# Patient Record
Sex: Male | Born: 1965
Health system: Southern US, Community
[De-identification: ages and names within clinical notes are randomized; demographics above are authoritative.]

## PROBLEM LIST (undated history)

## (undated) DIAGNOSIS — R079 Chest pain, unspecified: Secondary | ICD-10-CM

## (undated) DIAGNOSIS — E785 Hyperlipidemia, unspecified: Secondary | ICD-10-CM

## (undated) DIAGNOSIS — E669 Obesity, unspecified: Secondary | ICD-10-CM

## (undated) DIAGNOSIS — Z87442 Personal history of urinary calculi: Secondary | ICD-10-CM

## (undated) DIAGNOSIS — M1990 Unspecified osteoarthritis, unspecified site: Secondary | ICD-10-CM

## (undated) DIAGNOSIS — I10 Essential (primary) hypertension: Secondary | ICD-10-CM

## (undated) HISTORY — DX: Essential (primary) hypertension: I10

## (undated) HISTORY — DX: Obesity, unspecified: E66.9

## (undated) HISTORY — DX: Hyperlipidemia, unspecified: E78.5

## (undated) HISTORY — PX: UMBILICAL HERNIA REPAIR: SHX196

## (undated) HISTORY — DX: Chest pain, unspecified: R07.9

---

## 1999-11-27 ENCOUNTER — Observation Stay (HOSPITAL_COMMUNITY): Admission: EM | Admit: 1999-11-27 | Discharge: 1999-11-29 | Payer: Self-pay | Admitting: Emergency Medicine

## 2002-05-01 ENCOUNTER — Inpatient Hospital Stay (HOSPITAL_COMMUNITY): Admission: EM | Admit: 2002-05-01 | Discharge: 2002-05-03 | Payer: Self-pay | Admitting: Emergency Medicine

## 2002-05-01 ENCOUNTER — Encounter: Payer: Self-pay | Admitting: Emergency Medicine

## 2002-05-03 ENCOUNTER — Encounter: Payer: Self-pay | Admitting: Internal Medicine

## 2003-07-07 ENCOUNTER — Encounter: Admission: RE | Admit: 2003-07-07 | Discharge: 2003-07-07 | Payer: Self-pay | Admitting: Surgery

## 2003-07-08 ENCOUNTER — Ambulatory Visit (HOSPITAL_BASED_OUTPATIENT_CLINIC_OR_DEPARTMENT_OTHER): Admission: RE | Admit: 2003-07-08 | Discharge: 2003-07-08 | Payer: Self-pay | Admitting: Surgery

## 2003-07-08 ENCOUNTER — Ambulatory Visit (HOSPITAL_COMMUNITY): Admission: RE | Admit: 2003-07-08 | Discharge: 2003-07-08 | Payer: Self-pay | Admitting: Surgery

## 2004-05-25 ENCOUNTER — Emergency Department (HOSPITAL_COMMUNITY): Admission: EM | Admit: 2004-05-25 | Discharge: 2004-05-25 | Payer: Self-pay | Admitting: Emergency Medicine

## 2007-08-03 ENCOUNTER — Emergency Department (HOSPITAL_COMMUNITY): Admission: EM | Admit: 2007-08-03 | Discharge: 2007-08-03 | Payer: Self-pay | Admitting: Emergency Medicine

## 2007-10-15 ENCOUNTER — Emergency Department (HOSPITAL_COMMUNITY): Admission: EM | Admit: 2007-10-15 | Discharge: 2007-10-15 | Payer: Self-pay | Admitting: Emergency Medicine

## 2010-04-23 ENCOUNTER — Emergency Department (HOSPITAL_COMMUNITY): Admission: EM | Admit: 2010-04-23 | Discharge: 2010-04-23 | Payer: Self-pay | Admitting: Emergency Medicine

## 2010-06-05 ENCOUNTER — Emergency Department (HOSPITAL_COMMUNITY)
Admission: EM | Admit: 2010-06-05 | Discharge: 2010-06-05 | Payer: Self-pay | Source: Home / Self Care | Admitting: Emergency Medicine

## 2010-06-17 ENCOUNTER — Encounter
Admission: RE | Admit: 2010-06-17 | Discharge: 2010-07-06 | Payer: Self-pay | Source: Home / Self Care | Attending: Orthopaedic Surgery | Admitting: Orthopaedic Surgery

## 2010-08-16 LAB — URINALYSIS, ROUTINE W REFLEX MICROSCOPIC
Bilirubin Urine: NEGATIVE
Glucose, UA: >1000 mg/dL — AB
Ketones, ur: NEGATIVE mg/dL
Nitrite: NEGATIVE
Protein, ur: 30 mg/dL — AB
Specific Gravity, Urine: 1.016 (ref 1.005–1.030)
Urobilinogen, UA: 0.2 mg/dL (ref 0.0–1.0)
pH: 6.5 (ref 5.0–8.0)

## 2010-08-16 LAB — GLUCOSE, CAPILLARY: Glucose-Capillary: 247 mg/dL — ABNORMAL HIGH (ref 70–99)

## 2010-08-16 LAB — URINE MICROSCOPIC-ADD ON

## 2010-10-22 NOTE — Discharge Summary (Signed)
[place]  Patient:    Martin [name], Martin [name]                     MRN: [id number] Adm. Date:  [id number] Disc. Date: [id number] Attending:  Phifer, Harriett Sine [place] Dictator:   Karlene Einstein, M.D. CC:         Karlene Einstein, M.D.                           Discharge Summary  DISCHARGE DIAGNOSES: 1. Right lower extremity cellulitis. 2. Umbilical hernia.  PROCEDURES:  None.  DISCHARGE MEDICATIONS:  Augmentin 875 mg p.o. q.12h. for 8 days.  CONSULTANTS:  None.  FOLLOW-UP:  Karlene Einstein, M.D., at [facility] on December 07, 1999, at 4:25 p.m., for follow-up cellulitis and hemoglobin level.  HISTORY OF PRESENT ILLNESS:  A 45 year old white male with no past medical history presented with severe nausea, redness, and aching, and heat in his right lower extremity for one day.  Positive fever of 102.5.  No itching. Denies chills, night sweats, headache, shortness of breath, chest pain, abdominal pain, diarrhea, or other complaints.  Patient had two prior episodes in the same area before.  Both resolved within five days with the use of Benadryl and cortisone.  PHYSICAL EXAMINATION:  VITAL SIGNS:  Temperature 100.6, blood pressure 123/70, pulse 88, respiratory rate 20.  GENERAL:  No acute distress.  HEENT:  Normocephalic and atraumatic.  Pupils are equal, round, and reactive to light and accommodation.  Extraocular muscles intact.  Oropharynx clear.  NECK:  Supple.  No lymphadenopathy.  HEART:  Regular rate and rhythm.  Normal S1 and S2.  No murmurs, gallops, or rubs.  ABDOMEN:  Soft, nontender, nondistended, positive bowel sounds.  EXTREMITIES:  No edema.  Diminished pulses.  Right lower extremity anterior cough, warm, erythematous, nontender.  NEUROLOGIC:  Alert and oriented x 3.  No focal deficits.  LABORATORY DATA:  White count 14.4, hemoglobin 14, platelets 207, sodium 134, potassium 3.7, chloride 99, bicarbonate 28,  BUN 11, creatinine 0.8, glucose 110, ANC 12.6, calcium 8.8, PT 13.9, INR 1.2, PTT 27.  HOSPITAL COURSE: #1 - RIGHT LOWER EXTREMITY CELLULITIS:  Patient was started on Ancef 1 g IV q.8h. and he improved over two days.  He was afebrile.  No further nausea.  He was discharged on  Augmentin 875 mg p.o. q.12h. for eight more days and follow up as stated above.  #2 - ANEMIA:  His repeat hemoglobin was 12.9, MCV 83.  He will be followed up in the outpatient clinic for a repeat hemoglobin.  His CMET was normal. DD:  11/29/99 TD:  11/29/99 Job: [id number]

## 2010-10-22 NOTE — Op Note (Signed)
NAME:  Martin [name], Martin [name]                        ACCOUNT NO.:  [id number]   MEDICAL RECORD NO.:  [id number]                   PATIENT TYPE:  AMB   LOCATION:  DSC                                  FACILITY:  [facility]   PHYSICIAN:  Velora Heckler, M.D.                DATE OF BIRTH:  [birth year]   DATE OF PROCEDURE:  07/08/2003  DATE OF DISCHARGE:                                 OPERATIVE REPORT   PREOPERATIVE DIAGNOSIS:  Incarcerated umbilical hernia.   POSTOPERATIVE DIAGNOSIS:  Incarcerated umbilical hernia.   PROCEDURE:  1. Repair incarcerated umbilical hernia with Prolene mesh.  2. Partial omentectomy.   SURGEON:  Velora Heckler, M.D.   ANESTHESIA:  General per Dr. Adonis Huguenin   ESTIMATED BLOOD LOSS:  Minimal.   PREPARATION:  Betadine.   COMPLICATIONS:  None.   INDICATIONS FOR PROCEDURE:  The patient is a 46 year old white male with  long-standing incarcerated umbilical hernia.  This has gradually increased  in size.  It causes him intermittent discomfort.  He has had no signs of  intestinal obstruction.  He now comes to surgery for repair.   DESCRIPTION OF PROCEDURE:  The procedure was done in OR 3 at the [facility].  The patient was brought to the operating room and placed in  a supine position on the operating table.  Following administration of  general anesthesia, the patient was prepped and draped in the usual strict  aseptic fashion.  After ascertaining an adequate level of anesthesia had  been obtained, an infraumbilical incision was made transversely with a #10  blade.  Dissection was carried down through the skin and subcutaneous  tissue.  There is a large hernia sac present.  This contains incarcerated  omentum.  The hernia sac is carefully dissected out using the electrocautery  for hemostasis.  The hernia sac is opened.  A portion of the hernia sac is  allowed to remain adherent to the under surface of the umbilicus so as not  to devascularize a thin  wall umbilicus.  The hernia sac is dissected out to  the level of the fascia.  The fascial defect measures only approximately 3  cm in size.  However, the hernia sac measures up to 15 cm in greatest  dimension.  Portions of the hernia sac are excised using the electrocautery.  The incarcerated omentum is so large as to be unable to reduce back into the  peritoneal cavity without significantly opening the fascial defect.  Therefore, decision was made to proceed with partial omentectomy.  The  omentum is divided between Jeanerette clamps and ligated with 2-0 silk ties.  Approximately 60% of the omentum is resected.  The remaining omentum is then  reduced through the hernia defect after enlarging the hernia defect slightly  by incising the fascia in the midline cephalad.  The omentum is completely  reduced in the peritoneal cavity and  the remaining hernia sac is excised.  The fascial defect is closed with #1 Novafil sutures.  These are simple  interrupted transverse sutures.  Next, the repair is reinforced with a 3 by  6 inch sheet of Prolene mesh.  This is trimmed slightly to the appropriate  dimensions.  It is secured circumferentially with alternating #1 Novafil  sutures and 0 Ethibond sutures.  0 Ethibond sutures are also used to secure  the mesh to the midline suture line closure.  The umbilicus was then  reaffixed to the abdominal wall with interrupted 0 Ethibond suture.  The  subcutaneous tissues are closed with interrupted 3-0 Vicryl sutures.  The  skin was closed with a running 4-0 Vicryl subcuticular sutures.  Benzoin and  Steri-Strips were applied.  Sterile dressings were applied.  The patient was  awakened from anesthesia and brought to the recovery room in stable  condition.  The patient tolerated the procedure well.                                               Velora Heckler, M.D.    TMG/MEDQ  D:  07/08/2003  T:  07/08/2003  Job:  [id number]

## 2010-10-22 NOTE — Discharge Summary (Signed)
NAME:  Martin [name], Martin [name] NO.:  [id number]   MEDICAL RECORD NO.:  [id number]                   PATIENT TYPE:  INP   LOCATION:  5038                                 FACILITY:  [facility]   PHYSICIAN:  Sherin Quarry, MD                   DATE OF BIRTH:  1965-11-22   DATE OF ADMISSION:  05/01/2002  DATE OF DISCHARGE:                                 DISCHARGE SUMMARY   HISTORY:  The patient is a 45 year old man who initially presented to the  [facility] Room on 05/01/02, with acute onset over 48  hours of pain in the right lower extremity, with associated erythema which  extended up to an area above the knee.  Temperature was 104.7.  The patient  appeared lethargic and moderately ill.  Physical examination at the time of  admission is described by Dr. Julio Sicks, the HEENT exam was within normal  limits.  The chest was clear.  Cardiovascular exam revealed normal S1, S2  without rubs, murmurs or gallops.  The abdomen was benign.  There were  normal bowel sounds without masses or tenderness.  There was a 4 cm easily  reducible umbilical hernia.  Neurologic testing was within normal limits.  Examination of the extremities revealed erythema of the right lower  extremity extending from the ankle to the knee without any evidence of edema  or subcutaneous air.  Serial blood cultures were obtained which are negative  to date.  VMET revealed a potassium of 3.4, WBC 19,300, hemoglobin 15.  Urinalysis was within normal limits.  On admission the patient was placed on  Ancef 1 gram IV every 8 hours.  He was administered Dilaudid 2 mg IV every 6  hours p.r.n. for pain.  Initially some concern was raised about a possible  underlying abscess, for this reason the patient was sent for an MRI scan of  the right lower extremity, this was done on 05/03/02.  The MRI scan showed  evidence of cellulitis, but there was no signs of any underlying  osteomyelitis,  myonecrosis or abscess formation.  By 05/03/02, the erythema  had not entirely resolved, but was substantially better.  It was felt  reasonable to go ahead and discharge the patient at that time.  At the time  of discharge I made it very clear to the patient that it was essential that  he take all antibiotic medicines until they were all gone.  I also suggested  that he remain off work until the following Wednesday and suggested that he  try to keep his leg elevated as much as possible.  In the past he had worn  surgical stockings when at work to provide venous support, and given that he  has had cellulitis in his leg now on two separate occasions, I urged him to  resume this practice.   DISCHARGE MEDICATIONS:  1. Augmentin 875  mg p.o. b.i.d. with food.  2. Darvocet 1-2 every 4 hours p.r.n. for pain.   FOLLOWUP:  The patient does not have a primary care doctor, but his wife  apparently sees the doctors at [facility], and I suggested  that they might be a good group of doctors for him to be seen by in the  future.  He promised he will make an appointment with that group of doctors.   DISCHARGE CONDITION:  Good.    DISCHARGE DIAGNOSES:  1. Right lower extremity cellulitis.  2. Obesity.  3. Past history of cellulitis of the leg.                                               Sherin Quarry, MD    SY/MEDQ  D:  05/03/2002  T:  05/03/2002  Job:  [id number]   cc:   Teena Irani. Arlyce Dice, M.D.  [address]  [place]  [place] [address]  Fax: [phone]

## 2010-11-30 DIAGNOSIS — M7989 Other specified soft tissue disorders: Secondary | ICD-10-CM | POA: Insufficient documentation

## 2010-11-30 DIAGNOSIS — M791 Myalgia, unspecified site: Secondary | ICD-10-CM | POA: Insufficient documentation

## 2010-11-30 DIAGNOSIS — IMO0001 Reserved for inherently not codable concepts without codable children: Secondary | ICD-10-CM | POA: Insufficient documentation

## 2010-11-30 DIAGNOSIS — L0390 Cellulitis, unspecified: Secondary | ICD-10-CM

## 2011-03-02 LAB — CBC
Hemoglobin: 13.9
RBC: 4.66
RDW: 12.1

## 2011-03-02 LAB — DIFFERENTIAL
Basophils Absolute: 0
Lymphocytes Relative: 22
Monocytes Absolute: 0.9
Monocytes Relative: 7
Neutro Abs: 7.6
Neutrophils Relative %: 65

## 2011-12-31 ENCOUNTER — Encounter (HOSPITAL_BASED_OUTPATIENT_CLINIC_OR_DEPARTMENT_OTHER): Payer: Self-pay | Admitting: *Deleted

## 2011-12-31 ENCOUNTER — Emergency Department (HOSPITAL_BASED_OUTPATIENT_CLINIC_OR_DEPARTMENT_OTHER): Payer: 59

## 2011-12-31 ENCOUNTER — Emergency Department (HOSPITAL_BASED_OUTPATIENT_CLINIC_OR_DEPARTMENT_OTHER)
Admission: EM | Admit: 2011-12-31 | Discharge: 2012-01-01 | Disposition: A | Discharge status: Home / Self Care | Payer: 59 | Attending: Emergency Medicine | Admitting: Emergency Medicine

## 2011-12-31 DIAGNOSIS — M109 Gout, unspecified: Secondary | ICD-10-CM | POA: Insufficient documentation

## 2011-12-31 DIAGNOSIS — M79609 Pain in unspecified limb: Secondary | ICD-10-CM | POA: Insufficient documentation

## 2011-12-31 DIAGNOSIS — M79605 Pain in left leg: Secondary | ICD-10-CM

## 2011-12-31 DIAGNOSIS — M129 Arthropathy, unspecified: Secondary | ICD-10-CM | POA: Insufficient documentation

## 2011-12-31 DIAGNOSIS — E119 Type 2 diabetes mellitus without complications: Secondary | ICD-10-CM | POA: Insufficient documentation

## 2011-12-31 NOTE — ED Notes (Signed)
Pt. Is in US at present time. 

## 2011-12-31 NOTE — ED Notes (Signed)
Pt describes left leg pain x 3-4 days. No known injury.

## 2011-12-31 NOTE — ED Notes (Signed)
Pt. Reports he was bee stung approx. 2 wks ago on each leg.  Yellow jackets per pt. Stung him.  Pt. Has positive pedal pulse with no edema noted in the L leg or redness noted in the skin on the L leg.

## 2011-12-31 NOTE — ED Notes (Signed)
In to assess Pt.   Pt. Told to put on gown and take pants and shoes and socks off.  No noted resp. Distress noted in Pt.

## 2011-12-31 NOTE — ED Provider Notes (Signed)
History   This chart was scribed for Geoffery Lyons, MD by Shari Heritage. The patient was seen in room MH09/MH09. Patient's care was started at 2041.     CSN: [id number]  Arrival date & time 12/31/11  2041   First MD Initiated Contact with Patient 12/31/11 2148      Chief Complaint  Patient presents with  . Leg Pain    (Consider location/radiation/quality/duration/timing/severity/associated sxs/prior treatment) HPI Martin [name] is a 46 y.o. male who presents to the Emergency Department complaining of moderate left leg pain in his hamstring and calf onset 3 days ago. Patient cannot recall any specific trauma or injury to his leg. Patient says that bearing weight on his leg worsens the pain. Patient denies any new swelling of BLE, tingling or numbness in his left leg. Patient also denies back pain and abdominal pain. 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 Patient with h/o staph infections (2x) and cellulitis in the left leg. He also has a h/o gout and diabetes mellitus (diagnosed 4 years ago). Patient says that he takes Celebrex for arthritis. He has a surgical h/o umbilical hernia repair. Patient has never smoked.    Past Medical History  Diagnosis Date  . Gout   . Diabetes mellitus     Past Surgical History  Procedure Date  . Umbilical hernia repair     History reviewed. No pertinent family history.  History  Substance Use Topics  . Smoking status: Never Smoker   . Smokeless tobacco: Current User    Types: Chew  . Alcohol Use: Yes      Review of Systems A complete 10 system review of systems was obtained and all systems are negative except as noted in the HPI and PMH.   Allergies  Cholestatin  Home Medications   Current Outpatient Rx  Name Route Sig Dispense Refill  . ALFUZOSIN HCL ER 10 MG PO TB24 Oral Take 10 mg by mouth daily.      . ASPIRIN 81 MG PO TABS Oral Take 81 mg by mouth daily.      Marland Kitchen FLAX SEED OIL 1000 MG PO CAPS Oral Take by mouth.        . INSULIN DETEMIR 100 UNIT/ML [place] SOLN Subcutaneous Inject 40 Units into the skin at bedtime.      Marland Kitchen METFORMIN HCL PO Oral Take 1,000 mg by mouth 2 (two) times daily.      . MULTI-VITAMIN PO Oral Take 1 tablet by mouth daily.      . ALEVE PO Oral Take 2 tablets by mouth 2 (two) times daily.        BP 171/91  Pulse 74  Temp 97.5 F (36.4 C) (Oral)  Resp 20  Ht 5\' 11"  (1.803 m)  Wt 275 lb (124.739 kg)  BMI 38.35 kg/m2  SpO2 96%  Physical Exam  Nursing note and vitals reviewed. Constitutional: He is oriented to person, place, and time. He appears well-developed and well-nourished.  HENT:  Head: Normocephalic and atraumatic.  Eyes: Conjunctivae and EOM are normal. Pupils are equal, round, and reactive to light.  Neck: Normal range of motion. Neck supple.  Cardiovascular: Normal rate and regular rhythm.   Pulmonary/Chest: Effort normal and breath sounds normal.  Abdominal: Soft. Bowel sounds are normal.  Musculoskeletal: Normal range of motion.       Lumbar back: He exhibits no tenderness and no pain.       Tenderness to palpation in the left hamstring and  left calf. Slight swelling of the left ankle, but pulses are intact. Negative homan sign.  Neurological: He is alert and oriented to person, place, and time.  Skin: Skin is warm and dry.  Psychiatric: He has a normal mood and affect.    ED Course  Procedures (including critical care time) DIAGNOSTIC STUDIES: Oxygen Saturation is 96% on room air, normal by my interpretation.    COORDINATION OF CARE: 9:51pm- Patient informed of current plan for treatment and evaluation and agrees with plan at this time. Will order an [place] of left leg.  Labs Reviewed - No data to display  US Venous Img Lower Unilateral Left  12/31/2011  *RADIOLOGY REPORT*  Clinical Data: LEFT LEG PAIN  LEFT LOWER EXTREMITY VENOUS DUPLEX ULTRASOUND  Technique: Gray-scale sonography with compression, as well as color and duplex ultrasound, were performed to  evaluate the deep venous system from the level of the common femoral vein through the popliteal and proximal calf veins.  Comparison: None  Findings:  Normal compressibility and normal Doppler signal within the common femoral, superficial femoral and popliteal veins, down to the proximal calf veins. Portions of the superficial femoral vein are poorly visualized.  No grayscale filling defects to suggest DVT. The veins of the calf are poorly visualized, including the posterior tibial and peroneal vein.  Anterior tibial vein not visualized.  IMPRESSION:  No evidence of left lower extremity deep vein thrombosis within the visualized veins as above. There are portions of the SFV that are poorly visualized however demonstrate normal color Doppler flow.  Calf veins are poorly visualized/nondiagnostic.  Original Report Authenticated By: Waneta Martins, M.D.     No diagnosis found.    MDM  The patient presents with pain in the left calf and hamstrings.  This sounds more musculoskeletal to me, however he is quite concerned about a dvt.  An [place] was obtained and was negative.    At this point, he will be discharged with nsaids, rest, and time.  He is quite adamant that this discomfort will prevent him from being able to work the next few days and requests a work note for this.  This was given.  He is to follow up or return prn if he worsens.        I personally performed the services described in this documentation, which was scribed in my presence. The recorded information has been reviewed and considered.      Geoffery Lyons, MD 01/01/12 [phone]

## 2012-05-10 ENCOUNTER — Encounter: Payer: 59 | Attending: Family Medicine | Admitting: Dietician

## 2012-05-10 DIAGNOSIS — Z713 Dietary counseling and surveillance: Secondary | ICD-10-CM | POA: Insufficient documentation

## 2012-05-10 DIAGNOSIS — E119 Type 2 diabetes mellitus without complications: Secondary | ICD-10-CM | POA: Insufficient documentation

## 2012-05-12 ENCOUNTER — Encounter: Payer: Self-pay | Admitting: Dietician

## 2012-05-12 NOTE — Progress Notes (Signed)
  Patient was seen on 05/10/2012 for the first of a series of three diabetes self-management courses at the [facility]. The following learning objectives were met by the patient during this course:   Defines the role of glucose and insulin  Identifies type of diabetes and pathophysiology  Defines the diagnostic criteria for diabetes and prediabetes  States the risk factors for Type 2 Diabetes  States the symptoms of Type 2 Diabetes  Defines Type 2 Diabetes treatment goals  Defines Type 2 Diabetes treatment options  States the rationale for glucose monitoring  Identifies A1C, glucose targets, and testing times  Identifies proper sharps disposal  Defines the purpose of a diabetes food plan  Identifies carbohydrate food groups  Defines effects of carbohydrate foods on glucose levels  Identifies carbohydrate choices/grams/food labels  States benefits of physical activity and effect on glucose  Review of suggested activity guidelines  Handouts given during class include:  Type 2 Diabetes: Basics Book  My Food Plan Book  Food and Activity Log  Patient has established the following initial goals:  Increase exercise  Monitor blood glucose  Lose weight  Follow-Up Plan: Complete DM Core Classes 2&3

## 2012-05-17 ENCOUNTER — Encounter: Payer: 59 | Admitting: Dietician

## 2012-05-17 NOTE — Progress Notes (Signed)
  Patient was seen on 05/17/2012 for the second of a series of three diabetes self-management courses at the [facility]. The following learning objectives were met by the patient during this course:   Explain basic nutrition maintenance and quality assurance  Describe causes, symptoms and treatment of hypoglycemia and hyperglycemia  Explain how to manage diabetes during illness  Describe the importance of good nutrition for health and healthy eating strategies  List strategies to follow meal plan when dining out  Describe the effects of alcohol on glucose and how to use it safely  Describe problem solving skills for day-to-day glucose challenges  Describe strategies to use when treatment plan needs to change  Identify important factors involved in successful weight loss  Describe ways to remain physically active  Describe the impact of regular activity on insulin resistance  Handouts given in class:  Refrigerator magnet for Sick Day Guidelines  [facility] Oral medication/insulin handout  Nutritional Strategies for Weight Loss with Diabetes handout  Follow-Up Plan: Patient will attend the final class of the ADA Diabetes Self-Care Education.

## 2012-05-31 ENCOUNTER — Encounter: Payer: 59 | Admitting: *Deleted

## 2012-05-31 NOTE — Patient Instructions (Signed)
Goals:  Follow Diabetes Meal Plan as instructed  Eat 3 meals and 2 snacks, every 3-5 hrs  Limit carbohydrate intake to 60 grams carbohydrate/meal  Limit carbohydrate intake to 30 grams carbohydrate/snack  Add lean protein foods to meals/snacks  Monitor glucose levels as instructed by your doctor  Aim for 30 mins of physical activity daily  Bring food record and glucose log to your next nutrition visit   

## 2012-05-31 NOTE — Progress Notes (Signed)
  Patient was seen on 05/31/12 for the third of a series of three diabetes self-management courses at the [facility]. The following learning objectives were met by the patient during this course:    Describe how diabetes changes over time   Identify diabetes complications and ways to prevent them   Describe strategies that can promote heart health including lowering blood pressure and cholesterol   Describe strategies to lower dietary fat and sodium in the diet   Identify physical activities that benefit cardiovascular health   Evaluate success in meeting personal goal   Describe the belief that they can live successfully with diabetes day to day   Establish 2-3 goals that they will plan to diligently work on until they return for the free 44-month follow-up visit  The following handouts were given in class:  3 Month Follow Up Visit handout  Goal setting handout  Class evaluation form  Your patient has established the following 3 month goals for diabetes self-care:  Count carbohydrates at most meal and snacks  Take diabetes medications as scheduled  Test my glucose BID  Follow-Up Plan: Patient will attend a 3 month follow-up visit for diabetes self-management education.

## 2012-07-25 ENCOUNTER — Ambulatory Visit (INDEPENDENT_AMBULATORY_CARE_PROVIDER_SITE_OTHER): Payer: 59 | Admitting: Internal Medicine

## 2012-07-25 ENCOUNTER — Encounter: Payer: Self-pay | Admitting: Internal Medicine

## 2012-07-25 VITALS — BP 145/90 | HR 84 | Temp 98.1°F | Resp 16 | Ht 71.0 in | Wt 290.0 lb

## 2012-07-25 DIAGNOSIS — IMO0001 Reserved for inherently not codable concepts without codable children: Secondary | ICD-10-CM

## 2012-07-25 DIAGNOSIS — I10 Essential (primary) hypertension: Secondary | ICD-10-CM | POA: Insufficient documentation

## 2012-07-25 DIAGNOSIS — E785 Hyperlipidemia, unspecified: Secondary | ICD-10-CM | POA: Insufficient documentation

## 2012-07-25 DIAGNOSIS — E1169 Type 2 diabetes mellitus with other specified complication: Secondary | ICD-10-CM | POA: Insufficient documentation

## 2012-07-25 DIAGNOSIS — M109 Gout, unspecified: Secondary | ICD-10-CM | POA: Insufficient documentation

## 2012-07-25 DIAGNOSIS — H409 Unspecified glaucoma: Secondary | ICD-10-CM | POA: Insufficient documentation

## 2012-07-25 MED ORDER — GLUCOSE BLOOD VI STRP: ORAL_STRIP | Status: DC

## 2012-07-25 MED ORDER — LIRAGLUTIDE 18 MG/3ML ~~LOC~~ SOLN: 1.2000 mg | Freq: Every day | SUBCUTANEOUS | Status: DC

## 2012-07-25 NOTE — Patient Instructions (Addendum)
Please return in 2 weeks with your sugar log. Check your sugars twice a day.  Please start Victoza at 0.6 mg in am, after a week increase to 1.2 mg in am. Stop Januvia. Continue Metformin.  Please come on an empty stomach next time to check your cholesterol levels.  Patient instructions for Diabetes mellitus type 2:  DIET AND EXERCISE Diet and exercise is an important part of diabetic treatment.  We recommended aerobic exercise in the form of brisk walking (working between 40-60% of maximal aerobic capacity, similar to brisk walking) for 150 minutes per week (such as 30 minutes five days per week) along with 3 times per week performing 'resistance' training (using various gauge rubber tubes with handles) 5-10 exercises involving the major muscle groups (upper body, lower body and core) performing 10-15 repetitions (or near fatigue) each exercise. Start at half the above goal but build slowly to reach the above goals. If limited by weight, joint pain, or disability, we recommend daily walking in a swimming pool with water up to waist to reduce pressure from joints while allow for adequate exercise.    BLOOD GLUCOSES Monitoring your blood glucoses is important for continued management of your diabetes. Please check your blood glucoses 3-4 times a day: fasting, before meals and at bedtime (you can rotate these measurements - e.g. one day check before the 3 meals, the next day check before 2 of the meals and before bedtime, etc.   HYPOGLYCEMIA (low blood sugar) Hypoglycemia is usually a reaction to not eating, exercising, or taking too much insulin/ other diabetes drugs.  Symptoms include tremors, sweating, hunger, confusion, headache, etc. Treat IMMEDIATELY with 15 grams of Carbs:   4 glucose tablets    cup regular juice/soda   2 tablespoons raisins   4 teaspoons sugar   1 tablespoon honey Recheck blood glucose in 15 mins and repeat above if still symptomatic/blood glucose <100. Please  contact our office at [phone] if you have questions about how to next handle your insulin.  RECOMMENDATIONS TO REDUCE YOUR RISK OF DIABETIC COMPLICATIONS: * Take your prescribed MEDICATION(S). * Follow a DIABETIC diet: Complex carbs, fiber rich foods, heart healthy fish twice weekly, (monounsaturated and polyunsaturated) fats * AVOID saturated/trans fats, high fat foods, >2,300 mg salt per day. * EXERCISE at least 5 times a week for 30 minutes or preferably daily.  * DO NOT SMOKE OR DRINK more than 1 drink a day. * Check your FEET every day. Do not wear tightfitting shoes. Contact us if you develop an ulcer * See your EYE doctor once a year or more if needed * Get a FLU shot once a year * Get a PNEUMONIA vaccine every 5 years and once after age 12 years  GOALS:  * Your Hemoglobin A1c of 7%  * Your Systolic BP should be 130 or lower  * Your Diastolic BP should be 80 or lower  * Your HDL (Good Cholesterol) should be 40 or higher  * Your LDL (Bad Cholesterol) should be 100 or lower  * Your Triglycerides should be 150 or lower  * Your Urine microalbumin (kidney function) of <30 * Your Body Mass Index should be 25 or lower  We will be glad to help you achieve these goals. Our telephone number is: [phone].

## 2012-07-25 NOTE — Progress Notes (Signed)
Subjective:     Patient ID: Martin [name], male   DOB: Feb 11, 1966, 47 y.o.   MRN: [id number]  HPI Martin [name] is a 47 year old man, referred by his PCP, Dr. Creta Levin, in his nutritionist, Denny Levy, for management of DM2, uncontrolled, insulin-dependent, without apparent complications.  Pt was dx with DM2 in 2009, started insulin at dx, initially Levemir - was even on 160 units of basal insulin a day. At the time of diagnosis, his sugars were 650. His last hemoglobin A1c, check on 06/18/2012 was 9.2%. Previously, it was  10.2% in 2013.  He does not check his sugars. Lowest 136. Highest: 270. He has hypoglycemia awareness at <70. He did not have strips to check sugars at home as advised by nutrition. He has a Conservation officer, nature. When he checks in am (once a week): 150-250.  He is on: - Lantus 40 units at night - Metformin 1000 bid - Januvia 100 in am  He works 3rd shift. He works 2 jobs. He works 20 hours a day: 11 pm to 5 pm. He sleeps 3 hours a day since he was 47 y/o. He does maintenance. He is happy about the fact that he took the nutrition classes, and this helped him understand more facts about diabetes and how to eat a little better. Meals are: - breakfast: Eggs, meat, wheat bran - lungs: Salads, sandwich - Dinner: Fish, steak, hamburger, tacos, etc. - Snacks: 3 a day  Denies blurry vision, feeling thirsty or urinating more often.   He sees podiatry - wears Db shoes, last eye exam 01/2012 >> no DR. Has glaucoma, stable. No CKD. No Diabetic neuropathy. He had the flu vaccine in September of last year.  His wife just had a TKR and he was very busy cooking and cleaning and was in and out of the hospital with her.   PMH: - DM2 - HTN - gout - Hyperlipidemia - snoring, without formal diagnosis of OSA  Past surgical history: - Ventral hernia repair in 2012  Allergies: NKDA  FH: He has an extensive family history of diabetes mellitus type 2, in pounds and cousins, but not in  parents. No other family medical history.  Name  Route  Sig   . aspirin 81 MG tablet   Oral   Take 81 mg by mouth daily.      . Celecoxib (CELEBREX PO)   Oral   Take 1 tablet by mouth daily.    . Flaxseed, Linseed, (FLAX SEED OIL) 1000 MG CAPS   Oral   Take by mouth.      Marland Kitchen glucose blood test strip      Use as instructed 2-3 x a day.    . insulin glargine (LANTUS) 100 UNIT/ML injection   Subcutaneous   Inject 40 Units into the skin at bedtime.    Marland Kitchen METFORMIN HCL PO   Oral   Take 1,000 mg by mouth 2 (two) times daily.      . Multiple Vitamin (MULTI-VITAMIN PO)   Oral   Take 1 tablet by mouth daily.      . naproxen sodium (ANAPROX) 220 MG tablet   Oral   Take 220 mg by mouth 3 (three) times daily with meals.    . Rosuvastatin Calcium (CRESTOR PO)   Oral   Take 1 tablet by mouth daily.     Social history: - married, lives with wife, has 2 children of 73 and 2. 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  - does not smoke but chews tobacco, does not smoke cigars or pipes, drinks beer end moonshine seldom - her records, this is more than 3 times a week; does not use any recreational drugs.  - He tries to exercise regularly by using the treadmill and lifting weights.  Review of Systems - all negative Constitutional: no weight gain/loss, no fatigue, no subjective hyperthermia/hypothermia Eyes: no blurry vision, no xerophthalmia ENT: no sore throat, no nodules palpated in throat, no dysphagia/odynophagia, no hoarseness Cardiovascular: no CP/SOB/palpitations/leg swelling Respiratory: no cough/SOB Gastrointestinal: no N/V/D/C Musculoskeletal: no muscle/joint aches Skin: no rashes Neurological: no tremors/numbness/tingling/dizziness Psychiatric: no depression/anxiety     Objective:   Physical Exam BP 145/90  Pulse 84  Temp(Src) 98.1 F (36.7 C) (Oral)  Resp 16  Ht 5\' 11"  (1.803 m)  Wt 290 lb  (131.543 kg)  BMI 40.46 kg/m2  SpO2 95% Constitutional: obese, in NAD Eyes: PERRLA, EOMI, no exophthalmos ENT: moist mucous membranes, no thyromegaly, no cervical lymphadenopathy, Mallampati 4 Cardiovascular: RRR, No MRG Respiratory: CTA B Gastrointestinal: abdomen soft, NT, ND, BS+ Musculoskeletal: no deformities, strength intact in all 4 Skin: moist, warm, no rashes Neurological: no tremor with outstretched hands, DTR normal in all 4   Assessment:     1. DM2, uncontrolled, insulin-dependent, without complications - saw nutrition - sees podiatry      Plan:     1.  The patient has had insulin-dependent diabetes mellitus for the last 5 years, recently more uncontrolled, with the last hemoglobin A1c levels in the 9-10%. His diabetes medical care is limited either fact that he doesn't check his sugars, that he works 20 hours a day, sleeping about 3 hours a day, and by the fact that his food schedule is chaotic. During the winter, he does not have his second job, but he still works nights and sleeps between 6 and 9 PM daily. He says that he has been doing this for 30 years, and can function with this little sleep. He had nutrition classes recently, but from our discussion, I realized that he is unable to change his diet profoundly, since he is not home most of the time. We discussed about possible ways to replace some of the fast foods he eats, and I suggested Lance Muss if he is out of the house, but it would be best if he packs his meals from home. We discussed about some examples of healthy foods. I suggested to cut back on the meat and fat, but he is not open to this, being raised on a farm and eating meat all his life. We also discussed about the fact that he needs to lose weight, which he was able to do in the past, by diet and exercise, when he lost approximately 40 pounds.  - since he tells me that his sugars are better in the morning and increase throughout the day, we will start Victoza and  stop Januvia - We'll maintain the Lantus and metformin dose the same - I strongly advised him to start writing his sugars down, and to start taking twice a day, in a.m. and rotating checks before meals - I sent a prescription for strips to his pharmacy - given sugar log and advised how to fill it and to bring it at next appt - given foot care handout and explained the principles - given instructions for hypoglycemia management "15-15 rule" - I will see the  patient back in 2 weeks with his log - No labs for today, but since he tells me that he would like me to manage his cholesterol levels, we will check a fasting lipid panel at his next visit in 2 weeks. He cannot afford the Crestor, so we will need to change to another statin in at next visit. - I strongly advised him to discuss with Dr. Creta Levin to obtain a sleep study, as he snores and is Mallampati 4 and I believe he has obstructive sleep apnea. Correcting this will help his diabetes, weight problem, and blood pressure.

## 2012-08-10 ENCOUNTER — Ambulatory Visit: Payer: 59 | Admitting: Internal Medicine

## 2012-08-17 ENCOUNTER — Ambulatory Visit (INDEPENDENT_AMBULATORY_CARE_PROVIDER_SITE_OTHER): Payer: 59 | Admitting: Internal Medicine

## 2012-08-17 ENCOUNTER — Encounter: Payer: Self-pay | Admitting: Internal Medicine

## 2012-08-17 VITALS — BP 130/88 | HR 79 | Temp 98.3°F | Resp 10 | Wt 291.0 lb

## 2012-08-17 DIAGNOSIS — E781 Pure hyperglyceridemia: Secondary | ICD-10-CM

## 2012-08-17 DIAGNOSIS — E785 Hyperlipidemia, unspecified: Secondary | ICD-10-CM

## 2012-08-17 DIAGNOSIS — IMO0001 Reserved for inherently not codable concepts without codable children: Secondary | ICD-10-CM

## 2012-08-17 LAB — LIPID PANEL
Cholesterol: 122 mg/dL (ref 0–200)
HDL: 28.4 mg/dL — ABNORMAL LOW (ref >39.00)
Triglycerides: 798 mg/dL — ABNORMAL HIGH (ref 0.0–149.0)

## 2012-08-17 LAB — LDL CHOLESTEROL, DIRECT: Direct LDL: 25.1 mg/dL

## 2012-08-17 MED ORDER — INSULIN GLARGINE 100 UNIT/ML ~~LOC~~ SOLN: 45.0000 [IU] | Freq: Every day | SUBCUTANEOUS | Status: DC

## 2012-08-17 NOTE — Progress Notes (Signed)
Subjective:     Patient ID: Martin [name], male   DOB: 1966-06-06, 47 y.o.   MRN: [id number]  HPI Mr Bougher is a 47 year old man, returning for management of DM2, dx 2009, uncontrolled, insulin-dependent, without apparent complications.  Pt was initially started insulin at dx, initially Levemir - was even on 160 units of basal insulin a day. At the time of diagnosis, his sugars were 650. His last hemoglobin A1c, checked on 06/18/2012 was 9.2%. Previously, it was  10.2% in 2013.   He checks his sugars twice a day.  He brings his log that we reviewed together and it shows that his sugars in the morning are now between 150 and 200, with one value at 130, and 1 at 220. He has just one other check time: 2 hours after lunch and these values are between 140 and 205. Lowest 130 - yesterday 2 hours after lunch, but had another value of 130 in a.m. last week. Highest: 242 (he had one value of 329 immediately after we started the treatment with Victoza, however he is value in the last week are much improved, with sugars lower than 210. He has hypoglycemia awareness at <70.   He is on: - Lantus 40 units at night - Metformin 1000 bid - at last visit we started Victoza, now at 1.2 mg daily and stopped Januvia 100 in am He does not forget his doses.  He works 3rd shift, but has 2 jobs - ends up working 20 hours a day: 11 pm to 5 pm. He sleeps maximum 3 hours a day since he was 47 y/o. He does maintenance. He is happy about the fact that he took the nutrition classes, and this helped him understand more facts about diabetes and how to eat a little better. Meals are lately improved, as per advice from his nutrition classes, and our discussion at last visit. He got away from fast food and now eats flat bread with lunch - which he enjoys, and takes vegetables with him for snack during the night at work. His weight at home is stable.  Denies blurry vision, feeling thirsty or urinating more often.   He sees podiatry  - wears Db shoes, last eye exam 01/2012 >> no DR. Has glaucoma, stable. No CKD. No Diabetic neuropathy. He had the flu vaccine in September of last year.  I reviewed pt's medications, allergies, PMH, social hx, family hx and no changes required.  Review of Systems  - all negative Constitutional: no weight gain/loss, no fatigue, no subjective hyperthermia/hypothermia Eyes: no blurry vision, no xerophthalmia ENT: no sore throat, no nodules palpated in throat, no dysphagia/odynophagia, no hoarseness Cardiovascular: no CP/SOB/palpitations/leg swelling Respiratory: no cough/SOB Gastrointestinal: no N/V/D/C Musculoskeletal: no muscle/joint aches Skin: no rashes Neurological: no tremors/numbness/tingling/dizziness Psychiatric: no depression/anxiety     Objective:   Physical Exam  Wt Readings from Last 3 Encounters:  08/17/12 291 lb (131.997 kg)  07/25/12 290 lb (131.543 kg)  05/12/12 286 lb 6.4 oz (129.91 kg)  Of note, patient was weighed with his jacket and boots on, which weighe at least 5 pounds BP 130/88  Pulse 79  Temp(Src) 98.3 F (36.8 C) (Oral)  Resp 10  Wt 291 lb (131.997 kg)  BMI 40.6 kg/m2  SpO2 96% Constitutional: obese, in NAD Eyes: PERRLA, EOMI, no exophthalmos ENT: moist mucous membranes, no thyromegaly, no cervical lymphadenopathy, Mallampati 4 Cardiovascular: RRR, No MRG Respiratory: CTA B Gastrointestinal: abdomen soft, NT, ND, BS+ Musculoskeletal: no deformities, strength intact in  all 4 Skin: moist, warm, no rashes Neurological: no tremor with outstretched hands, DTR normal in all 4   Assessment:     1. DM2, uncontrolled, insulin-dependent, without complications - saw nutrition - sees podiatry    2. Hyperlipidemia    Plan:     1. DM2 The patient has had insulin-dependent diabetes mellitus for the last 5 years,with the last hemoglobin A1c levels in the 9-10%. His sugars are improved from last time, especially in the last 2 weeks, coinciding with  starting Victoza and the changes that he instituting in his diet. Since his sugars in the morning are still higher than target, at 150-200: - we will continue Victoza at 1.2 mg/day - continue metformin - increase Lantus to 45 units in HS, in an effort to allow him to start the day with lower blood sugars. - I will see the patient back in one month with his log - at next visit, if his sugars throughout the day, we'll increase the dose of Victoza to 1.8 mg daily - I strongly advised him to discuss with Dr. Creta Levin to obtain a sleep study, as he snores and is Mallampati 4 and I believe he has obstructive sleep apnea. Correcting this will help his diabetes, weight problem, and blood pressure. - I let him know that he has an appointment with nutrition coming up on the 27th of this month  2. Hyperlipidemia  - would check a lipid panel today, as the patient is fasting for the last 12 hours except for water and black coffee - He cannot afford the Crestor, so we will need to change to another statin Office Visit on 08/17/2012  Component Date Value Range Status  . Cholesterol 08/17/2012 122  0 - 200 mg/dL Final   ATP III Classification       Desirable:  < 200 mg/dL               Borderline High:  200 - 239 mg/dL          High:  > = 284 mg/dL  . Triglycerides 08/17/2012 798.0* 0.0 - 149.0 mg/dL Final   Normal:  <132 mg/dLBorderline High:  150 - 199 mg/dLTriglyceride is over 400; calculations on Lipids are invalid.  Marland Kitchen HDL 08/17/2012 28.40* >39.00 mg/dL Final  . VLDL 44/06/270 159.6* 0.0 - 40.0 mg/dL Final  . Total CHOL/HDL Ratio 08/17/2012 4   Final                  Men          Women1/2 Average Risk     3.4          3.3Average Risk          5.0          4.42X Average Risk          9.6          7.13X Average Risk          15.0          11.0                      . Direct LDL 08/17/2012 25.1   Final   Optimal:  <100 mg/dLNear or Above Optimal:  100-129 mg/dLBorderline High:  130-159 mg/dLHigh:  160-189  mg/dLVery High:  >190 mg/dL   I advised him to start Lovaza 1 g bid, will advance to 2 g bid depending on next lipid panel (in ~3 mo).

## 2012-08-17 NOTE — Patient Instructions (Addendum)
Please increase the Lantus dose to 45 units. Please return in 1 month with your sugar log.

## 2012-08-20 ENCOUNTER — Telehealth: Payer: Self-pay | Admitting: *Deleted

## 2012-08-20 DIAGNOSIS — E781 Pure hyperglyceridemia: Secondary | ICD-10-CM | POA: Insufficient documentation

## 2012-08-20 MED ORDER — OMEGA-3-ACID ETHYL ESTERS 1 G PO CAPS
1.0000 g | ORAL_CAPSULE | Freq: Two times a day (BID) | ORAL | Status: DC
Start: 2012-08-20 — End: 2013-04-23

## 2012-08-20 NOTE — Telephone Encounter (Signed)
Called pt and lvm that his triglycerides were high and Dr Elvera Lennox wants him to discontinue taking the flaxseed oil, if he is still taking it. Also, she want him to begin taking Lovaza 1mg  tablets bid with meals. She has already sent this to his pharmacy. She will recheck his lipids at his follow up appt. If he has any questions he can call our office.

## 2012-08-31 ENCOUNTER — Ambulatory Visit: Payer: 59 | Admitting: *Deleted

## 2012-09-17 ENCOUNTER — Ambulatory Visit: Payer: 59 | Admitting: Internal Medicine

## 2012-09-28 ENCOUNTER — Ambulatory Visit: Payer: 59 | Admitting: *Deleted

## 2012-10-01 ENCOUNTER — Ambulatory Visit: Payer: 59 | Admitting: Internal Medicine

## 2012-10-26 ENCOUNTER — Ambulatory Visit: Payer: 59 | Admitting: Internal Medicine

## 2012-10-26 DIAGNOSIS — Z0289 Encounter for other administrative examinations: Secondary | ICD-10-CM

## 2012-11-08 ENCOUNTER — Ambulatory Visit (INDEPENDENT_AMBULATORY_CARE_PROVIDER_SITE_OTHER): Payer: 59 | Admitting: Internal Medicine

## 2012-11-08 ENCOUNTER — Encounter: Payer: Self-pay | Admitting: Internal Medicine

## 2012-11-08 VITALS — BP 134/76 | HR 76 | Ht 70.0 in | Wt 284.0 lb

## 2012-11-08 DIAGNOSIS — E781 Pure hyperglyceridemia: Secondary | ICD-10-CM

## 2012-11-08 DIAGNOSIS — IMO0001 Reserved for inherently not codable concepts without codable children: Secondary | ICD-10-CM

## 2012-11-08 LAB — MICROALBUMIN / CREATININE URINE RATIO
Creatinine,U: 137.1 mg/dL
Microalb, Ur: 11 mg/dL — ABNORMAL HIGH (ref 0.0–1.9)

## 2012-11-08 LAB — LIPID PANEL
HDL: 31.3 mg/dL — ABNORMAL LOW (ref >39.00)
Total CHOL/HDL Ratio: 4

## 2012-11-08 NOTE — Patient Instructions (Signed)
Please return in 3 months with your sugar log.  Keep up the great work! Please join My Chart - send me the name of your new cholesterol medication.

## 2012-11-08 NOTE — Progress Notes (Signed)
Subjective:     Patient ID: Martin [name], male   DOB: June 05, 1966, 47 y.o.   MRN: [id number]  HPIMr Kazee is a 47 year old man, returning for management of DM2, dx 2009, uncontrolled, insulin-dependent, without apparent complications and hypertriglyceridemia. Last visit 2.5 mo ago. He missed last appt with me.  DM 2: Pt was initially started insulin at dx, initially Levemir - was even on 160 units of basal insulin a day. At the time of diagnosis, his sugars were 650. His last hemoglobin A1c, checked on 06/18/2012 was 9.2%. Previously, it was 10.2% in 2013.   He checks his sugars 2x a day. He brings his log that we reviewed together: - am: 150-200 (with one value at 130, and 1 at 220) >> now 135-190 - 2 hours after lunch: 140-205 (Lowest 130, highest: 242) >> now 120-180 He has hypoglycemia awareness at <70.   He is on: - Lantus 45 (increased from 40 units at night at last visit) - Metformin 1000 bid - Victoza 1.8 mg daily  We stopped Januvia 100 at our first visit. He does not forget his doses.   Hypertriglyceridemia: Patient also has hypertriglyceridemia, ~800 this checked at last visit. At that time, a side dietary changes, I suggested that he comes off his flaxseed oil and start Lovaza 1 g twice a day. He is taking this dose every day.  Denies blurry vision, feeling thirsty or urinating more often.   He sees podiatry - wears Db shoes, last eye exam 01/2012 Hyacinth Meeker) >> no DR. Has glaucoma, stable. He is working on scheduling an appointment with his podiatrist and ophthalmologist. No CKD. No Diabetic neuropathy.   He has been even more busy at work recently. He was having at last visit that he works 3rd shift, but has 2 jobs - ends up working 20 hours a day: 11 pm to 5 pm. He sleeps maximum 3 hours a day since he was 47 y/o. He does maintenance.   He took nutrition classes, and they helped and he is now eating better and was able to lose 7 pounds since last visit! Meals are lately  improved, he got away from fast food and now eats flat bread with lunch - which he enjoys, and takes vegetables with him for snack during the night at work. He is also exercising as frequently as he can especially by swimming (he aims to do 20 laps everyday).  I reviewed pt's medications, allergies, PMH, social hx, family hx and no changes required, other than adding another statin instead of Crestor, which he could not afford. He was sent me a message about the type and dose  Of this packing as it does not appear on his medication list.   Review of Systems  Constitutional: + weight loss, no fatigue, no subjective hyperthermia/hypothermia Eyes: no blurry vision, no xerophthalmia ENT: no sore throat, no nodules palpated in throat, no dysphagia/odynophagia, no hoarseness Cardiovascular: no CP/SOB/palpitations/leg swelling Respiratory: no cough/SOB Gastrointestinal: no N/V/D/C Musculoskeletal: no muscle/joint aches Skin: no rashes Neurological: no tremors/numbness/tingling/dizziness Psychiatric: no depression/anxiety Low libido    Objective:   Physical Exam Wt Readings from Last 3 Encounters:  11/08/12 284 lb (128.822 kg)  08/17/12 291 lb (131.997 kg)  07/25/12 290 lb (131.543 kg)   BP 134/76  Pulse 76  Ht 5\' 10"  (1.778 m)  Wt 284 lb (128.822 kg)  BMI 40.75 kg/m2  SpO2 98% Constitutional: obese, in NAD Eyes: PERRLA, EOMI, no exophthalmos ENT: moist mucous membranes, no thyromegaly, no  cervical lymphadenopathy, Mallampati 4 Cardiovascular: RRR, No MRG Respiratory: CTA B Gastrointestinal: abdomen soft, NT, ND, BS+ Musculoskeletal: no deformities, strength intact in all 4 Skin: moist, warm, no rashes Neurological: no tremor with outstretched hands, DTR normal in all 4   Assessment:     1. DM2, uncontrolled, insulin-dependent, without complications - saw nutrition - sees podiatry    2. Hyperlipidemia - TG 798 >> started Lovaza 1 g bid 08/2012 - on statin - could not afford  Crestor    Plan:     1. DM2 The patient has had insulin-dependent diabetes mellitus for the last 5 years, with the last hemoglobin A1c levels in the 9-10%. His sugars are improved especially after adding Victoza and changing his diet. He does not bring a sugar log today, but per his recall, he still has some high sugars both in the morning and 2 hours after lunch.There is no upward trend throughout the day, but he does not check later in the day. - we will continue Victoza at 1.8 mg/day - continue metformin thousand milligrams twice a day - continue Lantus 45 units in HS, in an effort to allow him to start the day with lower blood sugars. - since his sugars are still not all of them at goal, we discussed about adding Invokana, which would help with both fasting and postprandial sugars. He would like to wait for the hemoglobin A1c to come back in and see if he actually needs to do that. I agreed to with this, we can also give him more chance to work on his diet, exercise, and weight loss, since he is doing a great job with this. - I gave him new sugar logs and I advised him to rotate check times - he can continue to check twice a day - I will see the patient back in 3 months with his log  2. Hyperlipidemia  - we checked a lipid panel at last visit, as the patient was fasting for 12 hours except for water and black coffee. The triglycerides returned at 798, with a low HDL of 28.4. I suggested he comes off his flax oil and starts Lovaza 1 g twice a day. He was taken this in the last 2.5 months, and we should continue it for now. Will recheck lipids today. He is, again, fasting. I also advised him to let me know the name of the statin that he changed to, as he could not afford Crestor.  Office Visit on 11/08/2012  Component Date Value Range Status  . Hemoglobin A1C 11/08/2012 12.0* 4.6 - 6.5 % Final  . Cholesterol 11/08/2012 125  0 - 200 mg/dL Final  . Triglycerides 11/08/2012 478.0 * 0.0 - 149.0  mg/dL Final  . HDL 09/81/1914 31.30* >39.00 mg/dL Final  . VLDL 78/29/5621 95.6* 0.0 - 40.0 mg/dL Final  . Total CHOL/HDL Ratio 11/08/2012 4   Final  . Microalb, Ur 11/08/2012 11.0* 0.0 - 1.9 mg/dL Final  . Creatinine,U 30/86/5784 137.1   Final  . Microalb Creat Ratio 11/08/2012 8.0  0.0 - 30.0 mg/g Final  . Direct LDL 11/08/2012 38.4   Final   HbA1C much worse, unexpected, based on sugars. Advised pt (called and left msg) to increase Lantus to 50 units and to call and schedule an appt in 1 month rather than 3 - needs to come with sugar log.  TG improved. Continue Lovaza.

## 2013-02-07 ENCOUNTER — Ambulatory Visit: Payer: 59 | Admitting: Internal Medicine

## 2013-04-23 ENCOUNTER — Other Ambulatory Visit: Payer: Self-pay | Admitting: Internal Medicine

## 2013-04-23 ENCOUNTER — Other Ambulatory Visit: Payer: Self-pay | Admitting: *Deleted

## 2013-04-23 DIAGNOSIS — E781 Pure hyperglyceridemia: Secondary | ICD-10-CM

## 2013-04-23 DIAGNOSIS — IMO0001 Reserved for inherently not codable concepts without codable children: Secondary | ICD-10-CM

## 2013-04-23 DIAGNOSIS — E785 Hyperlipidemia, unspecified: Secondary | ICD-10-CM

## 2013-04-23 MED ORDER — OMEGA-3-ACID ETHYL ESTERS 1 G PO CAPS: 1.0000 g | ORAL_CAPSULE | Freq: Two times a day (BID) | ORAL | Status: DC

## 2013-04-23 NOTE — Telephone Encounter (Signed)
Refill

## 2013-08-07 ENCOUNTER — Other Ambulatory Visit: Payer: Self-pay | Admitting: Internal Medicine

## 2013-10-05 ENCOUNTER — Emergency Department (HOSPITAL_BASED_OUTPATIENT_CLINIC_OR_DEPARTMENT_OTHER)
Admission: EM | Admit: 2013-10-05 | Discharge: 2013-10-06 | Disposition: A | Discharge status: Home / Self Care | Payer: 59 | Attending: Emergency Medicine | Admitting: Emergency Medicine

## 2013-10-05 ENCOUNTER — Encounter (HOSPITAL_BASED_OUTPATIENT_CLINIC_OR_DEPARTMENT_OTHER): Payer: Self-pay | Admitting: Emergency Medicine

## 2013-10-05 DIAGNOSIS — Z794 Long term (current) use of insulin: Secondary | ICD-10-CM | POA: Insufficient documentation

## 2013-10-05 DIAGNOSIS — M5430 Sciatica, unspecified side: Secondary | ICD-10-CM | POA: Insufficient documentation

## 2013-10-05 DIAGNOSIS — Z79899 Other long term (current) drug therapy: Secondary | ICD-10-CM | POA: Insufficient documentation

## 2013-10-05 DIAGNOSIS — M109 Gout, unspecified: Secondary | ICD-10-CM | POA: Insufficient documentation

## 2013-10-05 DIAGNOSIS — Z7982 Long term (current) use of aspirin: Secondary | ICD-10-CM | POA: Insufficient documentation

## 2013-10-05 DIAGNOSIS — E119 Type 2 diabetes mellitus without complications: Secondary | ICD-10-CM | POA: Insufficient documentation

## 2013-10-05 DIAGNOSIS — Z791 Long term (current) use of non-steroidal anti-inflammatories (NSAID): Secondary | ICD-10-CM | POA: Insufficient documentation

## 2013-10-05 DIAGNOSIS — E669 Obesity, unspecified: Secondary | ICD-10-CM | POA: Insufficient documentation

## 2013-10-05 LAB — CBG MONITORING, ED: Glucose-Capillary: 150 mg/dL — ABNORMAL HIGH (ref 70–99)

## 2013-10-05 MED ORDER — HYDROMORPHONE HCL PF 1 MG/ML IJ SOLN
1.0000 mg | Freq: Once | INTRAMUSCULAR | Status: AC
  Administered 2013-10-05: 1 mg via INTRAVENOUS
  Filled 2013-10-05: qty 1

## 2013-10-05 MED ORDER — METHOCARBAMOL 1000 MG/10ML IJ SOLN
1000.0000 mg | Freq: Once | INTRAMUSCULAR | Status: AC
  Administered 2013-10-05: 1000 mg via INTRAVENOUS

## 2013-10-05 MED ORDER — FENTANYL CITRATE 0.05 MG/ML IJ SOLN
50.0000 ug | INTRAMUSCULAR | Status: AC | PRN
  Administered 2013-10-05 (×4): 50 ug via INTRAVENOUS
  Filled 2013-10-05 (×4): qty 2

## 2013-10-05 MED ORDER — KETOROLAC TROMETHAMINE 30 MG/ML IJ SOLN
30.0000 mg | Freq: Once | INTRAMUSCULAR | Status: AC
  Administered 2013-10-05: 30 mg via INTRAVENOUS
  Filled 2013-10-05: qty 1

## 2013-10-05 MED ORDER — ONDANSETRON HCL 4 MG/2ML IJ SOLN
4.0000 mg | Freq: Once | INTRAMUSCULAR | Status: AC
  Administered 2013-10-05: 4 mg via INTRAVENOUS
  Filled 2013-10-05: qty 2

## 2013-10-05 MED ORDER — GABAPENTIN 100 MG PO CAPS: ORAL_CAPSULE | ORAL | Status: DC

## 2013-10-05 MED ORDER — GABAPENTIN 300 MG PO CAPS
500.0000 mg | ORAL_CAPSULE | Freq: Once | ORAL | Status: AC
  Administered 2013-10-05: 500 mg via ORAL
  Filled 2013-10-05: qty 1
  Filled 2013-10-05: qty 2

## 2013-10-05 MED ORDER — METHOCARBAMOL 1000 MG/10ML IJ SOLN
1000.0000 mg | Freq: Once | INTRAMUSCULAR | Status: DC
  Filled 2013-10-05: qty 10

## 2013-10-05 MED ORDER — IBUPROFEN 800 MG PO TABS: 800.0000 mg | ORAL_TABLET | Freq: Three times a day (TID) | ORAL | Status: DC | PRN

## 2013-10-05 MED ORDER — GABAPENTIN 300 MG PO CAPS: 300.0000 mg | ORAL_CAPSULE | Freq: Three times a day (TID) | ORAL | Status: DC

## 2013-10-05 NOTE — ED Notes (Signed)
History of chronic back pain.  Mowed the yard today and c/o pain down left hip/leg after getting off the riding mower.  Patient was brought here today in the bed of a pick up truck because he was unable to sit.

## 2013-10-05 NOTE — ED Notes (Signed)
Patient states that he "just doesn't think this dose" of fentanyl is "going to do it".

## 2013-10-05 NOTE — ED Notes (Signed)
Assumed care of patient from Sylvia, RN. 

## 2013-10-05 NOTE — ED Provider Notes (Signed)
CSN: [id number]     Arrival date & time 10/05/13  1610 History  This chart was scribed for Nelia Shiobert L Alli Jasmer, MD by Ardelia Memsylan Malpass, ED Scribe. This patient was seen in room MH11/MH11 and the patient's care was started at 4:58 PM.   Chief Complaint  Patient presents with  . Back Pain    The history is provided by the patient. No language interpreter was used.    HPI Comments: Martin [name] is a 48 y.o. male with a history of DM who presents to the Emergency Department complaining of intermittent, worsening, moderate-to-severe pain that begins in his left hip area and radiates posteriorly down his left leg over the past 3 weeks. He states that his pain worsened today while he was playing golf . He states that his pain is worsened with sitting up. He states that he saw a specialist yesterday and was diagnosed with sciatica and was given prescriptions for Hydrocodone and a 5-day course of Prednisone. He started on Prednisone yesterday and he took 5 mg three times today. He states that he last took Aleve 2 days ago. He reports that all of these medications have offered minimal relief. He also states that he has been applying ice with minimal relief. He states that he has not been given a prescription for a muscle relaxant. He denies any other pain or symptoms.    Past Medical History  Diagnosis Date  . Gout   . Diabetes mellitus   . Obesity    Past Surgical History  Procedure Laterality Date  . Umbilical hernia repair     Family History  Problem Relation Age of Onset  . Kidney disease Other   . Diabetes Maternal Grandfather    History  Substance Use Topics  . Smoking status: Never Smoker   . Smokeless tobacco: Current User    Types: Chew  . Alcohol Use: Yes    Review of Systems  Musculoskeletal: Positive for back pain.  All other systems reviewed and are negative.   Allergies  Review of patient's allergies indicates no known allergies.  Home Medications   Prior to Admission  medications   Medication Sig Start Date End Date Taking? Authorizing Provider  atorvastatin (LIPITOR) 80 MG tablet Take 80 mg by mouth daily.   Yes Historical Provider, MD  fenofibrate 160 MG tablet Take 160 mg by mouth daily.   Yes Historical Provider, MD  HYDROcodone-acetaminophen (NORCO/VICODIN) 5-325 MG per tablet Take 2 tablets by mouth every 6 (six) hours as needed for moderate pain.   Yes Historical Provider, MD  lisinopril (PRINIVIL,ZESTRIL) 10 MG tablet Take 10 mg by mouth daily.   Yes Historical Provider, MD  prednisoLONE 5 MG TABS tablet Take by mouth. Dose Pack   Yes Historical Provider, MD  aspirin 81 MG tablet Take 81 mg by mouth daily.      Historical Provider, MD  Celecoxib (CELEBREX PO) Take 1 tablet by mouth daily.    Historical Provider, MD  insulin glargine (LANTUS) 100 UNIT/ML injection Inject 45 Units into the skin at bedtime. 08/17/12   Carlus Pavlovristina Gherghe, MD  Liraglutide (VICTOZA) 18 MG/3ML SOLN injection Inject 0.2 mLs (1.2 mg total) into the skin daily. 07/25/12   Carlus Pavlovristina Gherghe, MD  METFORMIN HCL PO Take 1,000 mg by mouth 2 (two) times daily.      Historical Provider, MD  Multiple Vitamin (MULTI-VITAMIN PO) Take 1 tablet by mouth daily.      Historical Provider, MD  naproxen sodium (ANAPROX) 220  MG tablet Take 220 mg by mouth 3 (three) times daily with meals.    Historical Provider, MD  omega-3 acid ethyl esters (LOVAZA) 1 G capsule Take 1 capsule (1 g total) by mouth 2 (two) times daily. With meals 04/23/13   Carlus Pavlovristina Gherghe, MD  [facility] VER[facility] test strip USE AS INSTRUCTED 2-3 X A DAY. 08/07/13   Carlus Pavlovristina Gherghe, MD  Rosuvastatin Calcium (CRESTOR PO) Take 1 tablet by mouth daily.    Historical Provider, MD   Triage Vitals: BP 180/74  Pulse 96  Temp(Src) 97.6 F (36.4 C) (Oral)  Resp 20  Ht 5\' 11"  (1.803 m)  Wt 252 lb (114.306 kg)  BMI 35.16 kg/m2  SpO2 97%  Physical Exam  Nursing note and vitals reviewed. Constitutional: He is oriented to person, place, and  time. He appears well-developed and well-nourished. No distress.  HENT:  Head: Normocephalic and atraumatic.  Eyes: Pupils are equal, round, and reactive to light.  Neck: Normal range of motion.  Cardiovascular: Normal rate and intact distal pulses.   Pulmonary/Chest: No respiratory distress.  Abdominal: Normal appearance. He exhibits no distension.  Musculoskeletal: Normal range of motion.       Back:  Neurological: He is alert and oriented to person, place, and time. No cranial nerve deficit.  Skin: Skin is warm and dry. No rash noted.  Psychiatric: He has a normal mood and affect. His behavior is normal.    ED Course  Procedures (including critical care time)  DIAGNOSTIC STUDIES: Oxygen Saturation is 97% on RA, normal by my interpretation.    COORDINATION OF CARE: 5:05 PM- Discussed plan for pain management. Pt advised of plan for treatment and pt agrees.  Medications  HYDROmorphone (DILAUDID) injection 1 mg (1 mg Intravenous Given 10/05/13 1755)  ondansetron (ZOFRAN) injection 4 mg (4 mg Intravenous Given 10/05/13 1755)  ketorolac (TORADOL) 30 MG/ML injection 30 mg (30 mg Intravenous Given 10/05/13 1755)  HYDROmorphone (DILAUDID) injection 1 mg (1 mg Intravenous Given 10/05/13 1915)  fentaNYL (SUBLIMAZE) injection 50 mcg (50 mcg Intravenous Given 10/05/13 2157)  methocarbamol (ROBAXIN) injection 1,000 mg (1,000 mg Intravenous Given 10/05/13 2009)  gabapentin (NEURONTIN) capsule 500 mg (500 mg Oral Given 10/05/13 2238)   Labs Review Labs Reviewed  CBG MONITORING, ED - Abnormal; Notable for the following:    Glucose-Capillary 150 (*)    All other components within normal limits    Imaging Review No results found.  After treatment in the ED the patient feels back to baseline and wants to go home.  MDM   Final diagnoses:  Sciatica      I personally performed the services described in this documentation, which was scribed in my presence. The recorded information has been  reviewed and considered.   Nelia Shiobert L Abid Bolla, MD 10/05/13 [id number]

## 2013-10-05 NOTE — Discharge Instructions (Signed)
Take 300-500 mg of Neurontin 3 times a day along with 800 mg ibuprofen Neuropathic Pain We often think that pain has a physical cause. If we get rid of the cause, the pain should go away. Nerves themselves can also cause pain. It is called neuropathic pain, which means nerve abnormality. It may be difficult for the patients who have it and for the treating caregivers. Pain is usually described as acute (short-lived) or chronic (long-lasting). Acute pain is related to the physical sensations caused by an injury. It can last from a few seconds to many weeks, but it usually goes away when normal healing occurs. Chronic pain lasts beyond the typical healing time. With neuropathic pain, the nerve fibers themselves may be damaged or injured. They then send incorrect signals to other pain centers. The pain you feel is real, but the cause is not easy to find.  CAUSES  Chronic pain can result from diseases, such as diabetes and shingles (an infection related to chickenpox), or from trauma, surgery, or amputation. It can also happen without any known injury or disease. The nerves are sending pain messages, even though there is no identifiable cause for such messages.   Other common causes of neuropathy include diabetes, phantom limb pain, or Regional Pain Syndrome (RPS).  As with all forms of chronic back pain, if neuropathy is not correctly treated, there can be a number of associated problems that lead to a downward cycle for the patient. These include depression, sleeplessness, feelings of fear and anxiety, limited social interaction and inability to do normal daily activities or work.  The most dramatic and mysterious example of neuropathic pain is called "phantom limb syndrome." This occurs when an arm or a leg has been removed because of illness or injury. The brain still gets pain messages from the nerves that originally carried impulses from the missing limb. These nerves now seem to misfire and cause  troubling pain.  Neuropathic pain often seems to have no cause. It responds poorly to standard pain treatment. Neuropathic pain can occur after:  Shingles (herpes zoster virus infection).  A lasting burning sensation of the skin, caused usually by injury to a peripheral nerve.  Peripheral neuropathy which is widespread nerve damage, often caused by diabetes or alcoholism.  Phantom limb pain following an amputation.  Facial nerve problems (trigeminal neuralgia).  Multiple sclerosis.  Reflex sympathetic dystrophy.  Pain which comes with cancer and cancer chemotherapy.  Entrapment neuropathy such as when pressure is put on a nerve such as in carpal tunnel syndrome.  Back, leg, and hip problems (sciatica).  Spine or back surgery.  HIV Infection or AIDS where nerves are infected by viruses. Your caregiver can explain items in the above list which may apply to you. SYMPTOMS  Characteristics of neuropathic pain are:  Severe, sharp, electric shock-like, shooting, lightening-like, knife-like.  Pins and needles sensation.  Deep burning, deep cold, or deep ache.  Persistent numbness, tingling, or weakness.  Pain resulting from light touch or other stimulus that would not usually cause pain.  Increased sensitivity to something that would normally cause pain, such as a pinprick. Pain may persist for months or years following the healing of damaged tissues. When this happens, pain signals no longer sound an alarm about current injuries or injuries about to happen. Instead, the alarm system itself is not working correctly.  Neuropathic pain may get worse instead of better over time. For some people, it can lead to serious disability. It is important to be  aware that severe injury in a limb can occur without a proper, protective pain response.Burns, cuts, and other injuries may go unnoticed. Without proper treatment, these injuries can become infected or lead to further disability. Take  any injury seriously, and consult your caregiver for treatment. DIAGNOSIS  When you have a pain with no known cause, your caregiver will probably ask some specific questions:   Do you have any other conditions, such as diabetes, shingles, multiple sclerosis, or HIV infection?  How would you describe your pain? (Neuropathic pain is often described as shooting, stabbing, burning, or searing.)  Is your pain worse at any time of the day? (Neuropathic pain is usually worse at night.)  Does the pain seem to follow a certain physical pathway?  Does the pain come from an area that has missing or injured nerves? (An example would be phantom limb pain.)  Is the pain triggered by minor things such as rubbing against the sheets at night? These questions often help define the type of pain involved. Once your caregiver knows what is happening, treatment can begin. Anticonvulsant, antidepressant drugs, and various pain relievers seem to work in some cases. If another condition, such as diabetes is involved, better management of that disorder may relieve the neuropathic pain.  TREATMENT  Neuropathic pain is frequently long-lasting and tends not to respond to treatment with narcotic type pain medication. It may respond well to other drugs such as antiseizure and antidepressant medications. Usually, neuropathic problems do not completely go away, but partial improvement is often possible with proper treatment. Your caregivers have large numbers of medications available to treat you. Do not be discouraged if you do not get immediate relief. Sometimes different medications or a combination of medications will be tried before you receive the results you are hoping for. See your caregiver if you have pain that seems to be coming from nowhere and does not go away. Help is available.  SEEK IMMEDIATE MEDICAL CARE IF:   There is a sudden change in the quality of your pain, especially if the change is on only one side of  the body.  You notice changes of the skin, such as redness, black or purple discoloration, swelling, or an ulcer.  You cannot move the affected limbs. Document Released: 02/18/2004 Document Revised: 08/15/2011 Document Reviewed: 02/18/2004 [facility] Patient Information 2014 [place], [place].  Sciatica Sciatica is pain, weakness, numbness, or tingling along the path of the sciatic nerve. The nerve starts in the lower back and runs down the back of each leg. The nerve controls the muscles in the lower leg and in the back of the knee, while also providing sensation to the back of the thigh, lower leg, and the sole of your foot. Sciatica is a symptom of another medical condition. For instance, nerve damage or certain conditions, such as a herniated disk or bone spur on the spine, pinch or put pressure on the sciatic nerve. This causes the pain, weakness, or other sensations normally associated with sciatica. Generally, sciatica only affects one side of the body. CAUSES   Herniated or slipped disc.  Degenerative disk disease.  A pain disorder involving the narrow muscle in the buttocks (piriformis syndrome).  Pelvic injury or fracture.  Pregnancy.  Tumor (rare). SYMPTOMS  Symptoms can vary from mild to very severe. The symptoms usually travel from the low back to the buttocks and down the back of the leg. Symptoms can include:  Mild tingling or dull aches in the lower back, leg, or  hip.  Numbness in the back of the calf or sole of the foot.  Burning sensations in the lower back, leg, or hip.  Sharp pains in the lower back, leg, or hip.  Leg weakness.  Severe back pain inhibiting movement. These symptoms may get worse with coughing, sneezing, laughing, or prolonged sitting or standing. Also, being overweight may worsen symptoms. DIAGNOSIS  Your caregiver will perform a physical exam to look for common symptoms of sciatica. He or she may ask you to do certain movements or activities  that would trigger sciatic nerve pain. Other tests may be performed to find the cause of the sciatica. These may include:  Blood tests.  X-rays.  Imaging tests, such as an MRI or CT scan. TREATMENT  Treatment is directed at the cause of the sciatic pain. Sometimes, treatment is not necessary and the pain and discomfort goes away on its own. If treatment is needed, your caregiver may suggest:  Over-the-counter medicines to relieve pain.  Prescription medicines, such as anti-inflammatory medicine, muscle relaxants, or narcotics.  Applying heat or ice to the painful area.  Steroid injections to lessen pain, irritation, and inflammation around the nerve.  Reducing activity during periods of pain.  Exercising and stretching to strengthen your abdomen and improve flexibility of your spine. Your caregiver may suggest losing weight if the extra weight makes the back pain worse.  Physical therapy.  Surgery to eliminate what is pressing or pinching the nerve, such as a bone spur or part of a herniated disk. HOME CARE INSTRUCTIONS   Only take over-the-counter or prescription medicines for pain or discomfort as directed by your caregiver.  Apply ice to the affected area for 20 minutes, 3 4 times a day for the first 48 72 hours. Then try heat in the same way.  Exercise, stretch, or perform your usual activities if these do not aggravate your pain.  Attend physical therapy sessions as directed by your caregiver.  Keep all follow-up appointments as directed by your caregiver.  Do not wear high heels or shoes that do not provide proper support.  Check your mattress to see if it is too soft. A firm mattress may lessen your pain and discomfort. SEEK IMMEDIATE MEDICAL CARE IF:   You lose control of your bowel or bladder (incontinence).  You have increasing weakness in the lower back, pelvis, buttocks, or legs.  You have redness or swelling of your back.  You have a burning sensation when  you urinate.  You have pain that gets worse when you lie down or awakens you at night.  Your pain is worse than you have experienced in the past.  Your pain is lasting longer than 4 weeks.  You are suddenly losing weight without reason. MAKE SURE YOU:  Understand these instructions.  Will watch your condition.  Will get help right away if you are not doing well or get worse. Document Released: 05/17/2001 Document Revised: 11/22/2011 Document Reviewed: 10/02/2011 [facility] Patient Information 2014 [place], [place].

## 2013-10-05 NOTE — ED Notes (Signed)
Patient has been treated for arthritis in his lower back for the past 3 weeks. Taking prednisone and vicodin currently. Mowed the grass this morning and began having more severe pain. Called the dr office and they told the pt to go the ER and suggested a toradol shot.

## 2014-02-06 ENCOUNTER — Other Ambulatory Visit: Payer: Self-pay | Admitting: Internal Medicine

## 2014-03-28 ENCOUNTER — Encounter (HOSPITAL_BASED_OUTPATIENT_CLINIC_OR_DEPARTMENT_OTHER): Payer: Self-pay | Admitting: Emergency Medicine

## 2014-03-28 ENCOUNTER — Emergency Department (HOSPITAL_BASED_OUTPATIENT_CLINIC_OR_DEPARTMENT_OTHER)
Admission: EM | Admit: 2014-03-28 | Discharge: 2014-03-28 | Disposition: A | Discharge status: Home / Self Care | Payer: 59 | Attending: Emergency Medicine | Admitting: Emergency Medicine

## 2014-03-28 DIAGNOSIS — Z794 Long term (current) use of insulin: Secondary | ICD-10-CM | POA: Diagnosis not present

## 2014-03-28 DIAGNOSIS — E119 Type 2 diabetes mellitus without complications: Secondary | ICD-10-CM | POA: Insufficient documentation

## 2014-03-28 DIAGNOSIS — R0602 Shortness of breath: Secondary | ICD-10-CM | POA: Insufficient documentation

## 2014-03-28 DIAGNOSIS — Z7982 Long term (current) use of aspirin: Secondary | ICD-10-CM | POA: Insufficient documentation

## 2014-03-28 DIAGNOSIS — Z79899 Other long term (current) drug therapy: Secondary | ICD-10-CM | POA: Diagnosis not present

## 2014-03-28 DIAGNOSIS — E669 Obesity, unspecified: Secondary | ICD-10-CM | POA: Diagnosis not present

## 2014-03-28 DIAGNOSIS — Z791 Long term (current) use of non-steroidal anti-inflammatories (NSAID): Secondary | ICD-10-CM | POA: Diagnosis not present

## 2014-03-28 DIAGNOSIS — Z8739 Personal history of other diseases of the musculoskeletal system and connective tissue: Secondary | ICD-10-CM | POA: Diagnosis not present

## 2014-03-28 DIAGNOSIS — K1379 Other lesions of oral mucosa: Secondary | ICD-10-CM | POA: Diagnosis not present

## 2014-03-28 MED ORDER — SODIUM CHLORIDE 0.9 % IV SOLN
INTRAVENOUS | Status: DC
Start: 2014-03-28 — End: 2014-03-28

## 2014-03-28 MED ORDER — DIPHENHYDRAMINE HCL 50 MG/ML IJ SOLN
25.0000 mg | Freq: Once | INTRAMUSCULAR | Status: AC
  Administered 2014-03-28: 25 mg via INTRAVENOUS
  Filled 2014-03-28: qty 1

## 2014-03-28 MED ORDER — PREDNISONE 10 MG PO TABS: 40.0000 mg | ORAL_TABLET | Freq: Every day | ORAL | Status: DC

## 2014-03-28 MED ORDER — FAMOTIDINE 20 MG PO TABS: 20.0000 mg | ORAL_TABLET | Freq: Two times a day (BID) | ORAL | Status: DC

## 2014-03-28 MED ORDER — FAMOTIDINE IN NACL 20-0.9 MG/50ML-% IV SOLN
20.0000 mg | Freq: Once | INTRAVENOUS | Status: AC
  Administered 2014-03-28: 20 mg via INTRAVENOUS
  Filled 2014-03-28: qty 50

## 2014-03-28 MED ORDER — DIPHENHYDRAMINE HCL 25 MG PO TABS: 25.0000 mg | ORAL_TABLET | Freq: Four times a day (QID) | ORAL | Status: DC

## 2014-03-28 MED ORDER — SODIUM CHLORIDE 0.9 % IV BOLUS (SEPSIS)
250.0000 mL | Freq: Once | INTRAVENOUS | Status: AC
  Administered 2014-03-28: 250 mL via INTRAVENOUS

## 2014-03-28 MED ORDER — METHYLPREDNISOLONE SODIUM SUCC 125 MG IJ SOLR
125.0000 mg | Freq: Once | INTRAMUSCULAR | Status: AC
  Administered 2014-03-28: 125 mg via INTRAVENOUS
  Filled 2014-03-28: qty 2

## 2014-03-28 NOTE — ED Provider Notes (Signed)
CSN: [id number]     Arrival date & time 03/28/14  [id number] History   First MD Initiated Contact with Patient 03/28/14 0740     Chief Complaint  Patient presents with  . Oral Swelling     (Consider location/radiation/quality/duration/timing/severity/associated sxs/prior Treatment) The history is provided by the patient.   48 year old male with a feeling of the swelling in his throat. Patient works around mold and mildew and pain yesterday and wonders if that could be a relationship. All symptoms started this morning. Patient felt like he couldn't breathe and that his throat was tightening up. Denies any rash. Denies any swelling to upper tongue. Beverage had this happen before. It is noted that patient is on lisinopril.  Past Medical History  Diagnosis Date  . Gout   . Diabetes mellitus   . Obesity    Past Surgical History  Procedure Laterality Date  . Umbilical hernia repair     Family History  Problem Relation Age of Onset  . Kidney disease Other   . Diabetes Maternal Grandfather    History  Substance Use Topics  . Smoking status: Never Smoker   . Smokeless tobacco: Current User    Types: Chew  . Alcohol Use: Yes    Review of Systems  Constitutional: Negative for fever.  HENT: Positive for trouble swallowing. Negative for congestion.   Eyes: Negative for discharge, redness and itching.  Respiratory: Positive for shortness of breath.   Gastrointestinal: Negative for abdominal pain.  Genitourinary: Negative for dysuria.  Musculoskeletal: Negative for myalgias.  Skin: Negative for rash.  Neurological: Negative for headaches.  Hematological: Does not bruise/bleed easily.  Psychiatric/Behavioral: Negative for confusion.      Allergies  Review of patient's allergies indicates no known allergies.  Home Medications   Prior to Admission medications   Medication Sig Start Date End Date Taking? Authorizing Provider  aspirin 81 MG tablet Take 81 mg by mouth daily.       Historical Provider, MD  atorvastatin (LIPITOR) 80 MG tablet Take 80 mg by mouth daily.    Historical Provider, MD  Celecoxib (CELEBREX PO) Take 1 tablet by mouth daily.    Historical Provider, MD  fenofibrate 160 MG tablet Take 160 mg by mouth daily.    Historical Provider, MD  gabapentin (NEURONTIN) 100 MG capsule Take 1-2 tablets 3 times a day as needed 10/05/13   Nelia Shiobert L Beaton, MD  gabapentin (NEURONTIN) 300 MG capsule Take 1 capsule (300 mg total) by mouth 3 (three) times daily. 10/05/13   Nelia Shiobert L Beaton, MD  HYDROcodone-acetaminophen (NORCO/VICODIN) 5-325 MG per tablet Take 2 tablets by mouth every 6 (six) hours as needed for moderate pain.    Historical Provider, MD  ibuprofen (ADVIL,MOTRIN) 800 MG tablet Take 1 tablet (800 mg total) by mouth every 8 (eight) hours as needed. 10/05/13   Nelia Shiobert L Beaton, MD  insulin glargine (LANTUS) 100 UNIT/ML injection Inject 65 Units into the skin at bedtime. 08/17/12   Carlus Pavlovristina Gherghe, MD  Liraglutide (VICTOZA) 18 MG/3ML SOLN injection Inject 0.2 mLs (1.2 mg total) into the skin daily. 07/25/12   Carlus Pavlovristina Gherghe, MD  lisinopril (PRINIVIL,ZESTRIL) 10 MG tablet Take 10 mg by mouth daily.    Historical Provider, MD  METFORMIN HCL PO Take 1,000 mg by mouth 2 (two) times daily.      Historical Provider, MD  Multiple Vitamin (MULTI-VITAMIN PO) Take 1 tablet by mouth daily.      Historical Provider, MD  naproxen sodium (ANAPROX) 220 MG  tablet Take 220 mg by mouth 3 (three) times daily with meals.    Historical Provider, MD  omega-3 acid ethyl esters (LOVAZA) 1 G capsule TAKE 1 CAPSULE BY MOUTH 2 TIMES DAILY WITH MEALS 02/06/14   Carlus Pavlovristina Gherghe, MD  [facility] VERIO test strip USE AS INSTRUCTED 2-3 X A DAY. 08/07/13   Carlus Pavlovristina Gherghe, MD  prednisoLONE 5 MG TABS tablet Take by mouth. Dose Pack    Historical Provider, MD  Rosuvastatin Calcium (CRESTOR PO) Take 1 tablet by mouth daily.    Historical Provider, MD   BP 158/81  Pulse 77  Temp(Src) 98.6 F (37 C) (Oral)   Resp 16  Ht 5\' 11"  (1.803 m)  Wt 272 lb (123.378 kg)  BMI 37.95 kg/m2  SpO2 100% Physical Exam  Nursing note and vitals reviewed. Constitutional: He is oriented to person, place, and time. He appears well-developed and well-nourished. No distress.  HENT:  Head: Normocephalic and atraumatic.  Mouth/Throat: Oropharynx is clear and moist.  Swelling of the uvula is midline. No tongue swelling no lip swelling. No swelling of the neck.  Eyes: Conjunctivae and EOM are normal. Pupils are equal, round, and reactive to light.  Neck: Normal range of motion. Neck supple. No tracheal deviation present. No thyromegaly present.  Cardiovascular: Normal rate, regular rhythm and normal heart sounds.   No murmur heard. Pulmonary/Chest: Effort normal and breath sounds normal. No respiratory distress. He has no wheezes.  Abdominal: Soft. Bowel sounds are normal. There is no tenderness.  Musculoskeletal: Normal range of motion. He exhibits no edema.  Lymphadenopathy:    He has no cervical adenopathy.  Neurological: He is alert and oriented to person, place, and time. No cranial nerve deficit. He exhibits normal muscle tone. Coordination normal.  Skin: Skin is warm. No rash noted. No erythema.    ED Course  Procedures (including critical care time) Labs Review Labs Reviewed - No data to display  Imaging Review No results found.   EKG Interpretation None      MDM   Final diagnoses:  Uvular swelling    Patient with a fullness feeling in his throat seems to be consistent with the finding of the uvula being swollen. Patient states some difficulty breathing however no wheezing. Patient treated in emergency partner and with Solu-Medrol, Benadryl, Pepcid. These were provided IV. No lip swelling no tongue swelling. No hives. Since the cause of the outbreak not clear probably could have been due to inhaling fumes or mold exposure. In addition it is possible lisinopril could be the cause. Followup with  his doctor if this gets to be a recurrent problem.    Vanetta MuldersScott Sharmain Lastra, MD 03/28/14 [phone]

## 2014-03-28 NOTE — Discharge Instructions (Signed)
Take medications as directed. Take prednisone for 5 days. Take the Pepcid for the next 7 days. Take Benadryl at least for the next 2 days. Return for any newer worse symptoms. Work note provided through the weekend. Also as discussed. This gets to be a recurrent problem you're lisinopril could  be the cause.

## 2014-03-28 NOTE — ED Notes (Signed)
Pt states "I need to see the doctor soon, I have to get on to work..." pt encouraged to call his employer and communicate that he is in the ER, reassured that we will write him a work note if needed. Wife speaking with pt supervisor.

## 2014-03-28 NOTE — ED Notes (Signed)
Pt amb to room 6 with quick steady gait in nad. Pt reports sudden onset of "feels like something is swoll up in my throat". Pt states he works around mold and mildew and painted yesterday and wonders if this is related.

## 2014-10-31 ENCOUNTER — Other Ambulatory Visit: Payer: Self-pay

## 2014-10-31 NOTE — Patient Outreach (Signed)
[facility] Care Management  10/31/2014  Doreen SalvageJonathan G Chiaramonte [birth year] [phone]  Phone call to schedule appointment.  No answer and message left.  Plan to send appointment request letter.  Dudley MajorMelissa Takiesha Mcdevitt RN, [facility]BS[facility]M Care Management Coordinator-[facility] Care Management [phone]

## 2014-12-19 ENCOUNTER — Encounter: Payer: Self-pay | Admitting: Cardiovascular Disease

## 2014-12-19 ENCOUNTER — Ambulatory Visit (INDEPENDENT_AMBULATORY_CARE_PROVIDER_SITE_OTHER): Payer: 59 | Admitting: Cardiovascular Disease

## 2014-12-19 VITALS — BP 122/60 | HR 63 | Ht 71.0 in | Wt 273.0 lb

## 2014-12-19 DIAGNOSIS — R079 Chest pain, unspecified: Secondary | ICD-10-CM | POA: Diagnosis not present

## 2014-12-19 DIAGNOSIS — I10 Essential (primary) hypertension: Secondary | ICD-10-CM | POA: Diagnosis not present

## 2014-12-19 NOTE — Assessment & Plan Note (Signed)
History of hypertension blood pressure measured today 122/60. He is on lisinopril. Continue current meds at current dosing

## 2014-12-19 NOTE — Patient Instructions (Signed)
  We will see you back in follow up in 3 months with Dr Allyson SabalBerry.   Dr Allyson SabalBerry has ordered: Exercise Myoview- this is a test that looks at the blood flow to your heart muscle.  It takes approximately 2 1/2 hours. Please follow instruction sheet, as given.

## 2014-12-19 NOTE — Assessment & Plan Note (Signed)
History of hyperlipidemia on atorvastatin 80 mg today followed by his PCP

## 2014-12-19 NOTE — Progress Notes (Signed)
12/19/2014 [name]   Dec 26, 1965  [id number]  Primary Physician Martin LekBURNETT,Martin [name] Primary Cardiologist: Martin GessJonathan J. Shawnetta Lein [name] Roseanne RenoFACP,Martin [name],Martin [name], Martin [name]   HPI:  Martin [name] is a 49 year old married Caucasian male father of 2 children who works at [name] and was referred by Dr. Doristine CounterBurnett for cardiovascular evaluation because of fairly new onset chest pain. This clinical setting profiles notable for treated hypertension, diabetes and hyperlipidemia. He has never had a heart attack or stroke. The pain began a month ago and occur sporadically. Substernal without radiation. It usually occurs with physical activity. He says he is under a lot of stress.   Current Outpatient Prescriptions  Medication Sig Dispense Refill  . atorvastatin (LIPITOR) 80 MG tablet Take 80 mg by mouth daily.    . fenofibrate 160 MG tablet Take 160 mg by mouth daily.    Marland Kitchen. gabapentin (NEURONTIN) 300 MG capsule Take 1 capsule (300 mg total) by mouth 3 (three) times daily. 90 capsule 1  . insulin glargine (LANTUS) 100 UNIT/ML injection Inject 80 Units into the skin at bedtime.     Marland Kitchen. lisinopril (PRINIVIL,ZESTRIL) 10 MG tablet Take 10 mg by mouth daily.    Marland Kitchen. METFORMIN HCL PO Take 1,000 mg by mouth 2 (two) times daily.      . naproxen sodium (ANAPROX) 220 MG tablet Take 220 mg by mouth 3 (three) times daily with meals.    Marland Kitchen. omega-3 acid ethyl esters (LOVAZA) 1 G capsule TAKE 1 CAPSULE BY MOUTH 2 TIMES DAILY WITH MEALS 60 capsule 11  . ONETOUCH VERIO test strip USE AS INSTRUCTED 2-3 X A DAY. 100 each 12  . aspirin 81 MG tablet Take 81 mg by mouth daily.       No current facility-administered medications for this visit.    No Known Allergies  History   Social History  . Marital Status: Married    Spouse Name: N/A  . Number of Children: N/A  . Years of Education: N/A   Occupational History  . Not on file.   Social History Main Topics  . Smoking status: Never Smoker   . Smokeless tobacco: Current User    Types: Chew  . Alcohol Use: Yes  . Drug Use: No  . Sexual Activity: Not on file   Other Topics Concern  . Not on file   Social History Narrative   MARRIED   WORKS FOR [name] FOR 14 YEARS , NIGHT SHIFT     Review of Systems: General: negative for chills, fever, night sweats or weight changes.  Cardiovascular: negative for chest pain, dyspnea on exertion, edema, orthopnea, palpitations, paroxysmal nocturnal dyspnea or shortness of breath Dermatological: negative for rash Respiratory: negative for cough or wheezing Urologic: negative for hematuria Abdominal: negative for nausea, vomiting, diarrhea, bright red blood per rectum, melena, or hematemesis Neurologic: negative for visual changes, syncope, or dizziness All other systems reviewed and are otherwise negative except as noted above.    Blood pressure 122/60, pulse 63, height 5\' 11"  (1.803 m), weight 273 lb (123.832 kg).  General appearance: alert and no distress Neck: no adenopathy, no carotid bruit, no JVD, supple, symmetrical, trachea midline and thyroid not enlarged, symmetric, no tenderness/mass/nodules Lungs: clear to auscultation bilaterally Heart: regular rate and rhythm, S1, S2 normal, no murmur, click, rub or gallop Extremities: extremities normal, atraumatic, no cyanosis or edema  EKG normal sinus rhythm at 63 with septal Q waves. Procedure reviewed this EKG  ASSESSMENT AND PLAN:  Other and unspecified hyperlipidemia History of hyperlipidemia on atorvastatin 80 mg today followed by his PCP  Essential hypertension, benign History of hypertension blood pressure measured today 122/60. He is on lisinopril. Continue current meds at current dosing  Chest pain Martin [name] was referred to me today by Dr. Doristine [name] for new onset chest pain. His risk factors include treated hypertension, diabetes and hyperlipidemia.. The pain began approximately ago and occurs sporadically mostly with exertion but also a  stress-related. An EKG performed which showed septal Q waves although we'll have a comparison. The pain does not radiate nor is it associated with other symptoms. I'm going to get an exercise Myoview stress test to risk stratify him and rule out ischemic etiology given his risk factors.      Martin Gess [name] Martin [name],Martin [name],Martin [name], [name] 12/19/2014 1:58 PM

## 2014-12-19 NOTE — Assessment & Plan Note (Signed)
Mr. Marlene BastMason was referred to me today by Dr. Doristine CounterBurnett for new onset chest pain. His risk factors include treated hypertension, diabetes and hyperlipidemia.. The pain began approximately ago and occurs sporadically mostly with exertion but also a stress-related. An EKG performed which showed septal Q waves although we'll have a comparison. The pain does not radiate nor is it associated with other symptoms. I'm going to get an exercise Myoview stress test to risk stratify him and rule out ischemic etiology given his risk factors.

## 2014-12-24 ENCOUNTER — Other Ambulatory Visit: Payer: Self-pay

## 2014-12-24 NOTE — Patient Outreach (Signed)
[facility]) Care Management  12/24/2014  Martin SalvageJonathan G [name] [birth year] [id number]  Phone call to schedule appointment.  States that he is working out of town Sunday through Friday and he will need to have a day off to schedule an appointment. Pt states that he is at work and unable to talk at this time.  Instructed to call RNCM back when he is available.  Dudley MajorMelissa Sandlin RN, [facility] LLCBS[facility],CCM Care Management Coordinator-Link to Wellness [facility] Care Management [phone]

## 2014-12-31 ENCOUNTER — Telehealth (HOSPITAL_COMMUNITY): Payer: Self-pay

## 2014-12-31 NOTE — Telephone Encounter (Signed)
Encounter complete. 

## 2015-01-01 ENCOUNTER — Telehealth (HOSPITAL_COMMUNITY): Payer: Self-pay

## 2015-01-01 NOTE — Telephone Encounter (Signed)
Encounter complete. 

## 2015-01-02 ENCOUNTER — Encounter: Payer: Self-pay | Admitting: Cardiovascular Disease

## 2015-01-02 ENCOUNTER — Ambulatory Visit (HOSPITAL_COMMUNITY)
Admission: RE | Admit: 2015-01-02 | Discharge: 2015-01-02 | Disposition: A | Discharge status: Home / Self Care | Payer: 59 | Source: Ambulatory Visit | Attending: Cardiovascular Disease | Admitting: Cardiovascular Disease

## 2015-01-02 DIAGNOSIS — I10 Essential (primary) hypertension: Secondary | ICD-10-CM | POA: Diagnosis not present

## 2015-01-02 DIAGNOSIS — E119 Type 2 diabetes mellitus without complications: Secondary | ICD-10-CM | POA: Diagnosis not present

## 2015-01-02 DIAGNOSIS — E669 Obesity, unspecified: Secondary | ICD-10-CM | POA: Insufficient documentation

## 2015-01-02 DIAGNOSIS — R079 Chest pain, unspecified: Secondary | ICD-10-CM | POA: Insufficient documentation

## 2015-01-02 DIAGNOSIS — Z6838 Body mass index (BMI) 38.0-38.9, adult: Secondary | ICD-10-CM | POA: Insufficient documentation

## 2015-01-02 LAB — MYOCARDIAL PERFUSION IMAGING
CHL CUP MPHR: 171 {beats}/min
CHL CUP NUCLEAR SRS: 1
CHL CUP NUCLEAR SSS: 1
Estimated workload: 10.1 METS
Exercise duration (min): 9 min
Exercise duration (sec): 30 s
LV sys vol: 59 mL
LVDIAVOL: 137 mL
Peak HR: 153 {beats}/min
Percent HR: 89 %
RPE: 15
Rest HR: 61 {beats}/min
TID: 1.18

## 2015-01-02 MED ORDER — TECHNETIUM TC 99M SESTAMIBI GENERIC - CARDIOLITE
31.2000 | Freq: Once | INTRAVENOUS | Status: AC | PRN
  Administered 2015-01-02: 31.2 via INTRAVENOUS

## 2015-01-02 MED ORDER — TECHNETIUM TC 99M SESTAMIBI GENERIC - CARDIOLITE
10.1000 | Freq: Once | INTRAVENOUS | Status: AC | PRN
  Administered 2015-01-02: 10.1 via INTRAVENOUS

## 2015-01-06 ENCOUNTER — Encounter: Payer: Self-pay | Admitting: *Deleted

## 2015-01-06 ENCOUNTER — Telehealth: Payer: Self-pay | Admitting: Cardiovascular Disease

## 2015-01-06 NOTE — Telephone Encounter (Signed)
She has not gotten to work yet

## 2015-01-06 NOTE — Telephone Encounter (Signed)
Pt wants to know if stress test results are back from Friday?

## 2015-01-07 NOTE — Telephone Encounter (Signed)
Returned call to patient's wife.01/02/15 myoview normal.Advised Dr.Berry's nurse mailed results.

## 2015-01-07 NOTE — Telephone Encounter (Signed)
Pt 's wife called in wanting to get the results to his Myocardial Perfusion that was done on 7/29. Please f/u with her   Thanks

## 2015-01-09 ENCOUNTER — Other Ambulatory Visit: Payer: Self-pay

## 2015-01-09 VITALS — BP 136/78 | HR 78 | Resp 16 | Ht 71.0 in | Wt 275.6 lb

## 2015-01-09 DIAGNOSIS — E119 Type 2 diabetes mellitus without complications: Secondary | ICD-10-CM

## 2015-01-09 LAB — POCT GLYCOSYLATED HEMOGLOBIN (HGB A1C): Hemoglobin A1C: 10.1

## 2015-01-09 NOTE — Patient Outreach (Signed)
[facility]) Care Management   01/09/2015  Martin [name] May 06, 1966 [id number]  Martin [name] is an 49 y.o. 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  States that he is eating what ever fast food is near where he is staying.  States that he has had more stress since his Father died last month.  States he had been having chest pains and he saw Dr.Berry about it.  States that he had a stress test a few weeks ago and it was normal.  States that he had a period the week after his Father died that he did not take his medications but states he is now taking everyday as ordered.  States he is to see Dr.Kerr for his diabetes in November.    Objective:   Review of Systems  All other systems reviewed and are negative. Member did not bring glucometer for review POCT Hemoglobin A1C-10.1    Physical Exam  Today's Vitals   01/09/15 0906  BP: 136/78  Pulse: 78  Resp: 16  Height: 1.803 m ( )  Weight: 275 lb 9.6 oz (125.011 kg)  SpO2: 96%  PainSc: 0-No pain   Current Medications:   Current Outpatient Prescriptions  Medication Sig Dispense Refill  . aspirin 81 MG tablet Take 81 mg by mouth daily.      Marland Kitchen atorvastatin (LIPITOR) 80 MG tablet Take 80 mg by mouth daily.    . fenofibrate 160 MG tablet Take 160 mg by mouth daily.    Marland Kitchen gabapentin (NEURONTIN) 300 MG capsule Take 1 capsule (300 mg total) by mouth 3 (three) times daily. (Patient taking differently: Take 300 mg by mouth 3 (three) times daily as needed. ) 90 capsule 1  . insulin glargine (LANTUS) 100 UNIT/ML injection Inject 80 Units into the skin at bedtime.     Marland Kitchen lisinopril (PRINIVIL,ZESTRIL) 10 MG tablet Take 10 mg by mouth daily.    Marland Kitchen METFORMIN HCL PO Take 1,000 mg by mouth 2 (two) times daily.      . naproxen sodium  (ANAPROX) 220 MG tablet Take 220 mg by mouth 3 (three) times daily with meals.    Marland Kitchen omega-3 acid ethyl esters (LOVAZA) 1 G capsule TAKE 1 CAPSULE BY MOUTH 2 TIMES DAILY WITH MEALS 60 capsule 11  . ONETOUCH VERIO test strip USE AS INSTRUCTED 2-3 X A DAY. 100 each 12   No current facility-administered medications for this visit.    Functional Status:   In your present state of health, do you have any difficulty performing the following activities: 01/09/2015  Hearing? N  Vision? N  Difficulty concentrating or making decisions? N  Walking or climbing stairs? N  Dressing or bathing? N  Doing errands, shopping? N    Fall/Depression Screening:    PHQ 2/9 Scores 01/09/2015  PHQ - 2 Score 0   [facility] CM Care Plan Problem One        Patient Outreach from 01/09/2015 in [facility] [employer]   Care Plan Problem One  Elevated blood sugars related to dx of DM   Care Plan for Problem One  Active   [facility] Long Term Goal (31-90 days)  Member will decrease hemoglobin A1C by one point in the next 90 days    [facility] Long Term Goal Start Date  01/09/15   Interventions for Problem One Long Term Goal  Reinforced  CHO and portion sizes, Discussed how to make wiser choices at fast food places, Encouraged to eat lunch or to have snacks during the day, instructed to bring snacks with him on his work trips, given handout on foot care and reviewed foot care, instructed to keep appointment with Dr.Kerr on 04/24/15, Reviewed targett goals for CBGs, Instructed to take medications regularly and to take his Metformin with meals     Assessment:   Member seen for follow up office visit for Link to Wellness program for self management of Type 2 diabetes.  Member has had poor control of his diabetes and is not meeting goal of hemoglobin A1C with POCT hemoglobin A1C of 10.1 today. He is eating irregularly at fast food as he travels through the week.  He reports stress with recent death of his Father and family issues.  Member is to see  endocrinologist 04/24/15.  Plan:  1. Plan to check blood sugar twice a day fasting and 2 hours after eating.  Goals of 80-130 fasting and less than 180 after eating.  Keep log of readings and bring to next visit.  Bring meter to visit 2. Plan to eat more salads when eating at fast food places.  Plan to carry low carbohydrate snacks to during the day 3. Plan to keep appointment with Dr. Sharl Ma on 04/24/15 4. Plan to see Link to Wellness on March 20, 2015   [name] RN, [facility] Care Management Coordinator-Link to [facility] Care Management [phone]

## 2015-01-09 NOTE — Patient Instructions (Signed)
1. Plan to check blood sugar twice a day fasting and 2 hours after eating.  Goals of 80-130 fasting and less than 180 after eating.  Keep log of readings and bring to next visit.  Bring meter to visit 2. Plan to eat more salads when eating at fast food places.  Plan to carry low carbohydrate snacks to during the day 3. Plan to keep appointment with Dr. Sharl Ma on 04/24/15 4. Plan to see Link to Wellness on March 20, 2015 at 8:30 AM

## 2015-03-13 ENCOUNTER — Ambulatory Visit (INDEPENDENT_AMBULATORY_CARE_PROVIDER_SITE_OTHER): Payer: 59 | Admitting: Cardiovascular Disease

## 2015-03-13 ENCOUNTER — Encounter: Payer: Self-pay | Admitting: Cardiovascular Disease

## 2015-03-13 VITALS — BP 138/80 | HR 68 | Ht 71.0 in | Wt 276.4 lb

## 2015-03-13 DIAGNOSIS — E781 Pure hyperglyceridemia: Secondary | ICD-10-CM | POA: Diagnosis not present

## 2015-03-13 DIAGNOSIS — R079 Chest pain, unspecified: Secondary | ICD-10-CM | POA: Diagnosis not present

## 2015-03-13 DIAGNOSIS — I10 Essential (primary) hypertension: Secondary | ICD-10-CM | POA: Diagnosis not present

## 2015-03-13 NOTE — Assessment & Plan Note (Signed)
History of hypertension blood pressure measured at 138/80. He is on lisinopril. Continue current meds at current dose

## 2015-03-13 NOTE — Progress Notes (Signed)
03/13/2015 Martin [name]   02-05-1966  [id number]  Primary Physician Martin Lek, MD Primary Cardiologist: Martin Gess MD Martin [name]   HPI:  Mr. Martin [name] is a 49 year old married Caucasian male father of 2 children who works at [employer] and was referred by Dr. Doristine [name] for cardiovascular evaluation because of fairly new onset chest pain. I last saw him in the office 12/19/14. His cardiovascular risk profile is notable for treated hypertension, diabetes and hyperlipidemia. He has never had a heart attack or stroke. The pain began a month prior to his initial office visit and occured sporadically. It was Substernal without radiation. It usually occured with physical activity. He says he was under a lot of stress. He had a Myoview stress test performed 12/23/14 which was entirely normal. His pain has since resolved   Current Outpatient Prescriptions  Medication Sig Dispense Refill  . aspirin 81 MG tablet Take 81 mg by mouth daily.      Marland Kitchen atorvastatin (LIPITOR) 80 MG tablet Take 80 mg by mouth daily.    . fenofibrate 160 MG tablet Take 160 mg by mouth daily.    Marland Kitchen gabapentin (NEURONTIN) 300 MG capsule Take 1 capsule (300 mg total) by mouth 3 (three) times daily. (Patient taking differently: Take 300 mg by mouth 3 (three) times daily as needed. ) 90 capsule 1  . insulin glargine (LANTUS) 100 UNIT/ML injection Inject 80 Units into the skin at bedtime.     Marland Kitchen lisinopril (PRINIVIL,ZESTRIL) 10 MG tablet Take 10 mg by mouth daily.    Marland Kitchen METFORMIN HCL PO Take 1,000 mg by mouth 2 (two) times daily.      . naproxen sodium (ANAPROX) 220 MG tablet Take 220 mg by mouth 3 (three) times daily with meals.    Marland Kitchen omega-3 acid ethyl esters (LOVAZA) 1 G capsule TAKE 1 CAPSULE BY MOUTH 2 TIMES DAILY WITH MEALS 60 capsule 11  . ONETOUCH VERIO test strip USE AS INSTRUCTED 2-3 X A DAY. 100 each 12   No current facility-administered medications for this visit.    No Known Allergies  Social  History   Social History  . Marital Status: Married    Spouse Name: N/A  . Number of Children: N/A  . Years of Education: N/A   Occupational History  . Not on file.   Social History Main Topics  . Smoking status: Never Smoker   . Smokeless tobacco: Current User    Types: Chew  . Alcohol Use: 0.0 oz/week    0 Standard drinks or equivalent per week  . Drug Use: No  . Sexual Activity: Not on file   Other Topics Concern  . 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  Cardiovascular: negative for chest pain, dyspnea on exertion, edema, orthopnea, palpitations, paroxysmal nocturnal dyspnea or shortness of breath Dermatological: negative for rash Respiratory: negative for cough or wheezing Urologic: negative for hematuria Abdominal: negative for nausea, vomiting, diarrhea, bright red blood per rectum, melena, or hematemesis Neurologic: negative for visual changes, syncope, or dizziness All other systems reviewed and are otherwise negative except as noted above.    Blood pressure 138/80, pulse 68, height  (1.803 m), weight 276 lb 6.4 oz (125.374 kg), SpO2 93 %.  General appearance: alert and no distress Neck:  no adenopathy, no carotid bruit, no JVD, supple, symmetrical, trachea midline and thyroid not enlarged, symmetric, no tenderness/mass/nodules Lungs: clear to auscultation bilaterally Heart: regular rate and rhythm, S1, S2 normal, no murmur, click, rub or gallop Extremities: extremities normal, atraumatic, no cyanosis or edema  EKG not performed today  ASSESSMENT AND PLAN:   Hypertriglyceridemia History of hyperlipidemia/hypertriglyceridemia on fenofibrate, Lovaza and atorvastatin followed by his PCP  Essential hypertension, benign History of hypertension blood pressure measured at 138/80. He is on  lisinopril. Continue current meds at current dose  Chest pain History of chest pain with recent Myoview stress test performed 01/02/15 which was entirely normal.  Since last office visit his chest pain has resolved.      Martin Gess MD FACP,FACC,FAHA, [facility] 03/13/2015 7:55 AM

## 2015-03-13 NOTE — Patient Instructions (Signed)
Medication Instructions:  Your physician recommends that you continue on your current medications as directed. Please refer to the Current Medication list given to you today.   Labwork: none  Testing/Procedures: none  Follow-Up: Follow up with Dr. Berry as needed.   Any Other Special Instructions Will Be Listed Below (If Applicable).   

## 2015-03-13 NOTE — Assessment & Plan Note (Signed)
History of chest pain with recent Myoview stress test performed 01/02/15 which was entirely normal.  Since last office visit his chest pain has resolved.

## 2015-03-13 NOTE — Assessment & Plan Note (Signed)
History of hyperlipidemia/hypertriglyceridemia on fenofibrate, Lovaza and atorvastatin followed by his PCP

## 2015-03-20 ENCOUNTER — Other Ambulatory Visit: Payer: 59

## 2015-03-20 NOTE — Patient Outreach (Signed)
[facility]) Care Management  03/20/2015  Martin SalvageJonathan G [name] June 30, 1965 [id number]  Member did not show for office visit today.  Missed appointment letter sent. Dudley MajorMelissa Tiane Szydlowski RN, [facility],CCM Care Management Coordinator-Link to Wellness [facility] Care Management [phone]

## 2015-04-10 ENCOUNTER — Other Ambulatory Visit: Payer: Self-pay

## 2015-04-10 ENCOUNTER — Ambulatory Visit: Payer: 59

## 2015-04-10 NOTE — Patient Outreach (Signed)
[facility]) Care Management  04/10/2015  Martin SalvageJonathan G [name] 05-28-1966 [id number]  Member came to office to reschedule today's visit as he has been called out of town for work.  States he is to see Dr.Kerr on 11/18 and he hopes he will help get his medications and insulin adjusted to help lower his blood sugars. Link to Wellness visit rescheduled for 05/22/15

## 2015-05-22 ENCOUNTER — Other Ambulatory Visit: Payer: Self-pay

## 2015-05-26 ENCOUNTER — Other Ambulatory Visit: Payer: Self-pay

## 2015-05-26 NOTE — Patient Outreach (Signed)
[facility]) Care Management  05/26/2015  Martin SalvageJonathan G [name] 1966-01-11 [id number]  Member did not show for scheduled appointment on 05/22/15.  Missed appointment letter sent. Dudley MajorMelissa Tomasina Keasling RN, [facility],CCM Care Management Coordinator-Link to Wellness [facility] Care Management [phone]

## 2015-06-17 MED FILL — TRULICITY 1.5 MG/0.5 ML PEN: 1.5 | 28 days supply | Qty: 2 | Fill #0

## 2015-06-19 ENCOUNTER — Other Ambulatory Visit: Payer: Self-pay

## 2015-06-19 VITALS — BP 130/80 | HR 68 | Resp 16 | Ht 71.0 in | Wt 278.0 lb

## 2015-06-19 DIAGNOSIS — E119 Type 2 diabetes mellitus without complications: Secondary | ICD-10-CM

## 2015-06-19 DIAGNOSIS — Z794 Long term (current) use of insulin: Principal | ICD-10-CM

## 2015-06-19 NOTE — Patient Instructions (Signed)
1. Plan to check blood sugar twice a day fasting and 2 hours after eating.  Goals of 80-130 fasting and less than 180 after eating.  Keep log of readings and bring to next visit.  Bring meter to visit 2. Plan to eat more salads when eating at fast food places.  Plan to carry low carbohydrate snacks to during the day and eat something at your one  hour break 3. Plan to walk on treadmill twice a week for 30 minutes 4. Plan to keep appointment with Dr. Sharl MaKerr on 08/07/15 5. Plan to see Link to Wellness on 09/18/15 at 8:30 AM

## 2015-06-19 NOTE — Patient Outreach (Signed)
[facility](THN) Care Management   06/19/2015  Martin SalvageJonathan G Umana 01-Jan-1966 [id number]  Martin SalvageJonathan G Goodwill is an 50 y.o. male.   Member seen for follow up office visit for Link to Wellness program for self management of Type 2 diabetes  Subjective: Member states that he saw Dr.Kerr in November and his hemoglobin A1C was 10.6.  States he is now on Trulicity and he is to take all of his Metformin in the morning.  States he is to go back to see him in March. States that his blood sugars are better and range 150-200 after meals.  States that his appetite is decreased since he started the Trulicity but he has not lost any weight.  States his work schedule continues to be crazy and he is now on the evening/night shift.  States he works 15 hours a day and is very stressed.  States that he only eats about twice a day but he has been trying to eat more salads for his PM meal.  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  States he is also having stress at home.  Objective:   Review of Systems  All other systems reviewed and are negative.   Physical Exam Today's Vitals   06/19/15 0858  BP: 130/80  Pulse: 68  Resp: 16  Height: 1.803 m (5\' 11" )  Weight: 278 lb (126.1 kg)  SpO2: 95%  PainSc: 0-No pain   Current Medications:   Current Outpatient Prescriptions  Medication Sig Dispense Refill  . aspirin 81 MG tablet Take 81 mg by mouth daily.      Marland Kitchen. atorvastatin (LIPITOR) 80 MG tablet Take 80 mg by mouth daily.    . Dulaglutide (TRULICITY) 1.5 MG/0.5ML SOPN Inject 1.5 mg into the skin once a week.    . fenofibrate 160 MG tablet Take 160 mg by mouth daily.    Marland Kitchen. gabapentin (NEURONTIN) 300 MG capsule Take 1 capsule (300 mg total) by mouth 3 (three) times daily. (Patient taking differently: Take 300 mg by mouth 3 (three) times daily as needed. ) 90 capsule 1  . insulin glargine (LANTUS) 100 UNIT/ML injection  Inject 80 Units into the skin at bedtime.     Marland Kitchen. lisinopril (PRINIVIL,ZESTRIL) 10 MG tablet Take 10 mg by mouth daily.    Marland Kitchen. METFORMIN HCL PO Take 2,000 mg by mouth daily.     . naproxen sodium (ANAPROX) 220 MG tablet Take 220 mg by mouth 3 (three) times daily with meals.    Marland Kitchen. omega-3 acid ethyl esters (LOVAZA) 1 G capsule TAKE 1 CAPSULE BY MOUTH 2 TIMES DAILY WITH MEALS 60 capsule 11  . ONETOUCH VERIO test strip USE AS INSTRUCTED 2-3 X A DAY. 100 each 12   No current facility-administered medications for this visit.    Functional Status:   In your present state of health, do you have any difficulty performing the following activities: 06/19/2015 01/09/2015  Hearing? N N  Vision? N N  Difficulty concentrating or making decisions? N N  Walking or climbing stairs? N N  Dressing or bathing? N N  Doing errands, shopping? N N    Fall/Depression Screening:    PHQ 2/9 Scores 06/19/2015 01/09/2015  PHQ - 2 Score 0 0    Assessment:   Member seen for follow up office visit for Link to Wellness program for self management of Type 2  diabetes.  Member has had poor control of his diabetes and is not meeting goal of hemoglobin A1C with POCT hemoglobin A1C of 10.6.  Member saw  Endocrinologist on 04/24/15 and was started on Trulicity.  Member reports lowering blood sugars from the 250-300 range to the 150-200 range.  Member reports trying to make wiser choices when eating out but is not eating anything while he is on the job for 15 hours.  Reports exercising 1-2 times a week.  Member is to have follow up with Dr.Kerr on 08/07/15.     Plan:  Plan to check blood sugar twice a day fasting and 2 hours after eating.  Goals of 80-130 fasting and less than 180 after eating.  Keep log of readings and bring to next visit.  Bring meter to visit Plan to eat more salads when eating at fast food places.  Plan to carry low carbohydrate snacks to during the day and eat something at your one  hour break Plan to walk on  treadmill twice a week for 30 minutes Plan to keep appointment with Dr. Sharl Ma on 08/07/15 Plan to see Link to Wellness on 09/18/15 at 8:30 AM  [facility] CM Care Plan Problem One        Most Recent Value   Care Plan Problem One  Elevated blood sugars related to dx of DM   Role Documenting the Problem One  Care Management Coordinator   Care Plan for Problem One  Active   THN Long Term Goal (31-90 days)  Member will decrease hemoglobin A1C by one point in the next 90 days    THN Long Term Goal Start Date  06/19/15 [Not at goal hemoglobin A1C 10.6 on 04/24/15]   Interventions for Problem One Long Term Goal  Reinforced  CHO and portion sizes, Prasied for making wiser choices at fast food places, Again encouraged to eat lunch or to have snacks on his hour break, Reinforced to bring snacks with him on his work trips and use the refrigerator in his motel room if available,Instructed on the effect of stress on his blood sugars and encouraged to get regular sleep,  Instructed to keep appointment with Dr.Kerr on 08/07/15, Reviewed target goals for CBGs, Reinforced to take medications regularly and to take his Metformin with food, Reinforced importance of weight loss to help with his glycemic control, Reinforced to get regular exercise     [name] RN, [facility] Care Management Coordinator-Link to Wellness [facility] Care Management [phone]

## 2015-06-26 DIAGNOSIS — J069 Acute upper respiratory infection, unspecified: Secondary | ICD-10-CM | POA: Diagnosis not present

## 2015-06-26 DIAGNOSIS — B9789 Other viral agents as the cause of diseases classified elsewhere: Secondary | ICD-10-CM | POA: Diagnosis not present

## 2015-08-04 MED FILL — TRULICITY 1.5 MG/0.5 ML PEN: 1.5 | 28 days supply | Qty: 2 | Fill #1

## 2015-08-06 MED FILL — GABAPENTIN 300 MG CAPSULE: 300 | 30 days supply | Qty: 90 | Fill #0

## 2015-08-24 MED FILL — LANTUS SOLOSTAR 100 UNITS/M: 100 | 90 days supply | Qty: 72 | Fill #0

## 2015-08-27 MED FILL — UNIFINE PENTIPS 8MM 31G: 31G X 8 MM | 90 days supply | Qty: 100 | Fill #0

## 2015-09-04 MED FILL — METHOCARBAMOL 750 MG TABLET: 750 | 20 days supply | Qty: 40 | Fill #0

## 2015-09-15 MED FILL — ATORVASTATIN 80 MG TABLET: 80 | 90 days supply | Qty: 90 | Fill #0

## 2015-09-15 MED FILL — FENOFIBRATE 160 MG TABLET: 160 | 90 days supply | Qty: 90 | Fill #0

## 2015-09-15 MED FILL — LISINOPRIL 10 MG TABLET: 10 | 90 days supply | Qty: 90 | Fill #0

## 2015-09-18 ENCOUNTER — Ambulatory Visit: Payer: Self-pay

## 2015-09-21 ENCOUNTER — Other Ambulatory Visit: Payer: Self-pay

## 2015-09-21 NOTE — Patient Outreach (Signed)
[facility](THN) Care Management  09/21/2015  Martin SalvageJonathan G [name] January 22, 1966 [id number]   Telephone call to member as he missed his scheduled appointment on 09/18/15.  States he has been working out of town for the last 3 weeks.  [place] [facility]  Rescheduled appointment for 10/16/15.  Dudley MajorMelissa Jaymar Loeber RN, [facility],CCM Care Management Coordinator-Link to Wellness [facility] Care Management [phone]

## 2015-10-05 DIAGNOSIS — I152 Hypertension secondary to endocrine disorders: Secondary | ICD-10-CM | POA: Insufficient documentation

## 2015-10-05 DIAGNOSIS — E669 Obesity, unspecified: Secondary | ICD-10-CM | POA: Insufficient documentation

## 2015-10-05 DIAGNOSIS — I10 Essential (primary) hypertension: Secondary | ICD-10-CM | POA: Insufficient documentation

## 2015-10-05 DIAGNOSIS — E1165 Type 2 diabetes mellitus with hyperglycemia: Secondary | ICD-10-CM | POA: Insufficient documentation

## 2015-10-05 DIAGNOSIS — L03311 Cellulitis of abdominal wall: Secondary | ICD-10-CM | POA: Diagnosis not present

## 2015-10-05 DIAGNOSIS — E119 Type 2 diabetes mellitus without complications: Secondary | ICD-10-CM | POA: Insufficient documentation

## 2015-10-15 MED FILL — metFORMIN HCL 1000 MG TABS: 1000 | 90 days supply | Qty: 180 | Fill #0

## 2015-10-16 ENCOUNTER — Other Ambulatory Visit: Payer: Self-pay

## 2015-10-16 VITALS — BP 128/78 | HR 64 | Resp 16 | Ht 71.0 in | Wt 280.4 lb

## 2015-10-16 DIAGNOSIS — Z794 Long term (current) use of insulin: Principal | ICD-10-CM

## 2015-10-16 DIAGNOSIS — E119 Type 2 diabetes mellitus without complications: Secondary | ICD-10-CM

## 2015-10-16 LAB — POCT GLYCOSYLATED HEMOGLOBIN (HGB A1C): HEMOGLOBIN A1C: 9.5

## 2015-10-16 NOTE — Patient Instructions (Signed)
1. Plan to check blood sugar twice a day fasting and 2 hours after eating.  Goals of 80-130 fasting and less than 180 after eating.  Keep log of readings and bring to next visit.  Bring meter to visit 2. Plan to eat more salads when eating at fast food places.  Plan to carry low carbohydrate snacks to during the day and eat something at your one hour break.  Plan to take protein bars with you. 3. Plan to walk on treadmill twice a week for 30 minutes 4. Plan to make an appointment with Dr. Sharl MaKerr  5. Plan to see Link to Wellness on 01/15/16 at 8:45 AM

## 2015-10-16 NOTE — Patient Outreach (Signed)
[facility]) Care Management   10/16/2015  Martin [name] 03-23-1966 [id number]  Martin [name] is an 50 y.o. male.   Member seen for follow up office visit for Link to Wellness program for self management of Type 2 diabetes  Subjective: Member states that he continues to be under a lot of stress with his work being out of town all week.  States he had to cancel his follow up appt with Dr.Kerr and he is trying to get it rescheduled.  States that he developed an infection on his rt lower abdomen.  States that he has completed his antibiotics and it is much better now.  States that his blood sugars went up when he got the infection and while he was on the antibiotics.  States he eats at very irregular times and skips meals frequently when working.    Objective:   Review of Systems  Skin:       Cellulitis Rt lower abd  All other systems reviewed and are negative. POC HemoglobinA1C- 9.5%  Physical Exam  Skin:      Today's Vitals   10/16/15 0902  BP: 128/78  Pulse: 64  Resp: 16  Height: 1.803 m ( )  Weight: 280 lb 6.4 oz (127.189 kg)  SpO2: 93%  PainSc: 0-No pain    Encounter Medications:   Outpatient Encounter Prescriptions as of 10/16/2015  Medication Sig  . aspirin 81 MG tablet Take 81 mg by mouth daily.    Marland Kitchen atorvastatin (LIPITOR) 80 MG tablet Take 80 mg by mouth daily.  . Dulaglutide (TRULICITY) 1.5 MG/0.5ML SOPN Inject 1.5 mg into the skin once a week.  . fenofibrate 160 MG tablet Take 160 mg by mouth daily.  Marland Kitchen gabapentin (NEURONTIN) 300 MG capsule Take 1 capsule (300 mg total) by mouth 3 (three) times daily. (Patient taking differently: Take 300 mg by mouth 3 (three) times daily as needed. )  . insulin glargine (LANTUS) 100 UNIT/ML injection Inject 80 Units into the skin at bedtime.   Marland Kitchen lisinopril (PRINIVIL,ZESTRIL) 10 MG tablet Take 10 mg by mouth daily.  Marland Kitchen METFORMIN HCL PO Take 2,000 mg by mouth daily.   . naproxen sodium (ANAPROX) 220 MG  tablet Take 220 mg by mouth 3 (three) times daily with meals.  Marland Kitchen omega-3 acid ethyl esters (LOVAZA) 1 G capsule TAKE 1 CAPSULE BY MOUTH 2 TIMES DAILY WITH MEALS  . ONETOUCH VERIO test strip USE AS INSTRUCTED 2-3 X A DAY.   No facility-administered encounter medications on file as of 10/16/2015.    Functional Status:   In your present state of health, do you have any difficulty performing the following activities: 10/16/2015 06/19/2015  Hearing? N N  Vision? N N  Difficulty concentrating or making decisions? N N  Walking or climbing stairs? N N  Dressing or bathing? N N  Doing errands, shopping? N N    Fall/Depression Screening:    PHQ 2/9 Scores 10/16/2015 06/19/2015 01/09/2015  PHQ - 2 Score 0 0 0    Assessment:  Member seen for follow up office visit for Link to Wellness program for self management of Type 2 diabetes. Member has had poor control of his diabetes and is not meeting goal of hemoglobin A1C of with POC hemoglobin A1C of 9.5% today which is down from 10.6%. Member had to cancel Endocrinologist appt.  Member reports blood sugars 150-200 fasting and 150-300 after meals  Member reports having difficulty making wiser choices when eating out and  is not eating anything while he is on the job for 15 hours. Reports only exercise of walking at work.  Member recovering from cellulitis/abcess of rt lower abd wound and has completed antibiotics.  Member is past due for regular dental checkup.  Member trying to find job with less travel but has not had any success so far.  Plan:  Plan to check blood sugar twice a day fasting and 2 hours after eating.  Goals of 80-130 fasting and less than 180 after eating.  Keep log of readings and bring to next visit.  Bring meter to visit Plan to eat more salads when eating at fast food places.  Plan to carry low carbohydrate snacks to during the day and eat something at your one hour break.  Plan to take protein bars with you. Plan to walk on treadmill  twice a week for 30 minutes Plan to make an appointment with Dr. Sharl MaKerr  Plan to see Link to Wellness on 01/15/16 at 8:45 AM  [facility] CM Care Plan Problem One        Most Recent Value   Care Plan Problem One  Elevated blood sugars related to dx of DM   Role Documenting the Problem One  Care Management Coordinator   Care Plan for Problem One  Active   THN Long Term Goal (31-90 days)  Member will decrease hemoglobin A1C by one point in the next 90 days    THN Long Term Goal Start Date  10/16/15 [Contine not at goal hemoglobin A1C 9.5% today down from 10.6]   Interventions for Problem One Long Term Goal  Reinforced  CHO and portion sizes, Encouraged to  make wiser choices at fast food places, Again encouraged to eat lunch or to have snacks on his hour break, Instructed to try buying protein bars on the weekend before he goes out of town to take with him, Reinforced to bring snacks with him on his work trips and use the refrigerator in his motel room if available, Instructed that elevated blood sugars can slow the healing of his skin infection and discussed the effects that infection can have on his blood sugars, Instructed to return to see MD if his abd wound worsens or does not heal,  Instructed to call to reschedule appointment with Dr.Kerr, Reviewed target goals for CBGs, Reinforced to take medications regularly and to take his Metformin with food, Reinforced importance of weight loss to help with his glycemic control, Reinforced to get regular exercise     Dudley MajorMelissa Henrine Hayter RN, [facility] Hospital[facility],CCM Care Management Coordinator-Link to Wellness [facility] Care Management [phone]

## 2015-11-13 MED FILL — LANTUS SOLOSTAR 100 UNITS/M: 100 | 90 days supply | Qty: 75 | Fill #0

## 2015-11-18 DIAGNOSIS — Z794 Long term (current) use of insulin: Secondary | ICD-10-CM | POA: Diagnosis not present

## 2015-11-18 DIAGNOSIS — Z5181 Encounter for therapeutic drug level monitoring: Secondary | ICD-10-CM | POA: Diagnosis not present

## 2015-11-18 DIAGNOSIS — E1165 Type 2 diabetes mellitus with hyperglycemia: Secondary | ICD-10-CM | POA: Diagnosis not present

## 2015-11-18 DIAGNOSIS — Z6839 Body mass index (BMI) 39.0-39.9, adult: Secondary | ICD-10-CM | POA: Diagnosis not present

## 2015-11-18 DIAGNOSIS — N529 Male erectile dysfunction, unspecified: Secondary | ICD-10-CM | POA: Diagnosis not present

## 2015-11-18 MED FILL — TRESIBA FLEXTOUCH 200 UNITS: 200 | 18 days supply | Qty: 9 | Fill #0

## 2016-01-15 ENCOUNTER — Ambulatory Visit: Payer: Self-pay

## 2016-01-15 ENCOUNTER — Other Ambulatory Visit: Payer: Self-pay

## 2016-01-15 DIAGNOSIS — Z794 Long term (current) use of insulin: Principal | ICD-10-CM

## 2016-01-15 DIAGNOSIS — E119 Type 2 diabetes mellitus without complications: Secondary | ICD-10-CM

## 2016-01-15 NOTE — Patient Outreach (Signed)
[facility](THN) Care Management  01/15/2016   Martin [name] 09-21-1965 [id number]  Subjective: Member called as he did not show up for 8:45AM appt today.   States that he has a new job and he was unable to come today.  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  States he is working 15- 20 hours a day during this time of year.  States he did see Dr.Kerr in June and he changed his insulin from Lantus to [place].  States that his blood sugars are much better and he rarely has a reading over 250 now.  States he is to see him again in September.     Current Medications:  Current Outpatient Prescriptions  Medication Sig Dispense Refill  . aspirin 81 MG tablet Take 81 mg by mouth daily.      Marland Kitchen. atorvastatin (LIPITOR) 80 MG tablet Take 80 mg by mouth daily.    . Dulaglutide (TRULICITY) 1.5 MG/0.5ML SOPN Inject 1.5 mg into the skin once a week.    . fenofibrate 160 MG tablet Take 160 mg by mouth daily.    Marland Kitchen. gabapentin (NEURONTIN) 300 MG capsule Take 1 capsule (300 mg total) by mouth 3 (three) times daily. (Patient taking differently: Take 300 mg by mouth 3 (three) times daily as needed. ) 90 capsule 1  . Insulin Degludec (TRESIBA FLEXTOUCH) 200 UNIT/ML SOPN Inject 80 Units into the skin daily.    Marland Kitchen. lisinopril (PRINIVIL,ZESTRIL) 10 MG tablet Take 10 mg by mouth daily.    Marland Kitchen. METFORMIN HCL PO Take 2,000 mg by mouth daily.     . naproxen sodium (ANAPROX) 220 MG tablet Take 220 mg by mouth 3 (three) times daily with meals.    Marland Kitchen. omega-3 acid ethyl esters (LOVAZA) 1 G capsule TAKE 1 CAPSULE BY MOUTH 2 TIMES DAILY WITH MEALS 60 capsule 11  . ONETOUCH VERIO test strip USE AS INSTRUCTED 2-3 X A DAY. 100 each 12  . insulin glargine (LANTUS) 100 UNIT/ML injection Inject 80 Units into the skin at bedtime.      No current facility-administered medications for this visit.     Functional Status:  In your present state of health, do you have any difficulty performing the following activities:  10/16/2015 06/19/2015  Hearing? N N  Vision? N N  Difficulty concentrating or making decisions? N N  Walking or climbing stairs? N N  Dressing or bathing? N N  Doing errands, shopping? N N  Some recent data might be hidden    Fall/Depression Screening: PHQ 2/9 Scores 10/16/2015 06/19/2015 01/09/2015  PHQ - 2 Score 0 0 0    Assessment: Member seen for follow up telephone call for Link to Wellness program for self management of Type 2 diabetes. Member has had poor control of his diabetes and is not meeting goal of hemoglobin A1C of with last hemoglobin A1C of 9.5%. Member saw endocrinologist and has changed insulin to [place].  Reports improved blood sugars since change and with new job.  Member is to see MD in September,   Member is past due for regular dental checkup.  Member now has a new job in which he is not traveling but works long hours.  Plan:   Plan to see Dr. Sharl MaKerr on 02/24/16 Plan to return to Link to Wellness on 03/21/16  [facility] CM Care Plan Problem One   Flowsheet Row Most Recent Value  Care Plan Problem One  Elevated blood sugars as evidenced by hemoglobin A1C of  10.6% related to dx of Type 2  DM  Role Documenting the Problem One  Care Management Coordinator  Care Plan for Problem One  Active  THN Long Term Goal (31-90 days)  Member will decrease hemoglobin A1C by one point in the next 90 days   THN Long Term Goal Start Date  01/15/16  Interventions for Problem One Long Term Goal  Reinforced to follow a low CHO diet and to watch his portion sizes,  Reinforced  to keep appointment with Dr.Kerr, Reviewed target goals for CBGs, Reinforced to take medications regularly and to take his Metformin with food, Reinforced importance of weight loss to help with his glycemic control, Reinforced to get regular exercise     [name] RN, [facility] Care Management Coordinator-Link to Wellness [facility] Care Management [phone]

## 2016-02-02 MED FILL — ATORVASTATIN 80 MG TABLET: 80 | 30 days supply | Qty: 30 | Fill #0

## 2016-02-02 MED FILL — LISINOPRIL 10 MG TABLET: 10 | 30 days supply | Qty: 30 | Fill #0

## 2016-02-02 MED FILL — FENOFIBRATE 160 MG TABLET: 160 | 30 days supply | Qty: 30 | Fill #0

## 2016-02-15 MED FILL — metFORMIN HCL 1000 MG TABS: 1000 | 90 days supply | Qty: 180 | Fill #0

## 2016-03-21 ENCOUNTER — Other Ambulatory Visit: Payer: Self-pay

## 2016-03-21 VITALS — BP 144/88 | HR 60 | Resp 16 | Ht 71.0 in | Wt 266.2 lb

## 2016-03-21 DIAGNOSIS — Z794 Long term (current) use of insulin: Principal | ICD-10-CM

## 2016-03-21 DIAGNOSIS — E119 Type 2 diabetes mellitus without complications: Secondary | ICD-10-CM

## 2016-03-21 NOTE — Patient Outreach (Signed)
[facility]N) Care Management   03/21/2016  Martin SalvageJonathan G [name] 06/09/1965 [id number]  Martin SalvageJonathan G [name] is an 50 y.o. male.   Member seen for follow up office visit for Link to Wellness program for self management of Type 2 diabetes  Subjective: Member states that he has been eating better and getting a lot of exercise daily with his work.  States that he now is working at [employer].  States his work is very physical and he is walking all day long.  States he has lost weight since starting his job.  States he is trying to eat a healthy snack midmorning and he is eating baked or broiled meats and vegetables for dinner.  States his has not been taking the [place]iba due to cost and he has been taking  Lantus.  States his will run out of Lantus before he sees Dr.Kerr in November.  States he did not get a savings card for the [place]iba. States that his blood sugars have been better and have ranged from 105-120 in the morning and 160-180 after supper.  Objective:   Review of Systems  All other systems reviewed and are negative.   Physical Exam Today's Vitals   03/21/16 1634 03/21/16 1635  BP: (!) 144/88   Pulse: 60   Resp: 16   SpO2: 99%   Weight: 266 lb 3.2 oz (120.7 kg)   Height: 1.803 m (5\' 11" )   PainSc: 0-No pain 0-No pain   Encounter Medications:   Outpatient Encounter Prescriptions as of 03/21/2016  Medication Sig  . aspirin 81 MG tablet Take 81 mg by mouth daily.    Marland Kitchen. atorvastatin (LIPITOR) 80 MG tablet Take 80 mg by mouth daily.  . Dulaglutide (TRULICITY) 1.5 MG/0.5ML SOPN Inject 1.5 mg into the skin once a week.  . fenofibrate 160 MG tablet Take 160 mg by mouth daily.  Marland Kitchen. gabapentin (NEURONTIN) 300 MG capsule Take 1 capsule (300 mg total) by mouth 3 (three) times daily. (Patient taking differently: Take 300 mg by mouth 3 (three) times daily as needed. )  . insulin glargine (LANTUS) 100 UNIT/ML injection Inject 100 Units into the skin at bedtime.   Marland Kitchen. lisinopril  (PRINIVIL,ZESTRIL) 10 MG tablet Take 10 mg by mouth daily.  Marland Kitchen. METFORMIN HCL PO Take 2,000 mg by mouth daily.   . naproxen sodium (ANAPROX) 220 MG tablet Take 220 mg by mouth 3 (three) times daily with meals.  Marland Kitchen. omega-3 acid ethyl esters (LOVAZA) 1 G capsule TAKE 1 CAPSULE BY MOUTH 2 TIMES DAILY WITH MEALS  . ONETOUCH VERIO test strip USE AS INSTRUCTED 2-3 X A DAY.  Marland Kitchen. Insulin Degludec (TRESIBA FLEXTOUCH) 200 UNIT/ML SOPN Inject 80 Units into the skin daily.   No facility-administered encounter medications on file as of 03/21/2016.     Functional Status:   In your present state of health, do you have any difficulty performing the following activities: 03/21/2016 10/16/2015  Hearing? N N  Vision? N N  Difficulty concentrating or making decisions? N N  Walking or climbing stairs? N N  Dressing or bathing? N N  Doing errands, shopping? N N  Some recent data might be hidden    Fall/Depression Screening:    PHQ 2/9 Scores 03/21/2016 10/16/2015 06/19/2015 01/09/2015  PHQ - 2 Score 0 0 0 0    Assessment:  Member seen for follow up telephone call for Link to Wellness program for self management of Type 2 diabetes. Member has had poor control  of his diabetes and is not meeting goal of hemoglobin A1C of with last hemoglobin A1C of 9.5%. Member saw endocrinologist and was to change insulin to [place].  He has been using Lantus that he had on hand and not using the [place] due to cost of $100 a month.  Member had not been given a savings card for [place].   Reports improved blood sugars  with new job.  Member has lost 15 lbs since last visit.  Reports eating better.  Member is to see MD  04/08/16,  Member is past due for regular dental checkup. Member now has a new job in which he is not traveling but works long hours.   Plan:  Plan to check blood sugar twice a day fasting and 2 hours after eating.  Goals of 80-130 fasting and less than 180 after eating.  Keep log of readings and bring to next visit.   Bring meter to visit Plan to continue to eat 3 healthy meals a day.  Plan to carry low carbohydrate snacks to during the day [place] Plan  to go to pharmacy and get refill of Tresiba Plan to enroll in [place] program Plan to keep appointment with Dr. Sharl Ma 04/08/16       Plan to see Link to Wellness on 07/26/16 at [facility]   [facility] CM Care Plan Problem One   Flowsheet Row Most Recent Value  Care Plan Problem One  Elevated blood sugars as evidenced by hemoglobin A1C of 10.6% related to dx of Type 2  DM  Role Documenting the Problem One  Care Management Coordinator  Care Plan for Problem One  Active  THN Long Term Goal (31-90 days)  Member will decrease hemoglobin A1C by one point in the next 90 days   THN Long Term Goal Start Date  03/21/16  Interventions for Problem One Long Term Goal  Reinforced to follow a low CHO diet and to watch his portion sizes,  Reinforced  to keep appointment with Dr.Kerr, Assisted member to complete savings card for [place] and instructed to take to pharmacy to get [place] Rx filled, Given handout on the [place] program and instructed to enroll, Praised for his weight loss and encouraged to continue to lose weight, Reviewed target goals for CBGs,  Reinforced to get regular exercise     [name] RN, [facility] Care Management Coordinator-Link to Wellness [facility] Care Management [phone]

## 2016-03-22 NOTE — Patient Instructions (Signed)
1. Plan to check blood sugar twice a day fasting and 2 hours after eating.  Goals of 80-130 fasting and less than 180 after eating.  Keep log of readings and bring to next visit.  Bring meter to visit 2. Plan to continue to eat 3 healthy meals a day.  Plan to carry low carbohydrate snacks to during the day 3. Plan [place] daily 4. Plan  to go to pharmacy and get refill of Tresiba 5. [facility] 6. Plan to keep appointment with Dr. Sharl MaKerr 04/08/16             Plan to see Link to Wellness on 07/26/16 at [facility]

## 2016-03-23 MED FILL — TRESIBA FLEXTOUCH 200 UNITS: 200 | 18 days supply | Qty: 9 | Fill #1

## 2016-03-25 MED FILL — NOVOFINE 32G NEEDLES: 32G X 6 MM | 90 days supply | Qty: 100 | Fill #0

## 2016-04-06 MED FILL — LISINOPRIL 10 MG TABLET: 10 | 15 days supply | Qty: 15 | Fill #0

## 2016-04-06 MED FILL — TRESIBA FLEXTOUCH 200 UNITS: 200 | 18 days supply | Qty: 9 | Fill #2

## 2016-04-06 MED FILL — ATORVASTATIN 80 MG TABLET: 80 | 15 days supply | Qty: 15 | Fill #0

## 2016-04-11 MED FILL — FENOFIBRATE 160 MG TABLET: 160 | 15 days supply | Qty: 15 | Fill #0

## 2016-05-26 MED FILL — TRESIBA FLEXTOUCH 200 UNITS: 200 | 18 days supply | Qty: 9 | Fill #3

## 2016-05-26 MED FILL — NOVOFINE 32G NEEDLES: 32G X 6 MM | 90 days supply | Qty: 100 | Fill #1

## 2016-05-31 MED FILL — LISINOPRIL 10 MG TABLET: 10 | 15 days supply | Qty: 15 | Fill #0

## 2016-05-31 MED FILL — ATORVASTATIN 80 MG TABLET: 80 | 15 days supply | Qty: 15 | Fill #0

## 2016-06-10 MED FILL — CEPHALEXIN 500 MG CAPSULE: 500 | 10 days supply | Qty: 30 | Fill #0

## 2016-06-10 MED FILL — traMADol HCL 50 MG TABS: 50 | 8 days supply | Qty: 30 | Fill #0

## 2016-06-13 MED FILL — FENOFIBRATE 160 MG TABLET: 160 | 15 days supply | Qty: 15 | Fill #0

## 2016-06-23 MED FILL — ATORVASTATIN 80 MG TABLET: 80 | 15 days supply | Qty: 15 | Fill #0

## 2016-06-23 MED FILL — LISINOPRIL 10 MG TABLET: 10 | 15 days supply | Qty: 15 | Fill #0

## 2016-06-23 MED FILL — TRESIBA FLEXTOUCH 200 UNITS: 200 | 18 days supply | Qty: 9 | Fill #4

## 2016-07-08 MED FILL — DOXYCYCLINE HYCLATE 100 MG: 100 | 10 days supply | Qty: 20 | Fill #0

## 2016-07-12 MED FILL — FENOFIBRATE 160 MG TABLET: 160 | 15 days supply | Qty: 15 | Fill #0

## 2016-07-14 MED FILL — TRESIBA FLEXTOUCH 200 UNITS: 200 | 18 days supply | Qty: 9 | Fill #5

## 2016-07-26 ENCOUNTER — Ambulatory Visit: Payer: Self-pay

## 2016-07-29 DIAGNOSIS — Z794 Long term (current) use of insulin: Secondary | ICD-10-CM | POA: Diagnosis not present

## 2016-07-29 DIAGNOSIS — Z23 Encounter for immunization: Secondary | ICD-10-CM | POA: Diagnosis not present

## 2016-07-29 DIAGNOSIS — N529 Male erectile dysfunction, unspecified: Secondary | ICD-10-CM | POA: Diagnosis not present

## 2016-07-29 DIAGNOSIS — E1165 Type 2 diabetes mellitus with hyperglycemia: Secondary | ICD-10-CM | POA: Diagnosis not present

## 2016-07-29 DIAGNOSIS — Z5181 Encounter for therapeutic drug level monitoring: Secondary | ICD-10-CM | POA: Diagnosis not present

## 2016-07-29 DIAGNOSIS — Z6838 Body mass index (BMI) 38.0-38.9, adult: Secondary | ICD-10-CM | POA: Diagnosis not present

## 2016-07-29 DIAGNOSIS — Z7984 Long term (current) use of oral hypoglycemic drugs: Secondary | ICD-10-CM | POA: Diagnosis not present

## 2016-07-29 MED FILL — ACCU-CHEK GUIDE TEST STRIP: 30 days supply | Qty: 100 | Fill #0

## 2016-07-29 MED FILL — ACCU-CHEK FASTCLIX LANCETS: 29 days supply | Qty: 102 | Fill #0

## 2016-07-29 MED FILL — ATORVASTATIN 80 MG TABLET: 80 | 15 days supply | Qty: 15 | Fill #0

## 2016-07-29 MED FILL — metFORMIN HCL 1000 MG TABS: 1000 | 90 days supply | Qty: 180 | Fill #0

## 2016-07-29 MED FILL — LISINOPRIL 10 MG TABLET: 10 | 90 days supply | Qty: 90 | Fill #0

## 2016-07-29 MED FILL — TRESIBA FLEXTOUCH 200 UNITS: 200 | 18 days supply | Qty: 9 | Fill #0

## 2016-08-17 MED FILL — FENOFIBRATE 160 MG TABLET: 160 | 15 days supply | Qty: 15 | Fill #0

## 2016-08-17 MED FILL — TRESIBA FLEXTOUCH 200 UNITS: 200 | 18 days supply | Qty: 9 | Fill #1

## 2016-08-17 MED FILL — ATORVASTATIN 80 MG TABLET: 80 | 90 days supply | Qty: 90 | Fill #0

## 2016-08-25 DIAGNOSIS — H5213 Myopia, bilateral: Secondary | ICD-10-CM | POA: Diagnosis not present

## 2016-08-25 DIAGNOSIS — H52223 Regular astigmatism, bilateral: Secondary | ICD-10-CM | POA: Diagnosis not present

## 2016-08-25 DIAGNOSIS — H524 Presbyopia: Secondary | ICD-10-CM | POA: Diagnosis not present

## 2016-08-25 DIAGNOSIS — E119 Type 2 diabetes mellitus without complications: Secondary | ICD-10-CM | POA: Diagnosis not present

## 2016-08-29 DIAGNOSIS — B351 Tinea unguium: Secondary | ICD-10-CM | POA: Diagnosis not present

## 2016-08-29 DIAGNOSIS — Z5181 Encounter for therapeutic drug level monitoring: Secondary | ICD-10-CM | POA: Diagnosis not present

## 2016-08-29 DIAGNOSIS — E1165 Type 2 diabetes mellitus with hyperglycemia: Secondary | ICD-10-CM | POA: Diagnosis not present

## 2016-08-29 DIAGNOSIS — E119 Type 2 diabetes mellitus without complications: Secondary | ICD-10-CM | POA: Diagnosis not present

## 2016-08-29 DIAGNOSIS — N529 Male erectile dysfunction, unspecified: Secondary | ICD-10-CM | POA: Diagnosis not present

## 2016-08-29 DIAGNOSIS — Z6838 Body mass index (BMI) 38.0-38.9, adult: Secondary | ICD-10-CM | POA: Diagnosis not present

## 2016-08-29 DIAGNOSIS — Z794 Long term (current) use of insulin: Secondary | ICD-10-CM | POA: Diagnosis not present

## 2016-08-30 DIAGNOSIS — E119 Type 2 diabetes mellitus without complications: Secondary | ICD-10-CM | POA: Diagnosis not present

## 2016-08-30 DIAGNOSIS — Z794 Long term (current) use of insulin: Secondary | ICD-10-CM | POA: Diagnosis not present

## 2016-08-30 MED FILL — METFORMIN HCL ER 500 MG TAB: 500 | 90 days supply | Qty: 360 | Fill #0

## 2016-08-31 MED FILL — TRESIBA FLEXTOUCH 200 UNITS: 200 | 15 days supply | Qty: 9 | Fill #0

## 2016-09-01 MED FILL — TRULICITY 1.5 MG/0.5 ML PEN: 1.5 | 28 days supply | Qty: 2 | Fill #0

## 2016-09-22 MED FILL — FENOFIBRATE 160 MG TABLET: 160 | 7 days supply | Qty: 7 | Fill #0

## 2016-10-06 MED FILL — TRULICITY 1.5 MG/0.5 ML PEN: 1.5 | 28 days supply | Qty: 2 | Fill #1

## 2016-10-06 MED FILL — TRESIBA FLEXTOUCH 200 UNITS: 200 | 15 days supply | Qty: 9 | Fill #1

## 2016-11-01 MED FILL — TRESIBA FLEXTOUCH 200 UNITS: 200 | 15 days supply | Qty: 9 | Fill #2

## 2016-11-24 MED FILL — TRESIBA FLEXTOUCH 200 UNITS: 200 | 15 days supply | Qty: 9 | Fill #3

## 2016-12-19 MED FILL — TRESIBA FLEXTOUCH 200 UNITS: 200 | 15 days supply | Qty: 9 | Fill #4

## 2017-01-05 MED FILL — TRESIBA FLEXTOUCH 200 UNITS: 200 | 15 days supply | Qty: 9 | Fill #5

## 2017-01-05 MED FILL — NOVOFINE 32G NEEDLES: 32G X 6 MM | 90 days supply | Qty: 100 | Fill #0

## 2017-01-26 MED FILL — TRESIBA FLEXTOUCH 200 UNITS: 200 | 15 days supply | Qty: 9 | Fill #6

## 2017-02-01 DIAGNOSIS — B356 Tinea cruris: Secondary | ICD-10-CM | POA: Diagnosis not present

## 2017-02-01 DIAGNOSIS — L03032 Cellulitis of left toe: Secondary | ICD-10-CM | POA: Diagnosis not present

## 2017-02-01 DIAGNOSIS — Z794 Long term (current) use of insulin: Secondary | ICD-10-CM | POA: Diagnosis not present

## 2017-02-01 DIAGNOSIS — E1165 Type 2 diabetes mellitus with hyperglycemia: Secondary | ICD-10-CM | POA: Diagnosis not present

## 2017-02-02 MED FILL — LISINOPRIL 10 MG TABLET: 10 | 90 days supply | Qty: 90 | Fill #1

## 2017-02-16 MED FILL — TRESIBA FLEXTOUCH 200 UNITS: 200 | 15 days supply | Qty: 9 | Fill #7

## 2017-02-18 ENCOUNTER — Encounter (HOSPITAL_BASED_OUTPATIENT_CLINIC_OR_DEPARTMENT_OTHER): Payer: Self-pay | Admitting: Emergency Medicine

## 2017-02-18 ENCOUNTER — Emergency Department (HOSPITAL_BASED_OUTPATIENT_CLINIC_OR_DEPARTMENT_OTHER)
Admission: EM | Admit: 2017-02-18 | Discharge: 2017-02-18 | Disposition: A | Discharge status: Home / Self Care | Payer: 59 | Attending: Emergency Medicine | Admitting: Emergency Medicine

## 2017-02-18 DIAGNOSIS — I10 Essential (primary) hypertension: Secondary | ICD-10-CM | POA: Diagnosis not present

## 2017-02-18 DIAGNOSIS — Z794 Long term (current) use of insulin: Secondary | ICD-10-CM | POA: Insufficient documentation

## 2017-02-18 DIAGNOSIS — Z7982 Long term (current) use of aspirin: Secondary | ICD-10-CM | POA: Insufficient documentation

## 2017-02-18 DIAGNOSIS — L03114 Cellulitis of left upper limb: Secondary | ICD-10-CM | POA: Diagnosis not present

## 2017-02-18 DIAGNOSIS — E119 Type 2 diabetes mellitus without complications: Secondary | ICD-10-CM | POA: Insufficient documentation

## 2017-02-18 DIAGNOSIS — F17220 Nicotine dependence, chewing tobacco, uncomplicated: Secondary | ICD-10-CM | POA: Insufficient documentation

## 2017-02-18 DIAGNOSIS — Z79899 Other long term (current) drug therapy: Secondary | ICD-10-CM | POA: Diagnosis not present

## 2017-02-18 MED ORDER — DOXYCYCLINE HYCLATE 100 MG PO TABS
100.0000 mg | ORAL_TABLET | Freq: Once | ORAL | Status: AC
  Administered 2017-02-18: 100 mg via ORAL
  Filled 2017-02-18: qty 1

## 2017-02-18 MED ORDER — DOXYCYCLINE HYCLATE 100 MG PO CAPS: 100.0000 mg | ORAL_CAPSULE | Freq: Two times a day (BID) | ORAL | 0 refills | Status: AC

## 2017-02-18 NOTE — ED Provider Notes (Signed)
[facility] Provider Note   CSN: [id number] Arrival date & time: 02/18/17  2102     History   Chief Complaint Chief Complaint  Patient presents with  . Wound Check    HPI Martin [name] is a 51 y.o. male with h/o DM on insulin, obesity, tobacco use (chew), HLD presents to ED for evaluation of gradually worsening redness, warmth, tenderness to right dorsal aspect of left hand x 2 days. He has a small wound to center of this area but denies recent animal or insect bites. 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 No fevers, chills, sweats, pain with left wrist movement. States he finished antibiotics for toe cellulitis 3 days ago. No h/o gout. No h/o IVDU.   HPI  Past Medical History:  Diagnosis Date  . Chest pain   . Diabetes mellitus   . Gout   . Hyperlipidemia   . Hypertension   . Obesity     Patient Active Problem List   Diagnosis Date Noted  . Type 2 diabetes mellitus (HCC) 10/05/2015  . Chest pain 12/19/2014  . Hypertriglyceridemia 08/20/2012  . Gout 07/25/2012  . Essential hypertension, benign 07/25/2012  . Other and unspecified hyperlipidemia 07/25/2012  . Glaucoma 07/25/2012  . Type II or unspecified type diabetes mellitus without mention of complication, uncontrolled 11/30/2010    Past Surgical History:  Procedure Laterality Date  . UMBILICAL HERNIA REPAIR         Home Medications    Prior to Admission medications   Medication Sig Start Date End Date Taking? Authorizing Provider  aspirin 81 MG tablet Take 81 mg by mouth daily.      [provider]  atorvastatin (LIPITOR) 80 MG tablet Take 80 mg by mouth daily.    [provider]  doxycycline (VIBRAMYCIN) 100 MG capsule Take 1 capsule (100 mg total) by mouth 2 (two) times daily. 02/18/17 02/25/17  Liberty Handy, PA-C  Dulaglutide (TRULICITY) 1.5 MG/0.5ML SOPN Inject 1.5 mg into the skin once a week.     [provider]  fenofibrate 160 MG tablet Take 160 mg by mouth daily.    [provider]  gabapentin (NEURONTIN) 300 MG capsule Take 1 capsule (300 mg total) by mouth 3 (three) times daily. Patient taking differently: Take 300 mg by mouth 3 (three) times daily as needed.  10/05/13   Nelva Nay, MD  Insulin Degludec (TRESIBA FLEXTOUCH) 200 UNIT/ML SOPN Inject 80 Units into the skin daily.    [provider]  insulin glargine (LANTUS) 100 UNIT/ML injection Inject 100 Units into the skin at bedtime.  08/17/12   Carlus Pavlov, MD  lisinopril (PRINIVIL,ZESTRIL) 10 MG tablet Take 10 mg by mouth daily.    [provider]  METFORMIN HCL PO Take 2,000 mg by mouth daily.     [provider]  naproxen sodium (ANAPROX) 220 MG tablet Take 220 mg by mouth 3 (three) times daily with meals.    [provider]  omega-3 acid ethyl esters (LOVAZA) 1 G capsule TAKE 1 CAPSULE BY MOUTH 2 TIMES DAILY WITH MEALS 02/06/14   Carlus Pavlov, MD  [facility] VERIO test strip USE AS INSTRUCTED 2-3 X A DAY. 08/07/13   Carlus Pavlov, MD    Family History Family History  Problem Relation Age of Onset  . Diabetes Maternal Grandfather   . Kidney disease Other     Social History  Social History  Substance Use Topics  . Smoking status: Never Smoker  . Smokeless tobacco: Current User    Types: Chew  . Alcohol use 0.0 oz/week     Allergies   Patient has no known allergies.   Review of Systems Review of Systems  Constitutional: Negative for chills, diaphoresis and fever.  Musculoskeletal: Negative for arthralgias.  Skin: Positive for color change.  Neurological: Negative for weakness and numbness.     Physical Exam Updated Vital Signs BP (!) 146/78 (BP Location: Left Arm)   Pulse 77   Temp 98.5 F (36.9 C) (Oral)   Resp 18   Ht  (1.803 m)   Wt 120.7 kg (266 lb)   SpO2 100%   BMI 37.10 kg/m   Physical Exam  Constitutional: He is  oriented to person, place, and time. He appears well-developed and well-nourished. No distress.  NAD.  HENT:  Head: Normocephalic and atraumatic.  Right Ear: External ear normal.  Left Ear: External ear normal.  Nose: Nose normal.  Eyes: Conjunctivae and EOM are normal. No scleral icterus.  Neck: Normal range of motion. Neck supple.  Cardiovascular: Normal rate, regular rhythm, normal heart sounds and intact distal pulses.   No murmur heard. Pulmonary/Chest: Effort normal and breath sounds normal. He has no wheezes.  Musculoskeletal: Normal range of motion. He exhibits no deformity.  No focal tenderness to left wrist or scaphoid No tenderness over tendons on top/bottom hand Full AROM of left wrist without pain Pt able to make full fist with left hand, good thumb opposition.  Neurological: He is alert and oriented to person, place, and time.  Sensation to light touch intact in LUE in median, nerve and ulnar nerve distribution   Skin: Skin is warm and dry. Capillary refill takes less than 2 seconds. There is erythema.  Erythema, edema, tenderness to dorsal aspect of left hand with small crusted over ulcerated like lesion in the center. No fluctuance. Erythema edema does not extend past MCPs or wrist. <2 cap refill to left finger tips  Psychiatric: He has a normal mood and affect. His behavior is normal. Judgment and thought content normal.  Nursing note and vitals reviewed.    ED Treatments / Results  Labs (all labs ordered are listed, but only abnormal results are displayed) Labs Reviewed - No data to display  EKG  EKG Interpretation None       Radiology No results found.  Procedures Procedures (including critical care time)  Medications Ordered in ED Medications  doxycycline (VIBRA-TABS) tablet 100 mg (100 mg Oral Given 02/18/17 2308)     Initial Impression / Assessment and Plan / ED Course  I have reviewed the triage vital signs and the nursing notes.  Pertinent  labs & imaging results that were available during my care of the patient were reviewed by me and considered in my medical decision making (see chart for details).    51 year old male with history of diabetes on insulin presents to the ED with gradually worsening erythema, edema and tenderness to dorsal aspect of the left hand 2 days. Recently finished Augmentin for toe cellulitis 1 day before symptoms started. He has a crusted lesion to the center of his left hand. He works at a golf course, doing gardening, but denies noting animal/bites to the area. On exam there is no fluctuance or evidence of abscess amenable to incision and drainage. He has no focal tenderness to the left wrist or scaphoid, has full active range of  motion of the left wrist without pain. No tenderness over tendons of hand. Exam is not consistent with septic arthritis or tenosynovitis. No previous history of gout. Exam is not consistent with septic arthritis or gout. No IV drug use. No fevers, chills, sweats. No trauma. Not a prosthetic joint. History and exam most consistent with mild cellulitis. Emergent lab work and imaging not indicated today as no h/o trauma, fevers, chills. Very low suspicion for necrotizing infection. Area of erythema was marked. He will be d/c with doxy and PCP f/u in 3 days as he is high risk for worsening infection given h/o DM. Strict ED return precautions. Pt and wife are aware of s/s that would warrant return to ED for re-evaluation.   Final Clinical Impressions(s) / ED Diagnoses   Final diagnoses:  Cellulitis of left upper extremity    New Prescriptions Discharge Medication List as of 02/18/2017 10:47 PM    START taking these medications   Details  doxycycline (VIBRAMYCIN) 100 MG capsule Take 1 capsule (100 mg total) by mouth 2 (two) times daily., Starting Sat 02/18/2017, Until Sat 02/25/2017, Print         Liberty Handy, PA-C 02/19/17 1610    Alvira Monday, MD 02/22/17 1410

## 2017-02-18 NOTE — ED Notes (Signed)
Pt and wife concerned because they have have not seen doctor yet

## 2017-02-18 NOTE — Discharge Instructions (Signed)
You presented to the emergency department for swelling, pain, warmth to the top of your left hand.  On exam, there is no signs of abscess or collection of pus. You have cellulitis. Please take antibiotic as prescribed. Keep the lesion on top of your hand clean with clean soap and water at least twice daily. You may apply thin layer of antibiotic ointment to the lesion and keep covered especially during work. Continue doing warm Epsom salt soaks. Alternatively, you may place a heating pad on top of your hand.  Monitor your symptoms. Follow up with your primary care provider in 3 days for wound check. You have diabetes and are at increased risk of worsening infection. The redness, swelling, pain should not expand past the black marker after 3 days of taking antibiotics. Return to the ED if you develop fevers, chills, inability to move the wrist due to severe pain.

## 2017-02-18 NOTE — ED Notes (Signed)
Pt reports finishing antibiotics this week for cellulitis to his foot.

## 2017-02-18 NOTE — ED Triage Notes (Signed)
PT presents with c/o pain, redness and swelling to left wrist

## 2017-03-03 MED FILL — TERBINAFINE HCL 250 MG TAB: 250 | 28 days supply | Qty: 28 | Fill #0

## 2017-03-03 MED FILL — CLOTRIMAZOLE-BETAMETHASONE: 1-0.05 | 20 days supply | Qty: 45 | Fill #0

## 2017-03-06 MED FILL — TRESIBA FLEXTOUCH 200 UNITS: 200 | 15 days supply | Qty: 9 | Fill #8

## 2017-03-24 MED FILL — ATORVASTATIN 80 MG TABLET: 80 | 90 days supply | Qty: 90 | Fill #1

## 2017-04-07 MED FILL — TRESIBA FLEXTOUCH 200 UNITS: 200 | 15 days supply | Qty: 9 | Fill #9

## 2017-04-11 DIAGNOSIS — N529 Male erectile dysfunction, unspecified: Secondary | ICD-10-CM | POA: Diagnosis not present

## 2017-04-11 DIAGNOSIS — Z794 Long term (current) use of insulin: Secondary | ICD-10-CM | POA: Diagnosis not present

## 2017-04-11 DIAGNOSIS — E11621 Type 2 diabetes mellitus with foot ulcer: Secondary | ICD-10-CM | POA: Diagnosis not present

## 2017-04-11 DIAGNOSIS — E1165 Type 2 diabetes mellitus with hyperglycemia: Secondary | ICD-10-CM | POA: Diagnosis not present

## 2017-04-11 DIAGNOSIS — B351 Tinea unguium: Secondary | ICD-10-CM | POA: Diagnosis not present

## 2017-04-11 DIAGNOSIS — Z5181 Encounter for therapeutic drug level monitoring: Secondary | ICD-10-CM | POA: Diagnosis not present

## 2017-04-20 MED FILL — TRESIBA FLEXTOUCH 200 UNITS: 200 | 30 days supply | Qty: 18 | Fill #0

## 2017-05-02 ENCOUNTER — Other Ambulatory Visit: Payer: Self-pay

## 2017-05-02 NOTE — Patient Outreach (Signed)
[facility] Care Management  05/02/2017  [name] 11-17-1965 [id number]   Telephone call to inform of closing of Link to Wellness case and transition to [employer].  Instructed that he will be transitioned to [employer] in 2019 for disease management and the Link to Wellness program will be closed. Instructed that he will be contacted by [employer] by phone in January 2019.   Instructed that he will continue to receive the pharmacy benefit.    [employer] will contact member in January to continue diabetes disease management. Case closed for Link to Wellness as member will be enrolled in an external program. Member and provider to be sent letter on transition to [employer] Dudley MajorMelissa Dedee Liss RN, [facility] HBSN,CCM Care Management Coordinator-Link to Wellness [facility] Care Management [phone]

## 2017-05-26 MED FILL — TRESIBA FLEXTOUCH 200 UNITS: 200 | 30 days supply | Qty: 18 | Fill #1

## 2017-06-01 MED FILL — LISINOPRIL 10 MG TABS: 10 | 90 days supply | Qty: 90 | Fill #2

## 2017-06-01 MED FILL — metFORMIN HCL 1000 MG TABS: 1000 | 90 days supply | Qty: 180 | Fill #1

## 2017-06-18 ENCOUNTER — Other Ambulatory Visit: Payer: Self-pay

## 2017-06-18 ENCOUNTER — Emergency Department (HOSPITAL_BASED_OUTPATIENT_CLINIC_OR_DEPARTMENT_OTHER): Payer: 59

## 2017-06-18 ENCOUNTER — Encounter (HOSPITAL_BASED_OUTPATIENT_CLINIC_OR_DEPARTMENT_OTHER): Payer: Self-pay | Admitting: *Deleted

## 2017-06-18 ENCOUNTER — Emergency Department (HOSPITAL_BASED_OUTPATIENT_CLINIC_OR_DEPARTMENT_OTHER)
Admission: EM | Admit: 2017-06-18 | Discharge: 2017-06-18 | Disposition: A | Discharge status: Home / Self Care | Payer: 59 | Attending: Emergency Medicine | Admitting: Emergency Medicine

## 2017-06-18 DIAGNOSIS — M79662 Pain in left lower leg: Secondary | ICD-10-CM | POA: Diagnosis not present

## 2017-06-18 DIAGNOSIS — L539 Erythematous condition, unspecified: Secondary | ICD-10-CM | POA: Diagnosis not present

## 2017-06-18 DIAGNOSIS — Z79899 Other long term (current) drug therapy: Secondary | ICD-10-CM | POA: Insufficient documentation

## 2017-06-18 DIAGNOSIS — L03116 Cellulitis of left lower limb: Secondary | ICD-10-CM | POA: Insufficient documentation

## 2017-06-18 DIAGNOSIS — M7989 Other specified soft tissue disorders: Secondary | ICD-10-CM | POA: Diagnosis not present

## 2017-06-18 DIAGNOSIS — E119 Type 2 diabetes mellitus without complications: Secondary | ICD-10-CM | POA: Insufficient documentation

## 2017-06-18 DIAGNOSIS — Z794 Long term (current) use of insulin: Secondary | ICD-10-CM | POA: Insufficient documentation

## 2017-06-18 DIAGNOSIS — Z7982 Long term (current) use of aspirin: Secondary | ICD-10-CM | POA: Insufficient documentation

## 2017-06-18 DIAGNOSIS — I10 Essential (primary) hypertension: Secondary | ICD-10-CM | POA: Insufficient documentation

## 2017-06-18 LAB — CBC WITH DIFFERENTIAL/PLATELET
Basophils Absolute: 0.1 10*3/uL (ref 0.0–0.1)
Basophils Relative: 0 %
EOS ABS: 0.2 10*3/uL (ref 0.0–0.7)
EOS PCT: 1 %
HCT: 38.5 % — ABNORMAL LOW (ref 39.0–52.0)
Hemoglobin: 13.3 g/dL (ref 13.0–17.0)
Lymphocytes Relative: 10 %
Lymphs Abs: 1.8 10*3/uL (ref 0.7–4.0)
MCH: 28.3 pg (ref 26.0–34.0)
MCHC: 34.5 g/dL (ref 30.0–36.0)
MCV: 81.9 fL (ref 78.0–100.0)
MONOS PCT: 12 %
Monocytes Absolute: 2.2 10*3/uL — ABNORMAL HIGH (ref 0.1–1.0)
Neutro Abs: 14.7 10*3/uL — ABNORMAL HIGH (ref 1.7–7.7)
Neutrophils Relative %: 77 %
PLATELETS: 205 10*3/uL (ref 150–400)
RBC: 4.7 MIL/uL (ref 4.22–5.81)
RDW: 12.7 % (ref 11.5–15.5)
WBC: 18.9 10*3/uL — AB (ref 4.0–10.5)

## 2017-06-18 LAB — BASIC METABOLIC PANEL
Anion gap: 7 (ref 5–15)
BUN: 13 mg/dL (ref 6–20)
CO2: 23 mmol/L (ref 22–32)
CREATININE: 0.76 mg/dL (ref 0.61–1.24)
Calcium: 8.7 mg/dL — ABNORMAL LOW (ref 8.9–10.3)
Chloride: 103 mmol/L (ref 101–111)
GFR calc non Af Amer: >60 mL/min (ref >60)
Glucose, Bld: 133 mg/dL — ABNORMAL HIGH (ref 65–99)
Potassium: 3.7 mmol/L (ref 3.5–5.1)
Sodium: 133 mmol/L — ABNORMAL LOW (ref 135–145)

## 2017-06-18 MED ORDER — CLINDAMYCIN HCL 300 MG PO CAPS: 300.0000 mg | ORAL_CAPSULE | Freq: Three times a day (TID) | ORAL | 0 refills | Status: DC

## 2017-06-18 MED ORDER — CLINDAMYCIN PHOSPHATE 600 MG/50ML IV SOLN
600.0000 mg | Freq: Once | INTRAVENOUS | Status: AC
  Administered 2017-06-18: 600 mg via INTRAVENOUS
  Filled 2017-06-18: qty 50

## 2017-06-18 NOTE — ED Provider Notes (Signed)
[facility] EMERGENCY DEPARTMENT Provider Note   CSN: [id number] Arrival date & time: 06/18/17  1146     History   Chief Complaint Chief Complaint  Patient presents with  . Leg Pain    HPI Martin [name] is a 52 y.o. male history of diabetes, hypertension, HL, here presenting with left calf pain and redness.  Patient states that he noticed progressive redness and calf pain for the last 2-3 days.  States that he had previous cellulitis that was similar before.  Denies any fevers but has some subjective chills at home.  Denies any recent travel or history of blood clots.  Denies any chest pain or shortness of breath.   The history is provided by the patient.    Past Medical History:  Diagnosis Date  . Chest pain   . Diabetes mellitus   . Gout   . Hyperlipidemia   . Hypertension   . Obesity     Patient Active Problem List   Diagnosis Date Noted  . Type 2 diabetes mellitus (HCC) 10/05/2015  . Chest pain 12/19/2014  . Hypertriglyceridemia 08/20/2012  . Gout 07/25/2012  . Essential hypertension, benign 07/25/2012  . Other and unspecified hyperlipidemia 07/25/2012  . Glaucoma 07/25/2012  . Type II or unspecified type diabetes mellitus without mention of complication, uncontrolled 11/30/2010    Past Surgical History:  Procedure Laterality Date  . UMBILICAL HERNIA REPAIR         Home Medications    Prior to Admission medications   Medication Sig Start Date End Date Taking? Authorizing Provider  aspirin 81 MG tablet Take 81 mg by mouth daily.      [provider]  atorvastatin (LIPITOR) 80 MG tablet Take 80 mg by mouth daily.    [provider]  Dulaglutide (TRULICITY) 1.5 MG/0.5ML SOPN Inject 1.5 mg into the skin once a week.    [provider]  fenofibrate 160 MG tablet Take 160 mg by mouth daily.    [provider]  gabapentin (NEURONTIN) 300 MG capsule Take 1 capsule (300 mg total) by mouth 3 (three) times  daily. Patient taking differently: Take 300 mg by mouth 3 (three) times daily as needed.  10/05/13   Nelva Nay, MD  Insulin Degludec (TRESIBA FLEXTOUCH) 200 UNIT/ML SOPN Inject 80 Units into the skin daily.    [provider]  insulin glargine (LANTUS) 100 UNIT/ML injection Inject 100 Units into the skin at bedtime.  08/17/12   Carlus Pavlov, MD  lisinopril (PRINIVIL,ZESTRIL) 10 MG tablet Take 10 mg by mouth daily.    [provider]  METFORMIN HCL PO Take 2,000 mg by mouth daily.     [provider]  naproxen sodium (ANAPROX) 220 MG tablet Take 220 mg by mouth 3 (three) times daily with meals.    [provider]  omega-3 acid ethyl esters (LOVAZA) 1 G capsule TAKE 1 CAPSULE BY MOUTH 2 TIMES DAILY WITH MEALS 02/06/14   Carlus Pavlov, MD  [facility] VERIO test strip USE AS INSTRUCTED 2-3 X A DAY. 08/07/13   Carlus Pavlov, MD    Family History Family History  Problem Relation Age of Onset  . Diabetes Maternal Grandfather   . Kidney disease Other     Social History Social History   Tobacco Use  . Smoking status: Never Smoker  . Smokeless tobacco: Current User    Types: Chew  Substance Use Topics  . Alcohol use: Yes    Alcohol/week: 0.0 oz  .  Drug use: No     Allergies   Patient has no known allergies.   Review of Systems Review of Systems  Musculoskeletal:       L calf pain   All other systems reviewed and are negative.    Physical Exam Updated Vital Signs BP 131/77 (BP Location: Left Arm)   Pulse 74   Temp 97.7 F (36.5 C) (Oral)   Resp 18   Ht 5\' 11"  (1.803 m)   Wt 108.9 kg (240 lb)   SpO2 100%   BMI 33.47 kg/m   Physical Exam  Constitutional: He appears well-developed and well-nourished.  HENT:  Head: Normocephalic.  Mouth/Throat: Oropharynx is clear and moist.  Eyes: Conjunctivae and EOM are normal. Pupils are equal, round, and reactive to light.  Neck: Normal range of motion. Neck supple.  Cardiovascular:  Normal rate, regular rhythm and normal heart sounds.  Pulmonary/Chest: Effort normal and breath sounds normal. No respiratory distress.  Abdominal: Soft. Bowel sounds are normal.  Musculoskeletal:  L posterior calf with some swelling and redness. ? Thrombophlebitis, + L calf tenderness. 2+ pulses, no popliteal tenderness or swelling   Skin: Skin is warm. There is erythema.  Psychiatric: He has a normal mood and affect.  Nursing note and vitals reviewed.    ED Treatments / Results  Labs (all labs ordered are listed, but only abnormal results are displayed) Labs Reviewed  CBC WITH DIFFERENTIAL/PLATELET - Abnormal; Notable for the following components:      Result Value   WBC 18.9 (*)    HCT 38.5 (*)    Neutro Abs 14.7 (*)    Monocytes Absolute 2.2 (*)    All other components within normal limits  BASIC METABOLIC PANEL - Abnormal; Notable for the following components:   Sodium 133 (*)    Glucose, Bld 133 (*)    Calcium 8.7 (*)    All other components within normal limits    EKG  EKG Interpretation None       Radiology US Venous Img Lower  Left (dvt Study)  Result Date: 06/18/2017 CLINICAL DATA:  Left calf pain, redness, swelling EXAM: LEFT LOWER EXTREMITY VENOUS DOPPLER ULTRASOUND TECHNIQUE: Gray-scale sonography with graded compression, as well as color Doppler and duplex ultrasound were performed to evaluate the lower extremity deep venous systems from the level of the common femoral vein and including the common femoral, femoral, profunda femoral, popliteal and calf veins including the posterior tibial, peroneal and gastrocnemius veins when visible. The superficial great saphenous vein was also interrogated. Spectral Doppler was utilized to evaluate flow at rest and with distal augmentation maneuvers in the common femoral, femoral and popliteal veins. COMPARISON:  None. FINDINGS: Contralateral Common Femoral Vein: Respiratory phasicity is normal and symmetric with the  symptomatic side. No evidence of thrombus. Normal compressibility. Common Femoral Vein: No evidence of thrombus. Normal compressibility, respiratory phasicity and response to augmentation. Saphenofemoral Junction: No evidence of thrombus. Normal compressibility and flow on color Doppler imaging. Profunda Femoral Vein: No evidence of thrombus. Normal compressibility and flow on color Doppler imaging. Femoral Vein: No evidence of thrombus. Normal compressibility, respiratory phasicity and response to augmentation. Popliteal Vein: No evidence of thrombus. Normal compressibility, respiratory phasicity and response to augmentation. Calf Veins: Not well visualized due to edema in the calf. Superficial Great Saphenous Vein: No evidence of thrombus. Normal compressibility. Venous Reflux:  None. Other Findings: Mildly prominent right inguinal lymph nodes, the largest with a short axis diameter of 13 mm. IMPRESSION: No evidence  of deep venous thrombosis. Mildly prominent right inguinal lymph nodes. These could be followed clinically. Electronically Signed   By: Charlett NoseKevin  Dover M.D.   On: 06/18/2017 12:56    Procedures Procedures (including critical care time)  Medications Ordered in ED Medications  clindamycin (CLEOCIN) IVPB 600 mg (600 mg Intravenous New Bag/Given 06/18/17 1250)     Initial Impression / Assessment and Plan / ED Course  I have reviewed the triage vital signs and the nursing notes.  Pertinent labs & imaging results that were available during my care of the patient were reviewed by me and considered in my medical decision making (see chart for details).     Doreen SalvageJonathan G Addison is a 52 y.o. male here with L calf pain. Patient is a diabetic and had previous skin infections. No known MRSA infection and no fevers at home. Afebrile in the ED. Consider mild cellulitis vs thrombophlebitis vs DVT. Will get labs, DVT study.   1:06 PM WBC 18. DVT study neg, just enlarged lymph node which is consistent with  cellulitis. Afebrile in the ED. Given clinda, will dc home with the same.    Final Clinical Impressions(s) / ED Diagnoses   Final diagnoses:  None    ED Discharge Orders    None       Charlynne PanderYao, David Hsienta, MD 06/18/17 [phone]

## 2017-06-18 NOTE — ED Notes (Signed)
Patient transported to Ultrasound 

## 2017-06-18 NOTE — Discharge Instructions (Signed)
Take clindamycin three times daily for a week   See your doctor.   Rest for 2 days   Return to ER if you have fever, worse redness or pain, worse leg swelling

## 2017-06-18 NOTE — ED Triage Notes (Signed)
Left calf pain with redness, swelling x several days.  Denies recent travel.

## 2017-06-20 ENCOUNTER — Emergency Department (HOSPITAL_BASED_OUTPATIENT_CLINIC_OR_DEPARTMENT_OTHER)
Admission: EM | Admit: 2017-06-20 | Discharge: 2017-06-20 | Disposition: A | Discharge status: Home / Self Care | Payer: 59 | Attending: Emergency Medicine | Admitting: Emergency Medicine

## 2017-06-20 ENCOUNTER — Emergency Department (HOSPITAL_BASED_OUTPATIENT_CLINIC_OR_DEPARTMENT_OTHER): Payer: 59

## 2017-06-20 ENCOUNTER — Encounter (HOSPITAL_BASED_OUTPATIENT_CLINIC_OR_DEPARTMENT_OTHER): Payer: Self-pay | Admitting: Emergency Medicine

## 2017-06-20 ENCOUNTER — Other Ambulatory Visit: Payer: Self-pay

## 2017-06-20 DIAGNOSIS — F17220 Nicotine dependence, chewing tobacco, uncomplicated: Secondary | ICD-10-CM | POA: Diagnosis not present

## 2017-06-20 DIAGNOSIS — E119 Type 2 diabetes mellitus without complications: Secondary | ICD-10-CM | POA: Insufficient documentation

## 2017-06-20 DIAGNOSIS — M93962 Osteochondropathy, unspecified, left lower leg: Secondary | ICD-10-CM | POA: Insufficient documentation

## 2017-06-20 DIAGNOSIS — Z794 Long term (current) use of insulin: Secondary | ICD-10-CM | POA: Diagnosis not present

## 2017-06-20 DIAGNOSIS — M25562 Pain in left knee: Secondary | ICD-10-CM | POA: Diagnosis not present

## 2017-06-20 DIAGNOSIS — Z79899 Other long term (current) drug therapy: Secondary | ICD-10-CM | POA: Insufficient documentation

## 2017-06-20 DIAGNOSIS — Z7982 Long term (current) use of aspirin: Secondary | ICD-10-CM | POA: Insufficient documentation

## 2017-06-20 DIAGNOSIS — M9390 Osteochondropathy, unspecified of unspecified site: Secondary | ICD-10-CM

## 2017-06-20 DIAGNOSIS — I10 Essential (primary) hypertension: Secondary | ICD-10-CM | POA: Diagnosis not present

## 2017-06-20 MED ORDER — DICLOFENAC SODIUM ER 100 MG PO TB24: 100.0000 mg | ORAL_TABLET | Freq: Every day | ORAL | 0 refills | Status: DC

## 2017-06-20 MED ORDER — KETOROLAC TROMETHAMINE 60 MG/2ML IM SOLN
30.0000 mg | Freq: Once | INTRAMUSCULAR | Status: AC
  Administered 2017-06-20: 30 mg via INTRAMUSCULAR
  Filled 2017-06-20: qty 2

## 2017-06-20 NOTE — ED Notes (Signed)
Patient transported to X-ray 

## 2017-06-20 NOTE — ED Triage Notes (Addendum)
Pain to left calf area and tonight developed "knot" to left knee area . Was eval in ED on Sunday and dx with cellulitis.

## 2017-06-20 NOTE — ED Provider Notes (Signed)
[facility] EMERGENCY DEPARTMENT Provider Note   CSN: [id number] Arrival date & time: 06/20/17  0411     History   Chief Complaint Chief Complaint  Patient presents with  . Leg Pain    HPI Martin [name] is a 52 y.o. male.  The history is provided by the patient.  Knee Pain   This is a new problem. The current episode started 3 to 5 hours ago. The problem occurs constantly. The problem has not changed since onset.The pain is present in the left knee. The pain is moderate. Pertinent negatives include no numbness, full range of motion, no tingling and no itching. Associated symptoms comments: A "bump" that developed at work. He has tried nothing for the symptoms. The treatment provided no relief. Family history is significant for gout.  Seen for cellulitis on Sunday and ruled out for a DVT and has been taking his clindamycin with improvement.  Went back to work and noted a "bump" developed immediately inferior and medial to the patella of the L knee.  1.25 cm.  No warmth no redness.  Does not recall trauma.  But does a lot of lifting at his job at [employer].    Past Medical History:  Diagnosis Date  . Chest pain   . Diabetes mellitus   . Gout   . Hyperlipidemia   . Hypertension   . Obesity     Patient Active Problem List   Diagnosis Date Noted  . Type 2 diabetes mellitus (HCC) 10/05/2015  . Chest pain 12/19/2014  . Hypertriglyceridemia 08/20/2012  . Gout 07/25/2012  . Essential hypertension, benign 07/25/2012  . Other and unspecified hyperlipidemia 07/25/2012  . Glaucoma 07/25/2012  . Type II or unspecified type diabetes mellitus without mention of complication, uncontrolled 11/30/2010    Past Surgical History:  Procedure Laterality Date  . UMBILICAL HERNIA REPAIR         Home Medications    Prior to Admission medications   Medication Sig Start Date End Date Taking? Authorizing Provider  clindamycin (CLEOCIN) 300 MG capsule Take 1 capsule (300 mg total)  by mouth 3 (three) times daily. X 7 days 06/18/17  Yes Charlynne Pander, MD  aspirin 81 MG tablet Take 81 mg by mouth daily.      [provider]  atorvastatin (LIPITOR) 80 MG tablet Take 80 mg by mouth daily.    [provider]  Dulaglutide (TRULICITY) 1.5 MG/0.5ML SOPN Inject 1.5 mg into the skin once a week.    [provider]  fenofibrate 160 MG tablet Take 160 mg by mouth daily.    [provider]  gabapentin (NEURONTIN) 300 MG capsule Take 1 capsule (300 mg total) by mouth 3 (three) times daily. Patient taking differently: Take 300 mg by mouth 3 (three) times daily as needed.  10/05/13   Nelva Nay, MD  Insulin Degludec (TRESIBA FLEXTOUCH) 200 UNIT/ML SOPN Inject 80 Units into the skin daily.    [provider]  insulin glargine (LANTUS) 100 UNIT/ML injection Inject 100 Units into the skin at bedtime.  08/17/12   Carlus Pavlov, MD  lisinopril (PRINIVIL,ZESTRIL) 10 MG tablet Take 10 mg by mouth daily.    [provider]  METFORMIN HCL PO Take 2,000 mg by mouth daily.     [provider]  naproxen sodium (ANAPROX) 220 MG tablet Take 220 mg by mouth 3 (three) times daily with meals.    [provider]  omega-3 acid ethyl esters (LOVAZA) 1  G capsule TAKE 1 CAPSULE BY MOUTH 2 TIMES DAILY WITH MEALS 02/06/14   Carlus Pavlov, MD  [facility] VERIO test strip USE AS INSTRUCTED 2-3 X A DAY. 08/07/13   Carlus Pavlov, MD    Family History Family History  Problem Relation Age of Onset  . Diabetes Maternal Grandfather   . Kidney disease Other     Social History Social History   Tobacco Use  . Smoking status: Never Smoker  . Smokeless tobacco: Current User    Types: Chew  Substance Use Topics  . Alcohol use: Yes    Alcohol/week: 0.0 oz  . Drug use: No     Allergies   Patient has no known allergies.   Review of Systems Review of Systems  Constitutional: Negative for fever.  Respiratory: Negative for  shortness of breath.   Cardiovascular: Negative for chest pain and leg swelling.  Musculoskeletal: Positive for arthralgias. Negative for myalgias.  Skin: Negative for itching.  Neurological: Negative for tingling and numbness.  All other systems reviewed and are negative.    Physical Exam Updated Vital Signs BP (!) 155/85 (BP Location: Left Arm)   Pulse 87   Temp 97.8 F (36.6 C) (Oral)   Resp (!) 22   Ht 5\' 11"  (1.803 m)   Wt 108.9 kg (240 lb)   SpO2 100%   BMI 33.47 kg/m   Physical Exam  Constitutional: He is oriented to person, place, and time. He appears well-developed and well-nourished. No distress.  HENT:  Head: Normocephalic and atraumatic.  Mouth/Throat: No oropharyngeal exudate.  Eyes: Conjunctivae are normal. Pupils are equal, round, and reactive to light.  Neck: Normal range of motion. Neck supple.  Cardiovascular: Normal rate, regular rhythm, normal heart sounds and intact distal pulses.  Pulmonary/Chest: Effort normal and breath sounds normal. No stridor. He has no wheezes. He has no rales.  Abdominal: Soft. Bowel sounds are normal. He exhibits no mass. There is no tenderness. There is no rebound and no guarding.  Musculoskeletal: Normal range of motion.       Left knee: He exhibits normal range of motion, no swelling, no effusion, no ecchymosis, no deformity, no laceration, no erythema, normal alignment, no LCL laxity, normal patellar mobility, no bony tenderness, normal meniscus and no MCL laxity. No tenderness found. No medial joint line, no lateral joint line, no MCL, no LCL and no patellar tendon tenderness noted.       Left ankle: Normal. Achilles tendon normal.       Left lower leg: He exhibits no tenderness, no bony tenderness, no swelling, no edema and no deformity.       Legs: Pain over tibial tubercle  Neurological: He is alert and oriented to person, place, and time. He displays normal reflexes.  Skin: Skin is warm and dry. Capillary refill takes less  than 2 seconds.     ED Treatments / Results  Labs (all labs ordered are listed, but only abnormal results are displayed) Labs Reviewed - No data to display  EKG  EKG Interpretation None       Radiology US Venous Img Lower  Left (dvt Study)  Result Date: 06/18/2017 CLINICAL DATA:  Left calf pain, redness, swelling EXAM: LEFT LOWER EXTREMITY VENOUS DOPPLER ULTRASOUND TECHNIQUE: Gray-scale sonography with graded compression, as well as color Doppler and duplex ultrasound were performed to evaluate the lower extremity deep venous systems from the level of the common femoral vein and including the common femoral, femoral, profunda femoral, popliteal and calf  veins including the posterior tibial, peroneal and gastrocnemius veins when visible. The superficial great saphenous vein was also interrogated. Spectral Doppler was utilized to evaluate flow at rest and with distal augmentation maneuvers in the common femoral, femoral and popliteal veins. COMPARISON:  None. FINDINGS: Contralateral Common Femoral Vein: Respiratory phasicity is normal and symmetric with the symptomatic side. No evidence of thrombus. Normal compressibility. Common Femoral Vein: No evidence of thrombus. Normal compressibility, respiratory phasicity and response to augmentation. Saphenofemoral Junction: No evidence of thrombus. Normal compressibility and flow on color Doppler imaging. Profunda Femoral Vein: No evidence of thrombus. Normal compressibility and flow on color Doppler imaging. Femoral Vein: No evidence of thrombus. Normal compressibility, respiratory phasicity and response to augmentation. Popliteal Vein: No evidence of thrombus. Normal compressibility, respiratory phasicity and response to augmentation. Calf Veins: Not well visualized due to edema in the calf. Superficial Great Saphenous Vein: No evidence of thrombus. Normal compressibility. Venous Reflux:  None. Other Findings: Mildly prominent right inguinal lymph nodes,  the largest with a short axis diameter of 13 mm. IMPRESSION: No evidence of deep venous thrombosis. Mildly prominent right inguinal lymph nodes. These could be followed clinically. Electronically Signed   By: Charlett NoseKevin  Dover M.D.   On: 06/18/2017 12:56    Procedures Procedures (including critical care time)  Medications Ordered in ED Medications  ketorolac (TORADOL) injection 30 mg (not administered)       Final Clinical Impressions(s) / ED Diagnoses   Location of inflammation is over tibial tubercle, as in apophysitis.  Patient would like extended period out of work.  States last time something like this happened he was written out of work for six days. I suspect he did not want to go back to work this evening following being out x 2 days for cellulitis.  EDP stated we would supply patient with knee sleeve, ice and RX for NSAIDs and give a note for work for today and to return on the 17th.  If he needs longer he will need to be seen in follow up by his PMD and or orthopedics.  if symptoms persistent  Will apply knee sleeve and have patient follow up with orthopedics.  Follow up with PMD for recheck of cellulitis.    Return for worsening pain, fevers > 100.4 unrelieved by medication, shortness of breath, intractable vomiting, or diarrhea, abdominal pain, Inability to tolerate liquids or food, cough, altered mental status or any concerns. No signs of systemic illness or infection. The patient is nontoxic-appearing on exam and vital signs are within normal limits.    I have reviewed the triage vital signs and the nursing notes. Pertinent labs &imaging results that were available during my care of the patient were reviewed by me and considered in my medical decision making (see chart for details).  After history, exam, and medical workup I feel the patient has been appropriately medically screened and is safe for discharge home. Pertinent diagnoses were discussed with the patient. Patient was  given return precautions   Teosha Casso, MD 06/20/17 [id number]

## 2017-06-26 MED FILL — TRESIBA FLEXTOUCH 200 UNITS: 200 | 30 days supply | Qty: 18 | Fill #2

## 2017-07-19 ENCOUNTER — Ambulatory Visit (INDEPENDENT_AMBULATORY_CARE_PROVIDER_SITE_OTHER): Payer: 59

## 2017-07-19 ENCOUNTER — Ambulatory Visit: Payer: 59 | Admitting: Podiatry

## 2017-07-19 DIAGNOSIS — M779 Enthesopathy, unspecified: Secondary | ICD-10-CM | POA: Diagnosis not present

## 2017-07-19 DIAGNOSIS — L97522 Non-pressure chronic ulcer of other part of left foot with fat layer exposed: Secondary | ICD-10-CM | POA: Diagnosis not present

## 2017-07-19 DIAGNOSIS — M2042 Other hammer toe(s) (acquired), left foot: Secondary | ICD-10-CM

## 2017-07-19 DIAGNOSIS — M7751 Other enthesopathy of right foot: Secondary | ICD-10-CM

## 2017-07-19 DIAGNOSIS — I70245 Atherosclerosis of native arteries of left leg with ulceration of other part of foot: Secondary | ICD-10-CM

## 2017-07-19 DIAGNOSIS — M2041 Other hammer toe(s) (acquired), right foot: Secondary | ICD-10-CM | POA: Diagnosis not present

## 2017-07-19 DIAGNOSIS — M659 Synovitis and tenosynovitis, unspecified: Secondary | ICD-10-CM | POA: Diagnosis not present

## 2017-07-19 DIAGNOSIS — E0843 Diabetes mellitus due to underlying condition with diabetic autonomic (poly)neuropathy: Secondary | ICD-10-CM | POA: Diagnosis not present

## 2017-07-19 MED ORDER — DOXYCYCLINE HYCLATE 100 MG PO TABS: 100.0000 mg | ORAL_TABLET | Freq: Two times a day (BID) | ORAL | 0 refills | Status: DC

## 2017-07-19 MED ORDER — GENTAMICIN SULFATE 0.1 % EX CREA: 1.0000 "application " | TOPICAL_CREAM | Freq: Three times a day (TID) | CUTANEOUS | 1 refills | Status: DC

## 2017-07-19 MED FILL — GENTAMICIN 0.1% CREAM: 0.1 | 14 days supply | Qty: 30 | Fill #0

## 2017-07-19 MED FILL — DOXYCYCLINE HYCLATE 100 MG: 100 | 10 days supply | Qty: 20 | Fill #0

## 2017-07-20 MED FILL — TRESIBA FLEXTOUCH 200 UNITS: 200 | 30 days supply | Qty: 18 | Fill #3

## 2017-07-22 LAB — WOUND CULTURE
MICRO NUMBER: 90193358
SPECIMEN QUALITY:: ADEQUATE

## 2017-07-23 NOTE — Progress Notes (Signed)
   Subjective:  52 year old male with PMHx of T2DM presenting today as a new patient with a chief complaint of a lesion to the third digit of the left foot that has been present for several months. He also reports associated right ankle pain with swelling. Bearing weight and walking increases the symptoms. He has tried wearing good, supportive shoes with no significant relief. Patient presents today for further treatment and evaluation.   Past Medical History:  Diagnosis Date  . Chest pain   . Diabetes mellitus   . Gout   . Hyperlipidemia   . Hypertension   . Obesity       Objective/Physical Exam General: The patient is alert and oriented x3 in no acute distress.  Dermatology:  Wound #1 noted to the left third toe measuring 0.5 x 0.5 x 0.1 cm (LxWxD).   To the noted ulceration(s), there is no eschar. There is a moderate amount of slough, fibrin, and necrotic tissue noted. Granulation tissue and wound base is red. There is a minimal amount of serosanguineous drainage noted. There is no exposed bone muscle-tendon ligament or joint. There is no malodor. Periwound integrity is intact. Skin is warm, dry and supple bilateral lower extremities.  Vascular: Palpable pedal pulses bilaterally. Erythema localized to the third toe of the left foot. Capillary refill within normal limits.  Neurological: Epicritic and protective threshold absent bilaterally.   Musculoskeletal Exam: Pain on palpation to the anterior lateral medial aspects of the patient's right ankle. Mild edema noted. Range of motion within normal limits to all pedal and ankle joints bilateral. Muscle strength 5/5 in all groups bilateral.   Radiographic Exam:  Normal osseous mineralization. Joint spaces preserved. No fracture/dislocation/boney destruction.    Assessment: #1 ulceration to the left third toe secondary to diabetes mellitus #2 diabetes mellitus w/ peripheral neuropathy #3 ankle synovitis/DJD right #4 localized 3rd  left toe cellulitis    Plan of Care:  #1 Patient was evaluated. X-Rays reviewed.  #2 medically necessary excisional debridement including subcutaneous tissue was performed using a tissue nipper and a chisel blade. Excisional debridement of all the necrotic nonviable tissue down to healthy bleeding viable tissue was performed with post-debridement measurements same as pre-. #3 the wound was cleansed and dry sterile dressing applied. #4 Injection of 0.5 mLs Celestone Soluspan injected into the right ankle joint.  #5 Cultures taken from wound of left third toe. #6 Prescription for Doxycycline 100 mg #20 provided to patient.  #7 Prescription for gentamicin cream provided to patient to be used daily with a Band-Aid. #8 Appointment with Raiford Nobleick for DM shoes. #9 Return to clinic in 3 weeks.    Felecia ShellingBrent M. Leitha Hyppolite, DPM [facility]  Dr. Felecia ShellingBrent M. Hydeia Mcatee, DPM    [address]                                        [place], [place] [address]                Office [phone]  Fax [phone]

## 2017-08-07 ENCOUNTER — Ambulatory Visit: Payer: 59 | Admitting: Podiatry

## 2017-08-07 ENCOUNTER — Encounter: Payer: Self-pay | Admitting: Podiatry

## 2017-08-07 DIAGNOSIS — I70245 Atherosclerosis of native arteries of left leg with ulceration of other part of foot: Secondary | ICD-10-CM

## 2017-08-07 DIAGNOSIS — L97522 Non-pressure chronic ulcer of other part of left foot with fat layer exposed: Secondary | ICD-10-CM

## 2017-08-07 DIAGNOSIS — M659 Synovitis and tenosynovitis, unspecified: Secondary | ICD-10-CM | POA: Diagnosis not present

## 2017-08-07 DIAGNOSIS — E0843 Diabetes mellitus due to underlying condition with diabetic autonomic (poly)neuropathy: Secondary | ICD-10-CM

## 2017-08-08 NOTE — Progress Notes (Signed)
   Subjective:  52 year old male with PMHx of T2DM presenting today for follow up evaluation of a wound to the left third toe as well as right ankle pain. He states the right ankle pain has now resolved. He has been applying gentamicin cream as directed to the wound and has completed the course of Doxycycline. He denies any new complaints at this time. Patient is here for further evaluation and treatment.    Past Medical History:  Diagnosis Date  . Chest pain   . Diabetes mellitus   . Gout   . Hyperlipidemia   . Hypertension   . Obesity       Objective/Physical Exam General: The patient is alert and oriented x3 in no acute distress.  Dermatology:  Wound #1 noted to the left third toe measuring 0.5 x 0.5 x 0.1 cm (LxWxD).   To the noted ulceration(s), there is no eschar. There is a moderate amount of slough, fibrin, and necrotic tissue noted. Granulation tissue and wound base is red. There is a minimal amount of serosanguineous drainage noted. There is no exposed bone muscle-tendon ligament or joint. There is no malodor. Periwound integrity is intact. Skin is warm, dry and supple bilateral lower extremities.  Vascular: Palpable pedal pulses bilaterally. No edema or erythema noted. Capillary refill within normal limits.  Neurological: Epicritic and protective threshold absent bilaterally.   Musculoskeletal Exam: Range of motion within normal limits to all pedal and ankle joints bilateral. Muscle strength 5/5 in all groups bilateral.   Assessment: #1 ulceration to the left third toe secondary to diabetes mellitus - improved #2 diabetes mellitus w/ peripheral neuropathy #3 ankle synovitis/DJD right - resolved #4 localized 3rd left toe cellulitis - resolved   Plan of Care:  #1 Patient was evaluated. Cultures reviewed.  #2 medically necessary excisional debridement including subcutaneous tissue was performed using a tissue nipper and a chisel blade. Excisional debridement of all  the necrotic nonviable tissue down to healthy bleeding viable tissue was performed with post-debridement measurements same as pre-. #3 the wound was cleansed and dry sterile dressing applied. #4 Continue applying gentamicin cream daily with a Band-Aid. #5 Toe crest pads dispensed.  #6 Reschedule appointment with Raiford Nobleick for DM shoes and insoles.  #7 Return to clinic as needed.    Felecia ShellingBrent M. Evans, DPM [facility]  Dr. Felecia ShellingBrent M. Evans, DPM    [address]                                        [place], [place] [address]                Office [phone]  Fax [phone]

## 2017-08-10 DIAGNOSIS — H5213 Myopia, bilateral: Secondary | ICD-10-CM | POA: Diagnosis not present

## 2017-08-10 DIAGNOSIS — E119 Type 2 diabetes mellitus without complications: Secondary | ICD-10-CM | POA: Diagnosis not present

## 2017-08-18 ENCOUNTER — Other Ambulatory Visit: Payer: 59 | Admitting: Orthotics

## 2017-08-28 MED FILL — METFORMIN HCL ER 500 MG TAB: 500 | 90 days supply | Qty: 360 | Fill #0

## 2017-08-28 MED FILL — TRESIBA FLEXTOUCH 200 UNITS: 200 | 60 days supply | Qty: 36 | Fill #0

## 2017-09-05 MED FILL — NOVOFINE 32G NEEDLES: 32G X 6 MM | 90 days supply | Qty: 100 | Fill #0

## 2017-09-05 MED FILL — ATORVASTATIN 80 MG TABLET: 80 | 90 days supply | Qty: 90 | Fill #0

## 2017-09-12 DIAGNOSIS — Z5181 Encounter for therapeutic drug level monitoring: Secondary | ICD-10-CM | POA: Diagnosis not present

## 2017-09-12 DIAGNOSIS — E1165 Type 2 diabetes mellitus with hyperglycemia: Secondary | ICD-10-CM | POA: Diagnosis not present

## 2017-09-12 DIAGNOSIS — Z794 Long term (current) use of insulin: Secondary | ICD-10-CM | POA: Diagnosis not present

## 2017-09-12 DIAGNOSIS — Z7984 Long term (current) use of oral hypoglycemic drugs: Secondary | ICD-10-CM | POA: Diagnosis not present

## 2017-09-12 MED FILL — TRULICITY 1.5 MG/0.5 ML PEN: 1.5 | 84 days supply | Qty: 6 | Fill #0

## 2017-09-12 MED FILL — LISINOPRIL 10 MG TABLET: 10 | 90 days supply | Qty: 90 | Fill #0

## 2017-10-14 ENCOUNTER — Other Ambulatory Visit: Payer: Self-pay

## 2017-10-14 ENCOUNTER — Encounter (HOSPITAL_BASED_OUTPATIENT_CLINIC_OR_DEPARTMENT_OTHER): Payer: Self-pay | Admitting: Adult Health

## 2017-10-14 ENCOUNTER — Emergency Department (HOSPITAL_BASED_OUTPATIENT_CLINIC_OR_DEPARTMENT_OTHER)
Admission: EM | Admit: 2017-10-14 | Discharge: 2017-10-14 | Disposition: A | Discharge status: Home / Self Care | Payer: 59 | Attending: Emergency Medicine | Admitting: Emergency Medicine

## 2017-10-14 DIAGNOSIS — Z7982 Long term (current) use of aspirin: Secondary | ICD-10-CM | POA: Insufficient documentation

## 2017-10-14 DIAGNOSIS — Z794 Long term (current) use of insulin: Secondary | ICD-10-CM | POA: Insufficient documentation

## 2017-10-14 DIAGNOSIS — E119 Type 2 diabetes mellitus without complications: Secondary | ICD-10-CM | POA: Insufficient documentation

## 2017-10-14 DIAGNOSIS — M25562 Pain in left knee: Secondary | ICD-10-CM | POA: Diagnosis not present

## 2017-10-14 DIAGNOSIS — F17220 Nicotine dependence, chewing tobacco, uncomplicated: Secondary | ICD-10-CM | POA: Insufficient documentation

## 2017-10-14 DIAGNOSIS — Z79899 Other long term (current) drug therapy: Secondary | ICD-10-CM | POA: Diagnosis not present

## 2017-10-14 DIAGNOSIS — I10 Essential (primary) hypertension: Secondary | ICD-10-CM | POA: Insufficient documentation

## 2017-10-14 MED ORDER — TRAMADOL HCL 50 MG PO TABS: 50.0000 mg | ORAL_TABLET | Freq: Four times a day (QID) | ORAL | 0 refills | Status: DC | PRN

## 2017-10-14 MED ORDER — HYDROCODONE-ACETAMINOPHEN 5-325 MG PO TABS
1.0000 | ORAL_TABLET | Freq: Once | ORAL | Status: AC
  Administered 2017-10-14: 1 via ORAL
  Filled 2017-10-14: qty 1

## 2017-10-14 MED ORDER — INDOMETHACIN 50 MG PO CAPS: 50.0000 mg | ORAL_CAPSULE | Freq: Three times a day (TID) | ORAL | 0 refills | Status: DC

## 2017-10-14 MED ORDER — KETOROLAC TROMETHAMINE 30 MG/ML IJ SOLN
30.0000 mg | Freq: Once | INTRAMUSCULAR | Status: AC
  Administered 2017-10-14: 30 mg via INTRAMUSCULAR
  Filled 2017-10-14: qty 1

## 2017-10-14 NOTE — Discharge Instructions (Signed)
It was my pleasure taking care of you today!   Take Indomethacin three times daily. This is best taken with food to prevent upset stomach. This will help with pain.  Tramadol as needed for severe pain only as needed - This can make you very drowsy - please do not drink alcohol, operate heavy machinery or drive on this medication.  You can also alternate with Tylenol if needed.   Please call your orthopedic doctor on Monday to schedule a follow up appointment.   Return to ER for fever, new or worsening symptoms, any additional concerns.

## 2017-10-14 NOTE — ED Provider Notes (Signed)
[facility] EMERGENCY DEPARTMENT Provider Note   CSN: [id number] Arrival date & time: 10/14/17  1046     History   Chief Complaint Chief Complaint  Patient presents with  . Knee Pain    HPI Martin [name] is a 52 y.o. male.  The history is provided by the patient and medical records. No language interpreter was used.  Knee Pain   Pertinent negatives include no numbness.   Martin [name] is a 52 y.o. male  with a PMH of gout, DM, HTN, HLD who presents to the Emergency Department complaining of acute onset of left knee pain which began yesterday.  Associated with swelling.  Pain is worse with ambulation and certain movements.  He has a history of gout and endorses pain today feels similar to his typical gout flares.  He has tried ibuprofen with no improvement.  He denies any fever or chills.  He denies any known injury or trauma.  He does work on his feet, but does not feel like this has contributed.   Past Medical History:  Diagnosis Date  . Chest pain   . Diabetes mellitus   . Gout   . Hyperlipidemia   . Hypertension   . Obesity     Patient Active Problem List   Diagnosis Date Noted  . Type 2 diabetes mellitus (HCC) 10/05/2015  . Chest pain 12/19/2014  . Hypertriglyceridemia 08/20/2012  . Gout 07/25/2012  . Essential hypertension, benign 07/25/2012  . Other and unspecified hyperlipidemia 07/25/2012  . Glaucoma 07/25/2012  . Type II or unspecified type diabetes mellitus without mention of complication, uncontrolled 11/30/2010    Past Surgical History:  Procedure Laterality Date  . UMBILICAL HERNIA REPAIR          Home Medications    Prior to Admission medications   Medication Sig Start Date End Date Taking? Authorizing Provider  aspirin 81 MG tablet Take 81 mg by mouth daily.      [provider]  atorvastatin (LIPITOR) 80 MG tablet Take 80 mg by mouth daily.    [provider]  clindamycin (CLEOCIN) 300 MG capsule Take 1  capsule (300 mg total) by mouth 3 (three) times daily. X 7 days 06/18/17   Charlynne Pander, MD  Diclofenac Sodium CR (VOLTAREN-XR) 100 MG 24 hr tablet Take 1 tablet (100 mg total) by mouth daily. 06/20/17   Palumbo, April, MD  doxycycline (VIBRA-TABS) 100 MG tablet Take 1 tablet (100 mg total) by mouth 2 (two) times daily. 07/19/17   Felecia Shelling, DPM  Dulaglutide (TRULICITY) 1.5 MG/0.5ML SOPN Inject 1.5 mg into the skin once a week.    [provider]  fenofibrate 160 MG tablet Take 160 mg by mouth daily.    [provider]  gabapentin (NEURONTIN) 300 MG capsule Take 1 capsule (300 mg total) by mouth 3 (three) times daily. Patient taking differently: Take 300 mg by mouth 3 (three) times daily as needed.  10/05/13   Nelva Nay, MD  gentamicin cream (GARAMYCIN) 0.1 % Apply 1 application topically 3 (three) times daily. 07/19/17   Felecia Shelling, DPM  indomethacin (INDOCIN) 50 MG capsule Take 1 capsule (50 mg total) by mouth 3 (three) times daily with meals. 10/14/17   Jahmari Esbenshade, Chase Picket, PA-C  Insulin Degludec (TRESIBA FLEXTOUCH) 200 UNIT/ML SOPN Inject 80 Units into the skin daily.    [provider]  insulin glargine (LANTUS) 100 UNIT/ML injection Inject 100 Units into the skin at bedtime.  08/17/12   Carlus Pavlov, MD  lisinopril (PRINIVIL,ZESTRIL) 10 MG tablet Take 10 mg by mouth daily.    [provider]  METFORMIN HCL PO Take 2,000 mg by mouth daily.     [provider]  naproxen sodium (ANAPROX) 220 MG tablet Take 220 mg by mouth 3 (three) times daily with meals.    [provider]  omega-3 acid ethyl esters (LOVAZA) 1 G capsule TAKE 1 CAPSULE BY MOUTH 2 TIMES DAILY WITH MEALS 02/06/14   Carlus Pavlov, MD  [facility] VERIO test strip USE AS INSTRUCTED 2-3 X A DAY. 08/07/13   Carlus Pavlov, MD  traMADol (ULTRAM) 50 MG tablet Take 1 tablet (50 mg total) by mouth every 6 (six) hours as needed. 10/14/17   Cambreigh Dearing, Chase Picket, PA-C     Family History Family History  Problem Relation Age of Onset  . Diabetes Maternal Grandfather   . Kidney disease Other     Social History Social History   Tobacco Use  . Smoking status: Never Smoker  . Smokeless tobacco: Current User    Types: Chew  Substance Use Topics  . Alcohol use: Yes    Alcohol/week: 0.0 oz  . Drug use: No     Allergies   Patient has no known allergies.   Review of Systems Review of Systems  Constitutional: Negative for chills and fever.  Genitourinary: Negative for difficulty urinating.  Musculoskeletal: Positive for arthralgias and joint swelling. Negative for back pain.  Skin: Negative for color change.  Neurological: Negative for weakness and numbness.     Physical Exam Updated Vital Signs BP (!) 164/78 (BP Location: Left Arm)   Pulse 64   Temp 98.6 F (37 C) (Oral)   Resp 18   Ht  (1.803 m)   Wt 114.3 kg (252 lb)   SpO2 98%   BMI 35.15 kg/m   Physical Exam  Constitutional: He appears well-developed and well-nourished. No distress.  HENT:  Head: Normocephalic and atraumatic.  Neck: Neck supple.  Cardiovascular: Normal rate, regular rhythm and normal heart sounds.  No murmur heard. Pulmonary/Chest: Effort normal and breath sounds normal. No respiratory distress. He has no wheezes. He has no rales.  Musculoskeletal:  Left knee with diffuse tenderness and mild swelling. He does have warmth to the knee, however no erythema appreciated. Decreased ROM. 2+ DP pulse. Sensation intact. Full ROM without pain to ankle and hip.   Neurological: He is alert.  Skin: Skin is warm and dry.  Nursing note and vitals reviewed.    ED Treatments / Results  Labs (all labs ordered are listed, but only abnormal results are displayed) Labs Reviewed - No data to display  EKG None  Radiology No results found.  Procedures Procedures (including critical care time)  Medications Ordered in ED Medications  HYDROcodone-acetaminophen  (NORCO/VICODIN) 5-325 MG per tablet 1 tablet (1 tablet Oral Given 10/14/17 1204)  ketorolac (TORADOL) 30 MG/ML injection 30 mg (30 mg Intramuscular Given 10/14/17 1205)     Initial Impression / Assessment and Plan / ED Course  I have reviewed the triage vital signs and the nursing notes.  Pertinent labs & imaging results that were available during my care of the patient were reviewed by me and considered in my medical decision making (see chart for details).    Martin [name] is a 52 y.o. male who presents to ED for acute onset of left knee pain which he feels is similar to his typical gout flares. On  exam, patient is afebrile, hemodynamically stable with diffuse tenderness to the left knee associated with swelling.  It is mildly warm to the touch, however there is no erythema or overlying skin changes.  There are no open wounds.  Exam not consistent with septic joint.  He had no injury or trauma to suggest fracture.  He is neurovascularly intact.  Will treat for gout flare, however did speak at length about return precautions and follow-up care.  All questions answered.    Final Clinical Impressions(s) / ED Diagnoses   Final diagnoses:  Acute pain of left knee    ED Discharge Orders        Ordered    indomethacin (INDOCIN) 50 MG capsule  3 times daily with meals     10/14/17 1219    traMADol (ULTRAM) 50 MG tablet  Every 6 hours PRN     10/14/17 1219       Yitzchak Kothari, Chase Picket, PA-C 10/14/17 1352    Gwyneth Sprout, MD 10/15/17 [phone]

## 2017-10-14 NOTE — ED Triage Notes (Signed)
PResents with left knee pain that began yesterday associated with swelling and difficulty ambulating. Ibuprofen is not helping.

## 2017-10-15 ENCOUNTER — Other Ambulatory Visit: Payer: Self-pay

## 2017-10-15 ENCOUNTER — Encounter (HOSPITAL_BASED_OUTPATIENT_CLINIC_OR_DEPARTMENT_OTHER): Payer: Self-pay | Admitting: Emergency Medicine

## 2017-10-15 ENCOUNTER — Emergency Department (HOSPITAL_BASED_OUTPATIENT_CLINIC_OR_DEPARTMENT_OTHER): Payer: 59

## 2017-10-15 ENCOUNTER — Emergency Department (HOSPITAL_BASED_OUTPATIENT_CLINIC_OR_DEPARTMENT_OTHER)
Admission: EM | Admit: 2017-10-15 | Discharge: 2017-10-15 | Disposition: A | Discharge status: Home / Self Care | Payer: 59 | Attending: Emergency Medicine | Admitting: Emergency Medicine

## 2017-10-15 DIAGNOSIS — F458 Other somatoform disorders: Secondary | ICD-10-CM | POA: Diagnosis not present

## 2017-10-15 DIAGNOSIS — Z79899 Other long term (current) drug therapy: Secondary | ICD-10-CM | POA: Insufficient documentation

## 2017-10-15 DIAGNOSIS — E785 Hyperlipidemia, unspecified: Secondary | ICD-10-CM | POA: Diagnosis not present

## 2017-10-15 DIAGNOSIS — Z7982 Long term (current) use of aspirin: Secondary | ICD-10-CM | POA: Insufficient documentation

## 2017-10-15 DIAGNOSIS — Z794 Long term (current) use of insulin: Secondary | ICD-10-CM | POA: Insufficient documentation

## 2017-10-15 DIAGNOSIS — R0602 Shortness of breath: Secondary | ICD-10-CM | POA: Diagnosis not present

## 2017-10-15 DIAGNOSIS — I10 Essential (primary) hypertension: Secondary | ICD-10-CM | POA: Diagnosis not present

## 2017-10-15 DIAGNOSIS — R2242 Localized swelling, mass and lump, left lower limb: Secondary | ICD-10-CM | POA: Insufficient documentation

## 2017-10-15 DIAGNOSIS — M25562 Pain in left knee: Secondary | ICD-10-CM | POA: Insufficient documentation

## 2017-10-15 DIAGNOSIS — E119 Type 2 diabetes mellitus without complications: Secondary | ICD-10-CM | POA: Insufficient documentation

## 2017-10-15 DIAGNOSIS — R0989 Other specified symptoms and signs involving the circulatory and respiratory systems: Secondary | ICD-10-CM

## 2017-10-15 DIAGNOSIS — M7989 Other specified soft tissue disorders: Secondary | ICD-10-CM | POA: Diagnosis not present

## 2017-10-15 LAB — CBG MONITORING, ED: Glucose-Capillary: 182 mg/dL — ABNORMAL HIGH (ref 65–99)

## 2017-10-15 MED ORDER — OXYCODONE-ACETAMINOPHEN 5-325 MG PO TABS
1.0000 | ORAL_TABLET | Freq: Once | ORAL | Status: AC
  Administered 2017-10-15: 1 via ORAL
  Filled 2017-10-15: qty 1

## 2017-10-15 MED ORDER — OXYCODONE-ACETAMINOPHEN 5-325 MG PO TABS: 1.0000 | ORAL_TABLET | ORAL | 0 refills | Status: DC | PRN

## 2017-10-15 MED ORDER — DIPHENHYDRAMINE HCL 25 MG PO CAPS
25.0000 mg | ORAL_CAPSULE | Freq: Once | ORAL | Status: AC
  Administered 2017-10-15: 25 mg via ORAL
  Filled 2017-10-15: qty 1

## 2017-10-15 MED ORDER — LIDOCAINE HCL (PF) 1 % IJ SOLN
10.0000 mL | Freq: Once | INTRAMUSCULAR | Status: AC
  Administered 2017-10-15: 10 mL
  Filled 2017-10-15: qty 10

## 2017-10-15 NOTE — ED Notes (Signed)
Patient is called to go to a room and he is down by the vending machines getting coffee Patient in no noted distress

## 2017-10-15 NOTE — ED Notes (Signed)
States," I feel like something is stuck in my throat" eating and drinking per norm.

## 2017-10-15 NOTE — ED Notes (Signed)
Patient refused the crutches. 

## 2017-10-15 NOTE — ED Notes (Signed)
Patient transported to Ultrasound 

## 2017-10-15 NOTE — ED Provider Notes (Signed)
[facility] EMERGENCY DEPARTMENT Provider Note   CSN: [id number] Arrival date & time: 10/15/17  1727     History   Chief Complaint Chief Complaint  Patient presents with  . Shortness of Breath  . Knee Pain    HPI Martin [name] is a 52 y.o. male.  Who presents the emergency department chief complaint of left knee pain and swelling.  He was seen yesterday and treated for gout.  He has a long-standing history of gout.  Patient states that today his wife noticed bruising on the back of his knee.  He is complaining of swelling distal to the swelling of his knee.  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  He is never had his knee swelled like this before.  He denies any injuries.  He has had gout in the past however normally treatment helps it resolve.  Morning the patient also had an episode of coughing and choking.  He felt like he was having difficulty breathing.  His wife states that he was able to talk, phonate normally and swallow water.  She states that he heard him coughing like he was trying to clear his throat.  Patient has a history of previous choking incident that required the Heimlich maneuver.  She states that she thinks he may have gotten very anxious because of his sensation this morning.  Patient states that he feels like there is something caught low down in his throat.  He notices it when he is swallowing only on the left side.  He denies change in voice, difficulty breathing.  HPI  Past Medical History:  Diagnosis Date  . Chest pain   . Diabetes mellitus   . Gout   . Hyperlipidemia   . Hypertension   . Obesity     Patient Active Problem List   Diagnosis Date Noted  . Type 2 diabetes mellitus (HCC) 10/05/2015  . Chest pain 12/19/2014  . Hypertriglyceridemia 08/20/2012  . Gout 07/25/2012  . Essential hypertension, benign 07/25/2012  . Other and unspecified hyperlipidemia 07/25/2012  . Glaucoma  07/25/2012  . Type II or unspecified type diabetes mellitus without mention of complication, uncontrolled 11/30/2010    Past Surgical History:  Procedure Laterality Date  . UMBILICAL HERNIA REPAIR          Home Medications    Prior to Admission medications   Medication Sig Start Date End Date Taking? Authorizing Provider  aspirin 81 MG tablet Take 81 mg by mouth daily.      [provider]  atorvastatin (LIPITOR) 80 MG tablet Take 80 mg by mouth daily.    [provider]  clindamycin (CLEOCIN) 300 MG capsule Take 1 capsule (300 mg total) by mouth 3 (three) times daily. X 7 days 06/18/17   Charlynne Pander, MD  Diclofenac Sodium CR (VOLTAREN-XR) 100 MG 24 hr tablet Take 1 tablet (100 mg total) by mouth daily. 06/20/17   Palumbo, April, MD  doxycycline (VIBRA-TABS) 100 MG tablet Take 1 tablet (100 mg total) by mouth 2 (two) times daily. 07/19/17   Felecia Shelling, DPM  Dulaglutide (TRULICITY) 1.5 MG/0.5ML SOPN Inject 1.5 mg into the skin once a week.    [provider]  fenofibrate 160 MG tablet Take 160 mg by mouth daily.    [provider]  gabapentin (NEURONTIN) 300 MG capsule Take 1 capsule (300 mg total) by mouth 3 (three) times daily. Patient taking  differently: Take 300 mg by mouth 3 (three) times daily as needed.  10/05/13   Nelva Nay, MD  gentamicin cream (GARAMYCIN) 0.1 % Apply 1 application topically 3 (three) times daily. 07/19/17   Felecia Shelling, DPM  indomethacin (INDOCIN) 50 MG capsule Take 1 capsule (50 mg total) by mouth 3 (three) times daily with meals. 10/14/17   Ward, Chase Picket, PA-C  Insulin Degludec (TRESIBA FLEXTOUCH) 200 UNIT/ML SOPN Inject 80 Units into the skin daily.    [provider]  insulin glargine (LANTUS) 100 UNIT/ML injection Inject 100 Units into the skin at bedtime.  08/17/12   Carlus Pavlov, MD  lisinopril (PRINIVIL,ZESTRIL) 10 MG tablet Take 10 mg by mouth daily.    [provider]    METFORMIN HCL PO Take 2,000 mg by mouth daily.     [provider]  naproxen sodium (ANAPROX) 220 MG tablet Take 220 mg by mouth 3 (three) times daily with meals.    [provider]  omega-3 acid ethyl esters (LOVAZA) 1 G capsule TAKE 1 CAPSULE BY MOUTH 2 TIMES DAILY WITH MEALS 02/06/14   Carlus Pavlov, MD  [facility] VERIO test strip USE AS INSTRUCTED 2-3 X A DAY. 08/07/13   Carlus Pavlov, MD  traMADol (ULTRAM) 50 MG tablet Take 1 tablet (50 mg total) by mouth every 6 (six) hours as needed. 10/14/17   Ward, Chase Picket, PA-C    Family History Family History  Problem Relation Age of Onset  . Diabetes Maternal Grandfather   . Kidney disease Other     Social History Social History   Tobacco Use  . Smoking status: Never Smoker  . Smokeless tobacco: Current User    Types: Chew  Substance Use Topics  . Alcohol use: Yes    Alcohol/week: 0.0 oz  . Drug use: No     Allergies   Patient has no known allergies.   Review of Systems Review of Systems  Ten systems reviewed and are negative for acute change, except as noted in the HPI.   Physical Exam Updated Vital Signs BP (!) 154/77 (BP Location: Left Arm)   Pulse 62   Temp 98.3 F (36.8 C) (Oral)   Resp 18   Ht  (1.803 m)   Wt 114.3 kg (252 lb)   SpO2 98%   BMI 35.15 kg/m   Physical Exam  Constitutional: He appears well-developed and well-nourished. No distress.  HENT:  Head: Normocephalic and atraumatic.  Eyes: Conjunctivae are normal. No scleral icterus.  Neck: Normal range of motion. Neck supple.  Cardiovascular: Normal rate, regular rhythm and normal heart sounds.  Pulmonary/Chest: Effort normal and breath sounds normal. No respiratory distress. He exhibits edema.  Abdominal: Soft. There is no tenderness.  Musculoskeletal:       Left lower leg: He exhibits edema.  Range of motion of the leg without significant pain.  No crepitus, left knee with stable ligaments.  Her appears to be some  effusion of the left knee. Bruising noted at the inferior bifurcation of the gastrocnemius muscle.  No significant tenderness.  Edema noted into the ankle.  Neurological: He is alert.  Skin: Skin is warm and dry. He is not diaphoretic.  Psychiatric: His behavior is normal.  Nursing note and vitals reviewed.    ED Treatments / Results  Labs (all labs ordered are listed, but only abnormal results are displayed) Labs Reviewed - No data to display  EKG None ED ECG REPORT   Date: 10/15/2017  Rate: 61  Rhythm: normal sinus rhythm  QRS Axis: normal  Intervals: normal  ST/T Wave abnormalities: indeterminate  Conduction Disutrbances:none  Narrative Interpretation:  Abnormal ECG  I have personally reviewed the EKG tracing and agree with the computerized printout as noted.  Radiology No results found.  Procedures .Joint Aspiration/Arthrocentesis Date/Time: 10/15/2017 11:51 PM Performed by: Arthor Captain, PA-C Authorized by: Arthor Captain, PA-C   Consent:    Consent obtained:  Verbal   Consent given by:  Patient   Risks discussed:  Bleeding, infection and pain Location:    Location:  Knee   Knee:  L knee Anesthesia (see MAR for exact dosages):    Anesthesia method:  Local infiltration   Local anesthetic:  Lidocaine 1% w/o epi Procedure details:    Preparation: Patient was prepped and draped in usual sterile fashion     Needle gauge:  18 G   Ultrasound guidance: no     Approach:  Superior   Aspirate amount:  10   Aspirate characteristics:  Blood-tinged   Steroid injected: no     Specimen collected: yes   Post-procedure details:    Dressing:  Adhesive bandage   Patient tolerance of procedure:  Tolerated well, no immediate complications   (including critical care time)  Medications Ordered in ED Medications - No data to display   Initial Impression / Assessment and Plan / ED Course  I have reviewed the triage vital signs and the nursing notes.  Pertinent  labs & imaging results that were available during my care of the patient were reviewed by me and considered in my medical decision making (see chart for details).     Patient imaging negative for acute abnormality, no DVT. Patient synovial fluid sent for evaluation however this is a send out lab from [facility].  Patient does not wish to stay for further evaluation.  Be discharged with knee sleeve and pain medications.  He should follow-up closely with his orthopedist.  Final Clinical Impressions(s) / ED Diagnoses   Final diagnoses:  Acute pain of left knee  Globus sensation    ED Discharge Orders    None       Arthor Captain, PA-C 10/15/17 2357    Benjiman Core, MD 10/16/17 0001

## 2017-10-15 NOTE — Discharge Instructions (Signed)
° ° ° ° ° ° ° °  What happens during the procedure? You will sit or lie down in a position for your knee to be treated. Your knee will be cleaned with a germ-killing solution (antiseptic). You will be given a medicine that numbs the area (local anesthetic). You may feel some stinging. After your knee becomes numb, a health care provider will insert a long, thin needle into the side of your knee joint. You may feel some pressure. Your health care provider will pull back the syringe on the needle to remove fluid. Your health care provider may press on your knee to help remove the fluid. At the end of the procedure, the needle will be removed. A bandage (dressing) will be placed over the puncture site.  Contact a health care provider if: Your symptoms get worse. You have throat pain. You have trouble swallowing. Food or liquid comes back up into your mouth. You lose weight without trying. Get help right away if: You develop swelling in your throat

## 2017-10-15 NOTE — ED Triage Notes (Signed)
PResents with left knee pain that began 2 days ago associated with swelling and difficulty ambulating. THe patient states that his left knee is now swollen  - patient states that he had some SOB this am

## 2017-10-16 DIAGNOSIS — Z79899 Other long term (current) drug therapy: Secondary | ICD-10-CM | POA: Diagnosis not present

## 2017-10-16 DIAGNOSIS — M25562 Pain in left knee: Secondary | ICD-10-CM | POA: Diagnosis not present

## 2017-10-16 DIAGNOSIS — E781 Pure hyperglyceridemia: Secondary | ICD-10-CM | POA: Diagnosis not present

## 2017-10-16 DIAGNOSIS — E1165 Type 2 diabetes mellitus with hyperglycemia: Secondary | ICD-10-CM | POA: Diagnosis not present

## 2017-10-16 LAB — SYNOVIAL CELL COUNT + DIFF, W/ CRYSTALS
CRYSTALS FLUID: NONE SEEN
Eosinophils-Synovial: 0 % (ref 0–1)
LYMPHOCYTES-SYNOVIAL FLD: 46 % — AB (ref 0–20)
Monocyte-Macrophage-Synovial Fluid: 46 % — ABNORMAL LOW (ref 50–90)
NEUTROPHIL, SYNOVIAL: 8 % (ref 0–25)
WBC, SYNOVIAL: 94 /mm3 (ref 0–200)

## 2017-10-16 MED FILL — OXYCODONE-ACETAMINOPHEN 5-3: 5-325 | 1 days supply | Qty: 10 | Fill #0

## 2017-10-18 MED FILL — TRESIBA FLEXTOUCH 200 UNITS: 200 | 90 days supply | Qty: 54 | Fill #0

## 2017-10-19 LAB — BODY FLUID CULTURE: Culture: NO GROWTH

## 2017-10-23 ENCOUNTER — Ambulatory Visit: Payer: 59 | Admitting: Physical Therapy

## 2017-10-25 ENCOUNTER — Ambulatory Visit: Payer: 59 | Admitting: Physical Therapy

## 2017-11-01 DIAGNOSIS — L03116 Cellulitis of left lower limb: Secondary | ICD-10-CM | POA: Diagnosis not present

## 2017-11-01 DIAGNOSIS — R6883 Chills (without fever): Secondary | ICD-10-CM | POA: Diagnosis not present

## 2017-11-01 DIAGNOSIS — R509 Fever, unspecified: Secondary | ICD-10-CM | POA: Diagnosis not present

## 2017-11-01 DIAGNOSIS — R112 Nausea with vomiting, unspecified: Secondary | ICD-10-CM | POA: Diagnosis not present

## 2017-11-02 ENCOUNTER — Other Ambulatory Visit: Payer: Self-pay

## 2017-11-02 ENCOUNTER — Encounter (HOSPITAL_BASED_OUTPATIENT_CLINIC_OR_DEPARTMENT_OTHER): Payer: Self-pay

## 2017-11-02 ENCOUNTER — Emergency Department (HOSPITAL_BASED_OUTPATIENT_CLINIC_OR_DEPARTMENT_OTHER)
Admission: EM | Admit: 2017-11-02 | Discharge: 2017-11-02 | Disposition: A | Discharge status: Home / Self Care | Payer: 59 | Attending: Emergency Medicine | Admitting: Emergency Medicine

## 2017-11-02 DIAGNOSIS — I10 Essential (primary) hypertension: Secondary | ICD-10-CM | POA: Diagnosis not present

## 2017-11-02 DIAGNOSIS — Z7982 Long term (current) use of aspirin: Secondary | ICD-10-CM | POA: Diagnosis not present

## 2017-11-02 DIAGNOSIS — R638 Other symptoms and signs concerning food and fluid intake: Secondary | ICD-10-CM | POA: Diagnosis present

## 2017-11-02 DIAGNOSIS — Z794 Long term (current) use of insulin: Secondary | ICD-10-CM | POA: Insufficient documentation

## 2017-11-02 DIAGNOSIS — Z79899 Other long term (current) drug therapy: Secondary | ICD-10-CM | POA: Diagnosis not present

## 2017-11-02 DIAGNOSIS — E119 Type 2 diabetes mellitus without complications: Secondary | ICD-10-CM | POA: Diagnosis not present

## 2017-11-02 DIAGNOSIS — L03116 Cellulitis of left lower limb: Secondary | ICD-10-CM | POA: Diagnosis not present

## 2017-11-02 DIAGNOSIS — R110 Nausea: Secondary | ICD-10-CM | POA: Diagnosis not present

## 2017-11-02 LAB — BASIC METABOLIC PANEL
Anion gap: 9 (ref 5–15)
BUN: 16 mg/dL (ref 6–20)
CHLORIDE: 96 mmol/L — AB (ref 101–111)
CO2: 24 mmol/L (ref 22–32)
CREATININE: 0.99 mg/dL (ref 0.61–1.24)
Calcium: 8.2 mg/dL — ABNORMAL LOW (ref 8.9–10.3)
GFR calc Af Amer: >60 mL/min (ref >60)
GFR calc non Af Amer: >60 mL/min (ref >60)
Glucose, Bld: 157 mg/dL — ABNORMAL HIGH (ref 65–99)
Potassium: 5.2 mmol/L — ABNORMAL HIGH (ref 3.5–5.1)
Sodium: 129 mmol/L — ABNORMAL LOW (ref 135–145)

## 2017-11-02 LAB — CBC WITH DIFFERENTIAL/PLATELET
Basophils Absolute: 0 10*3/uL (ref 0.0–0.1)
Basophils Relative: 0 %
Eosinophils Absolute: 0.2 10*3/uL (ref 0.0–0.7)
Eosinophils Relative: 1 %
HEMATOCRIT: 42.1 % (ref 39.0–52.0)
Hemoglobin: 14.9 g/dL (ref 13.0–17.0)
LYMPHS ABS: 1.3 10*3/uL (ref 0.7–4.0)
Lymphocytes Relative: 9 %
MCH: 29.7 pg (ref 26.0–34.0)
MCHC: 35.4 g/dL (ref 30.0–36.0)
MCV: 83.9 fL (ref 78.0–100.0)
MONOS PCT: 9 %
Monocytes Absolute: 1.2 10*3/uL — ABNORMAL HIGH (ref 0.1–1.0)
NEUTROS ABS: 11.4 10*3/uL — AB (ref 1.7–7.7)
Neutrophils Relative %: 81 %
Platelets: 174 10*3/uL (ref 150–400)
RBC: 5.02 MIL/uL (ref 4.22–5.81)
RDW: 13.5 % (ref 11.5–15.5)
WBC: 14.1 10*3/uL — ABNORMAL HIGH (ref 4.0–10.5)

## 2017-11-02 LAB — I-STAT CG4 LACTIC ACID, ED
Lactic Acid, Venous: 2.21 mmol/L (ref 0.5–1.9)
Lactic Acid, Venous: 1.2 mmol/L (ref 0.5–1.9)

## 2017-11-02 MED ORDER — IBUPROFEN 400 MG PO TABS
600.0000 mg | ORAL_TABLET | Freq: Once | ORAL | Status: AC
  Administered 2017-11-02: 600 mg via ORAL
  Filled 2017-11-02: qty 1

## 2017-11-02 MED ORDER — SODIUM CHLORIDE 0.9 % IV BOLUS
1000.0000 mL | Freq: Once | INTRAVENOUS | Status: AC
  Administered 2017-11-02: 1000 mL via INTRAVENOUS

## 2017-11-02 MED ORDER — IBUPROFEN 600 MG PO TABS: 600.0000 mg | ORAL_TABLET | Freq: Four times a day (QID) | ORAL | 0 refills | Status: DC | PRN

## 2017-11-02 MED ORDER — DOXYCYCLINE HYCLATE 100 MG PO CAPS: 100.0000 mg | ORAL_CAPSULE | Freq: Two times a day (BID) | ORAL | 0 refills | Status: AC

## 2017-11-02 MED ORDER — ACETAMINOPHEN 325 MG PO TABS: 650.0000 mg | ORAL_TABLET | Freq: Four times a day (QID) | ORAL | 0 refills | Status: DC | PRN

## 2017-11-02 MED ORDER — VANCOMYCIN HCL IN DEXTROSE 1-5 GM/200ML-% IV SOLN
1000.0000 mg | Freq: Once | INTRAVENOUS | Status: AC
  Administered 2017-11-02: 1000 mg via INTRAVENOUS
  Filled 2017-11-02: qty 200

## 2017-11-02 NOTE — ED Provider Notes (Signed)
[facility] EMERGENCY DEPARTMENT Provider Note   CSN: [id number] Arrival date & time: 11/02/17  1813     History   Chief Complaint Chief Complaint  Patient presents with  . Abnormal Lab    HPI Martin [name] is a 52 y.o. male with past medical history significant for diabetes, hyperlipidemia, hypertension and recurrent cellulitis presenting with left lower extremity cellulitis.  She states that this started approximately 2 days ago and progressed rapidly.  He was seen by his PCP yesterday and sent home with Keflex he has taken 3 doses thus far and reports that he feels like he is improving does not have a fever today.  His last ibuprofen and Tylenol was 5 hours ago.   Yesterday he had been nauseated especially when he drinks water but has been able to keep down Gatorade and other fluids.  He was sent home with zofran. Patient has also had a decreased appetite.  Per patient's wife, he has had a decrease in urine and poor appetite, but has been drinking gatorade and staying hydrated.   HPI  Past Medical History:  Diagnosis Date  . Chest pain   . Diabetes mellitus   . Gout   . Hyperlipidemia   . Hypertension   . Obesity     Patient Active Problem List   Diagnosis Date Noted  . Type 2 diabetes mellitus (HCC) 10/05/2015  . Chest pain 12/19/2014  . Hypertriglyceridemia 08/20/2012  . Gout 07/25/2012  . Essential hypertension, benign 07/25/2012  . Other and unspecified hyperlipidemia 07/25/2012  . Glaucoma 07/25/2012  . Type II or unspecified type diabetes mellitus without mention of complication, uncontrolled 11/30/2010    Past Surgical History:  Procedure Laterality Date  . UMBILICAL HERNIA REPAIR          Home Medications    Prior to Admission medications   Medication Sig Start Date End Date Taking? Authorizing Provider  acetaminophen (TYLENOL) 325 MG tablet Take 2 tablets (650 mg total) by mouth every 6 (six) hours as needed for mild pain or moderate  pain. 11/02/17   Georgiana Shore, PA-C  aspirin 81 MG tablet Take 81 mg by mouth daily.      [provider]  atorvastatin (LIPITOR) 80 MG tablet Take 80 mg by mouth daily.    [provider]  clindamycin (CLEOCIN) 300 MG capsule Take 1 capsule (300 mg total) by mouth 3 (three) times daily. X 7 days 06/18/17   Charlynne Pander, MD  Diclofenac Sodium CR (VOLTAREN-XR) 100 MG 24 hr tablet Take 1 tablet (100 mg total) by mouth daily. 06/20/17   Palumbo, April, MD  doxycycline (VIBRAMYCIN) 100 MG capsule Take 1 capsule (100 mg total) by mouth 2 (two) times daily for 7 days. 11/02/17 11/09/17  Mathews Robinsons B, PA-C  Dulaglutide (TRULICITY) 1.5 MG/0.5ML SOPN Inject 1.5 mg into the skin once a week.    [provider]  fenofibrate 160 MG tablet Take 160 mg by mouth daily.    [provider]  gabapentin (NEURONTIN) 300 MG capsule Take 1 capsule (300 mg total) by mouth 3 (three) times daily. Patient taking differently: Take 300 mg by mouth 3 (three) times daily as needed.  10/05/13   Nelva Nay, MD  gentamicin cream (GARAMYCIN) 0.1 % Apply 1 application topically 3 (three) times daily. 07/19/17   Felecia Shelling, DPM  ibuprofen (ADVIL,MOTRIN) 600 MG tablet Take 1 tablet (600 mg total) by mouth every 6 (six) hours as needed. 11/02/17  Mathews Robinsons B, PA-C  indomethacin (INDOCIN) 50 MG capsule Take 1 capsule (50 mg total) by mouth 3 (three) times daily with meals. 10/14/17   Ward, Chase Picket, PA-C  Insulin Degludec (TRESIBA FLEXTOUCH) 200 UNIT/ML SOPN Inject 80 Units into the skin daily.    [provider]  insulin glargine (LANTUS) 100 UNIT/ML injection Inject 100 Units into the skin at bedtime.  08/17/12   Carlus Pavlov, MD  lisinopril (PRINIVIL,ZESTRIL) 10 MG tablet Take 10 mg by mouth daily.    [provider]  METFORMIN HCL PO Take 2,000 mg by mouth daily.     [provider]  naproxen sodium (ANAPROX) 220 MG tablet Take 220 mg by  mouth 3 (three) times daily with meals.    [provider]  omega-3 acid ethyl esters (LOVAZA) 1 G capsule TAKE 1 CAPSULE BY MOUTH 2 TIMES DAILY WITH MEALS 02/06/14   Carlus Pavlov, MD  [facility] VERIO test strip USE AS INSTRUCTED 2-3 X A DAY. 08/07/13   Carlus Pavlov, MD  oxyCODONE-acetaminophen (PERCOCET) 5-325 MG tablet Take 1-2 tablets by mouth every 4 (four) hours as needed. 10/15/17   Harris, Cammy Copa, PA-C  traMADol (ULTRAM) 50 MG tablet Take 1 tablet (50 mg total) by mouth every 6 (six) hours as needed. 10/14/17   Ward, Chase Picket, PA-C    Family History Family History  Problem Relation Age of Onset  . Diabetes Maternal Grandfather   . Kidney disease Other     Social History Social History   Tobacco Use  . Smoking status: Never Smoker  . Smokeless tobacco: Current User    Types: Chew  Substance Use Topics  . Alcohol use: Yes    Alcohol/week: 0.0 oz  . Drug use: No     Allergies   Patient has no known allergies.   Review of Systems Review of Systems  Constitutional: Positive for appetite change. Negative for chills, diaphoresis, fatigue and fever.  HENT: Sore throat:     Respiratory: Negative for cough, choking, chest tightness, shortness of breath, wheezing and stridor.   Cardiovascular: Positive for leg swelling. Negative for chest pain and palpitations.  Gastrointestinal: Positive for nausea. Negative for abdominal pain and vomiting.  Genitourinary: Positive for decreased urine volume.  Musculoskeletal: Positive for myalgias. Negative for arthralgias, joint swelling, neck pain and neck stiffness.  Skin: Positive for color change and rash.  Neurological: Negative for dizziness, weakness, light-headedness, numbness and headaches.     Physical Exam Updated Vital Signs BP (!) 150/83 (BP Location: Right Arm)   Pulse 96   Temp 98.4 F (36.9 C) (Oral)   Resp 20   Ht  (1.803 m)   Wt 119.7 kg (264 lb)   SpO2 96%   BMI 36.82 kg/m    Physical Exam  Constitutional: He appears well-developed and well-nourished. No distress.  Patient is afebrile with last antipyretic 5 hours ago.  Nontoxic-appearing sitting comfortably in bed no acute distress.  HENT:  Head: Normocephalic and atraumatic.  Eyes: Conjunctivae are normal.  Neck: Neck supple.  Cardiovascular: Normal rate and regular rhythm.  Pulmonary/Chest: Effort normal. No respiratory distress.  Abdominal: He exhibits no distension.  Musculoskeletal: Normal range of motion. He exhibits edema. He exhibits no tenderness or deformity.  Mild pitting edema to the left lower extremity.  Erythema and warmth to the touch with non-circumferential cellulitis  Neurological: He is alert. No sensory deficit. He exhibits normal muscle tone.  Skin: Skin is warm and dry. He is not diaphoretic.  There is erythema.  Psychiatric: He has a normal mood and affect.  Nursing note and vitals reviewed.    ED Treatments / Results  Labs (all labs ordered are listed, but only abnormal results are displayed) Labs Reviewed  CBC WITH DIFFERENTIAL/PLATELET - Abnormal; Notable for the following components:      Result Value   WBC 14.1 (*)    Neutro Abs 11.4 (*)    Monocytes Absolute 1.2 (*)    All other components within normal limits  BASIC METABOLIC PANEL - Abnormal; Notable for the following components:   Sodium 129 (*)    Potassium 5.2 (*)    Chloride 96 (*)    Glucose, Bld 157 (*)    Calcium 8.2 (*)    All other components within normal limits  I-STAT CG4 LACTIC ACID, ED - Abnormal; Notable for the following components:   Lactic Acid, Venous 2.21 (*)    All other components within normal limits  I-STAT CG4 LACTIC ACID, ED    EKG None  Radiology No results found.  Procedures Procedures (including critical care time)  Medications Ordered in ED Medications  sodium chloride 0.9 % bolus 1,000 mL (0 mLs Intravenous Stopped 11/02/17 2300)  vancomycin (VANCOCIN) IVPB 1000 mg/200  mL premix (0 mg Intravenous Stopped 11/02/17 2035)  ibuprofen (ADVIL,MOTRIN) tablet 600 mg (600 mg Oral Given 11/02/17 2051)     Initial Impression / Assessment and Plan / ED Course  I have reviewed the triage vital signs and the nursing notes.  Pertinent labs & imaging results that were available during my care of the patient were reviewed by me and considered in my medical decision making (see chart for details).    Patient presenting from home after receiving a call from PCP with elevated WBC which resulted from yesterday's blood draw. Patient was started on keflex and has had 3 doses thus far. He reports improvement in his symptoms and no fever today. He has been able to maintain PO hydration but has had no appetite. Overall improving.  Will give a dose of vanc and IV fluid and repeat labs for trending. He is well-appearing, nontoxic and afebrile.  White blood count has been trending down from 22 to14 today.  Initial lactate slightly elevated which resolved after fluids.  No fevers while in the emergency department although patient has been taking ibuprofen.   Patient denies any pain.  On reassessment, he reports significant improvement and has recovered his appetite and cannot wait to leave to eat.  The area was marked for monitoring. Patient has a follow-up appointment with his PCP already scheduled for tomorrow.  We will start him on doxycycline and have him see his PCP tomorrow.  Urged patient to remain well-hydrated and increase his fluid intake. Zofran as needed for nausea.  Was discharged home with close follow-up.  Patient is reliable and his spouse is an OR nurse and will make sure to monitor and return with any worsening.  Discussed strict return precautions patient understands and agrees with plan. Final Clinical Impressions(s) / ED Diagnoses   Final diagnoses:  Cellulitis of left lower extremity    ED Discharge Orders        Ordered    doxycycline (VIBRAMYCIN) 100  MG capsule  2 times daily     11/02/17 2314    ibuprofen (ADVIL,MOTRIN) 600 MG tablet  Every 6 hours PRN     11/02/17 2318    acetaminophen (TYLENOL) 325 MG tablet  Every 6 hours PRN  11/02/17 2318       Georgiana Shore, PA-C 11/02/17 2331    Maia Plan, MD 11/03/17 [phone]

## 2017-11-02 NOTE — Discharge Instructions (Signed)
As discussed, make sure that you stay well-hydrated and take Zofran as needed for nausea.  Alternate between Tylenol and ibuprofen for pain and fever.  Discontinue your Keflex antibiotics and start taking doxycycline twice a day.  Follow up with your primary care provider. Go to [facility] emergency department if you have any worsening symptoms or new concerning symptoms in the meantime.

## 2017-11-02 NOTE — ED Triage Notes (Addendum)
Pt states that he was sent from his PCP because his WBC was elevated from yesterday's lab work.  Pt denies worsening of the cellulitis on his leg, endorses compliance with his abx and denies fever today.  Pt took 200 mg ibuprofen and 325 mg tylenol at 1430.  Pt states he feels much better today than when he was seen in the PCP office yesterday.

## 2017-11-06 DIAGNOSIS — L03116 Cellulitis of left lower limb: Secondary | ICD-10-CM | POA: Diagnosis not present

## 2017-11-10 DIAGNOSIS — L03116 Cellulitis of left lower limb: Secondary | ICD-10-CM | POA: Diagnosis not present

## 2017-11-10 DIAGNOSIS — R600 Localized edema: Secondary | ICD-10-CM | POA: Diagnosis not present

## 2017-11-20 DIAGNOSIS — M5416 Radiculopathy, lumbar region: Secondary | ICD-10-CM | POA: Diagnosis not present

## 2017-11-24 DIAGNOSIS — L03116 Cellulitis of left lower limb: Secondary | ICD-10-CM | POA: Diagnosis not present

## 2018-01-08 MED FILL — TRESIBA FLEXTOUCH 200 UNITS: 200 | 90 days supply | Qty: 54 | Fill #1

## 2018-01-31 DIAGNOSIS — M7989 Other specified soft tissue disorders: Secondary | ICD-10-CM | POA: Diagnosis not present

## 2018-01-31 DIAGNOSIS — R509 Fever, unspecified: Secondary | ICD-10-CM | POA: Diagnosis not present

## 2018-01-31 DIAGNOSIS — K047 Periapical abscess without sinus: Secondary | ICD-10-CM | POA: Insufficient documentation

## 2018-02-01 DIAGNOSIS — R5383 Other fatigue: Secondary | ICD-10-CM | POA: Diagnosis not present

## 2018-02-01 DIAGNOSIS — R509 Fever, unspecified: Secondary | ICD-10-CM | POA: Diagnosis not present

## 2018-03-05 MED FILL — ATORVASTATIN 80 MG TABLET: 80 | 90 days supply | Qty: 90 | Fill #1

## 2018-03-26 DIAGNOSIS — E1165 Type 2 diabetes mellitus with hyperglycemia: Secondary | ICD-10-CM | POA: Diagnosis not present

## 2018-03-26 DIAGNOSIS — Z794 Long term (current) use of insulin: Secondary | ICD-10-CM | POA: Diagnosis not present

## 2018-03-26 DIAGNOSIS — Z23 Encounter for immunization: Secondary | ICD-10-CM | POA: Diagnosis not present

## 2018-03-26 DIAGNOSIS — Z5181 Encounter for therapeutic drug level monitoring: Secondary | ICD-10-CM | POA: Diagnosis not present

## 2018-03-30 MED FILL — NOVOFINE 32G NEEDLES: 32G X 6 MM | 90 days supply | Qty: 100 | Fill #0

## 2018-03-30 MED FILL — TRESIBA FLEXTOUCH 200 UNITS: 200 | 28 days supply | Qty: 18 | Fill #0

## 2018-04-05 MED FILL — TRULICITY 1.5 MG/0.5 ML PEN: 1.5 | 28 days supply | Qty: 2 | Fill #0

## 2018-04-09 DIAGNOSIS — R21 Rash and other nonspecific skin eruption: Secondary | ICD-10-CM | POA: Diagnosis not present

## 2018-04-09 DIAGNOSIS — L03116 Cellulitis of left lower limb: Secondary | ICD-10-CM | POA: Diagnosis not present

## 2018-04-09 DIAGNOSIS — L03119 Cellulitis of unspecified part of limb: Secondary | ICD-10-CM | POA: Diagnosis not present

## 2018-04-09 MED FILL — CEPHALEXIN 500 MG CAPSULE: 500 | 10 days supply | Qty: 30 | Fill #0

## 2018-04-09 MED FILL — DOXYCYCLINE HYCLATE 100 MG: 100 | 10 days supply | Qty: 20 | Fill #0

## 2018-04-26 MED FILL — TADALAFIL 10 MG TABS: 10 | 30 days supply | Qty: 6 | Fill #0

## 2018-04-26 MED FILL — TRESIBA FLEXTOUCH 200 UNITS: 200 | 90 days supply | Qty: 72 | Fill #0

## 2018-05-24 MED FILL — LISINOPRIL 10 MG TABS: 10 | 90 days supply | Qty: 90 | Fill #1

## 2018-05-28 DIAGNOSIS — M25461 Effusion, right knee: Secondary | ICD-10-CM | POA: Diagnosis not present

## 2018-05-28 DIAGNOSIS — M25561 Pain in right knee: Secondary | ICD-10-CM | POA: Diagnosis not present

## 2018-06-08 ENCOUNTER — Ambulatory Visit: Payer: 59 | Admitting: Podiatry

## 2018-06-11 MED FILL — metFORMIN HCL ER 500 MG TB2: 500 | 90 days supply | Qty: 360 | Fill #0

## 2018-06-20 ENCOUNTER — Other Ambulatory Visit: Payer: Self-pay

## 2018-06-20 ENCOUNTER — Encounter: Payer: Self-pay | Admitting: Podiatry

## 2018-06-20 ENCOUNTER — Other Ambulatory Visit: Payer: Self-pay | Admitting: Podiatry

## 2018-06-20 ENCOUNTER — Ambulatory Visit (INDEPENDENT_AMBULATORY_CARE_PROVIDER_SITE_OTHER): Payer: 59

## 2018-06-20 ENCOUNTER — Ambulatory Visit: Payer: 59 | Admitting: Podiatry

## 2018-06-20 DIAGNOSIS — E0843 Diabetes mellitus due to underlying condition with diabetic autonomic (poly)neuropathy: Secondary | ICD-10-CM

## 2018-06-20 DIAGNOSIS — L03116 Cellulitis of left lower limb: Secondary | ICD-10-CM

## 2018-06-20 DIAGNOSIS — L97522 Non-pressure chronic ulcer of other part of left foot with fat layer exposed: Secondary | ICD-10-CM | POA: Diagnosis not present

## 2018-06-20 DIAGNOSIS — M79672 Pain in left foot: Secondary | ICD-10-CM

## 2018-06-20 DIAGNOSIS — I70245 Atherosclerosis of native arteries of left leg with ulceration of other part of foot: Secondary | ICD-10-CM

## 2018-06-20 MED ORDER — SULFAMETHOXAZOLE-TRIMETHOPRIM 800-160 MG PO TABS: 1.0000 | ORAL_TABLET | Freq: Two times a day (BID) | ORAL | 0 refills | Status: DC

## 2018-06-20 MED FILL — SULFAMETHOXAZOLE-TMP DS TAB: 800-160 | 14 days supply | Qty: 28 | Fill #0

## 2018-06-24 NOTE — Progress Notes (Signed)
   Subjective:  53 year old male with PMHx of T2DM presenting today for follow up evaluation of a wound to the left third toe. He states he does not believe the wound is getting any better and he states his toes are turning black. He has applied Gentamicin cream to the wound for treatment. There are no modifying factors noted. Patient is here for further evaluation and treatment.   Past Medical History:  Diagnosis Date  . Chest pain   . Diabetes mellitus   . Gout   . Hyperlipidemia   . Hypertension   . Obesity       Objective/Physical Exam General: The patient is alert and oriented x3 in no acute distress.  Dermatology:  Wound #1 noted to the left third toe measuring 0.5 x 0.5 x 0.2 cm (LxWxD).   To the noted ulceration(s), there is no eschar. There is a moderate amount of slough, fibrin, and necrotic tissue noted. Granulation tissue and wound base is red. There is a minimal amount of serosanguineous drainage noted. There is no exposed bone muscle-tendon ligament or joint. There is no malodor. Periwound integrity is intact. Skin is warm, dry and supple bilateral lower extremities. Erythema and edema noted to the left third toe and left leg.   Vascular: Palpable pedal pulses bilaterally. Capillary refill within normal limits.  Neurological: Epicritic and protective threshold absent bilaterally.   Musculoskeletal Exam: Range of motion within normal limits to all pedal and ankle joints bilateral. Muscle strength 5/5 in all groups bilateral.   Radiographic Exam:  Normal osseous mineralization. Joint spaces preserved. No fracture/dislocation/boney destruction.    Assessment: #1 ulceration of the left third toe secondary to diabetes mellitus #2 Cellulitis left third toe #3 cellulitis left leg    Plan of Care:  #1 Patient was evaluated. X-Rays reviewed.  #2 medically necessary excisional debridement including subcutaneous tissue was performed using a tissue nipper and a chisel  blade. Excisional debridement of all the necrotic nonviable tissue down to healthy bleeding viable tissue was performed with post-debridement measurements same as pre-. #3 the wound was cleansed and dry sterile dressing applied. #4 Recommended Betadine ointment daily with a bandage.  #5 Silicone offloading toe crest paid dispensed.  #6 Prescription for Bactrim DS #28 provided to patient.  #7 Return to clinic in 4 weeks.    Felecia ShellingBrent M. , DPM [facility]  Dr. Felecia ShellingBrent M. , DPM    [address]                                        [place], [place] [id number]                Office [phone]  Fax [phone]

## 2018-07-18 ENCOUNTER — Encounter: Payer: Self-pay | Admitting: Podiatry

## 2018-07-18 ENCOUNTER — Ambulatory Visit: Payer: 59 | Admitting: Podiatry

## 2018-07-18 DIAGNOSIS — E0843 Diabetes mellitus due to underlying condition with diabetic autonomic (poly)neuropathy: Secondary | ICD-10-CM | POA: Diagnosis not present

## 2018-07-18 DIAGNOSIS — L97522 Non-pressure chronic ulcer of other part of left foot with fat layer exposed: Secondary | ICD-10-CM

## 2018-07-18 DIAGNOSIS — B351 Tinea unguium: Secondary | ICD-10-CM | POA: Diagnosis not present

## 2018-07-18 DIAGNOSIS — I70245 Atherosclerosis of native arteries of left leg with ulceration of other part of foot: Secondary | ICD-10-CM | POA: Diagnosis not present

## 2018-07-18 DIAGNOSIS — M79676 Pain in unspecified toe(s): Secondary | ICD-10-CM

## 2018-07-23 NOTE — Progress Notes (Signed)
   SUBJECTIVE Patient with a history of diabetes mellitus presents to office today complaining of elongated, thickened nails that cause pain while ambulating in shoes. He is unable to trim his own nails.  He is also here for follow up evaluation of an ulceration of the left third toe. He states the wound is doing better. He denies any significant pain or modifying factors. Patient is here for further evaluation and treatment.   Past Medical History:  Diagnosis Date  . Chest pain   . Diabetes mellitus   . Gout   . Hyperlipidemia   . Hypertension   . Obesity     OBJECTIVE General Patient is awake, alert, and oriented x 3 and in no acute distress. Derm Wound noted to the left third toe has healed. Complete re-epithelialization has occurred. No drainage noted. Skin is dry and supple bilateral. Negative open lesions or macerations. Remaining integument unremarkable. Nails are tender, long, thickened and dystrophic with subungual debris, consistent with onychomycosis, 1-5 bilateral. No signs of infection noted. Vasc  DP and PT pedal pulses palpable bilaterally. Temperature gradient within normal limits.  Neuro Epicritic and protective threshold sensation diminished bilaterally.  Musculoskeletal Exam No symptomatic pedal deformities noted bilateral. Muscular strength within normal limits.  ASSESSMENT 1. Diabetes Mellitus w/ peripheral neuropathy 2. Onychomycosis of nail due to dermatophyte bilateral 3. Ulceration of the left third toe secondary to diabetes mellitus - healed   PLAN OF CARE 1. Patient evaluated today. 2. Instructed to maintain good pedal hygiene and foot care. Stressed importance of controlling blood sugar.  3. Mechanical debridement of nails 1-5 bilaterally performed using a nail nipper. Filed with dremel without incident.  4. Light debridement of the left third toe performed with a tissue nipper.  5. Return to clinic as needed.     Felecia Shelling, DPM [facility]  Dr. Felecia Shelling, DPM    [address]                                        [place], [place] [address]                Office [phone]  Fax [phone]

## 2018-07-25 MED FILL — ATORVASTATIN 80 MG TABLET: 80 | 90 days supply | Qty: 90 | Fill #0

## 2018-08-02 MED FILL — TRESIBA FLEXTOUCH 200 UNITS: 200 | 90 days supply | Qty: 72 | Fill #1 | Status: TO

## 2018-08-03 MED FILL — NOVOFINE 32G NEEDLES: 32G X 6 MM | 90 days supply | Qty: 100 | Fill #1

## 2018-10-17 ENCOUNTER — Ambulatory Visit: Payer: 59 | Admitting: Podiatry

## 2018-10-22 ENCOUNTER — Encounter: Payer: Self-pay | Admitting: Podiatry

## 2018-10-22 ENCOUNTER — Other Ambulatory Visit: Payer: Self-pay | Admitting: Podiatry

## 2018-10-22 ENCOUNTER — Other Ambulatory Visit: Payer: Self-pay

## 2018-10-22 ENCOUNTER — Ambulatory Visit (INDEPENDENT_AMBULATORY_CARE_PROVIDER_SITE_OTHER): Payer: 59

## 2018-10-22 ENCOUNTER — Ambulatory Visit: Payer: 59 | Admitting: Podiatry

## 2018-10-22 VITALS — Temp 97.5°F

## 2018-10-22 DIAGNOSIS — M25571 Pain in right ankle and joints of right foot: Secondary | ICD-10-CM

## 2018-10-22 DIAGNOSIS — M79676 Pain in unspecified toe(s): Secondary | ICD-10-CM

## 2018-10-22 DIAGNOSIS — E0843 Diabetes mellitus due to underlying condition with diabetic autonomic (poly)neuropathy: Secondary | ICD-10-CM | POA: Diagnosis not present

## 2018-10-22 DIAGNOSIS — M659 Synovitis and tenosynovitis, unspecified: Secondary | ICD-10-CM

## 2018-10-22 DIAGNOSIS — B351 Tinea unguium: Secondary | ICD-10-CM

## 2018-10-22 DIAGNOSIS — L97522 Non-pressure chronic ulcer of other part of left foot with fat layer exposed: Secondary | ICD-10-CM | POA: Diagnosis not present

## 2018-10-22 DIAGNOSIS — M65971 Unspecified synovitis and tenosynovitis, right ankle and foot: Secondary | ICD-10-CM

## 2018-10-24 NOTE — Progress Notes (Signed)
   SUBJECTIVE Patient with a history of diabetes mellitus presents to office today complaining of elongated, thickened nails that cause pain while ambulating in shoes. He is unable to trim his own nails.  He is also here for follow up evaluation of an ulceration of the left third toe. He has been applying Betadine ointment to the wound. He denies any significant pain or modifying factors.  He also notes pain of the right ankle that began about one month ago. He states the ankle feels like it is locking up when he walks. He has not done anything for treatment. Patient is here for further evaluation and treatment.   Past Medical History:  Diagnosis Date  . Chest pain   . Diabetes mellitus   . Gout   . Hyperlipidemia   . Hypertension   . Obesity     OBJECTIVE General Patient is awake, alert, and oriented x 3 and in no acute distress.  Derm Wound #1 noted to the left third toe measuring 0.5 x 0.5 x 0.2 cm.   To the above-noted ulceration, there is no eschar. There is a moderate amount of slough, fibrin and necrotic tissue. Granulation tissue and wound base is red. There is no malodor. There is a minimal amount of serosanginous drainage noted. Periwound integrity is intact.  Nails are tender, long, thickened and dystrophic with subungual debris, consistent with onychomycosis, 1-5 bilateral. No signs of infection noted.  Vasc  DP and PT pedal pulses palpable bilaterally. Temperature gradient within normal limits.   Neuro Epicritic and protective threshold sensation diminished bilaterally.   Musculoskeletal Exam Pain with palpation noted to the anterior, lateral and medial aspects of the right ankle joint. No symptomatic pedal deformities noted bilateral. Muscular strength within normal limits.  Radiographic Exam:  Normal osseous mineralization. Joint spaces preserved. No fracture/dislocation/boney destruction.    ASSESSMENT 1. Diabetes Mellitus w/ peripheral neuropathy 2. Onychomycosis  of nail due to dermatophyte bilateral 3. Ulceration of the left third toe secondary to diabetes mellitus  4. Right ankle synovitis   PLAN OF CARE 1. Patient evaluated today. X-Rays reviewed.  2. Instructed to maintain good pedal hygiene and foot care. Stressed importance of controlling blood sugar.  3. Mechanical debridement of nails 1-5 bilaterally performed using a nail nipper. Filed with dremel without incident.  4. Medically necessary excisional debridement including subcutaneous tissue was performed using a tissue nipper and a chisel blade. Excisional debridement of all the necrotic nonviable tissue down to healthy bleeding viable tissue was performed with post-debridement measurements same as pre-. 5. The wound was cleansed and dry sterile dressing applied. 6. Continue using Betadine ointment daily with a bandage.  7. Injection of 0.5 mLs Celestone Soluspan injected into the right ankle joint.  8. Return to clinic in 3 months.    Felecia Shelling, DPM [facility]  Dr. Felecia Shelling, DPM    [address]                                        [place], [place] [address]                Office [phone]  Fax [phone]

## 2018-10-25 MED FILL — TRESIBA FLEXTOUCH 200 UNITS: 200 | 90 days supply | Qty: 72 | Fill #0

## 2018-10-26 MED FILL — LISINOPRIL 10 MG TABLET: 10 | 90 days supply | Qty: 90 | Fill #0

## 2018-11-19 MED FILL — metFORMIN HCL ER 500 MG TB2: 500 | 90 days supply | Qty: 360 | Fill #0

## 2018-12-14 DIAGNOSIS — M10071 Idiopathic gout, right ankle and foot: Secondary | ICD-10-CM | POA: Diagnosis not present

## 2018-12-14 DIAGNOSIS — M25571 Pain in right ankle and joints of right foot: Secondary | ICD-10-CM | POA: Diagnosis not present

## 2019-01-11 MED FILL — ATORVASTATIN 80 MG TABLET: 80 | 90 days supply | Qty: 90 | Fill #0

## 2019-01-18 DIAGNOSIS — E119 Type 2 diabetes mellitus without complications: Secondary | ICD-10-CM | POA: Diagnosis not present

## 2019-01-18 DIAGNOSIS — H5211 Myopia, right eye: Secondary | ICD-10-CM | POA: Diagnosis not present

## 2019-01-18 DIAGNOSIS — H52223 Regular astigmatism, bilateral: Secondary | ICD-10-CM | POA: Diagnosis not present

## 2019-01-18 DIAGNOSIS — H524 Presbyopia: Secondary | ICD-10-CM | POA: Diagnosis not present

## 2019-01-21 ENCOUNTER — Encounter: Payer: Self-pay | Admitting: Podiatry

## 2019-01-21 ENCOUNTER — Ambulatory Visit: Payer: 59 | Admitting: Podiatry

## 2019-01-21 ENCOUNTER — Other Ambulatory Visit: Payer: Self-pay

## 2019-01-21 DIAGNOSIS — M659 Synovitis and tenosynovitis, unspecified: Secondary | ICD-10-CM

## 2019-01-21 DIAGNOSIS — L97522 Non-pressure chronic ulcer of other part of left foot with fat layer exposed: Secondary | ICD-10-CM

## 2019-01-21 DIAGNOSIS — M65971 Unspecified synovitis and tenosynovitis, right ankle and foot: Secondary | ICD-10-CM

## 2019-01-21 DIAGNOSIS — B351 Tinea unguium: Secondary | ICD-10-CM

## 2019-01-21 DIAGNOSIS — E0843 Diabetes mellitus due to underlying condition with diabetic autonomic (poly)neuropathy: Secondary | ICD-10-CM

## 2019-01-21 DIAGNOSIS — M79676 Pain in unspecified toe(s): Secondary | ICD-10-CM | POA: Diagnosis not present

## 2019-01-22 ENCOUNTER — Other Ambulatory Visit: Payer: Self-pay | Admitting: *Deleted

## 2019-01-22 DIAGNOSIS — Z20822 Contact with and (suspected) exposure to covid-19: Secondary | ICD-10-CM

## 2019-01-23 LAB — NOVEL CORONAVIRUS, NAA: SARS-CoV-2, NAA: NOT DETECTED

## 2019-01-23 NOTE — Progress Notes (Signed)
   SUBJECTIVE Patient with a history of diabetes mellitus presents to office today complaining of elongated, thickened nails that cause pain while ambulating in shoes. He is unable to trim his own nails.  He is also here for follow up evaluation of an ulceration of the left third toe. He has been applying Betadine ointment to the wound. He denies any significant pain or modifying factors.  He is also here for follow up evaluation of pain of the right ankle. Walking increases the pain. He states the injection he received at the last visit helped alleviate the symptoms. Patient is here for further evaluation and treatment.   Past Medical History:  Diagnosis Date  . Chest pain   . Diabetes mellitus   . Gout   . Hyperlipidemia   . Hypertension   . Obesity     OBJECTIVE General Patient is awake, alert, and oriented x 3 and in no acute distress.  Derm Wound #1 noted to the left third toe measuring 0.6 x 0.6 x 0.2 cm.   To the above-noted ulceration, there is no eschar. There is a moderate amount of slough, fibrin and necrotic tissue. Granulation tissue and wound base is red. There is no malodor. There is a minimal amount of serosanginous drainage noted. Periwound integrity is intact.  Nails are tender, long, thickened and dystrophic with subungual debris, consistent with onychomycosis, 1-5 bilateral. No signs of infection noted.  Vasc  DP and PT pedal pulses palpable bilaterally. Temperature gradient within normal limits.   Neuro Epicritic and protective threshold sensation diminished bilaterally.   Musculoskeletal Exam Pain with palpation noted to the anterior, lateral and medial aspects of the right ankle joint. No symptomatic pedal deformities noted bilateral. Muscular strength within normal limits.  ASSESSMENT 1. Diabetes Mellitus w/ peripheral neuropathy 2. Onychomycosis of nail due to dermatophyte bilateral 3. Ulceration of the left third toe secondary to diabetes mellitus  4.  Right ankle synovitis   PLAN OF CARE 1. Patient evaluated today.  2. Instructed to maintain good pedal hygiene and foot care. Stressed importance of controlling blood sugar.  3. Mechanical debridement of nails 1-5 bilaterally performed using a nail nipper. Filed with dremel without incident.  4. Medically necessary excisional debridement including subcutaneous tissue was performed using a tissue nipper and a chisel blade. Excisional debridement of all the necrotic nonviable tissue down to healthy bleeding viable tissue was performed with post-debridement measurements same as pre-. 5. The wound was cleansed and dry sterile dressing applied. 6. Continue using Betadine ointment daily with a dry sterile dressing.  7. Injection of 0.5 mLs Celestone Soluspan injected into the right ankle joint.  8. Return to clinic in 3 months.   Works at [employer].    Edrick Kins, DPM [facility]  Dr. Edrick Kins, [address]                                        [place], [place] [address]                Office [phone]  Fax [phone]

## 2019-02-01 DIAGNOSIS — Z794 Long term (current) use of insulin: Secondary | ICD-10-CM | POA: Diagnosis not present

## 2019-02-01 DIAGNOSIS — E1165 Type 2 diabetes mellitus with hyperglycemia: Secondary | ICD-10-CM | POA: Diagnosis not present

## 2019-02-01 DIAGNOSIS — L03116 Cellulitis of left lower limb: Secondary | ICD-10-CM | POA: Diagnosis not present

## 2019-02-01 MED FILL — DOXYCYCLINE HYC 100 MG CAPS: 100 | 10 days supply | Qty: 20 | Fill #0

## 2019-02-01 MED FILL — CEPHALEXIN 500 MG CAPSULE: 500 | 10 days supply | Qty: 40 | Fill #0

## 2019-02-12 MED FILL — TRESIBA FLEXTOUCH 200 UNITS: 200 | 90 days supply | Qty: 72 | Fill #0

## 2019-04-08 DIAGNOSIS — Z23 Encounter for immunization: Secondary | ICD-10-CM | POA: Diagnosis not present

## 2019-04-08 DIAGNOSIS — Z794 Long term (current) use of insulin: Secondary | ICD-10-CM | POA: Diagnosis not present

## 2019-04-08 DIAGNOSIS — E1165 Type 2 diabetes mellitus with hyperglycemia: Secondary | ICD-10-CM | POA: Diagnosis not present

## 2019-04-08 DIAGNOSIS — R946 Abnormal results of thyroid function studies: Secondary | ICD-10-CM | POA: Diagnosis not present

## 2019-04-08 DIAGNOSIS — Z79899 Other long term (current) drug therapy: Secondary | ICD-10-CM | POA: Diagnosis not present

## 2019-04-08 DIAGNOSIS — R5383 Other fatigue: Secondary | ICD-10-CM | POA: Diagnosis not present

## 2019-04-10 DIAGNOSIS — Z794 Long term (current) use of insulin: Secondary | ICD-10-CM | POA: Diagnosis not present

## 2019-04-10 DIAGNOSIS — U071 COVID-19: Secondary | ICD-10-CM | POA: Diagnosis not present

## 2019-04-10 DIAGNOSIS — E1165 Type 2 diabetes mellitus with hyperglycemia: Secondary | ICD-10-CM | POA: Diagnosis not present

## 2019-04-10 MED FILL — JARDIANCE 10 MG TABLET: 10 | 30 days supply | Qty: 30 | Fill #0

## 2019-04-12 ENCOUNTER — Emergency Department (HOSPITAL_BASED_OUTPATIENT_CLINIC_OR_DEPARTMENT_OTHER)
Admission: EM | Admit: 2019-04-12 | Discharge: 2019-04-13 | Disposition: A | Discharge status: Home / Self Care | Payer: 59 | Attending: Emergency Medicine | Admitting: Emergency Medicine

## 2019-04-12 ENCOUNTER — Other Ambulatory Visit: Payer: Self-pay

## 2019-04-12 ENCOUNTER — Encounter (HOSPITAL_BASED_OUTPATIENT_CLINIC_OR_DEPARTMENT_OTHER): Payer: Self-pay

## 2019-04-12 DIAGNOSIS — Z79899 Other long term (current) drug therapy: Secondary | ICD-10-CM | POA: Insufficient documentation

## 2019-04-12 DIAGNOSIS — Z794 Long term (current) use of insulin: Secondary | ICD-10-CM | POA: Diagnosis not present

## 2019-04-12 DIAGNOSIS — E119 Type 2 diabetes mellitus without complications: Secondary | ICD-10-CM | POA: Insufficient documentation

## 2019-04-12 DIAGNOSIS — I10 Essential (primary) hypertension: Secondary | ICD-10-CM | POA: Insufficient documentation

## 2019-04-12 DIAGNOSIS — R110 Nausea: Secondary | ICD-10-CM | POA: Insufficient documentation

## 2019-04-12 DIAGNOSIS — Z7982 Long term (current) use of aspirin: Secondary | ICD-10-CM | POA: Insufficient documentation

## 2019-04-12 LAB — CBG MONITORING, ED: Glucose-Capillary: 136 mg/dL — ABNORMAL HIGH (ref 70–99)

## 2019-04-12 MED ORDER — SODIUM CHLORIDE 0.9 % IV BOLUS
1000.0000 mL | Freq: Once | INTRAVENOUS | Status: AC
  Administered 2019-04-13: 1000 mL via INTRAVENOUS

## 2019-04-12 MED FILL — PROMETHAZINE 25 MG TABLET: 25 | 7 days supply | Qty: 30 | Fill #0

## 2019-04-12 NOTE — ED Provider Notes (Signed)
[facility] Provider Note: Lowella Dell, MD, FACEP  CSN: [id number] MRN: [id number] ARRIVAL: 04/12/19 at 2204 ROOM: MH12/MH12   CHIEF COMPLAINT  Nausea   HISTORY OF PRESENT ILLNESS  04/12/19 11:18 PM Martin [name] is a 53 y.o. male with a 6-day history of general fatigue and decreased appetite.  The decrease in appetite is primarily due to nausea and retching.  He has been able to keep liquids down but has had little to eat both because of nausea and lack of appetite.  He denies fever, change in smell or taste, shortness of breath, chest pain, abdominal pain, body aches or diarrhea.  He has had bowel movements that were of normal quality.  He was seen by his PCP 4 days ago and was told that his thyroid functions were abnormal but I do not have access to these results.  He has tried Zofran and Phenergan without relief of his nausea and retching.    He was seen again 2 days ago and tested for Covid, the results are still pending.  He has chronic edema of the left lower leg.   Past Medical History:  Diagnosis Date  . Chest pain   . Diabetes mellitus   . Gout   . Hyperlipidemia   . Hypertension   . Obesity     Past Surgical History:  Procedure Laterality Date  . UMBILICAL HERNIA REPAIR      Family History  Problem Relation Age of Onset  . Diabetes Maternal Grandfather   . Kidney disease Other     Social History   Tobacco Use  . Smoking status: Never Smoker  . Smokeless tobacco: Current User    Types: Chew  Substance Use Topics  . Alcohol use: Yes    Alcohol/week: 0.0 standard drinks    Comment: occ  . Drug use: No    Prior to Admission medications   Medication Sig Start Date End Date Taking? Authorizing Provider  acetaminophen (TYLENOL) 325 MG tablet Take 2 tablets (650 mg total) by mouth every 6 (six) hours as needed for mild pain or moderate pain. 11/02/17   Georgiana Shore, PA-C  aspirin 81 MG tablet Take 81 mg by mouth daily.      [provider]  atorvastatin (LIPITOR) 80 MG tablet Take 80 mg by mouth daily.    [provider]  clindamycin (CLEOCIN) 300 MG capsule Take 1 capsule (300 mg total) by mouth 3 (three) times daily. X 7 days 06/18/17   Charlynne Pander, MD  Diclofenac Sodium CR (VOLTAREN-XR) 100 MG 24 hr tablet Take 1 tablet (100 mg total) by mouth daily. 06/20/17   Palumbo, April, MD  Dulaglutide (TRULICITY) 1.5 MG/0.5ML SOPN Inject 1.5 mg into the skin once a week.    [provider]  fenofibrate 160 MG tablet Take 160 mg by mouth daily.    [provider]  gabapentin (NEURONTIN) 300 MG capsule Take 1 capsule (300 mg total) by mouth 3 (three) times daily. Patient taking differently: Take 300 mg by mouth 3 (three) times daily as needed.  10/05/13   Nelva Nay, MD  gentamicin cream (GARAMYCIN) 0.1 % Apply 1 application topically 3 (three) times daily. 07/19/17   Felecia Shelling, DPM  ibuprofen (ADVIL,MOTRIN) 600 MG tablet Take 1 tablet (600 mg total) by mouth every 6 (six) hours as needed. 11/02/17   Mathews Robinsons B, PA-C  indomethacin (INDOCIN) 50 MG capsule Take 1 capsule (50 mg total) by mouth  3 (three) times daily with meals. 10/14/17   Ward, Chase Picket, PA-C  Insulin Degludec (TRESIBA FLEXTOUCH) 200 UNIT/ML SOPN Inject 80 Units into the skin daily.    [provider]  insulin glargine (LANTUS) 100 UNIT/ML injection Inject 100 Units into the skin at bedtime.  08/17/12   Carlus Pavlov, MD  lisinopril (PRINIVIL,ZESTRIL) 10 MG tablet Take 10 mg by mouth daily.    [provider]  metFORMIN (GLUCOPHAGE-XR) 500 MG 24 hr tablet  06/11/18   [provider]  naproxen sodium (ANAPROX) 220 MG tablet Take 220 mg by mouth 3 (three) times daily with meals.    [provider]  omega-3 acid ethyl esters (LOVAZA) 1 G capsule TAKE 1 CAPSULE BY MOUTH 2 TIMES DAILY WITH MEALS 02/06/14   Carlus Pavlov, MD  [facility] VERIO test strip USE AS INSTRUCTED 2-3 X A DAY.  08/07/13   Carlus Pavlov, MD  oxyCODONE-acetaminophen (PERCOCET) 5-325 MG tablet Take 1-2 tablets by mouth every 4 (four) hours as needed. 10/15/17   Harris, Cammy Copa, PA-C  sulfamethoxazole-trimethoprim (BACTRIM DS,SEPTRA DS) 800-160 MG tablet Take 1 tablet by mouth 2 (two) times daily. 06/20/18   Felecia Shelling, DPM  tadalafil (CIALIS) 10 MG tablet  04/26/18   [provider]  traMADol (ULTRAM) 50 MG tablet Take 1 tablet (50 mg total) by mouth every 6 (six) hours as needed. 10/14/17   Ward, Chase Picket, PA-C    Allergies Patient has no known allergies.   REVIEW OF SYSTEMS  Negative except as noted here or in the History of Present Illness.   PHYSICAL EXAMINATION  Initial Vital Signs Blood pressure 132/72, pulse 83, temperature 99.2 F (37.3 C), temperature source Oral, resp. rate 18, height  (1.803 m), weight 119 kg, SpO2 96 %.  Examination General: Well-developed, well-nourished male in no acute distress; appearance consistent with age of record HENT: normocephalic; atraumatic; no thyromegaly or tenderness Eyes: pupils equal, round and reactive to light; extraocular muscles intact Neck: supple Heart: regular rate and rhythm Lungs: clear to auscultation bilaterally Abdomen: soft; nondistended; nontender; no masses or hepatosplenomegaly; bowel sounds present Extremities: No deformity; full range of motion; pulses normal; edema of left lower leg Neurologic: Awake, alert and oriented; motor function intact in all extremities and symmetric; no facial droop Skin: Warm and dry Psychiatric: Flat affect   RESULTS  Summary of this visit's results, reviewed and interpreted by myself:   EKG Interpretation  Date/Time:    Ventricular Rate:    PR Interval:    QRS Duration:   QT Interval:    QTC Calculation:   R Axis:     Text Interpretation:        Laboratory Studies: Results for orders placed or performed during the hospital encounter of 04/12/19 (from the past  24 hour(s))  CBG monitoring, ED     Status: Abnormal   Collection Time: 04/12/19 11:56 PM  Result Value Ref Range   Glucose-Capillary 136 (H) 70 - 99 mg/dL  CBC with Differential     Status: None   Collection Time: 04/12/19 11:56 PM  Result Value Ref Range   WBC 6.3 4.0 - 10.5 K/uL   RBC 5.33 4.22 - 5.81 MIL/uL   Hemoglobin 14.8 13.0 - 17.0 g/dL   HCT 95.6 21.3 - 08.6 %   MCV 83.9 80.0 - 100.0 fL   MCH 27.8 26.0 - 34.0 pg   MCHC 33.1 30.0 - 36.0 g/dL   RDW 57.8 46.9 - 62.9 %  Platelets 188 150 - 400 K/uL   nRBC 0.0 0.0 - 0.2 %   Neutrophils Relative % 64 %   Neutro Abs 4.1 1.7 - 7.7 K/uL   Lymphocytes Relative 23 %   Lymphs Abs 1.4 0.7 - 4.0 K/uL   Monocytes Relative 10 %   Monocytes Absolute 0.6 0.1 - 1.0 K/uL   Eosinophils Relative 2 %   Eosinophils Absolute 0.1 0.0 - 0.5 K/uL   Basophils Relative 0 %   Basophils Absolute 0.0 0.0 - 0.1 K/uL   Immature Granulocytes 1 %   Abs Immature Granulocytes 0.04 0.00 - 0.07 K/uL  Comprehensive metabolic panel     Status: Abnormal   Collection Time: 04/12/19 11:56 PM  Result Value Ref Range   Sodium 132 (L) 135 - 145 mmol/L   Potassium 4.1 3.5 - 5.1 mmol/L   Chloride 98 98 - 111 mmol/L   CO2 23 22 - 32 mmol/L   Glucose, Bld 135 (H) 70 - 99 mg/dL   BUN 14 6 - 20 mg/dL   Creatinine, Ser 1.610.95 0.61 - 1.24 mg/dL   Calcium 8.6 (L) 8.9 - 10.3 mg/dL   Total Protein 7.3 6.5 - 8.1 g/dL   Albumin 3.3 (L) 3.5 - 5.0 g/dL   AST 35 15 - 41 U/L   ALT 29 0 - 44 U/L   Alkaline Phosphatase 71 38 - 126 U/L   Total Bilirubin 0.7 0.3 - 1.2 mg/dL   GFR calc non Af Amer >60 >60 mL/min   GFR calc Af Amer >60 >60 mL/min   Anion gap 11 5 - 15  Urinalysis, Routine w reflex microscopic     Status: Abnormal   Collection Time: 04/13/19 12:30 AM  Result Value Ref Range   Color, Urine YELLOW YELLOW   APPearance CLEAR CLEAR   Specific Gravity, Urine 1.020 1.005 - 1.030   pH 6.0 5.0 - 8.0   Glucose, UA >=500 (A) NEGATIVE mg/dL   Hgb urine dipstick  MODERATE (A) NEGATIVE   Bilirubin Urine NEGATIVE NEGATIVE   Ketones, ur NEGATIVE NEGATIVE mg/dL   Protein, ur NEGATIVE NEGATIVE mg/dL   Nitrite NEGATIVE NEGATIVE   Leukocytes,Ua NEGATIVE NEGATIVE  Urinalysis, Microscopic (reflex)     Status: None   Collection Time: 04/13/19 12:30 AM  Result Value Ref Range   RBC / HPF 6-10 0 - 5 RBC/hpf   WBC, UA 0-5 0 - 5 WBC/hpf   Bacteria, UA NONE SEEN NONE SEEN   Squamous Epithelial / LPF 0-5 0 - 5   Uric Acid Crys, UA PRESENT    Imaging Studies: No results found.  ED COURSE and MDM  Nursing notes, initial and subsequent vitals signs, including pulse oximetry, reviewed and interpreted by myself.  Vitals:   04/12/19 2213  BP: 132/72  Pulse: 83  Resp: 18  Temp: 99.2 F (37.3 C)  TempSrc: Oral  SpO2: 96%  Weight: 119 kg  Height: 5\' 11"  (1.803 m)   Medications  sodium chloride 0.9 % bolus 1,000 mL ( Intravenous Stopped 04/13/19 0151)  metoCLOPramide (REGLAN) injection 10 mg (10 mg Intravenous Given 04/13/19 0041)  ondansetron (ZOFRAN) injection 4 mg (4 mg Intravenous Given 04/13/19 0118)   1:14 AM Patient has been able to complete 2 containers of soda but states he still feels queasy.  He feels somewhat better after Reglan but not back to normal.  There are no gross abnormalities seen on laboratory studies.  He cannot get any x-rays because he just had a Freestyle  Libra implanted and I do not believe the benefits of an x-ray with the expense of discarding his new Libra.  2:02 AM Nausea significantly improved after addition of IV Zofran.  Will send him home on Phenergan and Zofran, which work by different mechanisms, but have him discontinue the Phenergan.  There are no significant lab abnormalities.  He was advised to follow-up with his PCP especially given that he will need his thyroid abnormalities dealt with.  PROCEDURES  Procedures   ED DIAGNOSES     ICD-10-CM   1. Nausea in adult  R11.0        Katai Marsico, MD 04/13/19 [phone]

## 2019-04-12 NOTE — ED Triage Notes (Signed)
Pt c/o nausea, decreased appetite x 6 days-no relief with zofran or phenergan-NAD-steady gait

## 2019-04-13 ENCOUNTER — Emergency Department (HOSPITAL_BASED_OUTPATIENT_CLINIC_OR_DEPARTMENT_OTHER): Payer: 59

## 2019-04-13 DIAGNOSIS — R110 Nausea: Secondary | ICD-10-CM | POA: Diagnosis not present

## 2019-04-13 DIAGNOSIS — E119 Type 2 diabetes mellitus without complications: Secondary | ICD-10-CM | POA: Diagnosis not present

## 2019-04-13 DIAGNOSIS — Z794 Long term (current) use of insulin: Secondary | ICD-10-CM | POA: Diagnosis not present

## 2019-04-13 DIAGNOSIS — Z7982 Long term (current) use of aspirin: Secondary | ICD-10-CM | POA: Diagnosis not present

## 2019-04-13 DIAGNOSIS — Z79899 Other long term (current) drug therapy: Secondary | ICD-10-CM | POA: Diagnosis not present

## 2019-04-13 DIAGNOSIS — I10 Essential (primary) hypertension: Secondary | ICD-10-CM | POA: Diagnosis not present

## 2019-04-13 LAB — URINALYSIS, ROUTINE W REFLEX MICROSCOPIC
Bilirubin Urine: NEGATIVE
Glucose, UA: >=500 mg/dL — AB
Ketones, ur: NEGATIVE mg/dL
Leukocytes,Ua: NEGATIVE
Nitrite: NEGATIVE
Protein, ur: NEGATIVE mg/dL
Specific Gravity, Urine: 1.02 (ref 1.005–1.030)
pH: 6 (ref 5.0–8.0)

## 2019-04-13 LAB — URINALYSIS, MICROSCOPIC (REFLEX): Bacteria, UA: NONE SEEN

## 2019-04-13 LAB — CBC WITH DIFFERENTIAL/PLATELET
Abs Immature Granulocytes: 0.04 10*3/uL (ref 0.00–0.07)
Basophils Absolute: 0 10*3/uL (ref 0.0–0.1)
Basophils Relative: 0 %
Eosinophils Absolute: 0.1 10*3/uL (ref 0.0–0.5)
Eosinophils Relative: 2 %
HCT: 44.7 % (ref 39.0–52.0)
Hemoglobin: 14.8 g/dL (ref 13.0–17.0)
Immature Granulocytes: 1 %
Lymphocytes Relative: 23 %
Lymphs Abs: 1.4 10*3/uL (ref 0.7–4.0)
MCH: 27.8 pg (ref 26.0–34.0)
MCHC: 33.1 g/dL (ref 30.0–36.0)
MCV: 83.9 fL (ref 80.0–100.0)
Monocytes Absolute: 0.6 10*3/uL (ref 0.1–1.0)
Monocytes Relative: 10 %
Neutro Abs: 4.1 10*3/uL (ref 1.7–7.7)
Neutrophils Relative %: 64 %
Platelets: 188 10*3/uL (ref 150–400)
RBC: 5.33 MIL/uL (ref 4.22–5.81)
RDW: 12.5 % (ref 11.5–15.5)
WBC: 6.3 10*3/uL (ref 4.0–10.5)
nRBC: 0 % (ref 0.0–0.2)

## 2019-04-13 LAB — COMPREHENSIVE METABOLIC PANEL
ALT: 29 U/L (ref 0–44)
AST: 35 U/L (ref 15–41)
Albumin: 3.3 g/dL — ABNORMAL LOW (ref 3.5–5.0)
Alkaline Phosphatase: 71 U/L (ref 38–126)
Anion gap: 11 (ref 5–15)
BUN: 14 mg/dL (ref 6–20)
CO2: 23 mmol/L (ref 22–32)
Calcium: 8.6 mg/dL — ABNORMAL LOW (ref 8.9–10.3)
Chloride: 98 mmol/L (ref 98–111)
Creatinine, Ser: 0.95 mg/dL (ref 0.61–1.24)
GFR calc Af Amer: >60 mL/min (ref >60)
GFR calc non Af Amer: >60 mL/min (ref >60)
Glucose, Bld: 135 mg/dL — ABNORMAL HIGH (ref 70–99)
Potassium: 4.1 mmol/L (ref 3.5–5.1)
Sodium: 132 mmol/L — ABNORMAL LOW (ref 135–145)
Total Bilirubin: 0.7 mg/dL (ref 0.3–1.2)
Total Protein: 7.3 g/dL (ref 6.5–8.1)

## 2019-04-13 MED ORDER — METOCLOPRAMIDE HCL 10 MG PO TABS
10.0000 mg | ORAL_TABLET | Freq: Once | ORAL | Status: AC
  Administered 2019-04-13: 10 mg via ORAL
  Filled 2019-04-13: qty 1

## 2019-04-13 MED ORDER — ONDANSETRON 8 MG PO TBDP: 8.0000 mg | ORAL_TABLET | Freq: Three times a day (TID) | ORAL | 1 refills | Status: DC | PRN

## 2019-04-13 MED ORDER — METOCLOPRAMIDE HCL 5 MG/ML IJ SOLN
10.0000 mg | Freq: Once | INTRAMUSCULAR | Status: AC
  Administered 2019-04-13: 10 mg via INTRAVENOUS
  Filled 2019-04-13: qty 2

## 2019-04-13 MED ORDER — ONDANSETRON HCL 4 MG/2ML IJ SOLN
4.0000 mg | Freq: Once | INTRAMUSCULAR | Status: AC
  Administered 2019-04-13: 4 mg via INTRAVENOUS
  Filled 2019-04-13: qty 2

## 2019-04-13 MED ORDER — METOCLOPRAMIDE HCL 10 MG PO TABS: 10.0000 mg | ORAL_TABLET | Freq: Four times a day (QID) | ORAL | 0 refills | Status: DC | PRN

## 2019-04-13 NOTE — ED Notes (Signed)
ED Provider at bedside. 

## 2019-04-13 NOTE — ED Notes (Signed)
Gave patient sprite and coke for po challenge.

## 2019-04-22 ENCOUNTER — Ambulatory Visit: Payer: 59 | Admitting: Podiatry

## 2019-04-23 MED FILL — LISINOPRIL 10 MG TABS: 10 | 90 days supply | Qty: 90 | Fill #0

## 2019-04-23 MED FILL — NOVOFINE 32G NEEDLES: 32G X 6 MM | 90 days supply | Qty: 100 | Fill #0

## 2019-04-26 MED FILL — TRESIBA FLEXTOUCH 200 UNITS: 200 | 90 days supply | Qty: 72 | Fill #0

## 2019-04-26 MED FILL — METFORMIN HCL ER 500 MG TB2: 500 | 90 days supply | Qty: 360 | Fill #1

## 2019-05-22 ENCOUNTER — Ambulatory Visit: Payer: 59 | Admitting: Podiatry

## 2019-07-01 MED FILL — ATORVASTATIN 80 MG TABLET: 80 | 90 days supply | Qty: 90 | Fill #1

## 2019-07-04 MED FILL — TADALAFIL 10 MG TABS: 10 | 30 days supply | Qty: 6 | Fill #0

## 2019-07-11 DIAGNOSIS — R5383 Other fatigue: Secondary | ICD-10-CM | POA: Diagnosis not present

## 2019-07-11 DIAGNOSIS — R946 Abnormal results of thyroid function studies: Secondary | ICD-10-CM | POA: Diagnosis not present

## 2019-07-11 DIAGNOSIS — E781 Pure hyperglyceridemia: Secondary | ICD-10-CM | POA: Diagnosis not present

## 2019-07-11 DIAGNOSIS — Z7189 Other specified counseling: Secondary | ICD-10-CM | POA: Diagnosis not present

## 2019-07-11 DIAGNOSIS — E1165 Type 2 diabetes mellitus with hyperglycemia: Secondary | ICD-10-CM | POA: Diagnosis not present

## 2019-07-11 DIAGNOSIS — Z5181 Encounter for therapeutic drug level monitoring: Secondary | ICD-10-CM | POA: Diagnosis not present

## 2019-07-11 DIAGNOSIS — Z794 Long term (current) use of insulin: Secondary | ICD-10-CM | POA: Diagnosis not present

## 2019-07-11 MED FILL — JARDIANCE 10 MG TABLET: 10 | 90 days supply | Qty: 90 | Fill #0

## 2019-07-23 MED FILL — TRULICITY 0.75 MG/0.5 ML PE: 0.75 | 28 days supply | Qty: 2 | Fill #0

## 2019-08-26 MED FILL — FREESTYLE LITE TEST STRIP: 83 days supply | Qty: 250 | Fill #0

## 2019-08-26 MED FILL — FREESTYLE LANCETS: 66 days supply | Qty: 200 | Fill #0

## 2019-08-26 MED FILL — FREESTYLE LITE METER: 1 days supply | Qty: 1 | Fill #0

## 2019-09-20 MED FILL — TRESIBA FLEXTOUCH 200 UNITS: 200 | 90 days supply | Qty: 72 | Fill #1

## 2019-09-23 MED FILL — LISINOPRIL 10 MG TABS: 10 | 90 days supply | Qty: 90 | Fill #1

## 2019-09-25 MED FILL — JARDIANCE 25 MG TABLET: 25 | 90 days supply | Qty: 90 | Fill #0

## 2019-10-11 MED FILL — METFORMIN HCL ER 500 MG TB2: 500 | 90 days supply | Qty: 360 | Fill #2

## 2019-12-05 MED FILL — ATORVASTATIN 80 MG TABLET: 80 | 90 days supply | Qty: 90 | Fill #2

## 2020-01-23 MED FILL — TRESIBA FLEXTOUCH 200 UNITS: 200 | 90 days supply | Qty: 72 | Fill #2

## 2020-03-17 MED FILL — JARDIANCE 25 MG TABLET: 25 | 90 days supply | Qty: 90 | Fill #1

## 2020-03-23 ENCOUNTER — Other Ambulatory Visit: Payer: Self-pay

## 2020-03-23 ENCOUNTER — Ambulatory Visit: Payer: 59 | Admitting: Podiatry

## 2020-03-23 DIAGNOSIS — M79676 Pain in unspecified toe(s): Secondary | ICD-10-CM

## 2020-03-23 DIAGNOSIS — L989 Disorder of the skin and subcutaneous tissue, unspecified: Secondary | ICD-10-CM | POA: Diagnosis not present

## 2020-03-23 DIAGNOSIS — L97522 Non-pressure chronic ulcer of other part of left foot with fat layer exposed: Secondary | ICD-10-CM | POA: Diagnosis not present

## 2020-03-23 DIAGNOSIS — B351 Tinea unguium: Secondary | ICD-10-CM

## 2020-03-23 DIAGNOSIS — E0843 Diabetes mellitus due to underlying condition with diabetic autonomic (poly)neuropathy: Secondary | ICD-10-CM

## 2020-03-23 DIAGNOSIS — M79675 Pain in left toe(s): Secondary | ICD-10-CM

## 2020-03-23 DIAGNOSIS — M79674 Pain in right toe(s): Secondary | ICD-10-CM

## 2020-03-23 NOTE — Progress Notes (Signed)
   Subjective:  54 y.o. male with PMHx of diabetes mellitus presenting to the office today for multiple complaints.  Patient states that he ripped the skin off of his left great toe.  He also states that he is developing some blistering and calluses to the bilateral feet.  He also complains of thickened and dystrophic nails to the bilateral feet.  He presents today for routine diabetic foot care   Past Medical History:  Diagnosis Date  . Chest pain   . Diabetes mellitus   . Gout   . Hyperlipidemia   . Hypertension   . Obesity       Objective/Physical Exam General: The patient is alert and oriented x3 in no acute distress.  Dermatology:  Wound #1 noted to the distal tip of the left hallux measuring approximately 0.5 x 0.5 x 0.1 cm (LxWxD).   To the noted ulceration(s), there is no eschar. There is a moderate amount of slough, fibrin, and necrotic tissue noted. Granulation tissue and wound base is red. There is a minimal amount of serosanguineous drainage noted. There is no exposed bone muscle-tendon ligament or joint. There is no malodor. Periwound integrity is intact. Skin is warm, dry and supple bilateral lower extremities.  Hyperkeratotic dystrophic nails also noted 1-5 bilateral.  There is also some hyperkeratotic preulcerative callus tissue noted to the bilateral toes.  Vascular: Palpable pedal pulses bilaterally. No edema or erythema noted. Capillary refill within normal limits.  Neurological: Epicritic and protective threshold diminished bilaterally.   Musculoskeletal Exam: Range of motion within normal limits to all pedal and ankle joints bilateral. Muscle strength 5/5 in all groups bilateral.   Assessment: 1.  Ulcer left great toe secondary to diabetes mellitus 2. diabetes mellitus w/ peripheral neuropathy 3.  Pain due to onychomycosis of toenails bilateral 4.  Preulcerative callus lesions bilateral   Plan of Care:  1. Patient was evaluated. 2. medically necessary  excisional debridement including subcutaneous tissue was performed using a tissue nipper and a chisel blade. Excisional debridement of all the necrotic nonviable tissue down to healthy bleeding viable tissue was performed with post-debridement measurements same as pre-. 3. the wound was cleansed and dry sterile dressing applied. 4.  Mechanical debridement of nails 1-5 bilateral was performed using a nail nipper without incident or bleeding  5.  Excisional debridement of the hyperkeratotic callus tissue was performed using a tissue nipper without incident or bleeding  6.  Patient is to return to clinic in 2 weeks.   Felecia Shelling, DPM [facility]  Dr. Felecia Shelling, DPM    [address]                                        [place], [place] [address]                Office [phone]  Fax [phone]

## 2020-03-27 MED FILL — LISINOPRIL 10 MG TABS: 10 | 90 days supply | Qty: 90 | Fill #2

## 2020-04-06 ENCOUNTER — Other Ambulatory Visit (HOSPITAL_COMMUNITY): Payer: Self-pay | Admitting: Internal Medicine

## 2020-04-06 MED FILL — METFORMIN HCL ER 500 MG TB2: 500 | 90 days supply | Qty: 360 | Fill #0

## 2020-04-29 ENCOUNTER — Other Ambulatory Visit (HOSPITAL_COMMUNITY): Payer: Self-pay | Admitting: Internal Medicine

## 2020-04-29 MED FILL — TADALAFIL 10 MG TABS: 10 | 30 days supply | Qty: 6 | Fill #0

## 2020-05-12 ENCOUNTER — Other Ambulatory Visit (HOSPITAL_COMMUNITY): Payer: Self-pay | Admitting: Internal Medicine

## 2020-05-12 MED FILL — TRESIBA FLEXTOUCH 200 UNITS: 200 | 90 days supply | Qty: 72 | Fill #0

## 2020-05-12 MED FILL — UNIFINE PENTIPS 6MM 31G: 31G X 6 MM | 90 days supply | Qty: 100 | Fill #0

## 2020-05-17 ENCOUNTER — Encounter (HOSPITAL_BASED_OUTPATIENT_CLINIC_OR_DEPARTMENT_OTHER): Payer: Self-pay | Admitting: Emergency Medicine

## 2020-05-17 ENCOUNTER — Other Ambulatory Visit: Payer: Self-pay

## 2020-05-17 ENCOUNTER — Emergency Department (HOSPITAL_BASED_OUTPATIENT_CLINIC_OR_DEPARTMENT_OTHER)
Admission: EM | Admit: 2020-05-17 | Discharge: 2020-05-17 | Disposition: A | Discharge status: Home / Self Care | Payer: 59 | Attending: Emergency Medicine | Admitting: Emergency Medicine

## 2020-05-17 DIAGNOSIS — Z79899 Other long term (current) drug therapy: Secondary | ICD-10-CM | POA: Diagnosis not present

## 2020-05-17 DIAGNOSIS — Z7984 Long term (current) use of oral hypoglycemic drugs: Secondary | ICD-10-CM | POA: Diagnosis not present

## 2020-05-17 DIAGNOSIS — Z7982 Long term (current) use of aspirin: Secondary | ICD-10-CM | POA: Insufficient documentation

## 2020-05-17 DIAGNOSIS — E119 Type 2 diabetes mellitus without complications: Secondary | ICD-10-CM | POA: Insufficient documentation

## 2020-05-17 DIAGNOSIS — Z794 Long term (current) use of insulin: Secondary | ICD-10-CM | POA: Diagnosis not present

## 2020-05-17 DIAGNOSIS — I10 Essential (primary) hypertension: Secondary | ICD-10-CM | POA: Insufficient documentation

## 2020-05-17 DIAGNOSIS — F17220 Nicotine dependence, chewing tobacco, uncomplicated: Secondary | ICD-10-CM | POA: Insufficient documentation

## 2020-05-17 DIAGNOSIS — M25551 Pain in right hip: Secondary | ICD-10-CM | POA: Diagnosis present

## 2020-05-17 DIAGNOSIS — M5431 Sciatica, right side: Secondary | ICD-10-CM | POA: Diagnosis not present

## 2020-05-17 MED ORDER — GABAPENTIN 300 MG PO CAPS: 300.0000 mg | ORAL_CAPSULE | Freq: Three times a day (TID) | ORAL | 0 refills | Status: DC | PRN

## 2020-05-17 MED ORDER — GABAPENTIN 300 MG PO CAPS
300.0000 mg | ORAL_CAPSULE | Freq: Once | ORAL | Status: AC
  Administered 2020-05-17: 300 mg via ORAL
  Filled 2020-05-17: qty 1

## 2020-05-17 NOTE — ED Triage Notes (Signed)
Pt reports pain from R hip radiating into his leg x 24 hours. Hx of sciatica.

## 2020-05-17 NOTE — Discharge Instructions (Addendum)
You were seen in the emergency department for right leg pain.  This is likely sciatica or inflammation of your sciatic nerve.  Please continue to use Aleve 3 times a day with food on your stomach.  We are prescribing you gabapentin which is worked in the past.  Please schedule follow-up with your primary care doctor.  Return to the emergency department for any worsening or concerning symptoms.

## 2020-05-17 NOTE — ED Notes (Signed)
ED Provider at bedside. 

## 2020-05-17 NOTE — ED Provider Notes (Signed)
[facility] EMERGENCY DEPARTMENT Provider Note   CSN: [id number] Arrival date & time: 05/17/20  4536     History Chief Complaint  Patient presents with  . Leg Pain    Martin [name] is a 54 y.o. male.  He is here with a complaint of 24 hours of right leg sciatic pain.  York Spaniel he has had this before.  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  No numbness.  No bowel or bladder incontinence.  No fevers.  Worse with ambulation although still has pain at rest.  The history is provided by the patient.  Leg Pain Location:  Hip, buttock and leg Time since incident:  1 day Injury: no   Hip location:  R hip Buttock location:  R buttock Leg location:  R leg Pain details:    Quality:  Throbbing and sharp   Severity:  Severe   Onset quality:  Gradual   Duration:  1 day   Timing:  Constant   Progression:  Unchanged Chronicity:  Recurrent Relieved by:  Nothing Worsened by:  Bearing weight Ineffective treatments:  NSAIDs Associated symptoms: no back pain, no fever, no muscle weakness, no numbness and no swelling        Past Medical History:  Diagnosis Date  . Chest pain   . Diabetes mellitus   . Gout   . Hyperlipidemia   . Hypertension   . Obesity     Patient Active Problem List   Diagnosis Date Noted  . Bilateral hand swelling 01/31/2018  . Left leg swelling 01/31/2018  . Low grade fever 01/31/2018  . Tooth abscess 01/31/2018  . Type 2 diabetes mellitus (HCC) 10/05/2015  . Hypertension 10/05/2015  . Obesity 10/05/2015  . Chest pain 12/19/2014  . Hypertriglyceridemia 08/20/2012  . Gout 07/25/2012  . Essential hypertension, benign 07/25/2012  . Other and unspecified hyperlipidemia 07/25/2012  . Glaucoma 07/25/2012  . Type II or unspecified type diabetes mellitus without mention of complication, uncontrolled 11/30/2010    Past Surgical History:  Procedure Laterality Date  . UMBILICAL HERNIA REPAIR         Family History  Problem Relation  Age of Onset  . Diabetes Maternal Grandfather   . Kidney disease Other     Social History   Tobacco Use  . Smoking status: Never Smoker  . Smokeless tobacco: Current User    Types: Chew  Vaping Use  . Vaping Use: Never used  Substance Use Topics  . Alcohol use: Yes    Alcohol/week: 0.0 standard drinks    Comment: occ  . Drug use: No    Home Medications Prior to Admission medications   Medication Sig Start Date End Date Taking? Authorizing Provider  aspirin 81 MG tablet Take 81 mg by mouth daily.   Yes [provider]  atorvastatin (LIPITOR) 80 MG tablet Take 80 mg by mouth daily.   Yes [provider]  gabapentin (NEURONTIN) 300 MG capsule Take 1 capsule (300 mg total) by mouth 3 (three) times daily. Patient taking differently: Take 300 mg by mouth 3 (three) times daily as needed. 10/05/13  Yes Nelva Nay, MD  insulin degludec (TRESIBA) 200 UNIT/ML FlexTouch Pen Inject 80 Units into the skin daily.   Yes [provider]  insulin glargine (LANTUS) 100 UNIT/ML injection Inject 100 Units into the skin at bedtime.  08/17/12  Yes Carlus Pavlov, MD  lisinopril (PRINIVIL,ZESTRIL) 10 MG tablet Take 10 mg by mouth daily.   Yes [provider]  metFORMIN (GLUCOPHAGE-XR) 500 MG 24 hr tablet  06/11/18  Yes [provider]  Dulaglutide 1.5 MG/0.5ML SOPN Inject 1.5 mg into the skin once a week.    [provider]  fenofibrate 160 MG tablet Take 160 mg by mouth daily.    [provider]  gentamicin cream (GARAMYCIN) 0.1 % Apply 1 application topically 3 (three) times daily. 07/19/17   Felecia Shelling, DPM  metoCLOPramide (REGLAN) 10 MG tablet Take 1 tablet (10 mg total) by mouth every 6 (six) hours as needed for nausea or vomiting. 04/13/19   Molpus, John, MD  omega-3 acid ethyl esters (LOVAZA) 1 G capsule TAKE 1 CAPSULE BY MOUTH 2 TIMES DAILY WITH MEALS 02/06/14   Carlus Pavlov, MD  ondansetron (ZOFRAN ODT) 8 MG disintegrating  tablet Take 1 tablet (8 mg total) by mouth every 8 (eight) hours as needed for nausea or vomiting. 04/13/19   Molpus, Jonny Ruiz, MD  [facility] VERIO test strip USE AS INSTRUCTED 2-3 X A DAY. 08/07/13   Carlus Pavlov, MD  tadalafil (CIALIS) 10 MG tablet  04/26/18   [provider]    Allergies    Patient has no known allergies.  Review of Systems   Review of Systems  Constitutional: Negative for fever.  HENT: Negative for sore throat.   Eyes: Negative for visual disturbance.  Respiratory: Negative for shortness of breath.   Cardiovascular: Negative for chest pain and leg swelling.  Gastrointestinal: Negative for abdominal pain.  Genitourinary: Negative for dysuria.  Musculoskeletal: Negative for back pain.  Skin: Negative for rash and wound.  Neurological: Negative for weakness and numbness.    Physical Exam Updated Vital Signs BP (!) 153/80 (BP Location: Right Arm)   Pulse 82   Temp 98.1 F (36.7 C) (Oral)   Resp 20   Ht 5\' 11"  (1.803 m)   Wt 109.8 kg   SpO2 98%   BMI 33.75 kg/m   Physical Exam Vitals and nursing note reviewed.  Constitutional:      Appearance: He is well-developed and well-nourished.  HENT:     Head: Normocephalic and atraumatic.  Eyes:     Conjunctiva/sclera: Conjunctivae normal.  Pulmonary:     Effort: Pulmonary effort is normal.  Musculoskeletal:        General: No tenderness or deformity.     Cervical back: Neck supple.     Right lower leg: No edema.     Left lower leg: No edema.     Comments: No midline back pain.  Does have some pain at the right sciatic notch.  Diffuse pain down right leg.  Full range of motion at hip knee and ankle.  Motor and sensation distal intact.  Distal pulses intact.  Skin:    General: Skin is warm and dry.     Capillary Refill: Capillary refill takes less than 2 seconds.  Neurological:     General: No focal deficit present.     Mental Status: He is alert.     GCS: GCS eye subscore is 4. GCS verbal subscore  is 5. GCS motor subscore is 6.     Sensory: No sensory deficit.     Motor: No weakness.  Psychiatric:        Mood and Affect: Mood and affect normal.     ED Results / Procedures / Treatments   Labs (all labs ordered are listed, but only abnormal results are displayed) Labs Reviewed - No data to display  EKG None  Radiology No results found.  Procedures Procedures (including critical care time)  Medications Ordered in ED Medications - No data to display  ED Course  I have reviewed the triage vital signs and the nursing notes.  Pertinent labs & imaging results that were available during my care of the patient were reviewed by me and considered in my medical decision making (see chart for details).    MDM Rules/Calculators/A&P                         54 year old male here with recurrent leg pain.  History of sciatica and feels same.  No trauma.  No other back pain red flags.  He said he had good improvement with gabapentin in the past.  I think that is a reasonable approach.  He is already taking NSAIDs.  Diabetic so I would not give him steroids at this time.  Recommended close follow-up with PCP.  Return instructions discussed.  Final Clinical Impression(s) / ED Diagnoses Final diagnoses:  Sciatica of right side    Rx / DC Orders ED Discharge Orders    None       Terrilee Files, MD 05/18/20 1359

## 2020-05-20 ENCOUNTER — Other Ambulatory Visit (HOSPITAL_COMMUNITY): Payer: Self-pay | Admitting: Orthopedic Surgery

## 2020-05-20 DIAGNOSIS — M545 Low back pain, unspecified: Secondary | ICD-10-CM | POA: Diagnosis not present

## 2020-05-20 MED FILL — tiZANidine HCL 4 MG TABS: 4 | 10 days supply | Qty: 40 | Fill #0

## 2020-05-20 MED FILL — MELOXICAM 15 MG TABLET: 15 | 30 days supply | Qty: 30 | Fill #0

## 2020-05-22 ENCOUNTER — Other Ambulatory Visit (HOSPITAL_COMMUNITY): Payer: Self-pay | Admitting: Orthopedic Surgery

## 2020-05-22 MED FILL — CYCLOBENZAPRINE HCL 5 MG TA: 5 | 10 days supply | Qty: 30 | Fill #0

## 2020-05-22 MED FILL — traMADol HCL 50 MG TABS: 50 | 10 days supply | Qty: 30 | Fill #0

## 2020-06-01 ENCOUNTER — Other Ambulatory Visit (HOSPITAL_COMMUNITY): Payer: Self-pay | Admitting: Physician Assistant

## 2020-06-01 MED FILL — CYCLOBENZAPRINE HCL 5 MG TA: 5 | 10 days supply | Qty: 20 | Fill #0

## 2020-06-01 MED FILL — tiZANidine HCL 4 MG TABS: 4 | 7 days supply | Qty: 40 | Fill #0

## 2020-06-01 MED FILL — traMADol HCL 50 MG TABS: 50 | 10 days supply | Qty: 30 | Fill #0

## 2020-06-02 DIAGNOSIS — I10 Essential (primary) hypertension: Secondary | ICD-10-CM | POA: Diagnosis not present

## 2020-06-02 DIAGNOSIS — R609 Edema, unspecified: Secondary | ICD-10-CM | POA: Diagnosis not present

## 2020-06-02 DIAGNOSIS — M79604 Pain in right leg: Secondary | ICD-10-CM | POA: Diagnosis not present

## 2020-06-03 ENCOUNTER — Other Ambulatory Visit (HOSPITAL_COMMUNITY): Payer: Self-pay | Admitting: Family Medicine

## 2020-06-03 MED FILL — FUROSEMIDE 20 MG TABS: 20 | 4 days supply | Qty: 4 | Fill #0

## 2020-06-04 ENCOUNTER — Other Ambulatory Visit (HOSPITAL_COMMUNITY): Payer: Self-pay | Admitting: Internal Medicine

## 2020-06-04 MED FILL — ATORVASTATIN 80 MG TABLET: 80 | 90 days supply | Qty: 90 | Fill #0

## 2020-07-06 ENCOUNTER — Encounter: Payer: Self-pay | Admitting: Physical Therapy

## 2020-07-06 ENCOUNTER — Ambulatory Visit: Payer: 59 | Attending: Orthopaedic Surgery | Admitting: Physical Therapy

## 2020-07-06 ENCOUNTER — Other Ambulatory Visit: Payer: Self-pay

## 2020-07-06 DIAGNOSIS — M5441 Lumbago with sciatica, right side: Secondary | ICD-10-CM | POA: Insufficient documentation

## 2020-07-06 NOTE — Therapy (Signed)
[facility] [address] [place], [place], [address] Phone: [phone]   Fax:  [phone]  Physical Therapy Evaluation  Patient Details  Name: Martin [name] MRN: [id number] Date of Birth: 02-Nov-1965 Referring Provider (PT): Marcene Corning MD   Encounter Date: 07/06/2020   PT End of Session - 07/06/20 1401    Visit Number 1    Number of Visits 12    Date for PT Re-Evaluation 08/17/20    Authorization Type FOTO.    PT Start Time 0107    PT Stop Time 0211    PT Time Calculation (min) 64 min    Activity Tolerance Patient tolerated treatment well    Behavior During Therapy [facility] for tasks assessed/performed           Past Medical History:  Diagnosis Date  . Chest pain   . Diabetes mellitus   . Gout   . Hyperlipidemia   . Hypertension   . Obesity     Past Surgical History:  Procedure Laterality Date  . UMBILICAL HERNIA REPAIR      There were no vitals filed for this visit.    Subjective Assessment - 07/06/20 1338    Subjective COVID-19 screen performed prior to patient entering clinic.  The patient presents to the clinic today with c/o right LE pain due to sciatica that has been ongoing for about 7 weeks.  The reports he has had sciatica in the past but it usually went away in about a week.  he was in excuriating pain the first couple of weeks.  He is improved now but still reports at time high levels and cannot perform work duties nearly as well as before the onset of pain.  He reports right buttock pain and pain now below his knee in region of calf.  Rest decreases pain and be up for long periods of time increases his pain.    Pertinent History DM, HTN, Gout, hernia repair.    How long can you stand comfortably? Varies.    How long can you walk comfortably? Varies.    Patient Stated Goals Get rid of pain.    Currently in Pain? Yes    Pain Score 4     Pain Location Back    Pain Orientation Right    Pain Descriptors /  Indicators Sore;Throbbing    Pain Type Acute pain    Pain Onset More than a month ago    Pain Frequency Constant    Aggravating Factors  See above.    Pain Relieving Factors See above.              [facility] PT Assessment - 07/06/20 0001      Assessment   Medical Diagnosis Low back pain with right leg radiculopathy.    Referring Provider (PT) Marcene Corning MD    Onset Date/Surgical Date --   12/21.     Precautions   Precautions None      Restrictions   Weight Bearing Restrictions No      Balance Screen   Has the patient fallen in the past 6 months No    Has the patient had a decrease in activity level because of a fear of falling?  No    Is the patient reluctant to leave their home because of a fear of falling?  No      Home [employer] information [employer] manager residence      Prior Function   Level of Independence  Independent      Observation/Other Assessments   Focus on Therapeutic Outcomes (FOTO)  Complete.      Posture/Postural Control   Posture/Postural Control Postural limitations    Postural Limitations Rounded Shoulders;Forward head;Decreased lumbar lordosis      Deep Tendon Reflexes   DTR Assessment Site Patella;Achilles    Patella DTR 1+    Achilles DTR 1+      ROM / Strength   AROM / PROM / Strength AROM;Strength      AROM   Overall AROM Comments 25% active lumbar flexion deficit and extension to 15 degrees.      Strength   Overall Strength Comments Normal LE strength.      Palpation   Palpation comment Some tenderness in right buttock region and pain reported in area of right calf.      Special Tests   Other special tests Equal leg length.  (-) SLR and FABER testing.      Ambulation/Gait   Gait Comments Slow and cautious due to pain.                      Objective measurements completed on examination: See above findings.       [facility] Adult PT Treatment/Exercise - 07/06/20 0001      Modalities   Modalities  Traction;Moist Heat;Electrical Stimulation      Moist Heat Therapy   Number Minutes Moist Heat 15 Minutes    Moist Heat Location Lumbar Spine      Electrical Stimulation   Electrical Stimulation Location RT LB/Buttock.    Electrical Stimulation Action Pre-mod.    Electrical Stimulation Parameters 80-150 Hz x 20 minutes.    Electrical Stimulation Goals Pain      Traction   Type of Traction Lumbar    Min (lbs) 5    Max (lbs) 80    Hold Time 99    Rest Time 5    Time 15                       PT Long Term Goals - 07/06/20 1420      PT LONG TERM GOAL #1   Title Independent with a HEP.    Time 6    Period Weeks    Status New      PT LONG TERM GOAL #2   Title Perform ADL's with pain not > 3/10.    Time 6    Period Weeks    Status New      PT LONG TERM GOAL #3   Title Eliminate right LE symptoms.    Time 6    Period Weeks    Status New                  Plan - 07/06/20 1402    Clinical Impression Statement The patient presents to [facility] with c/o right LE pain due to sciatica that came one about seven weeks ago.  He has improved but reports fucntionally he is not able to perform ADL's/work activites like he was.  He has pain in his right buttock region and in his right calf.  His LE reflexes are diminished.  Special testing is negative.  His gait is slow and cautious due to pain.Patient will benefit from skilled physical therapy intervention to address pain and deficits.    Personal Factors and Comorbidities Other;Comorbidity 1    Comorbidities DM, HTN, Gout, hernia repair.    Examination-Activity Limitations  Other;Locomotion Level;Stand    Examination-Participation Restrictions Other    Stability/Clinical Decision Making Stable/Uncomplicated    Clinical Decision Making Low    Rehab Potential Excellent    PT Frequency 2x / week    PT Duration 6 weeks    PT Treatment/Interventions ADLs/Self Care Home Management;Cryotherapy;Electrical  Stimulation;Ultrasound;Traction;Moist Heat;Functional mobility training;Therapeutic activities;Therapeutic exercise;Manual techniques;Patient/family education;Passive range of motion;Dry needling;Spinal Manipulations    PT Next Visit Plan Core exercise progression to include back extension machine.  Modalities PRN.  Can progress patient with traction to 100#.    Consulted and Agree with Plan of Care Patient           Patient will benefit from skilled therapeutic intervention in order to improve the following deficits and impairments:  Pain,Postural dysfunction,Decreased activity tolerance  Visit Diagnosis: Acute right-sided low back pain with right-sided sciatica - Plan: PT plan of care cert/re-cert     Problem List Patient Active Problem List   Diagnosis Date Noted  . Bilateral hand swelling 01/31/2018  . Left leg swelling 01/31/2018  . Low grade fever 01/31/2018  . Tooth abscess 01/31/2018  . Type 2 diabetes mellitus (HCC) 10/05/2015  . Hypertension 10/05/2015  . Obesity 10/05/2015  . Chest pain 12/19/2014  . Hypertriglyceridemia 08/20/2012  . Gout 07/25/2012  . Essential hypertension, benign 07/25/2012  . Other and unspecified hyperlipidemia 07/25/2012  . Glaucoma 07/25/2012  . Type II or unspecified type diabetes mellitus without mention of complication, uncontrolled 11/30/2010    Rabab Currington, [place] MPT 07/06/2020, 2:27 PM  [facility] [address] [place], [place], [address] Phone: [phone]   Fax:  [phone]  Name: Martin [name] MRN: [id number] Date of Birth: Nov 03, 1965

## 2020-07-09 ENCOUNTER — Ambulatory Visit: Payer: 59 | Attending: Orthopaedic Surgery | Admitting: *Deleted

## 2020-07-09 ENCOUNTER — Other Ambulatory Visit: Payer: Self-pay

## 2020-07-09 DIAGNOSIS — M5441 Lumbago with sciatica, right side: Secondary | ICD-10-CM | POA: Diagnosis not present

## 2020-07-09 NOTE — Therapy (Signed)
[facility] [address] [place], [place], [address] Phone: [phone]   Fax:  [phone]  Physical Therapy Treatment  Patient Details  Name: Martin [name] MRN: [id number] Date of Birth: [birth year] Referring Provider (PT): Marcene Corning MD   Encounter Date: 07/09/2020   PT End of Session - 07/09/20 1345    Visit Number 2    Number of Visits 12    Date for PT Re-Evaluation 08/17/20    Authorization Type FOTO.    PT Start Time 1348    PT Stop Time 1425    PT Time Calculation (min) 37 min           Past Medical History:  Diagnosis Date  . Chest pain   . Diabetes mellitus   . Gout   . Hyperlipidemia   . Hypertension   . Obesity     Past Surgical History:  Procedure Laterality Date  . UMBILICAL HERNIA REPAIR      There were no vitals filed for this visit.   Subjective Assessment - 07/09/20 1340    Subjective COVID-19 screen performed prior to patient entering clinic.  The patient presents to the clinic today with c/o right LE pain due to sciatica that has been ongoing for about 7 weeks.  today his paain is    Pertinent History DM, HTN, Gout, hernia repair.    How long can you stand comfortably? Varies.    How long can you walk comfortably? Varies.    Patient Stated Goals Get rid of pain.                             [facility] Adult PT Treatment/Exercise - 07/09/20 0001      Exercises   Exercises Lumbar;Knee/Hip      Lumbar Exercises: Stretches   Piriformis Stretch Right;5 reps;10 seconds   in supine  and in sitting   Other Lumbar Stretch Exercise RT lateral hip stretch x 5 hold 10 secs      Modalities   Modalities Traction;Moist Heat;Electrical Stimulation      Traction   Type of Traction Lumbar    Min (lbs) 5    Max (lbs) 90    Hold Time 99    Rest Time 5    Time 15                       PT Long Term Goals - 07/06/20 1420      PT LONG TERM GOAL #1   Title Independent  with a HEP.    Time 6    Period Weeks    Status New      PT LONG TERM GOAL #2   Title Perform ADL's with pain not > 3/10.    Time 6    Period Weeks    Status New      PT LONG TERM GOAL #3   Title Eliminate right LE symptoms.    Time 6    Period Weeks    Status New                 Plan - 07/09/20 1434    Clinical Impression Statement Pt  instructed in RT hip stretching for lateral aspect and then piriformis stretching in supine and in sitting. Hand out given for HEP. Pt denied modalities and requsted just Pelvic traction. We discussed gym equipment/exs with traction for next Rx. Pt did well  with 90# traction pull today    Personal Factors and Comorbidities Other;Comorbidity 1    Comorbidities DM, HTN, Gout, hernia repair.    Stability/Clinical Decision Making Stable/Uncomplicated    Rehab Potential Excellent    PT Frequency 2x / week    PT Treatment/Interventions ADLs/Self Care Home Management;Cryotherapy;Electrical Stimulation;Ultrasound;Traction;Moist Heat;Functional mobility training;Therapeutic activities;Therapeutic exercise;Manual techniques;Patient/family education;Passive range of motion;Dry needling;Spinal Manipulations    PT Next Visit Plan Core exercise progression to include back extension machine.  Modalities PRN.  Can progress patient with traction to 100#.    Consulted and Agree with Plan of Care Patient           Patient will benefit from skilled therapeutic intervention in order to improve the following deficits and impairments:  Pain,Postural dysfunction,Decreased activity tolerance  Visit Diagnosis: Acute right-sided low back pain with right-sided sciatica     Problem List Patient Active Problem List   Diagnosis Date Noted  . Bilateral hand swelling 01/31/2018  . Left leg swelling 01/31/2018  . Low grade fever 01/31/2018  . Tooth abscess 01/31/2018  . Type 2 diabetes mellitus (HCC) 10/05/2015  . Hypertension 10/05/2015  . Obesity 10/05/2015   . Chest pain 12/19/2014  . Hypertriglyceridemia 08/20/2012  . Gout 07/25/2012  . Essential hypertension, benign 07/25/2012  . Other and unspecified hyperlipidemia 07/25/2012  . Glaucoma 07/25/2012  . Type II or unspecified type diabetes mellitus without mention of complication, uncontrolled 11/30/2010    Martin [name],Martin [name], PTA 07/09/2020, 2:48 PM  [facility] [address] [place], [place], [address] Phone: [phone]   Fax:  [phone]  Name: Martin [name] MRN: [id number] Date of Birth: 08/06/1965

## 2020-07-14 ENCOUNTER — Ambulatory Visit: Payer: 59 | Admitting: Physical Therapy

## 2020-07-14 ENCOUNTER — Other Ambulatory Visit: Payer: Self-pay

## 2020-07-14 DIAGNOSIS — M5441 Lumbago with sciatica, right side: Secondary | ICD-10-CM

## 2020-07-14 NOTE — Therapy (Addendum)
[facility] [place], [place], [address] Phone: [phone]   Fax:  [phone]  Physical Therapy Treatment  Patient Details  Name: Martin [name] MRN: [id number] Date of Birth: [birth year] Referring Provider (PT): Melrose Nakayama MD   Encounter Date: 07/14/2020   PT End of Session - 07/14/20 1306     Visit Number 3    Number of Visits 12    Date for PT Re-Evaluation 08/17/20    Authorization Type FOTO.    PT Start Time 0100    PT Stop Time 0151    PT Time Calculation (min) 51 min    Activity Tolerance Patient tolerated treatment well    Behavior During Therapy WFL for tasks assessed/performed             Past Medical History:  Diagnosis Date   Chest pain    Diabetes mellitus    Gout    Hyperlipidemia    Hypertension    Obesity     Past Surgical History:  Procedure Laterality Date   UMBILICAL HERNIA REPAIR      There were no vitals filed for this visit.   Subjective Assessment - 07/14/20 1311     Subjective COVID-19 screen performed prior to patient entering clinic.    Woked a long shift.  Back doing better.    Pertinent History DM, HTN, Gout, hernia repair.    How long can you stand comfortably? Varies.    Patient Stated Goals Get rid of pain.    Currently in Pain? Yes    Pain Score 3     Pain Location Back    Pain Orientation Right    Pain Descriptors / Indicators Sore;Throbbing    Pain Type Acute pain    Pain Onset More than a month ago                               [facility] Adult PT Treatment/Exercise - 07/14/20 0001       Exercises   Exercises Knee/Hip;Lumbar      Lumbar Exercises: Aerobic   Nustep Level 4 x 15 minutes      Lumbar Exercises: Machines for Strengthening   Cybex Lumbar Extension 90# x 4 minutes.    Other Lumbar Machine Exercise Ab machine at 90# x 4 minutes.      Traction   Type of Traction Lumbar    Min (lbs) 5    Max (lbs) 100    Hold Time 99     Rest Time 5    Time 15                         PT Long Term Goals - 07/06/20 1420       PT LONG TERM GOAL #1   Title Independent with a HEP.    Time 6    Period Weeks    Status New      PT LONG TERM GOAL #2   Title Perform ADL's with pain not > 3/10.    Time 6    Period Weeks    Status New      PT LONG TERM GOAL #3   Title Eliminate right LE symptoms.    Time 6    Period Weeks    Status New                   Plan -  07/14/20 1335     Clinical Impression Statement Patient is doing very well with PT and did great with ther ex that included resisted back extension and abdominal crunches today.  No complaint with increase in weight of traction.  Patient is highly motivated.    Personal Factors and Comorbidities Other;Comorbidity 1    Comorbidities DM, HTN, Gout, hernia repair.    Examination-Activity Limitations Other;Locomotion Level;Stand    Examination-Participation Restrictions Other    Stability/Clinical Decision Making Stable/Uncomplicated    Rehab Potential Excellent    PT Frequency 2x / week    PT Duration 6 weeks    PT Treatment/Interventions ADLs/Self Care Home Management;Cryotherapy;Electrical Stimulation;Ultrasound;Traction;Moist Heat;Functional mobility training;Therapeutic activities;Therapeutic exercise;Manual techniques;Patient/family education;Passive range of motion;Dry needling;Spinal Manipulations    PT Next Visit Plan Core exercise progression to include back extension machine.  Modalities PRN.  Can progress patient with traction to 100#.    Consulted and Agree with Plan of Care Patient             Patient will benefit from skilled therapeutic intervention in order to improve the following deficits and impairments:  Pain,Postural dysfunction,Decreased activity tolerance  Visit Diagnosis: Acute right-sided low back pain with right-sided sciatica     Problem List Patient Active Problem List   Diagnosis Date Noted    Bilateral hand swelling 01/31/2018   Left leg swelling 01/31/2018   Low grade fever 01/31/2018   Tooth abscess 01/31/2018   Type 2 diabetes mellitus ([place]) 10/05/2015   Hypertension 10/05/2015   Obesity 10/05/2015   Chest pain 12/19/2014   Hypertriglyceridemia 08/20/2012   Gout 07/25/2012   Essential hypertension, benign 07/25/2012   Other and unspecified hyperlipidemia 07/25/2012   Glaucoma 07/25/2012   Type II or unspecified type diabetes mellitus without mention of complication, uncontrolled 11/30/2010    Monika Chestang, Martin [name] 07/14/2020, 1:58 PM  [facility] [address] [place], [place], [address] Phone: [phone]   Fax:  [phone]  Name: Martin [name] MRN: [id number] Date of Birth: [birth year]  PHYSICAL THERAPY DISCHARGE SUMMARY  Visits from Start of Care: 3.  Current functional level related to goals / functional outcomes: See above.   Remaining deficits: See below.   Education / Equipment: HEP.   Patient agrees to discharge. Patient goals were not met. Patient is being discharged due to not returning since the last visit.    Martin [name] [name]

## 2020-07-15 DIAGNOSIS — H52223 Regular astigmatism, bilateral: Secondary | ICD-10-CM | POA: Diagnosis not present

## 2020-07-15 DIAGNOSIS — H5211 Myopia, right eye: Secondary | ICD-10-CM | POA: Diagnosis not present

## 2020-07-15 DIAGNOSIS — E119 Type 2 diabetes mellitus without complications: Secondary | ICD-10-CM | POA: Diagnosis not present

## 2020-07-15 DIAGNOSIS — H35033 Hypertensive retinopathy, bilateral: Secondary | ICD-10-CM | POA: Diagnosis not present

## 2020-07-15 DIAGNOSIS — H11153 Pinguecula, bilateral: Secondary | ICD-10-CM | POA: Diagnosis not present

## 2020-07-15 DIAGNOSIS — I10 Essential (primary) hypertension: Secondary | ICD-10-CM | POA: Diagnosis not present

## 2020-07-15 DIAGNOSIS — H524 Presbyopia: Secondary | ICD-10-CM | POA: Diagnosis not present

## 2020-07-16 ENCOUNTER — Encounter: Payer: 59 | Admitting: *Deleted

## 2020-08-17 ENCOUNTER — Other Ambulatory Visit (HOSPITAL_COMMUNITY): Payer: Self-pay | Admitting: Internal Medicine

## 2020-08-17 DIAGNOSIS — E1165 Type 2 diabetes mellitus with hyperglycemia: Secondary | ICD-10-CM | POA: Diagnosis not present

## 2020-08-17 DIAGNOSIS — E781 Pure hyperglyceridemia: Secondary | ICD-10-CM | POA: Diagnosis not present

## 2020-08-17 DIAGNOSIS — N529 Male erectile dysfunction, unspecified: Secondary | ICD-10-CM | POA: Diagnosis not present

## 2020-08-17 DIAGNOSIS — Z794 Long term (current) use of insulin: Secondary | ICD-10-CM | POA: Diagnosis not present

## 2020-08-21 ENCOUNTER — Other Ambulatory Visit (HOSPITAL_COMMUNITY): Payer: Self-pay | Admitting: Internal Medicine

## 2020-08-28 ENCOUNTER — Other Ambulatory Visit (HOSPITAL_COMMUNITY): Payer: Self-pay | Admitting: Internal Medicine

## 2020-08-28 MED FILL — METFORMIN HCL ER 500 MG TB2: 500 | 90 days supply | Qty: 360 | Fill #0

## 2020-08-28 MED FILL — UNIFINE PENTIPS 6MM 31G: 31G X 6 MM | 90 days supply | Qty: 200 | Fill #0

## 2020-08-28 MED FILL — HUMALOG 100 UNITS/ML KWIKPE: 100 | 90 days supply | Qty: 9 | Fill #0

## 2020-09-04 ENCOUNTER — Other Ambulatory Visit (HOSPITAL_COMMUNITY): Payer: Self-pay | Admitting: Internal Medicine

## 2020-09-10 ENCOUNTER — Other Ambulatory Visit (HOSPITAL_COMMUNITY): Payer: Self-pay

## 2020-09-10 MED FILL — Sildenafil Citrate Tab 50 MG: ORAL | 30 days supply | Qty: 6 | Fill #0 | Status: AC

## 2020-09-11 ENCOUNTER — Other Ambulatory Visit (HOSPITAL_COMMUNITY): Payer: Self-pay

## 2020-09-14 ENCOUNTER — Ambulatory Visit: Payer: 59 | Admitting: Podiatry

## 2020-09-16 ENCOUNTER — Other Ambulatory Visit (HOSPITAL_COMMUNITY): Payer: Self-pay

## 2020-09-16 MED FILL — Insulin Degludec Soln Pen-Injector 200 Unit/ML: SUBCUTANEOUS | 90 days supply | Qty: 72 | Fill #0 | Status: AC

## 2020-09-17 ENCOUNTER — Other Ambulatory Visit (HOSPITAL_COMMUNITY): Payer: Self-pay

## 2020-09-17 MED ORDER — JARDIANCE 25 MG PO TABS
25.0000 mg | ORAL_TABLET | Freq: Every day | ORAL | 3 refills | Status: DC
  Filled 2020-09-17: qty 90, 90d supply, fill #0

## 2020-09-18 ENCOUNTER — Other Ambulatory Visit (HOSPITAL_COMMUNITY): Payer: Self-pay

## 2020-09-22 ENCOUNTER — Other Ambulatory Visit (HOSPITAL_COMMUNITY): Payer: Self-pay

## 2020-10-20 ENCOUNTER — Other Ambulatory Visit (HOSPITAL_COMMUNITY): Payer: Self-pay | Admitting: Internal Medicine

## 2020-10-20 ENCOUNTER — Other Ambulatory Visit (HOSPITAL_COMMUNITY): Payer: Self-pay

## 2020-10-21 ENCOUNTER — Other Ambulatory Visit (HOSPITAL_COMMUNITY): Payer: Self-pay

## 2020-10-21 ENCOUNTER — Other Ambulatory Visit (HOSPITAL_COMMUNITY): Payer: Self-pay | Admitting: Internal Medicine

## 2020-10-22 ENCOUNTER — Other Ambulatory Visit (HOSPITAL_COMMUNITY): Payer: Self-pay

## 2020-10-22 MED ORDER — ATORVASTATIN CALCIUM 80 MG PO TABS
80.0000 mg | ORAL_TABLET | Freq: Every evening | ORAL | 3 refills | Status: DC
  Filled 2020-10-22: qty 90, 90d supply, fill #0
  Filled 2021-02-22: qty 90, 90d supply, fill #1
  Filled 2021-06-07: qty 90, 90d supply, fill #2

## 2020-11-11 ENCOUNTER — Other Ambulatory Visit (HOSPITAL_COMMUNITY): Payer: Self-pay

## 2020-11-11 MED ORDER — SILDENAFIL CITRATE 50 MG PO TABS
50.0000 mg | ORAL_TABLET | Freq: Every day | ORAL | 11 refills | Status: DC
  Filled 2020-11-11: qty 6, 30d supply, fill #0

## 2020-11-12 ENCOUNTER — Other Ambulatory Visit (HOSPITAL_COMMUNITY): Payer: Self-pay

## 2020-11-13 ENCOUNTER — Other Ambulatory Visit (HOSPITAL_COMMUNITY): Payer: Self-pay

## 2020-12-11 ENCOUNTER — Other Ambulatory Visit (HOSPITAL_COMMUNITY): Payer: Self-pay

## 2020-12-11 DIAGNOSIS — Z7984 Long term (current) use of oral hypoglycemic drugs: Secondary | ICD-10-CM | POA: Diagnosis not present

## 2020-12-11 DIAGNOSIS — E1165 Type 2 diabetes mellitus with hyperglycemia: Secondary | ICD-10-CM | POA: Diagnosis not present

## 2020-12-11 DIAGNOSIS — Z794 Long term (current) use of insulin: Secondary | ICD-10-CM | POA: Diagnosis not present

## 2020-12-11 DIAGNOSIS — N529 Male erectile dysfunction, unspecified: Secondary | ICD-10-CM | POA: Diagnosis not present

## 2020-12-11 MED ORDER — LISINOPRIL 10 MG PO TABS
10.0000 mg | ORAL_TABLET | Freq: Every day | ORAL | 3 refills | Status: DC
  Filled 2020-12-11: qty 90, 90d supply, fill #0

## 2020-12-11 MED ORDER — SILDENAFIL CITRATE 100 MG PO TABS
100.0000 mg | ORAL_TABLET | Freq: Every day | ORAL | 12 refills | Status: DC
  Filled 2020-12-11: qty 6, 30d supply, fill #0

## 2020-12-14 ENCOUNTER — Ambulatory Visit (INDEPENDENT_AMBULATORY_CARE_PROVIDER_SITE_OTHER): Payer: 59

## 2020-12-14 ENCOUNTER — Ambulatory Visit: Payer: 59 | Admitting: Podiatry

## 2020-12-14 ENCOUNTER — Encounter: Payer: Self-pay | Admitting: Podiatry

## 2020-12-14 ENCOUNTER — Other Ambulatory Visit (HOSPITAL_COMMUNITY): Payer: Self-pay

## 2020-12-14 ENCOUNTER — Other Ambulatory Visit: Payer: Self-pay

## 2020-12-14 DIAGNOSIS — L600 Ingrowing nail: Secondary | ICD-10-CM | POA: Diagnosis not present

## 2020-12-14 DIAGNOSIS — L97512 Non-pressure chronic ulcer of other part of right foot with fat layer exposed: Secondary | ICD-10-CM | POA: Diagnosis not present

## 2020-12-14 DIAGNOSIS — E0843 Diabetes mellitus due to underlying condition with diabetic autonomic (poly)neuropathy: Secondary | ICD-10-CM

## 2020-12-14 MED ORDER — GENTAMICIN SULFATE 0.1 % EX CREA
1.0000 "application " | TOPICAL_CREAM | Freq: Three times a day (TID) | CUTANEOUS | 1 refills | Status: DC
  Filled 2020-12-14: qty 30, 10d supply, fill #0

## 2020-12-14 MED ORDER — DOXYCYCLINE HYCLATE 100 MG PO TABS
100.0000 mg | ORAL_TABLET | Freq: Two times a day (BID) | ORAL | 0 refills | Status: DC
  Filled 2020-12-14: qty 20, 10d supply, fill #0

## 2020-12-14 NOTE — Progress Notes (Signed)
   Subjective:  55 y.o. male with PMHx of diabetes mellitus with a new complaint of a blister/ulcer that developed to the RT great toe. Patient states that it developed approximately 2-3 weeks ago. Patient works on his feet all day at [employer] in boots. He currently has not done anything for treatment.    Past Medical History:  Diagnosis Date   Chest pain    Diabetes mellitus    Gout    Hyperlipidemia    Hypertension    Obesity       Objective/Physical Exam General: The patient is alert and oriented x3 in no acute distress.  Dermatology:  Wound #1 noted to the Distal Plantar Tuft of the RT hallux measuring approximately 0.5x0.5x0.2 cm (LxWxD).   To the noted ulceration(s), there is no eschar. There is a moderate amount of slough, fibrin, and necrotic tissue noted. Granulation tissue and wound base is red. There is a minimal amount of serosanguineous drainage noted. There is no exposed bone muscle-tendon ligament or joint. There is no malodor. Periwound integrity is intact. Skin is warm, dry and supple bilateral lower extremities.  Vascular: Palpable pedal pulses bilaterally. No edema or erythema noted. Capillary refill within normal limits.  Neurological: Epicritic and protective threshold diminished bilaterally.   Musculoskeletal Exam: Range of motion within normal limits to all pedal and ankle joints bilateral. Muscle strength 5/5 in all groups bilateral.   Radiographic Exam: Normal osseous mineralization.  No osteolytic process noted.  No concern for osteomyelitis  Assessment: 1. Ulcer RT hallux secondary to diabetes mellitus 2. diabetes mellitus w/ peripheral neuropathy   Plan of Care:  1. Patient was evaluated. Xrays reviewed 2. medically necessary excisional debridement including subcutaneous tissue was performed using a tissue nipper and a chisel blade. Excisional debridement of all the necrotic nonviable tissue down to healthy bleeding viable tissue was performed with  post-debridement measurements same as pre-. 3. the wound was cleansed and dry sterile dressing applied. 4. Cultures taken and sent to pathology 5. Rx doxycycline 100mg  BID #20 6. Rx gentamicin cr. Sent to pharmacy and dressing supplies provided.  7. patient is to return to clinic in 3 weeks.   , DPM [facility]  Dr. Felecia Shelling, DPM    [address] [place], [place]                Office [phone]  Fax [phone]

## 2020-12-16 ENCOUNTER — Telehealth: Payer: Self-pay | Admitting: Podiatry

## 2020-12-16 NOTE — Telephone Encounter (Signed)
Patient is calling because he would like a work note be out the rest of the week until 12/20/2020

## 2020-12-17 ENCOUNTER — Encounter: Payer: Self-pay | Admitting: Podiatry

## 2020-12-18 LAB — WOUND CULTURE
MICRO NUMBER:: 12103524
SPECIMEN QUALITY:: ADEQUATE

## 2020-12-31 ENCOUNTER — Other Ambulatory Visit (HOSPITAL_COMMUNITY): Payer: Self-pay

## 2020-12-31 MED FILL — Metformin HCl Tab ER 24HR 500 MG: ORAL | 90 days supply | Qty: 360 | Fill #0 | Status: AC

## 2021-01-01 ENCOUNTER — Other Ambulatory Visit (HOSPITAL_COMMUNITY): Payer: Self-pay

## 2021-01-01 MED FILL — Insulin Degludec Soln Pen-Injector 200 Unit/ML: SUBCUTANEOUS | 90 days supply | Qty: 72 | Fill #1 | Status: CN

## 2021-01-04 ENCOUNTER — Telehealth: Payer: Self-pay | Admitting: Podiatry

## 2021-01-04 ENCOUNTER — Ambulatory Visit: Payer: 59 | Admitting: Podiatry

## 2021-01-04 ENCOUNTER — Other Ambulatory Visit (HOSPITAL_COMMUNITY): Payer: Self-pay

## 2021-01-04 ENCOUNTER — Other Ambulatory Visit: Payer: Self-pay

## 2021-01-04 DIAGNOSIS — E08621 Diabetes mellitus due to underlying condition with foot ulcer: Secondary | ICD-10-CM

## 2021-01-04 DIAGNOSIS — L97512 Non-pressure chronic ulcer of other part of right foot with fat layer exposed: Secondary | ICD-10-CM | POA: Diagnosis not present

## 2021-01-04 MED ORDER — DOXYCYCLINE HYCLATE 100 MG PO TABS
100.0000 mg | ORAL_TABLET | Freq: Two times a day (BID) | ORAL | 0 refills | Status: DC
Start: 2021-01-04 — End: 2021-01-25
  Filled 2021-01-04: qty 20, 10d supply, fill #0

## 2021-01-04 NOTE — Telephone Encounter (Signed)
Patients wife stated that [name] foot is very swollen and that he couldn't get off work for his appointment this morning. She wanted to know if he could come in this afternoon, he has finished his antibiotics and used the cream as well, it has not gotten any better  Please advise

## 2021-01-04 NOTE — Progress Notes (Signed)
   Subjective:  55 y.o. male with PMHx of diabetes mellitus with a new complaint of a blister/ulcer that developed to the RT great toe. Patient states that it developed approximately 2-3 weeks ago. Patient works on his feet all day at [employer] in boots. He currently has not done anything for treatment.    Past Medical History:  Diagnosis Date   Chest pain    Diabetes mellitus    Gout    Hyperlipidemia    Hypertension    Obesity        Objective/Physical Exam General: The patient is alert and oriented x3 in no acute distress.  Dermatology:  Wound #1 noted to the Distal Plantar Tuft of the RT hallux measuring approximately 0.6 x 0.6 to 0.3 cm (LxWxD).   To the noted ulceration(s), there is no eschar. There is a moderate amount of slough, fibrin, and necrotic tissue noted. Granulation tissue and wound base is red. There is a minimal amount of serosanguineous drainage noted. There is no exposed bone muscle-tendon ligament or joint. There is no malodor. Periwound integrity is intact. Skin is warm, dry and supple bilateral lower extremities.  Vascular: Palpable pedal pulses bilaterally. No edema or erythema noted. Capillary refill within normal limits.  Neurological: Epicritic and protective threshold diminished bilaterally.   Musculoskeletal Exam: Range of motion within normal limits to all pedal and ankle joints bilateral. Muscle strength 5/5 in all groups bilateral.    Assessment: 1. Ulcer RT hallux secondary to diabetes mellitus 2. diabetes mellitus w/ peripheral neuropathy   Plan of Care:  1. Patient was evaluated. Xrays reviewed 2. medically necessary excisional debridement including subcutaneous tissue was performed using a tissue nipper and a chisel blade. Excisional debridement of all the necrotic nonviable tissue down to healthy bleeding viable tissue was performed with post-debridement measurements same as pre-. 3. the wound was cleansed and dry sterile dressing  applied. 4.  Cultures reviewed 5.  Refill Rx doxycycline 100mg  BID #20 6.  Continue gentamicin cream 7.  Offloading felt dancers pads were applied to the insoles of the shoes to offload pressure from the great toes  8.  Patient is to return to clinic in 3 weeks.   , DPM [facility]  Dr. Felecia Shelling, DPM    [address] [place], [place]                Office [phone]  Fax [phone]

## 2021-01-08 ENCOUNTER — Other Ambulatory Visit (HOSPITAL_COMMUNITY): Payer: Self-pay

## 2021-01-08 MED FILL — Insulin Degludec Soln Pen-Injector 200 Unit/ML: SUBCUTANEOUS | 30 days supply | Qty: 24 | Fill #1 | Status: AC

## 2021-01-18 ENCOUNTER — Telehealth: Payer: Self-pay | Admitting: Podiatry

## 2021-01-18 ENCOUNTER — Other Ambulatory Visit: Payer: Self-pay | Admitting: Podiatry

## 2021-01-18 MED ORDER — CLINDAMYCIN HCL 300 MG PO CAPS: 300.0000 mg | ORAL_CAPSULE | Freq: Three times a day (TID) | ORAL | 0 refills | Status: DC

## 2021-01-18 MED ORDER — CLINDAMYCIN HCL 300 MG PO CAPS
300.0000 mg | ORAL_CAPSULE | Freq: Three times a day (TID) | ORAL | 0 refills | Status: DC
  Filled 2021-01-18: qty 30, 10d supply, fill #0

## 2021-01-18 NOTE — Progress Notes (Signed)
Rx cellulitis of toe with diabetic foot infection.

## 2021-01-18 NOTE — Telephone Encounter (Signed)
Pt's wife called stating his entire toe is now red leading up to his foot. He has a hot spot and is struggling to walk. Their son has COVID so they won't be able to be seen until his appt on 8/22. They would like to know what they can do from home until they are able to see you. Please advise.

## 2021-01-18 NOTE — Telephone Encounter (Signed)
Called and left a message for the patient on his cell phone.  Sent in a prescription for clindamycin 300 mg 3 times a day #30.  If he gets worse he needs to get to the emergency department.-Dr. Logan Bores

## 2021-01-19 ENCOUNTER — Other Ambulatory Visit (HOSPITAL_COMMUNITY): Payer: Self-pay

## 2021-01-20 ENCOUNTER — Inpatient Hospital Stay (HOSPITAL_BASED_OUTPATIENT_CLINIC_OR_DEPARTMENT_OTHER)
Admission: EM | Admit: 2021-01-20 | Discharge: 2021-01-25 | DRG: 617 | Disposition: A | Discharge status: Home / Self Care | Payer: 59 | Attending: Family Medicine | Admitting: Family Medicine

## 2021-01-20 ENCOUNTER — Emergency Department (HOSPITAL_BASED_OUTPATIENT_CLINIC_OR_DEPARTMENT_OTHER): Payer: 59 | Admitting: Radiology

## 2021-01-20 ENCOUNTER — Encounter (HOSPITAL_BASED_OUTPATIENT_CLINIC_OR_DEPARTMENT_OTHER): Payer: Self-pay | Admitting: *Deleted

## 2021-01-20 ENCOUNTER — Other Ambulatory Visit: Payer: Self-pay

## 2021-01-20 DIAGNOSIS — Z833 Family history of diabetes mellitus: Secondary | ICD-10-CM | POA: Diagnosis not present

## 2021-01-20 DIAGNOSIS — E785 Hyperlipidemia, unspecified: Secondary | ICD-10-CM | POA: Diagnosis present

## 2021-01-20 DIAGNOSIS — L03031 Cellulitis of right toe: Secondary | ICD-10-CM | POA: Diagnosis present

## 2021-01-20 DIAGNOSIS — L0390 Cellulitis, unspecified: Secondary | ICD-10-CM | POA: Diagnosis not present

## 2021-01-20 DIAGNOSIS — Z7984 Long term (current) use of oral hypoglycemic drugs: Secondary | ICD-10-CM | POA: Diagnosis not present

## 2021-01-20 DIAGNOSIS — E1169 Type 2 diabetes mellitus with other specified complication: Secondary | ICD-10-CM | POA: Diagnosis not present

## 2021-01-20 DIAGNOSIS — S91101A Unspecified open wound of right great toe without damage to nail, initial encounter: Secondary | ICD-10-CM | POA: Diagnosis not present

## 2021-01-20 DIAGNOSIS — L97519 Non-pressure chronic ulcer of other part of right foot with unspecified severity: Secondary | ICD-10-CM | POA: Diagnosis not present

## 2021-01-20 DIAGNOSIS — M86171 Other acute osteomyelitis, right ankle and foot: Secondary | ICD-10-CM | POA: Diagnosis not present

## 2021-01-20 DIAGNOSIS — I10 Essential (primary) hypertension: Secondary | ICD-10-CM | POA: Diagnosis present

## 2021-01-20 DIAGNOSIS — E11621 Type 2 diabetes mellitus with foot ulcer: Secondary | ICD-10-CM | POA: Diagnosis not present

## 2021-01-20 DIAGNOSIS — E11649 Type 2 diabetes mellitus with hypoglycemia without coma: Secondary | ICD-10-CM | POA: Diagnosis not present

## 2021-01-20 DIAGNOSIS — E876 Hypokalemia: Secondary | ICD-10-CM | POA: Diagnosis present

## 2021-01-20 DIAGNOSIS — E669 Obesity, unspecified: Secondary | ICD-10-CM | POA: Diagnosis present

## 2021-01-20 DIAGNOSIS — M7989 Other specified soft tissue disorders: Secondary | ICD-10-CM | POA: Diagnosis not present

## 2021-01-20 DIAGNOSIS — Z89411 Acquired absence of right great toe: Secondary | ICD-10-CM | POA: Diagnosis not present

## 2021-01-20 DIAGNOSIS — M7731 Calcaneal spur, right foot: Secondary | ICD-10-CM | POA: Diagnosis not present

## 2021-01-20 DIAGNOSIS — E119 Type 2 diabetes mellitus without complications: Secondary | ICD-10-CM

## 2021-01-20 DIAGNOSIS — Z20822 Contact with and (suspected) exposure to covid-19: Secondary | ICD-10-CM | POA: Diagnosis not present

## 2021-01-20 DIAGNOSIS — F17220 Nicotine dependence, chewing tobacco, uncomplicated: Secondary | ICD-10-CM | POA: Diagnosis present

## 2021-01-20 DIAGNOSIS — Z794 Long term (current) use of insulin: Secondary | ICD-10-CM | POA: Diagnosis not present

## 2021-01-20 DIAGNOSIS — L97511 Non-pressure chronic ulcer of other part of right foot limited to breakdown of skin: Secondary | ICD-10-CM | POA: Diagnosis not present

## 2021-01-20 DIAGNOSIS — M86671 Other chronic osteomyelitis, right ankle and foot: Secondary | ICD-10-CM | POA: Diagnosis not present

## 2021-01-20 DIAGNOSIS — M869 Osteomyelitis, unspecified: Secondary | ICD-10-CM | POA: Diagnosis present

## 2021-01-20 DIAGNOSIS — I152 Hypertension secondary to endocrine disorders: Secondary | ICD-10-CM | POA: Diagnosis present

## 2021-01-20 DIAGNOSIS — I96 Gangrene, not elsewhere classified: Secondary | ICD-10-CM | POA: Diagnosis not present

## 2021-01-20 DIAGNOSIS — Z7982 Long term (current) use of aspirin: Secondary | ICD-10-CM | POA: Diagnosis not present

## 2021-01-20 DIAGNOSIS — S98111A Complete traumatic amputation of right great toe, initial encounter: Secondary | ICD-10-CM | POA: Diagnosis not present

## 2021-01-20 DIAGNOSIS — E1165 Type 2 diabetes mellitus with hyperglycemia: Secondary | ICD-10-CM

## 2021-01-20 DIAGNOSIS — Z79899 Other long term (current) drug therapy: Secondary | ICD-10-CM | POA: Diagnosis not present

## 2021-01-20 DIAGNOSIS — Z6837 Body mass index (BMI) 37.0-37.9, adult: Secondary | ICD-10-CM | POA: Diagnosis not present

## 2021-01-20 DIAGNOSIS — L97509 Non-pressure chronic ulcer of other part of unspecified foot with unspecified severity: Secondary | ICD-10-CM

## 2021-01-20 DIAGNOSIS — M19071 Primary osteoarthritis, right ankle and foot: Secondary | ICD-10-CM | POA: Diagnosis not present

## 2021-01-20 DIAGNOSIS — Z9889 Other specified postprocedural states: Secondary | ICD-10-CM

## 2021-01-20 LAB — BASIC METABOLIC PANEL
Anion gap: 9 (ref 5–15)
BUN: 16 mg/dL (ref 6–20)
CO2: 25 mmol/L (ref 22–32)
Calcium: 9.8 mg/dL (ref 8.9–10.3)
Chloride: 101 mmol/L (ref 98–111)
Creatinine, Ser: 0.67 mg/dL (ref 0.61–1.24)
GFR, Estimated: >60 mL/min (ref >60)
Glucose, Bld: 156 mg/dL — ABNORMAL HIGH (ref 70–99)
Potassium: 4.5 mmol/L (ref 3.5–5.1)
Sodium: 135 mmol/L (ref 135–145)

## 2021-01-20 LAB — C-REACTIVE PROTEIN: CRP: 3.7 mg/dL — ABNORMAL HIGH (ref <1.0)

## 2021-01-20 LAB — CBC WITH DIFFERENTIAL/PLATELET
Abs Immature Granulocytes: 0.04 10*3/uL (ref 0.00–0.07)
Basophils Absolute: 0 10*3/uL (ref 0.0–0.1)
Basophils Relative: 0 %
Eosinophils Absolute: 0.7 10*3/uL — ABNORMAL HIGH (ref 0.0–0.5)
Eosinophils Relative: 7 %
HCT: 42.9 % (ref 39.0–52.0)
Hemoglobin: 14.3 g/dL (ref 13.0–17.0)
Immature Granulocytes: 0 %
Lymphocytes Relative: 20 %
Lymphs Abs: 1.9 10*3/uL (ref 0.7–4.0)
MCH: 27.9 pg (ref 26.0–34.0)
MCHC: 33.3 g/dL (ref 30.0–36.0)
MCV: 83.8 fL (ref 80.0–100.0)
Monocytes Absolute: 1.2 10*3/uL — ABNORMAL HIGH (ref 0.1–1.0)
Monocytes Relative: 12 %
Neutro Abs: 5.7 10*3/uL (ref 1.7–7.7)
Neutrophils Relative %: 61 %
Platelets: 265 10*3/uL (ref 150–400)
RBC: 5.12 MIL/uL (ref 4.22–5.81)
RDW: 12.2 % (ref 11.5–15.5)
WBC: 9.5 10*3/uL (ref 4.0–10.5)
nRBC: 0 % (ref 0.0–0.2)

## 2021-01-20 LAB — CBG MONITORING, ED: Glucose-Capillary: 201 mg/dL — ABNORMAL HIGH (ref 70–99)

## 2021-01-20 LAB — GLUCOSE, CAPILLARY: Glucose-Capillary: 127 mg/dL — ABNORMAL HIGH (ref 70–99)

## 2021-01-20 LAB — RESP PANEL BY RT-PCR (FLU A&B, COVID) ARPGX2
Influenza A by PCR: NEGATIVE
Influenza B by PCR: NEGATIVE
SARS Coronavirus 2 by RT PCR: NEGATIVE

## 2021-01-20 LAB — SEDIMENTATION RATE
Sed Rate: 62 mm/hr — ABNORMAL HIGH (ref 0–16)
Sed Rate: 47 mm/hr — ABNORMAL HIGH (ref 0–16)

## 2021-01-20 LAB — PREALBUMIN: Prealbumin: 16.6 mg/dL — ABNORMAL LOW (ref 18–38)

## 2021-01-20 MED ORDER — SODIUM CHLORIDE 0.9 % IV SOLN
2.0000 g | Freq: Once | INTRAVENOUS | Status: AC
  Administered 2021-01-20: 2 g via INTRAVENOUS
  Filled 2021-01-20: qty 20

## 2021-01-20 MED ORDER — VANCOMYCIN HCL IN DEXTROSE 1-5 GM/200ML-% IV SOLN
1000.0000 mg | Freq: Once | INTRAVENOUS | Status: AC
  Administered 2021-01-20: 1000 mg via INTRAVENOUS
  Filled 2021-01-20: qty 200

## 2021-01-20 MED ORDER — ACETAMINOPHEN 650 MG RE SUPP: 650.0000 mg | Freq: Four times a day (QID) | RECTAL | Status: DC | PRN

## 2021-01-20 MED ORDER — ATORVASTATIN CALCIUM 40 MG PO TABS
80.0000 mg | ORAL_TABLET | Freq: Every day | ORAL | Status: DC
  Administered 2021-01-21 – 2021-01-24 (×5): 80 mg via ORAL
  Filled 2021-01-20 (×5): qty 2

## 2021-01-20 MED ORDER — SODIUM CHLORIDE 0.9 % IV SOLN: 250.0000 mL | INTRAVENOUS | Status: DC | PRN

## 2021-01-20 MED ORDER — INSULIN ASPART 100 UNIT/ML IJ SOLN
0.0000 [IU] | INTRAMUSCULAR | Status: DC
Start: 2021-01-20 — End: 2021-01-25
  Administered 2021-01-21: 2 [IU] via SUBCUTANEOUS
  Administered 2021-01-21 – 2021-01-22 (×5): 1 [IU] via SUBCUTANEOUS
  Administered 2021-01-23 (×2): 2 [IU] via SUBCUTANEOUS
  Administered 2021-01-23: 1 [IU] via SUBCUTANEOUS
  Administered 2021-01-23 – 2021-01-24 (×3): 2 [IU] via SUBCUTANEOUS
  Administered 2021-01-24: 1 [IU] via SUBCUTANEOUS
  Administered 2021-01-24: 5 [IU] via SUBCUTANEOUS
  Administered 2021-01-25 (×2): 2 [IU] via SUBCUTANEOUS

## 2021-01-20 MED ORDER — SODIUM CHLORIDE 0.9% FLUSH
3.0000 mL | Freq: Two times a day (BID) | INTRAVENOUS | Status: DC
  Administered 2021-01-21 – 2021-01-25 (×8): 3 mL via INTRAVENOUS

## 2021-01-20 MED ORDER — NICOTINE 21 MG/24HR TD PT24
21.0000 mg | MEDICATED_PATCH | Freq: Every day | TRANSDERMAL | Status: DC
  Administered 2021-01-21 – 2021-01-22 (×2): 21 mg via TRANSDERMAL
  Filled 2021-01-20 (×3): qty 1

## 2021-01-20 MED ORDER — ACETAMINOPHEN 325 MG PO TABS: 650.0000 mg | ORAL_TABLET | Freq: Four times a day (QID) | ORAL | Status: DC | PRN

## 2021-01-20 MED ORDER — HYDROCODONE-ACETAMINOPHEN 5-325 MG PO TABS
1.0000 | ORAL_TABLET | ORAL | Status: DC | PRN
  Administered 2021-01-23: 1 via ORAL
  Administered 2021-01-23 – 2021-01-25 (×5): 2 via ORAL
  Filled 2021-01-20: qty 2
  Filled 2021-01-20: qty 1
  Filled 2021-01-20 (×4): qty 2

## 2021-01-20 MED ORDER — GABAPENTIN 300 MG PO CAPS: 300.0000 mg | ORAL_CAPSULE | Freq: Three times a day (TID) | ORAL | Status: DC | PRN

## 2021-01-20 MED ORDER — VANCOMYCIN HCL 2000 MG/400ML IV SOLN
2000.0000 mg | Freq: Two times a day (BID) | INTRAVENOUS | Status: DC
  Administered 2021-01-21: 2000 mg via INTRAVENOUS
  Filled 2021-01-20 (×3): qty 400

## 2021-01-20 MED ORDER — SODIUM CHLORIDE 0.9% FLUSH: 3.0000 mL | INTRAVENOUS | Status: DC | PRN

## 2021-01-20 MED ORDER — SODIUM CHLORIDE 0.9 % IV SOLN
2.0000 g | INTRAVENOUS | Status: DC
  Administered 2021-01-21 – 2021-01-24 (×4): 2 g via INTRAVENOUS
  Filled 2021-01-20: qty 20
  Filled 2021-01-20: qty 2
  Filled 2021-01-20: qty 20
  Filled 2021-01-20: qty 2
  Filled 2021-01-20: qty 20

## 2021-01-20 MED ORDER — METRONIDAZOLE 500 MG PO TABS
500.0000 mg | ORAL_TABLET | Freq: Two times a day (BID) | ORAL | Status: DC
  Administered 2021-01-21 – 2021-01-25 (×10): 500 mg via ORAL
  Filled 2021-01-20 (×10): qty 1

## 2021-01-20 MED ORDER — SODIUM CHLORIDE 0.9 % IV SOLN: Freq: Once | INTRAVENOUS | Status: AC

## 2021-01-20 MED ORDER — INSULIN GLARGINE-YFGN 100 UNIT/ML ~~LOC~~ SOLN
130.0000 [IU] | Freq: Every day | SUBCUTANEOUS | Status: DC
  Administered 2021-01-21 (×2): 130 [IU] via SUBCUTANEOUS
  Filled 2021-01-20 (×2): qty 1.3

## 2021-01-20 NOTE — ED Notes (Signed)
Pt requesting sprite and coke. Known diabetic, refuses diet soda.

## 2021-01-20 NOTE — ED Triage Notes (Signed)
Rt great toe has ulcer, started on Clindamycin on Monday. He states it has a lot of drainage. Pt feels like it is looking better.

## 2021-01-20 NOTE — ED Notes (Signed)
[name] made aware that patient will be arriving POV instead of by [facility].

## 2021-01-20 NOTE — H&P (Signed)
Martin [name]:[id number] DOB: [birth year] DOA: 01/20/2021     PCP: Patient, No Pcp Per (Inactive)   Outpatient Specialists:     [facility] Patient arrived to ER on 01/20/21 at 1305 Referred by Attending Rhetta Mura, MD   Patient coming from: home Lives With family    Chief Complaint:  Right toe swelling  HPI: Martin [name] is a 55 y.o. male with medical history significant of DM2, HTN, HLD    Presented with  worsening right great toe infection Progressed ober the past 2 wks has been treated with 10 days of Doxy with no improvement and switched to  Clindamycin as an outpatient but continuied to progress and redness swelling spread up the ankle No fever or chills, n/v His son is currently COVID positive but pt has tested negative repeatedly      Initial COVID TEST  NEGATIVE   Lab Results  Component Value Date   SARSCOV2NAA NEGATIVE 01/20/2021   SARSCOV2NAA Not Detected 01/22/2019     Regarding pertinent Chronic problems:     Hyperlipidemia -  on statins Lipitor Lipid Panel     Component Value Date/Time   CHOL 125 11/08/2012 0924   TRIG (H) 11/08/2012 0924    478.0 Triglyceride is over 400; calculations on Lipids are invalid.   HDL 31.30 (L) 11/08/2012 0924   CHOLHDL 4 11/08/2012 0924   VLDL 95.6 (H) 11/08/2012 0924   LDLDIRECT 38.4 11/08/2012 0924    HTN on lisinopril    DM 2 -  Lab Results  Component Value Date   HGBA1C 9.5 10/16/2015   on insulin, PO meds      obesity-   BMI Readings from Last 1 Encounters:  01/20/21 37.94 kg/m     While in ER: Plain images showed evidence of osteomyelitis  Podiatry are aware plan to operate on Friday     ED Triage Vitals  Enc Vitals Group     BP 01/20/21 1319 (!) 141/69     Pulse Rate 01/20/21 1319 79     Resp 01/20/21 1319 17     Temp 01/20/21 1319 97.8 F (36.6 C)     Temp Source 01/20/21 1319 Oral     SpO2 01/20/21 1319 98 %     Weight 01/20/21 1320 272 lb (123.4 kg)      Height 01/20/21 1320 5\' 11"  (1.803 m)     Head Circumference --      Peak Flow --      Pain Score 01/20/21 1320 5     Pain Loc --      Pain Edu? --      Excl. in GC? --   TMAX(24)@     _________________________________________ Significant initial  Findings: Abnormal Labs Reviewed  BASIC METABOLIC PANEL - Abnormal; Notable for the following components:      Result Value   Glucose, Bld 156 (*)    All other components within normal limits  CBC WITH DIFFERENTIAL/PLATELET - Abnormal; Notable for the following components:   Monocytes Absolute 1.2 (*)    Eosinophils Absolute 0.7 (*)    All other components within normal limits  SEDIMENTATION RATE - Abnormal; Notable for the following components:   Sed Rate 62 (*)    All other components within normal limits  CBG MONITORING, ED - Abnormal; Notable for the following components:   Glucose-Capillary 201 (*)    All other components within normal limits   ____________________________________________ Ordered Right great tow - Lytic destruction  seen involving distal tuft of first distal phalanx consistent with osteomyelitis.    ECG: not Ordered   The recent clinical data is shown below. Vitals:   01/20/21 1525 01/20/21 1645 01/20/21 1809 01/20/21 2009  BP: 134/70 140/72 110/68 (!) 142/82  Pulse: 70 72 84 91  Resp: Temp:    98.8 F (37.1 C)  TempSrc:    Oral  SpO2: 100% 99% 99% 99%  Weight:      Height:        WBC     Component Value Date/Time   WBC 9.5 01/20/2021 1420   LYMPHSABS 1.9 01/20/2021 1420   MONOABS 1.2 (H) 01/20/2021 1420   EOSABS 0.7 (H) 01/20/2021 1420   BASOSABS 0.0 01/20/2021 1420       Results for orders placed or performed during the hospital encounter of 01/20/21  Culture, blood (routine x 2)     Status: None (Preliminary result)   Collection Time: 01/20/21  3:05 PM   Specimen: BLOOD  Result Value Ref Range Status   Specimen Description BLOOD BLOOD LEFT FOREARM  Final   Special Requests    Final    BOTTLES DRAWN AEROBIC AND ANAEROBIC Blood Culture results may not be optimal due to an excessive volume of blood received in culture bottles Performed at [facility], [address] [place], [place] [address]    Culture PENDING  Incomplete   Report Status PENDING  Incomplete  Culture, blood (routine x 2)     Status: None (Preliminary result)   Collection Time: 01/20/21  3:05 PM   Specimen: BLOOD  Result Value Ref Range Status   Specimen Description BLOOD RIGHT ANTECUBITAL  Final   Special Requests   Final    BOTTLES DRAWN AEROBIC AND ANAEROBIC Blood Culture results may not be optimal due to an inadequate volume of blood received in culture bottles Performed at [facility], [address], [place], [place] [address]    Culture PENDING  Incomplete   Report Status PENDING  Incomplete  Resp Panel by RT-PCR (Flu A&B, Covid) Nasopharyngeal Swab     Status: None   Collection Time: 01/20/21  4:50 PM   Specimen: Nasopharyngeal Swab; Nasopharyngeal(NP) swabs in vial transport medium  Result Value Ref Range Status   SARS Coronavirus 2 by RT PCR NEGATIVE NEGATIVE Final         Influenza A by PCR NEGATIVE NEGATIVE Final   Influenza B by PCR NEGATIVE NEGATIVE Final           _______________________________________________________ ER Provider Called:     Dr. Logan Bores, podiatry has been made aware.   They Recommend admit to medicine   Will see in AM    _______________________________________________ Hospitalist was called for admission for osteomyelitis  The following Work up has been ordered so far:  Orders Placed This Encounter  Procedures   Culture, blood (routine x 2)   Resp Panel by RT-PCR (Flu A&B, Covid) Nasopharyngeal Swab   DG Toe Great Right   Basic metabolic panel   CBC with Differential   Sedimentation rate   Secure IV for POV transfer   vancomycin per pharmacy consult   Consult to hospitalist   POC CBG, ED   CBG monitoring, ED   Admit to Inpatient  (patient's expected length of stay will be greater than 2 midnights or inpatient only procedure)      Following Medications were ordered in ER: Medications  vancomycin (VANCOREADY) IVPB 2000  mg/400 mL (has no administration in time range)  0.9 %  sodium chloride infusion (has no administration in time range)  cefTRIAXone (ROCEPHIN) 2 g in sodium chloride 0.9 % 100 mL IVPB (0 g Intravenous Stopped 01/20/21 1641)  vancomycin (VANCOCIN) IVPB 1000 mg/200 mL premix (0 mg Intravenous Stopped 01/20/21 1801)  vancomycin (VANCOCIN) IVPB 1000 mg/200 mL premix (0 mg Intravenous Stopped 01/20/21 1912)        Consult Orders  (From admission, onward)           Start     Ordered   01/20/21 1512  Consult to hospitalist  Spoke to [place] in [place] 1530  Once       Provider:  (Not yet assigned)  Question Answer Comment  Place call to: [facility]   Reason for Consult Admit      01/20/21 1511              OTHER Significant initial  Findings:  labs showing:  Recent Labs  Lab 01/20/21 1420  NA 135  K 4.5  CO2 25  GLUCOSE 156*  BUN 16  CREATININE 0.67  CALCIUM 9.8    Cr    stable,   Lab Results  Component Value Date   CREATININE 0.67 01/20/2021   CREATININE 0.95 04/12/2019   CREATININE 0.99 11/02/2017    No results for input(s): AST, ALT, ALKPHOS, BILITOT, PROT, ALBUMIN in the last 168 hours. Lab Results  Component Value Date   CALCIUM 9.8 01/20/2021    Plt: Lab Results  Component Value Date   PLT 265 01/20/2021       Recent Labs  Lab 01/20/21 1420  WBC 9.5  NEUTROABS 5.7  HGB 14.3  HCT 42.9  MCV 83.8  PLT 265    HG/HCT  stable,     Component Value Date/Time   HGB 14.3 01/20/2021 1420   HCT 42.9 01/20/2021 1420   MCV 83.8 01/20/2021 1420      DM  labs:  HbA1C: No results for input(s): HGBA1C in the last 8760 hours.     CBG (last 3)  Recent Labs    01/20/21 1325  GLUCAP 201*    Cultures:    Component Value Date/Time   SDES BLOOD  BLOOD LEFT FOREARM 01/20/2021 1505   SDES BLOOD RIGHT ANTECUBITAL 01/20/2021 1505   SPECREQUEST  01/20/2021 1505    BOTTLES DRAWN AEROBIC AND ANAEROBIC Blood Culture results may not be optimal due to an excessive volume of blood received in culture bottles Performed at [facility], [address] [place], [place] [address]    SPECREQUEST  01/20/2021 1505    BOTTLES DRAWN AEROBIC AND ANAEROBIC Blood Culture results may not be optimal due to an inadequate volume of blood received in culture bottles Performed at [facility], [address], [place], [place] [address]    CULT PENDING 01/20/2021 1505   CULT PENDING 01/20/2021 1505   REPTSTATUS PENDING 01/20/2021 1505   REPTSTATUS PENDING 01/20/2021 1505     Radiological Exams on Admission: DG Toe Great Right  Result Date: 01/20/2021 CLINICAL DATA:  Ulcer. EXAM: RIGHT GREAT TOE COMPARISON:  None. FINDINGS: Lytic destruction is seen involving the distal tuft of the first distal phalanx concerning for osteomyelitis. Overlying soft tissue ulceration is noted. Joint spaces are intact. IMPRESSION: Lytic destruction seen involving distal tuft of first distal phalanx consistent with osteomyelitis. Electronically Signed   By: Lupita Raider M.D.   On: 01/20/2021 14:44  _______________________________________________________________________________________________________ Latest  Blood pressure (!) 142/82, pulse 91, temperature 98.8 F (37.1 C), temperature source Oral, resp. rate 15, height 5\' 11"  (1.803 m), weight 123.4 kg, SpO2 99 %.   Review of Systems:    Pertinent positives include:  fatigue, redness swelling of right foot  Constitutional:  No weight loss, night sweats, Fevers, chills, weight loss  HEENT:  No headaches, Difficulty swallowing,Tooth/dental problems,Sore throat,  No sneezing, itching, ear ache, nasal congestion, post nasal drip,  Cardio-vascular:  No chest pain, Orthopnea, PND, anasarca, dizziness,  palpitations.no Bilateral lower extremity swelling  GI:  No heartburn, indigestion, abdominal pain, nausea, vomiting, diarrhea, change in bowel habits, loss of appetite, melena, blood in stool, hematemesis Resp:  no shortness of breath at rest. No dyspnea on exertion, No excess mucus, no productive cough, No non-productive cough, No coughing up of blood.No change in color of mucus.No wheezing. Skin:  no rash or lesions. No jaundice GU:  no dysuria, change in color of urine, no urgency or frequency. No straining to urinate.  No flank pain.  Musculoskeletal:  No joint pain or no joint swelling. No decreased range of motion. No back pain.  Psych:  No change in mood or affect. No depression or anxiety. No memory loss.  Neuro: no localizing neurological complaints, no tingling, no weakness, no double vision, no gait abnormality, no slurred speech, no confusion  All systems reviewed and apart from HOPI all are negative _______________________________________________________________________________________________ Past Medical History:   Past Medical History:  Diagnosis Date   Chest pain    Diabetes mellitus    Gout    Hyperlipidemia    Hypertension    Obesity       Past Surgical History:  Procedure Laterality Date   UMBILICAL HERNIA REPAIR      Social History:  Ambulatory   independently        reports that he has never smoked. His smokeless tobacco use includes chew. He reports current alcohol use. He reports that he does not use drugs.     Family History:   Family History  Problem Relation Age of Onset   Diabetes Maternal Grandfather    Kidney disease Other    ______________________________________________________________________________________________ Allergies: No Known Allergies   Prior to Admission medications   Medication Sig Start Date End Date Taking? Authorizing Provider  aspirin 81 MG tablet Take 81 mg by mouth daily.    [provider]   atorvastatin (LIPITOR) 80 MG tablet TAKE 1 TABLET BY MOUTH ONCE A DAY AT BEDTIME 06/04/20 06/04/21  06/06/21, MD  atorvastatin (LIPITOR) 80 MG tablet Take 1 tablet (80 mg total) by mouth at bedtime. 10/22/20     clindamycin (CLEOCIN) 300 MG capsule Take 1 capsule (300 mg total) by mouth 3 (three) times daily. 01/18/21   01/20/21, DPM  cyclobenzaprine (FLEXERIL) 5 MG tablet TAKE 1 TABLET BY MOUTH EVERY 12 HOURS AS NEEDED FOR MUSCLE SPASMS AND PAIN. 06/01/20 06/01/21  06/03/21, PA-C  cyclobenzaprine (FLEXERIL) 5 MG tablet TAKE 1 TABLET BY MOUTH EVERY EIGHT TO TWELVE HOURS 05/22/20 05/22/21  05/24/21, PA-C  doxycycline (VIBRA-TABS) 100 MG tablet Take 1 tablet (100 mg total) by mouth 2 (two) times daily. 01/04/21   03/06/21, DPM  empagliflozin (JARDIANCE) 25 MG TABS tablet Take 1 tablet (25 mg total) by mouth daily. 09/17/20     furosemide (LASIX) 20 MG tablet TAKE 1 TABLET BY MOUTH EVERY MORNING FOR 4 DAYS 06/03/20 06/03/21  Lazoff, Shawn P,  DO  gabapentin (NEURONTIN) 300 MG capsule Take 1 capsule (300 mg total) by mouth 3 (three) times daily as needed. 05/17/20   Terrilee Files, MD  gentamicin cream (GARAMYCIN) 0.1 % Apply 1 application topically 3 (three) times daily. 12/14/20   Felecia Shelling, DPM  insulin aspart (NOVOLOG) 100 UNIT/ML FlexPen INJECT 10 UNITS INTO THE SKIN ONCE DAILY AT THE LARGEST MEAL OF THE DAY. 08/28/20 08/28/21  Talmage Coin, MD  insulin degludec (TRESIBA) 200 UNIT/ML FlexTouch Pen INJECT 160 UNITS UNDER THE SKIN ONCE A DAY 05/12/20 05/12/21  Talmage Coin, MD  insulin lispro (HUMALOG) 100 UNIT/ML KwikPen INJECT 10 UNITS INTO THE SKIN ONCE DAILY AT LARGEST MEAL OF DAY. 08/28/20 08/28/21  Talmage Coin, MD  Insulin Pen Needle 31G X 6 MM MISC USE TO INJECT INSULIN TWICE A DAY 08/28/20 08/28/21  Talmage Coin, MD  Insulin Pen Needle 31G X 6 MM MISC USE AS DIRECTED ONCE A DAY 05/12/20 05/12/21  Talmage Coin, MD  lisinopril (ZESTRIL) 10 MG tablet TAKE 1 TABLET BY  MOUTH ONCE DAILY. 08/21/20 08/21/21  Talmage Coin, MD  lisinopril (ZESTRIL) 10 MG tablet Take 1 tablet (10 mg total) by mouth daily. 12/11/20     meloxicam (MOBIC) 15 MG tablet TAKE 1 TABLET BY MOUTH DAILY WITH FOOD FOR 10-14 DAYS AND THEN AS NEEDED AFTER THAT. 05/20/20 05/20/21  Elodia Florence, PA-C  metFORMIN (GLUCOPHAGE-XR) 500 MG 24 hr tablet TAKE 2 TABLETS BY MOUTH WITH A MEAL TWICE A DAY. 08/28/20 08/28/21  Talmage Coin, MD  metoCLOPramide (REGLAN) 10 MG tablet Take 1 tablet (10 mg total) by mouth every 6 (six) hours as needed for nausea or vomiting. Patient not taking: Reported on 07/06/2020 04/13/19   Molpus, John, MD  omega-3 acid ethyl esters (LOVAZA) 1 G capsule TAKE 1 CAPSULE BY MOUTH 2 TIMES DAILY WITH MEALS Patient not taking: Reported on 07/06/2020 02/06/14   Carlus Pavlov, MD  ondansetron (ZOFRAN ODT) 8 MG disintegrating tablet Take 1 tablet (8 mg total) by mouth every 8 (eight) hours as needed for nausea or vomiting. Patient not taking: Reported on 07/06/2020 04/13/19   Molpus, Jonny Ruiz, MD  [facility] VERIO test strip USE AS INSTRUCTED 2-3 X A DAY. 08/07/13   Carlus Pavlov, MD  sildenafil (VIAGRA) 100 MG tablet Take 1 tablet (100 mg total) by mouth up to once daily as needed 12/11/20     sildenafil (VIAGRA) 50 MG tablet Take 1 tablet (50 mg total) by mouth as needed up to once daily 11/11/20     tadalafil (CIALIS) 10 MG tablet TAKE ONE HALF TO ONE TABLET BY MOUTH AS NEEDED UP TO ONCE A DAY- 1 REFILL ONLY, MUST HAVE OFFICE VISIT 04/29/20 04/29/21  Talmage Coin, MD  tadalafil (CIALIS) 20 MG tablet TAKE 1 TABLET BY MOOUTH ONCE A DAY 08/17/20 08/17/21  Talmage Coin, MD  tiZANidine (ZANAFLEX) 4 MG capsule Take 4 mg by mouth 3 (three) times daily.    [provider]  tiZANidine (ZANAFLEX) 4 MG tablet TAKE 1 TABLET BY MOUTH EVERY 6 TO 8 HOURS AS NEEDED FOR MUSCLE SPASMS AND PAIN. 06/01/20 06/01/21  Shanon Payor, PA-C     ___________________________________________________________________________________________________ Physical Exam: Vitals with BMI 01/20/2021 01/20/2021 01/20/2021  Height - - -  Weight - - -  BMI - - -  Systolic 142 110 191  Diastolic 82 68 72  Pulse 91 84 72     1. General:  in No  Acute distress    Chronically ill   -  appearing 2. Psychological: Alert and   Oriented 3. Head/ENT:   Dry Mucous Membranes                          Head Non traumatic, neck supple                           Poor Dentition 4. SKIN:  decreased Skin turgor,  Skin clean Dry  purulent discharge from right great toe       5. Heart: Regular rate and rhythm no  Murmur, no Rub or gallop 6. Lungs:   Clear to auscultation bilaterally, no wheezes or crackles   7. Abdomen: Soft,  non-tender, Non distended   obese  bowel sounds present 8. Lower extremities: no clubbing, cyanosis,  R>L  edema 9. Neurologically Grossly intact, moving all 4 extremities equally  10. MSK: Normal range of motion    Chart has been reviewed  ______________________________________________________________________________________________  Assessment/Plan 55 y.o. male with medical history significant of DM2, HTN, HLD   Admitted for osteomyelitis  Present on Admission:  Osteomyelitis (HCC) NPO at Friday midnight for OR on Friday Continue broad spectrum antibiotics vanc, flagyl and ceftriaxone Podiatry is aware will see in consult  Obtain ABI, CRP , Sed rate   Hypertension - restart lisinopril if BP allows, hold for tonight  hyperlipidemia - restArt lipitor  DM 2-  - Order Sensitive   SSI   -   home insulin  switch to   Lantus  130 units,  -  check TSH and HgA1C  - Hold by mouth medications    Other plan as per orders.  DVT prophylaxis:  SCD       Code Status:    Code Status: Not on file FULL CODE   as per patient  I had personally discussed CODE STATUS with patient      Family Communication:   Family not at   Bedside    Disposition Plan:       To home once workup is complete and patient is stable   Following barriers for discharge:                                                        Pain controlled with PO medications                              able to transition to PO antibiotics                             Will need to be able to tolerate PO                                                        Will need consultants to evaluate patient prior to discharge     Consults called:  podiatry spoke to Dr. Ardelle AntonWagoner  Admission status:  ED Disposition     ED Disposition  Admit   Condition  --   Comment  Hospital Area: [facility] [[id number]]  Level of Care: Med-Surg [[id number]]  May admit patient to [facility] or [facility] if equivalent level of care is available:: Yes  Interfacility transfer: Yes  Covid Evaluation: Confirmed COVID Negative  Date Laboratory Confirmed COVID Negative: 01/20/2021  Diagnosis: Osteomyelitis [facility]) [[id number]]  Admitting Physician: Rhetta Mura [[id number]]  Attending Physician: Rhetta Mura [phone]  Estimated length of stay: 3 - 4 days  Certification:: I certify this patient will need inpatient services for at least 2 midnights           inpatient     I Expect 2 midnight stay secondary to severity of patient's current illness need for inpatient interventions justified by the following:  Severe lab/radiological/exam abnormalities including:    Ostemyelitis  and extensive comorbidities including:  DM2    That are currently affecting medical management.   I expect  patient to be hospitalized for 2 midnights requiring inpatient medical care.  Patient is at high risk for adverse outcome (such as loss of life or disability) if not treated.  Indication for inpatient stay as follows:    Need for operative/procedural  intervention    Need for IV antibiotics, IV fluids,    Level of care       medical floor       Lab Results   Component Value Date   SARSCOV2NAA NEGATIVE 01/20/2021     Precautions: admitted as   Covid Negative    PPE: Used by the provider:   N95  eye Goggles,  Gloves      Kenney Going 01/20/2021, 11:08 PM    [facility]     after 2 AM please page floor coverage PA If 7AM-7PM, please contact the day team taking care of the patient using [email or web address]   Patient was evaluated in the context of the global COVID-19 pandemic, which necessitated consideration that the patient might be at risk for infection with the SARS-CoV-2 virus that causes COVID-19. Institutional protocols and algorithms that pertain to the evaluation of patients at risk for COVID-19 are in a state of rapid change based on information released by regulatory bodies including the CDC and federal and state organizations. These policies and algorithms were followed during the patient's care.

## 2021-01-20 NOTE — ED Notes (Signed)
Presents with rt great toe pain, swelling and pain, states has been on Clindamycin since Monday. Toe appears very swollen, wife states drainage has been noted and observed as well. Appears to have an open wound at base / underside of rt great toe. Pt has hx of Diabetes, states last CBG reading was 200mg /dl at home

## 2021-01-20 NOTE — ED Provider Notes (Signed)
[facility] EMERGENCY DEPARTMENT Provider Note  CSN: [id number] Arrival date & time: 01/20/21 1305    History No chief complaint on file.   Martin Martin is a 55 y.o. male with history of DM has been seeing Dr. Logan Bores, Podiatry, for an ulcer on his R great toe that has not been healing well. Over the last several days it has been more swollen and red. Wife sent pictures to Dr. Logan Bores and he called in Rx for Clindamycin which he has been taking for 2 days with minimal improvement. He has not had a fever.    Past Medical History:  Diagnosis Date   Chest pain    Diabetes mellitus    Gout    Hyperlipidemia    Hypertension    Obesity     Past Surgical History:  Procedure Laterality Date   UMBILICAL HERNIA REPAIR      Family History  Problem Relation Age of Onset   Diabetes Maternal Grandfather    Kidney disease Other     Social History   Tobacco Use   Smoking status: Never   Smokeless tobacco: Current    Types: Chew  Vaping Use   Vaping Use: Never used  Substance Use Topics   Alcohol use: Yes    Alcohol/week: 0.0 standard drinks    Comment: occ   Drug use: No     Home Medications Prior to Admission medications   Medication Sig Start Date End Date Taking? Authorizing Provider  aspirin 81 MG tablet Take 81 mg by mouth daily.    [provider]  atorvastatin (LIPITOR) 80 MG tablet TAKE 1 TABLET BY MOUTH ONCE A DAY AT BEDTIME 06/04/20 06/04/21  Talmage Coin, MD  atorvastatin (LIPITOR) 80 MG tablet Take 1 tablet (80 mg total) by mouth at bedtime. 10/22/20     clindamycin (CLEOCIN) 300 MG capsule Take 1 capsule (300 mg total) by mouth 3 (three) times daily. 01/18/21   Felecia Shelling, DPM  cyclobenzaprine (FLEXERIL) 5 MG tablet TAKE 1 TABLET BY MOUTH EVERY 12 HOURS AS NEEDED FOR MUSCLE SPASMS AND PAIN. 06/01/20 06/01/21  Shanon Payor, PA-C  cyclobenzaprine (FLEXERIL) 5 MG tablet TAKE 1 TABLET BY MOUTH EVERY EIGHT TO TWELVE HOURS 05/22/20  05/22/21  Elodia Florence, PA-C  doxycycline (VIBRA-TABS) 100 MG tablet Take 1 tablet (100 mg total) by mouth 2 (two) times daily. 01/04/21   Felecia Shelling, DPM  Dulaglutide 1.5 MG/0.5ML SOPN Inject 1.5 mg into the skin once a week. Patient not taking: Reported on 07/06/2020    [provider]  empagliflozin (JARDIANCE) 25 MG TABS tablet Take 1 tablet (25 mg total) by mouth daily. 09/17/20     fenofibrate 160 MG tablet Take 160 mg by mouth daily. Patient not taking: Reported on 07/06/2020    [provider]  furosemide (LASIX) 20 MG tablet TAKE 1 TABLET BY MOUTH EVERY MORNING FOR 4 DAYS 06/03/20 06/03/21  Lazoff, Shawn P, DO  gabapentin (NEURONTIN) 300 MG capsule Take 1 capsule (300 mg total) by mouth 3 (three) times daily as needed. 05/17/20   Terrilee Files, MD  gentamicin cream (GARAMYCIN) 0.1 % Apply 1 application topically 3 (three) times daily. 12/14/20   Felecia Shelling, DPM  insulin aspart (NOVOLOG) 100 UNIT/ML FlexPen INJECT 10 UNITS INTO THE SKIN ONCE DAILY AT THE LARGEST MEAL OF THE DAY. 08/28/20 08/28/21  Talmage Coin, MD  insulin degludec (TRESIBA) 200 UNIT/ML FlexTouch Pen INJECT 160 UNITS UNDER THE SKIN ONCE A  DAY 05/12/20 05/12/21  Talmage Coin, MD  insulin glargine (LANTUS) 100 UNIT/ML injection Inject 100 Units into the skin at bedtime.  Patient not taking: Reported on 07/06/2020 08/17/12   Carlus Pavlov, MD  insulin lispro (HUMALOG) 100 UNIT/ML KwikPen INJECT 10 UNITS INTO THE SKIN ONCE DAILY AT LARGEST MEAL OF DAY. 08/28/20 08/28/21  Talmage Coin, MD  Insulin Pen Needle 31G X 6 MM MISC USE TO INJECT INSULIN TWICE A DAY 08/28/20 08/28/21  Talmage Coin, MD  Insulin Pen Needle 31G X 6 MM MISC USE AS DIRECTED ONCE A DAY 05/12/20 05/12/21  Talmage Coin, MD  lisinopril (PRINIVIL,ZESTRIL) 10 MG tablet Take 10 mg by mouth daily.    [provider]  lisinopril (ZESTRIL) 10 MG tablet TAKE 1 TABLET BY MOUTH ONCE DAILY. 08/21/20 08/21/21  Talmage Coin, MD  lisinopril  (ZESTRIL) 10 MG tablet Take 1 tablet (10 mg total) by mouth daily. 12/11/20     meloxicam (MOBIC) 15 MG tablet TAKE 1 TABLET BY MOUTH DAILY WITH FOOD FOR 10-14 DAYS AND THEN AS NEEDED AFTER THAT. 05/20/20 05/20/21  Elodia Florence, PA-C  metFORMIN (GLUCOPHAGE-XR) 500 MG 24 hr tablet  06/11/18   [provider]  metFORMIN (GLUCOPHAGE-XR) 500 MG 24 hr tablet TAKE 2 TABLETS BY MOUTH WITH A MEAL TWICE A DAY. 08/28/20 08/28/21  Talmage Coin, MD  metFORMIN (GLUCOPHAGE-XR) 500 MG 24 hr tablet TAKE 2 TABLETS BY MOUTH WITH A MEAL TWICE A DAY 04/06/20 04/06/21  Talmage Coin, MD  metoCLOPramide (REGLAN) 10 MG tablet Take 1 tablet (10 mg total) by mouth every 6 (six) hours as needed for nausea or vomiting. Patient not taking: Reported on 07/06/2020 04/13/19   Molpus, John, MD  omega-3 acid ethyl esters (LOVAZA) 1 G capsule TAKE 1 CAPSULE BY MOUTH 2 TIMES DAILY WITH MEALS Patient not taking: Reported on 07/06/2020 02/06/14   Carlus Pavlov, MD  ondansetron (ZOFRAN ODT) 8 MG disintegrating tablet Take 1 tablet (8 mg total) by mouth every 8 (eight) hours as needed for nausea or vomiting. Patient not taking: Reported on 07/06/2020 04/13/19   Molpus, Jonny Ruiz, MD  [facility] VERIO test strip USE AS INSTRUCTED 2-3 X A DAY. 08/07/13   Carlus Pavlov, MD  sildenafil (VIAGRA) 100 MG tablet Take 1 tablet (100 mg total) by mouth up to once daily as needed 12/11/20     sildenafil (VIAGRA) 50 MG tablet Take 1 tablet (50 mg total) by mouth as needed up to once daily 11/11/20     tadalafil (CIALIS) 10 MG tablet  04/26/18   [provider]  tadalafil (CIALIS) 10 MG tablet TAKE ONE HALF TO ONE TABLET BY MOUTH AS NEEDED UP TO ONCE A DAY- 1 REFILL ONLY, MUST HAVE OFFICE VISIT 04/29/20 04/29/21  Talmage Coin, MD  tadalafil (CIALIS) 20 MG tablet TAKE 1 TABLET BY MOOUTH ONCE A DAY 08/17/20 08/17/21  Talmage Coin, MD  tiZANidine (ZANAFLEX) 4 MG capsule Take 4 mg by mouth 3 (three) times daily.    [provider]  tiZANidine  (ZANAFLEX) 4 MG tablet TAKE 1 TABLET BY MOUTH EVERY 6 TO 8 HOURS AS NEEDED FOR MUSCLE SPASMS AND PAIN. 06/01/20 06/01/21  Shanon Payor, PA-C  TIZANIDINE HCL PO Take by mouth.    [provider]     Allergies    Patient has no known allergies.   Review of Systems   Review of Systems A comprehensive review of systems was completed and negative except as noted in HPI.    Physical Exam  BP (!) 141/69 (BP Location: Right Arm)   Pulse 79   Temp 97.8 F (36.6 C) (Oral)   Resp 17   Ht 5\' 11"  (1.803 m)   Wt 123.4 kg   SpO2 98%   BMI 37.94 kg/m   Physical Exam Vitals and nursing note reviewed.  HENT:     Head: Normocephalic.     Nose: Nose normal.  Eyes:     Extraocular Movements: Extraocular movements intact.  Cardiovascular:     Rate and Rhythm: Normal rate.  Pulmonary:     Effort: Pulmonary effort is normal.  Musculoskeletal:        General: Normal range of motion.     Cervical back: Neck supple.  Skin:    Findings: No rash (on exposed skin).     Comments: Marked erythema, induration of his R great toe with skin breadown on tip/pad. Some purulent drainage expressed  Neurological:     Mental Status: He is alert and oriented to person, place, and time.  Psychiatric:        Mood and Affect: Mood normal.      ED Results / Procedures / Treatments   Labs (all labs ordered are listed, but only abnormal results are displayed) Labs Reviewed  CBG MONITORING, ED - Abnormal; Notable for the following components:      Result Value   Glucose-Capillary 201 (*)    All other components within normal limits  BASIC METABOLIC PANEL  CBC WITH DIFFERENTIAL/PLATELET  SEDIMENTATION RATE  CBG MONITORING, ED    EKG None   Radiology No results found.  Procedures Procedures  Medications Ordered in the ED Medications - No data to display   MDM Rules/Calculators/A&P MDM Patient with infected R great toe, will check labs, xray to evaluate signs of osteomyelitis.    ED Course  I have reviewed the triage vital signs and the nursing notes.  Pertinent labs & imaging results that were available during my care of the patient were reviewed by me and considered in my medical decision making (see chart for details).  Clinical Course as of 01/21/21 0656  Wed Jan 20, 2021  1439 CBC is normal.  [CS]  1449 Xray concerning for developing osteo, will add blood cultures, begin Vanc and Rocephin, plan admission when labs resulted.  [CS]  1511 Care of the patient signed out to Dr. Jan 22, 2021 at the change of shift.  [CS]    Clinical Course User Index [CS] Hyman Bower, MD    Final Clinical Impression(s) / ED Diagnoses Final diagnoses:  None    Rx / DC Orders ED Discharge Orders     None        Pollyann Savoy, MD 01/21/21 [phone]

## 2021-01-20 NOTE — Progress Notes (Signed)
Pharmacy Antibiotic Note  SARAH ZERBY is a 55 y.o. male admitted on 01/20/2021 with  osteomyelitis . Pharmacy has been consulted for vancomycin dosing. Pt is afebrile and WBC is WNL. Scr is WNL at 0.67. Pt was on clindamycin PTA without improvement.   Plan: Vanc 2gm IV x 1 then 2gm IV Q12H F/u renal fxn, C&S, clinical status and peak/trough at [facility] F/u continuation of gram negative coverage  Height: 5\' 11"  (180.3 cm) Weight: 123.4 kg (272 lb) IBW/kg (Calculated) : 75.3  Temp (24hrs), Avg:97.8 F (36.6 C), Min:97.8 F (36.6 C), Max:97.8 F (36.6 C)  Recent Labs  Lab 01/20/21 1420  WBC 9.5  CREATININE 0.67    Estimated Creatinine Clearance: 139.5 mL/min (by C-G formula based on SCr of 0.67 mg/dL).    No Known Allergies  Antimicrobials this admission: Vanc 8/17>> CTX x 1 8/17  Dose adjustments this admission: N/A  Microbiology results: Pending  Thank you for allowing pharmacy to be a part of this patient's care.  Cheyann Blecha, 9/17 01/20/2021 3:01 PM

## 2021-01-20 NOTE — ED Notes (Signed)
Casimiro Needle RN provided instructions to report to [facility] for admission, wife with pt, IV secured, POV transport approved by ED MD ([place]). Opportunity for questions provided.

## 2021-01-20 NOTE — ED Provider Notes (Signed)
55 year old diabetic male presented for worsening right great toe infection, work-up is significant for osteomyelitis. Spoke with admitting service who is excepting.  Requests IV fluids, COVID test.  Dr. Logan Bores, podiatry has been made aware.  Admitting service aware to consult podiatry service, patient admitted.   Rozelle Logan, DO 01/20/21 1601

## 2021-01-21 ENCOUNTER — Inpatient Hospital Stay (HOSPITAL_COMMUNITY): Payer: 59

## 2021-01-21 DIAGNOSIS — L0390 Cellulitis, unspecified: Secondary | ICD-10-CM | POA: Diagnosis not present

## 2021-01-21 LAB — TSH: TSH: 4.587 u[IU]/mL — ABNORMAL HIGH (ref 0.350–4.500)

## 2021-01-21 LAB — CBC WITH DIFFERENTIAL/PLATELET
Abs Immature Granulocytes: 0.1 10*3/uL — ABNORMAL HIGH (ref 0.00–0.07)
Basophils Absolute: 0 10*3/uL (ref 0.0–0.1)
Basophils Relative: 0 %
Eosinophils Absolute: 0.5 10*3/uL (ref 0.0–0.5)
Eosinophils Relative: 5 %
HCT: 43.1 % (ref 39.0–52.0)
Hemoglobin: 13.9 g/dL (ref 13.0–17.0)
Immature Granulocytes: 1 %
Lymphocytes Relative: 16 %
Lymphs Abs: 1.7 10*3/uL (ref 0.7–4.0)
MCH: 28.2 pg (ref 26.0–34.0)
MCHC: 32.3 g/dL (ref 30.0–36.0)
MCV: 87.4 fL (ref 80.0–100.0)
Monocytes Absolute: 1 10*3/uL (ref 0.1–1.0)
Monocytes Relative: 10 %
Neutro Abs: 7.1 10*3/uL (ref 1.7–7.7)
Neutrophils Relative %: 68 %
Platelets: 282 10*3/uL (ref 150–400)
RBC: 4.93 MIL/uL (ref 4.22–5.81)
RDW: 12.4 % (ref 11.5–15.5)
WBC: 10.3 10*3/uL (ref 4.0–10.5)
nRBC: 0 % (ref 0.0–0.2)

## 2021-01-21 LAB — COMPREHENSIVE METABOLIC PANEL
ALT: 22 U/L (ref 0–44)
AST: 19 U/L (ref 15–41)
Albumin: 3 g/dL — ABNORMAL LOW (ref 3.5–5.0)
Alkaline Phosphatase: 80 U/L (ref 38–126)
Anion gap: 7 (ref 5–15)
BUN: 16 mg/dL (ref 6–20)
CO2: 26 mmol/L (ref 22–32)
Calcium: 9.4 mg/dL (ref 8.9–10.3)
Chloride: 105 mmol/L (ref 98–111)
Creatinine, Ser: 0.68 mg/dL (ref 0.61–1.24)
GFR, Estimated: >60 mL/min (ref >60)
Glucose, Bld: 134 mg/dL — ABNORMAL HIGH (ref 70–99)
Potassium: 4.1 mmol/L (ref 3.5–5.1)
Sodium: 138 mmol/L (ref 135–145)
Total Bilirubin: 0.5 mg/dL (ref 0.3–1.2)
Total Protein: 7.1 g/dL (ref 6.5–8.1)

## 2021-01-21 LAB — HIV ANTIBODY (ROUTINE TESTING W REFLEX): HIV Screen 4th Generation wRfx: NONREACTIVE

## 2021-01-21 LAB — GLUCOSE, CAPILLARY
Glucose-Capillary: 105 mg/dL — ABNORMAL HIGH (ref 70–99)
Glucose-Capillary: 142 mg/dL — ABNORMAL HIGH (ref 70–99)
Glucose-Capillary: 93 mg/dL (ref 70–99)
Glucose-Capillary: 189 mg/dL — ABNORMAL HIGH (ref 70–99)
Glucose-Capillary: 133 mg/dL — ABNORMAL HIGH (ref 70–99)
Glucose-Capillary: 141 mg/dL — ABNORMAL HIGH (ref 70–99)

## 2021-01-21 LAB — HEMOGLOBIN A1C
Hgb A1c MFr Bld: 11.1 % — ABNORMAL HIGH (ref 4.8–5.6)
Mean Plasma Glucose: 271.87 mg/dL

## 2021-01-21 LAB — PHOSPHORUS: Phosphorus: 3.6 mg/dL (ref 2.5–4.6)

## 2021-01-21 LAB — MAGNESIUM: Magnesium: 1.7 mg/dL (ref 1.7–2.4)

## 2021-01-21 MED ORDER — LISINOPRIL 10 MG PO TABS
10.0000 mg | ORAL_TABLET | Freq: Every day | ORAL | Status: DC
  Administered 2021-01-21 – 2021-01-25 (×5): 10 mg via ORAL
  Filled 2021-01-21 (×5): qty 1

## 2021-01-21 MED ORDER — CHLORHEXIDINE GLUCONATE CLOTH 2 % EX PADS: 6.0000 | MEDICATED_PAD | Freq: Once | CUTANEOUS | Status: DC

## 2021-01-21 MED ORDER — CHLORHEXIDINE GLUCONATE CLOTH 2 % EX PADS
6.0000 | MEDICATED_PAD | Freq: Once | CUTANEOUS | Status: AC
  Administered 2021-01-21: 6 via TOPICAL

## 2021-01-21 MED ORDER — VANCOMYCIN HCL 1500 MG/300ML IV SOLN
1500.0000 mg | Freq: Two times a day (BID) | INTRAVENOUS | Status: DC
  Administered 2021-01-21 – 2021-01-25 (×8): 1500 mg via INTRAVENOUS
  Filled 2021-01-21 (×9): qty 300

## 2021-01-21 NOTE — Progress Notes (Signed)
Initial Nutrition Assessment  DOCUMENTATION CODES:   Obesity unspecified  INTERVENTION:   -Reviewed diet recommendations with patient to aid in wound healing  -Encouraged PO intakes  NUTRITION DIAGNOSIS:   Increased nutrient needs related to wound healing as evidenced by estimated needs  GOAL:   Patient will meet greater than or equal to 90% of their needs  MONITOR:   PO intake, Supplement acceptance, Labs, Weight trends, I & O's  REASON FOR ASSESSMENT:   Consult Wound healing  ASSESSMENT:   55 year old male with past medical history significant for type 2 diabetes, hypertension, hyperlipidemia presents for worsening right toe infection.  He previously been on 10 days of doxycycline without any improvement he called the office and he was switched to clindamycin on Monday.  Infection has worsened and he was recommended to the emergency department in which she was subsequently admitted for osteomyelitis of the right big toe.  Patient in room, noticeably anxious. Pt states he is eating well and he has no diet related questions for RD. States his main concern is about his toe amputation. Encouraged pt to include protein foods with every meal and snack to promote blood sugar control and aid in wound healing. Pt consumed 100% of breakfast this morning. Had containers of grapes and veggies to snack on.  Pt plan to have procedure tomorrow, NPO after midnight.  Per weight records, pt's weight has increased.  Medications reviewed.  Labs reviewed:  CBGs: 93-189  NUTRITION - FOCUSED PHYSICAL EXAM:  Deferred.  Diet Order:   Diet Order             Diet NPO time specified  Diet effective midnight           Diet Carb Modified Fluid consistency: Thin; Room service appropriate? Yes  Diet effective now                   EDUCATION NEEDS:   Education needs have been addressed  Skin:  Skin Assessment: Skin Integrity Issues: Skin Integrity Issues:: Other (Comment) Other:  osteomyelitis of big toe  Last BM:  8/16  Height:   Ht Readings from Last 1 Encounters:  01/20/21 5\' 11"  (1.803 m)    Weight:   Wt Readings from Last 1 Encounters:  01/20/21 123.4 kg    BMI:  Body mass index is 37.94 kg/m.  Estimated Nutritional Needs:   Kcal:  1800-2100  Protein:  100-115g  Fluid:  2L/day  01/22/21, MS, RD, LDN Inpatient Clinical Dietitian Contact information available via Amion

## 2021-01-21 NOTE — Progress Notes (Signed)
ABI's have been completed. Preliminary results can be found in CV Proc through chart review.   01/21/21 8:43 AM Olen Cordial RVT

## 2021-01-21 NOTE — Progress Notes (Addendum)
RN came in the room to give insulin injection. RN saw pt. with 2 trays, a whole salad, a sandwich and a slice of cake. RN educated patient politely to go easy with his carbs and sweets. Patient becomes upset stated "why does it seems like I commit a sin with my food order". RN inform patient that we just want to help him get better and have good control of his blood sugar. Patient needs attended to. Will continue to monitor.patient.

## 2021-01-21 NOTE — Progress Notes (Signed)
Pharmacy Antibiotic Note  Martin [name] is a 55 y.o. male admitted on 01/20/2021 with  osteomyelitis . Pharmacy has been consulted for vancomycin dosing. Pt is afebrile and WBC is WNL. Scr is WNL at 0.67. Pt was on clindamycin PTA without improvement.   Plan: Vanc 2gm IV x 1 then 1500mg  q12 CTX 2gm q24 x total 8 days/MD Flagyl 500mg  po bid  Plan amputation 8/19, likely narrow abx with source control  Height: 5\' 11"  (180.3 cm) Weight: 123.4 kg (272 lb) IBW/kg (Calculated) : 75.3  Temp (24hrs), Avg:98.5 F (36.9 C), Min:97.9 F (36.6 C), Max:98.8 F (37.1 C)  Recent Labs  Lab 01/20/21 1420 01/21/21 0953  WBC 9.5 10.3  CREATININE 0.67 0.68     Estimated Creatinine Clearance: 139.5 mL/min (by C-G formula based on SCr of 0.68 mg/dL).    No Known Allergies  Antimicrobials this admission: 8/17 CTX >> 8/17 Vanc >> 8/18 Flagyl po >>  Dose adjustments this admission: 8/18 Vanc 2gm q12 > 1500mg  q12  Microbiology results: 8/17 BCx: ngtd  Thank you for allowing pharmacy to be a part of this patient's care.  9/18 PharmD 01/21/2021 1:25 PM

## 2021-01-21 NOTE — Consult Note (Signed)
Reason for Consult: Osteomyelitis  Referring Physician:  Dr. Donnald Garre, MD  Martin [name] is an 55 y.o. male.  HPI: 55 year old male with past medical history significant for type 2 diabetes, hypertension, hyperlipidemia presents for worsening right toe infection.  He previously been on 10 days of doxycycline without any improvement he called the office and he was switched to clindamycin on Monday.  Infection has worsened and he was recommended to the emergency department in which she was subsequently admitted for osteomyelitis of the right big toe.  Patient is diabetic but states he does not check his blood sugar at home.  He works 12 hours a day at [employer].  Past Medical History:  Diagnosis Date   Chest pain    Diabetes mellitus    Gout    Hyperlipidemia    Hypertension    Obesity     Past Surgical History:  Procedure Laterality Date   UMBILICAL HERNIA REPAIR      Family History  Problem Relation Age of Onset   Diabetes Maternal Grandfather    Kidney disease Other     Social History:  reports that he has never smoked. His smokeless tobacco use includes chew. He reports current alcohol use. He reports that he does not use drugs.  Allergies: No Known Allergies  Medications: I have reviewed the patient's current medications.  Results for orders placed or performed during the hospital encounter of 01/20/21 (from the past 48 hour(s))  CBG monitoring, ED     Status: Abnormal   Collection Time: 01/20/21  1:25 PM  Result Value Ref Range   Glucose-Capillary 201 (H) 70 - 99 mg/dL    Comment: Glucose reference range applies only to samples taken after fasting for at least 8 hours.   Comment 1 RN AWARE   Basic metabolic panel     Status: Abnormal   Collection Time: 01/20/21  2:20 PM  Result Value Ref Range   Sodium 135 135 - 145 mmol/L   Potassium 4.5 3.5 - 5.1 mmol/L   Chloride 101 98 - 111 mmol/L   CO2 25 22 - 32 mmol/L   Glucose, Bld 156 (H) 70 - 99 mg/dL     Comment: Glucose reference range applies only to samples taken after fasting for at least 8 hours.   BUN 16 6 - 20 mg/dL   Creatinine, Ser 2.95 0.61 - 1.24 mg/dL   Calcium 9.8 8.9 - 62.1 mg/dL   GFR, Estimated >30 >86 mL/min    Comment: (NOTE) Calculated using the CKD-EPI Creatinine Equation (2021)    Anion gap 9 5 - 15    Comment: Performed at [facility], [address] [place], [place] [address]  CBC with Differential     Status: Abnormal   Collection Time: 01/20/21  2:20 PM  Result Value Ref Range   WBC 9.5 4.0 - 10.5 K/uL   RBC 5.12 4.22 - 5.81 MIL/uL   Hemoglobin 14.3 13.0 - 17.0 g/dL   HCT 96.2 95.2 - 84.1 %   MCV 83.8 80.0 - 100.0 fL   MCH 27.9 26.0 - 34.0 pg   MCHC 33.3 30.0 - 36.0 g/dL   RDW 32.4 40.1 - 02.7 %   Platelets 265 150 - 400 K/uL   nRBC 0.0 0.0 - 0.2 %   Neutrophils Relative % 61 %   Neutro Abs 5.7 1.7 - 7.7 K/uL   Lymphocytes Relative 20 %   Lymphs Abs 1.9 0.7 - 4.0 K/uL   Monocytes  Relative 12 %   Monocytes Absolute 1.2 (H) 0.1 - 1.0 K/uL   Eosinophils Relative 7 %   Eosinophils Absolute 0.7 (H) 0.0 - 0.5 K/uL   Basophils Relative 0 %   Basophils Absolute 0.0 0.0 - 0.1 K/uL   Immature Granulocytes 0 %   Abs Immature Granulocytes 0.04 0.00 - 0.07 K/uL    Comment: Performed at [facility], [address], [place], [place] [address]  Sedimentation rate     Status: Abnormal   Collection Time: 01/20/21  2:20 PM  Result Value Ref Range   Sed Rate 62 (H) 0 - 16 mm/hr    Comment: Performed at [facility], [address], [place], [place] [address]  Culture, blood (routine x 2)     Status: None (Preliminary result)   Collection Time: 01/20/21  3:05 PM   Specimen: BLOOD  Result Value Ref Range   Specimen Description BLOOD BLOOD LEFT FOREARM    Special Requests      BOTTLES DRAWN AEROBIC AND ANAEROBIC Blood Culture results may not be optimal due to an excessive volume of blood received in  culture bottles Performed at [facility], [address] [place], [place] [address]    Culture PENDING    Report Status PENDING   Culture, blood (routine x 2)     Status: None (Preliminary result)   Collection Time: 01/20/21  3:05 PM   Specimen: BLOOD  Result Value Ref Range   Specimen Description BLOOD RIGHT ANTECUBITAL    Special Requests      BOTTLES DRAWN AEROBIC AND ANAEROBIC Blood Culture results may not be optimal due to an inadequate volume of blood received in culture bottles Performed at [facility], [address] [place], [place] [address]    Culture PENDING    Report Status PENDING   Resp Panel by RT-PCR (Flu A&B, Covid) Nasopharyngeal Swab     Status: None   Collection Time: 01/20/21  4:50 PM   Specimen: Nasopharyngeal Swab; Nasopharyngeal(NP) swabs in vial transport medium  Result Value Ref Range   SARS Coronavirus 2 by RT PCR NEGATIVE NEGATIVE    Comment: (NOTE) SARS-CoV-2 target nucleic acids are NOT DETECTED.  The SARS-CoV-2 RNA is generally detectable in upper respiratory specimens during the acute phase of infection. The lowest concentration of SARS-CoV-2 viral copies this assay can detect is 138 copies/mL. A negative result does not preclude SARS-Cov-2 infection and should not be used as the sole basis for treatment or other patient management decisions. A negative result may occur with  improper specimen collection/handling, submission of specimen other than nasopharyngeal swab, presence of viral mutation(s) within the areas targeted by this assay, and inadequate number of viral copies(<138 copies/mL). A negative result must be combined with clinical observations, patient history, and epidemiological information. The expected result is Negative.  Fact Sheet for Patients:  BloggerCourse.comhttps://www.fda.gov/media/152166/download  Fact Sheet for Healthcare Providers:  SeriousBroker.ithttps://www.fda.gov/media/152162/download  This test is no t yet approved or cleared  by the [place] FDA and  has been authorized for detection and/or diagnosis of SARS-CoV-2 by FDA under an Emergency Use Authorization (EUA). This EUA will remain  in effect (meaning this test can be used) for the duration of the COVID-19 declaration under Section 564(b)(1) of the Act, 21 U.S.C.section 360bbb-3(b)(1), unless the authorization is terminated  or revoked sooner.       Influenza A by PCR NEGATIVE NEGATIVE   Influenza B by PCR NEGATIVE NEGATIVE  Comment: (NOTE) The Xpert Xpress SARS-CoV-2/FLU/RSV plus assay is intended as an aid in the diagnosis of influenza from Nasopharyngeal swab specimens and should not be used as a sole basis for treatment. Nasal washings and aspirates are unacceptable for Xpert Xpress SARS-CoV-2/FLU/RSV testing.  Fact Sheet for Patients: BloggerCourse.com  Fact Sheet for Healthcare Providers: SeriousBroker.it  This test is not yet approved or cleared by the [place] FDA and has been authorized for detection and/or diagnosis of SARS-CoV-2 by FDA under an Emergency Use Authorization (EUA). This EUA will remain in effect (meaning this test can be used) for the duration of the COVID-19 declaration under Section 564(b)(1) of the Act, 21 U.S.C. section 360bbb-3(b)(1), unless the authorization is terminated or revoked.  Performed at [facility], [address], [place], [place] [address]   Sedimentation rate     Status: Abnormal   Collection Time: 01/20/21  9:11 PM  Result Value Ref Range   Sed Rate 47 (H) 0 - 16 mm/hr    Comment: Performed at [facility], [address] [place], [place] [address]  C-reactive protein     Status: Abnormal   Collection Time: 01/20/21  9:11 PM  Result Value Ref Range   CRP 3.7 (H) <1.0 mg/dL    Comment: Performed at [facility], [address] [place], [place] [address]  Prealbumin      Status: Abnormal   Collection Time: 01/20/21  9:11 PM  Result Value Ref Range   Prealbumin 16.6 (L) 18 - 38 mg/dL    Comment: Performed at [facility], [address], [place], [place] [address]  Glucose, capillary     Status: Abnormal   Collection Time: 01/20/21 11:44 PM  Result Value Ref Range   Glucose-Capillary 127 (H) 70 - 99 mg/dL    Comment: Glucose reference range applies only to samples taken after fasting for at least 8 hours.   Comment 1 Notify RN    Comment 2 Document in Chart   Glucose, capillary     Status: Abnormal   Collection Time: 01/21/21  4:08 AM  Result Value Ref Range   Glucose-Capillary 142 (H) 70 - 99 mg/dL    Comment: Glucose reference range applies only to samples taken after fasting for at least 8 hours.   Comment 1 Notify RN    Comment 2 Document in Chart     DG Toe Great Right  Result Date: 01/20/2021 CLINICAL DATA:  Ulcer. EXAM: RIGHT GREAT TOE COMPARISON:  None. FINDINGS: Lytic destruction is seen involving the distal tuft of the first distal phalanx concerning for osteomyelitis. Overlying soft tissue ulceration is noted. Joint spaces are intact. IMPRESSION: Lytic destruction seen involving distal tuft of first distal phalanx consistent with osteomyelitis. Electronically Signed   By: Lupita Raider M.D.   On: 01/20/2021 14:44    Review of Systems Blood pressure 121/71, pulse 68, temperature 98.7 F (37.1 C), temperature source Oral, resp. rate 18, height 5\' 11"  (1.803 m), weight 123.4 kg, SpO2 95 %. Physical Exam General: AAO x3, NAD  Dermatological: Callus formation at distal aspect of right hallux.  There is edema and erythema present to the hallux extending just proximal to the MPJ.  Mild swelling went to the foot.  There is no fluctuation or crepitation.  Preulcerative area at the distal aspect left big toe without any skin breakdown, erythema or warmth.  Vascular: Dorsalis Pedis artery and Posterior Tibial artery pedal pulses  are 2/4 bilateral  with immedate capillary fill time. There is no pain with calf compression, swelling, warmth, erythema.   Neruologic: Sensation decreased.  Musculoskeletal: Hallux malleus is present.   Assessment/Plan: Osteomyelitis right hallux  Reviewed the x-rays with the patient.  Discussed conservative versus surgical options for osteomyelitis of the toe.  After discussion my recommendation is amputation of the toe at least partial amputation.  We discussed the surgery as well as postoperative course.  Discussed alternatives, risks, complications.  Risks of surgery include, but not limited to, spread of infection, delayed or nonhealing, further amputation as well as general risks of surgery including stroke heart attack, death.  We will plan for surgery tomorrow afternoon.  N.p.o. after midnight.  I am ordering an MRI of the right foot as well to make sure that there is no spread of infection otherwise.   We will need to complete FMLA paperwork for him.  We will do this for him after surgery.   No further questions/concerns.   Vivi Barrack 01/21/2021, 7:59 AM

## 2021-01-21 NOTE — Progress Notes (Signed)
PROGRESS NOTE    Martin [name]  YJE:[id number] DOB: 02-11-1966 DOA: 01/20/2021 PCP: Patient, No Pcp Per (Inactive)   Brief Narrative:  Martin [name] is a 55 y.o. male with medical history significant of DM2, HTN, HLD presented to ED with worsening right toe infection.  He previously had been on 10 days of doxycycline without any improvement, he called the office and was switched to clindamycin on Monday.  Despite of this infection got worse and so he came to the ED.  X-ray shows osteomyelitis.  Admitted with IV antibiotics and podiatry consulted.  Assessment & Plan:   Active Problems:   Essential hypertension, benign   Type 2 diabetes mellitus (HCC)   Hypertension   Osteomyelitis (HCC)  Right great toe osteomyelitis: Podiatry on board.  MRI pending.  Possible plan of amputation tomorrow.  Continue antibiotics.  Type 2 diabetes mellitus: Takes 160 units of Lantus, metformin at home.  Currently on 130 units of Lantus and SSI.  We will continue that.  Blood sugar fairly controlled but slightly labile.  Hyperlipidemia: Continue Lipitor.  Essential hypertension: Controlled.  Resume lisinopril.  DVT prophylaxis: SCD's Start: 01/21/21 0916 SCDs Start: 01/20/21 2057   Code Status: Full Code  Family Communication:  wife present at bedside.  Plan of care discussed with patient in length and he verbalized understanding and agreed with it.  Status is: Inpatient  Remains inpatient appropriate because:Inpatient level of care appropriate due to severity of illness  Dispo: The patient is from: Home              Anticipated d/c is to: Home              Patient currently is not medically stable to d/c.   Difficult to place patient No        Estimated body mass index is 37.94 kg/m as calculated from the following:   Height as of this encounter: 5\' 11"  (1.803 m).   Weight as of this encounter: 123.4 kg.     Nutritional Assessment: Body mass index is 37.94 kg/m. Seen by  dietician.  I agree with the assessment and plan as outlined below: Nutrition Status: Nutrition Problem: Increased nutrient needs Etiology: wound healing Signs/Symptoms: estimated needs Interventions: Education, MVI  .  Skin Assessment: I have examined the patient's skin and I agree with the wound assessment as performed by the wound care RN as outlined below:    Consultants:  Podiatry  Procedures:  None  Antimicrobials:  Anti-infectives (From admission, onward)    Start     Dose/Rate Route Frequency Ordered Stop   01/21/21 1500  cefTRIAXone (ROCEPHIN) 2 g in sodium chloride 0.9 % 100 mL IVPB        2 g 200 mL/hr over 30 Minutes Intravenous Every 24 hours 01/20/21 2057 01/28/21 1459   01/21/21 0500  vancomycin (VANCOREADY) IVPB 2000 mg/400 mL        2,000 mg 200 mL/hr over 120 Minutes Intravenous Every 12 hours 01/20/21 1514     01/20/21 2200  metroNIDAZOLE (FLAGYL) tablet 500 mg        500 mg Oral 2 times daily 01/20/21 2057 01/27/21 2159   01/20/21 1630  vancomycin (VANCOCIN) IVPB 1000 mg/200 mL premix        1,000 mg 200 mL/hr over 60 Minutes Intravenous  Once 01/20/21 1500 01/20/21 1912   01/20/21 1515  vancomycin (VANCOCIN) IVPB 1000 mg/200 mL premix        1,000 mg 200 mL/hr  over 60 Minutes Intravenous  Once 01/20/21 1500 01/20/21 1801   01/20/21 1500  cefTRIAXone (ROCEPHIN) 2 g in sodium chloride 0.9 % 100 mL IVPB        2 g 200 mL/hr over 30 Minutes Intravenous  Once 01/20/21 1449 01/20/21 1641          Subjective: Seen and examined.  No complaints.  Wife at the bedside.  Objective: Vitals:   01/20/21 2009 01/20/21 2343 01/21/21 0403 01/21/21 0817  BP: (!) 142/82 113/64 121/71 127/78  Pulse: 91 84 68 63  Resp: 15 15 18 14   Temp: 98.8 F (37.1 C) 98.7 F (37.1 C) 98.7 F (37.1 C) 97.9 F (36.6 C)  TempSrc: Oral Oral Oral Oral  SpO2: 99% 95%    Weight:      Height:        Intake/Output Summary (Last 24 hours) at 01/21/2021 1252 Last data filed  at 01/21/2021 0900 Gross per 24 hour  Intake 480 ml  Output --  Net 480 ml   Filed Weights   01/20/21 1320  Weight: 123.4 kg    Examination:  General exam: Appears calm and comfortable  Respiratory system: Clear to auscultation. Respiratory effort normal. Cardiovascular system: S1 & S2 heard, RRR. No JVD, murmurs, rubs, gallops or clicks. No pedal edema. Gastrointestinal system: Abdomen is nondistended, soft and nontender. No organomegaly or masses felt. Normal bowel sounds heard. Central nervous system: Alert and oriented. No focal neurological deficits. Extremities: Edematous and necrotic great toe on the right foot. Skin: No rashes, lesions or ulcers Psychiatry: Judgement and insight appear normal. Mood & affect appropriate.    Data Reviewed: I have personally reviewed following labs and imaging studies  CBC: Recent Labs  Lab 01/20/21 1420 01/21/21 0953  WBC 9.5 10.3  NEUTROABS 5.7 7.1  HGB 14.3 13.9  HCT 42.9 43.1  MCV 83.8 87.4  PLT 265 282   Basic Metabolic Panel: Recent Labs  Lab 01/20/21 1420 01/21/21 0953  NA 135 138  K 4.5 4.1  CL 101 105  CO2 25 26  GLUCOSE 156* 134*  BUN 16 16  CREATININE 0.67 0.68  CALCIUM 9.8 9.4  MG  --  1.7  PHOS  --  3.6   GFR: Estimated Creatinine Clearance: 139.5 mL/min (by C-G formula based on SCr of 0.68 mg/dL). Liver Function Tests: Recent Labs  Lab 01/21/21 0953  AST 19  ALT 22  ALKPHOS 80  BILITOT 0.5  PROT 7.1  ALBUMIN 3.0*   No results for input(s): LIPASE, AMYLASE in the last 168 hours. No results for input(s): AMMONIA in the last 168 hours. Coagulation Profile: No results for input(s): INR, PROTIME in the last 168 hours. Cardiac Enzymes: No results for input(s): CKTOTAL, CKMB, CKMBINDEX, TROPONINI in the last 168 hours. BNP (last 3 results) No results for input(s): PROBNP in the last 8760 hours. HbA1C: Recent Labs    01/21/21 0953  HGBA1C 11.1*   CBG: Recent Labs  Lab 01/20/21 2057  01/20/21 2344 01/21/21 0408 01/21/21 0753 01/21/21 1115  GLUCAP 105* 127* 142* 93 189*   Lipid Profile: No results for input(s): CHOL, HDL, LDLCALC, TRIG, CHOLHDL, LDLDIRECT in the last 72 hours. Thyroid Function Tests: Recent Labs    01/21/21 0953  TSH 4.587*   Anemia Panel: No results for input(s): VITAMINB12, FOLATE, FERRITIN, TIBC, IRON, RETICCTPCT in the last 72 hours. Sepsis Labs: No results for input(s): PROCALCITON, LATICACIDVEN in the last 168 hours.  Recent Results (from the past 240 hour(s))  Culture, blood (routine x 2)     Status: None (Preliminary result)   Collection Time: 01/20/21  3:05 PM   Specimen: BLOOD  Result Value Ref Range Status   Specimen Description BLOOD BLOOD LEFT FOREARM  Final   Special Requests   Final    BOTTLES DRAWN AEROBIC AND ANAEROBIC Blood Culture results may not be optimal due to an excessive volume of blood received in culture bottles   Culture   Final    NO GROWTH < 24 HOURS Performed at [facility], [address] [place], [place] [address]    Report Status PENDING  Incomplete  Culture, blood (routine x 2)     Status: None (Preliminary result)   Collection Time: 01/20/21  3:05 PM   Specimen: BLOOD  Result Value Ref Range Status   Specimen Description BLOOD RIGHT ANTECUBITAL  Final   Special Requests   Final    BOTTLES DRAWN AEROBIC AND ANAEROBIC Blood Culture results may not be optimal due to an inadequate volume of blood received in culture bottles   Culture   Final    NO GROWTH < 24 HOURS Performed at [facility], [address] [place], [place] [address]    Report Status PENDING  Incomplete  Resp Panel by RT-PCR (Flu A&B, Covid) Nasopharyngeal Swab     Status: None   Collection Time: 01/20/21  4:50 PM   Specimen: Nasopharyngeal Swab; Nasopharyngeal(NP) swabs in vial transport medium  Result Value Ref Range Status   SARS Coronavirus 2 by RT PCR NEGATIVE NEGATIVE Final    Comment: (NOTE) SARS-CoV-2 target  nucleic acids are NOT DETECTED.  The SARS-CoV-2 RNA is generally detectable in upper respiratory specimens during the acute phase of infection. The lowest concentration of SARS-CoV-2 viral copies this assay can detect is 138 copies/mL. A negative result does not preclude SARS-Cov-2 infection and should not be used as the sole basis for treatment or other patient management decisions. A negative result may occur with  improper specimen collection/handling, submission of specimen other than nasopharyngeal swab, presence of viral mutation(s) within the areas targeted by this assay, and inadequate number of viral copies(<138 copies/mL). A negative result must be combined with clinical observations, patient history, and epidemiological information. The expected result is Negative.  Fact Sheet for Patients:  BloggerCourse.com  Fact Sheet for Healthcare Providers:  SeriousBroker.it  This test is no t yet approved or cleared by the [place] FDA and  has been authorized for detection and/or diagnosis of SARS-CoV-2 by FDA under an Emergency Use Authorization (EUA). This EUA will remain  in effect (meaning this test can be used) for the duration of the COVID-19 declaration under Section 564(b)(1) of the Act, 21 U.S.C.section 360bbb-3(b)(1), unless the authorization is terminated  or revoked sooner.       Influenza A by PCR NEGATIVE NEGATIVE Final   Influenza B by PCR NEGATIVE NEGATIVE Final    Comment: (NOTE) The Xpert Xpress SARS-CoV-2/FLU/RSV plus assay is intended as an aid in the diagnosis of influenza from Nasopharyngeal swab specimens and should not be used as a sole basis for treatment. Nasal washings and aspirates are unacceptable for Xpert Xpress SARS-CoV-2/FLU/RSV testing.  Fact Sheet for Patients: BloggerCourse.com  Fact Sheet for Healthcare  Providers: SeriousBroker.it  This test is not yet approved or cleared by the [place] FDA and has been authorized for detection and/or diagnosis of SARS-CoV-2 by FDA under an Emergency Use Authorization (EUA). This EUA will remain in effect (  meaning this test can be used) for the duration of the COVID-19 declaration under Section 564(b)(1) of the Act, 21 U.S.C. section 360bbb-3(b)(1), unless the authorization is terminated or revoked.  Performed at [facility], [address], [place], [place] [address]       Radiology Studies: DG Toe Great Right  Result Date: 01/20/2021 CLINICAL DATA:  Ulcer. EXAM: RIGHT GREAT TOE COMPARISON:  None. FINDINGS: Lytic destruction is seen involving the distal tuft of the first distal phalanx concerning for osteomyelitis. Overlying soft tissue ulceration is noted. Joint spaces are intact. IMPRESSION: Lytic destruction seen involving distal tuft of first distal phalanx consistent with osteomyelitis. Electronically Signed   By: Lupita RaiderJames  Green Jr M.D.   On: 01/20/2021 14:44   VAS US ABI WITH/WO TBI  Result Date: 01/21/2021  LOWER EXTREMITY DOPPLER STUDY Patient Name:  Martin [name]  Date of Exam:   01/21/2021 Medical Rec #: [id number]         Accession #:    [id number] Date of Birth: 05-30-1966         Patient Gender: M Patient Age:   [age] years [facility]am [facility] Procedure:      VAS US ABI WITH/WO TBI Referring Phys: Jonny RuizANASTASSIA DOUTOVA --------------------------------------------------------------------------------  Indications: Ulceration. High Risk Factors: Hypertension, Diabetes.  Limitations: Today's exam was limited due to an open wound and involuntary              patient movement. Comparison Study: No prior studies. Performing Technologist: Olen Cordialollins, Greg RVT  Examination Guidelines: A complete evaluation includes at minimum, Doppler waveform signals and systolic blood pressure reading at the  level of bilateral brachial, anterior tibial, and posterior tibial arteries, when vessel segments are accessible. Bilateral testing is considered an integral part of a complete examination. Photoelectric Plethysmograph (PPG) waveforms and toe systolic pressure readings are included as required and additional duplex testing as needed. Limited examinations for reoccurring indications may be performed as noted.  ABI Findings: +---------+------------------+-----+---------+--------+ Right    Rt Pressure (mmHg)IndexWaveform Comment  +---------+------------------+-----+---------+--------+ Brachial 126                    triphasic         +---------+------------------+-----+---------+--------+ PTA      138               1.10 triphasic         +---------+------------------+-----+---------+--------+ DP       141               1.12 triphasic         +---------+------------------+-----+---------+--------+ Great Toe                                Ulcer    +---------+------------------+-----+---------+--------+ +---------+------------------+-----+---------+-------+ Left     Lt Pressure (mmHg)IndexWaveform Comment +---------+------------------+-----+---------+-------+ Brachial 109                    triphasic        +---------+------------------+-----+---------+-------+ PTA      138               1.10 triphasic        +---------+------------------+-----+---------+-------+ DP       131               1.04 triphasic        +---------+------------------+-----+---------+-------+ Lilli LightGreat Toe94  0.75                  +---------+------------------+-----+---------+-------+ +-------+-----------+-----------+------------+------------+ ABI/TBIToday's ABIToday's TBIPrevious ABIPrevious TBI +-------+-----------+-----------+------------+------------+ Right  1.12                                           +-------+-----------+-----------+------------+------------+  Left   1.1        0.75                                +-------+-----------+-----------+------------+------------+  Summary: Right: Resting right ankle-brachial index is within normal range. No evidence of significant right lower extremity arterial disease. Unable to obtain TBI due to great toe ulceration. Left: Resting left ankle-brachial index is within normal range. No evidence of significant left lower extremity arterial disease. The left toe-brachial index is normal.  *See table(s) above for measurements and observations.     Preliminary     Scheduled Meds:  atorvastatin  80 mg Oral QHS   Chlorhexidine Gluconate Cloth  6 each Topical Once   And   Chlorhexidine Gluconate Cloth  6 each Topical Once   insulin aspart  0-9 Units Subcutaneous Q4H   insulin glargine-yfgn  130 Units Subcutaneous QHS   metroNIDAZOLE  500 mg Oral BID   nicotine  21 mg Transdermal Daily   sodium chloride flush  3 mL Intravenous Q12H   Continuous Infusions:  sodium chloride     cefTRIAXone (ROCEPHIN)  IV     vancomycin 2,000 mg (01/21/21 0549)     LOS: 1 day   Time spent: 35 minutes   Hughie Closs, MD [facility]  01/21/2021, 12:52 PM   How to contact the [facility] Attending or Consulting provider 7A - 7P or covering provider during after hours 7P -7A, for this patient?  Check the care team in [facility] and look for a) attending/consulting TR[facility] provider listed and b) the [facility] team listed. Page or secure chat 7A-7P. Log into [email or web address] and use [facility] universal password to access. If you do not have the password, please contact the hospital operator. Locate the [facility] provider you are looking for under [facility] and page to a number that you can be directly reached. If you still have difficulty reaching the provider, please page the [facility] (Director on Call) for the Hospitalists listed on amion for assistance.

## 2021-01-21 NOTE — Progress Notes (Addendum)
Inpatient Diabetes Program Recommendations  AACE/ADA: New Consensus Statement on Inpatient Glycemic Control (2015)  Target Ranges:  Prepandial:   less than 140 mg/dL      Peak postprandial:   less than 180 mg/dL (1-2 hours)      Critically ill patients:  140 - 180 mg/dL   Lab Results  Component Value Date   GLUCAP 93 01/21/2021   HGBA1C 9.5 10/16/2015    Review of Glycemic Control  Diabetes history: DM2 Outpatient Diabetes medications: Tresiba 165 units QD, metformin 1000 mg BID, Jardiance 25 mg QD Current orders for Inpatient glycemic control: Semglee 130 units QHS, Novolog 0-9 units Q4H  HgbA1C pending. 142, 93 mg/this am  Inpatient Diabetes dL Program Recommendations:    Consider splitting Semglee dose to 65 units BID, for better absorption.  Pt needs PCP. Will talk with pt when HgbA1C results are back.   Follow glucose trends.  Thank you. Ailene Ards, RD, LDN, CDE Inpatient Diabetes Coordinator [phone]   Addendum: Spoke with pt at bedside regarding HgbA1C of 11.1% (average blood sugar 272 mg/dL each day) Pt states his blood sugars are always below 200 when he checks it. Sees Dr Sharl Ma for Endo. Pt's HgbA1C has increased from 9% to 11.1%. Very upset about toe amputation on 8/19. We discussed importance of checking blood sugars several times/day, not just first thing in the morning. Said he's had diabetes for a long time and knows what to do. When discussing his food intake, pt got upset and said "I work a 12H shift 5 days/week at [employer]." Discussed impact of nutrition, exercise, stress, sickness, and medications on diabetes control. Pt appreciative of visit. Will f/u before discharge.   Thank you. Ailene Ards, RD, LDN, CDE Inpatient Diabetes Coordinator [phone]

## 2021-01-21 NOTE — Progress Notes (Signed)
OT Cancellation Note  Patient Details Name: Martin [name] MRN: [id number] DOB: May 26, 1966   Cancelled Treatment:    Reason Eval/Treat Not Completed: Patient not medically ready patient has osteo of right great toe with possible surgery on 01/22/21. Will continue to follow for post op orders.   Sharyn Blitz OTR/L, MS Acute Rehabilitation Department Office# [phone] Pager# [phone]   [name] 01/21/2021, 7:45 AM

## 2021-01-21 NOTE — Progress Notes (Signed)
PT Cancellation Note  Patient Details Name: ZIYAD DYAR MRN: [id number] DOB: 1966-03-14   Cancelled Treatment:    Reason Eval/Treat Not Completed: Patient not medically ready, osteo of right great toe, for possible surgery 01/22/21. Will follow for post op orders.   Rada Hay 01/21/2021, 7:14 AM   Blanchard Kelch PT Acute Rehabilitation Services Pager [phone] Office [phone]

## 2021-01-21 NOTE — Progress Notes (Signed)
Spoke with radiology as patient is pending MRI for surgery tomorrow. He is next after the STAT imaging is complete.

## 2021-01-22 ENCOUNTER — Encounter: Payer: Self-pay | Admitting: Podiatry

## 2021-01-22 DIAGNOSIS — M86671 Other chronic osteomyelitis, right ankle and foot: Secondary | ICD-10-CM

## 2021-01-22 LAB — BASIC METABOLIC PANEL
Anion gap: 8 (ref 5–15)
BUN: 16 mg/dL (ref 6–20)
CO2: 24 mmol/L (ref 22–32)
Calcium: 9.6 mg/dL (ref 8.9–10.3)
Chloride: 107 mmol/L (ref 98–111)
Creatinine, Ser: 0.79 mg/dL (ref 0.61–1.24)
GFR, Estimated: >60 mL/min (ref >60)
Glucose, Bld: 77 mg/dL (ref 70–99)
Potassium: 3.4 mmol/L — ABNORMAL LOW (ref 3.5–5.1)
Sodium: 139 mmol/L (ref 135–145)

## 2021-01-22 LAB — GLUCOSE, CAPILLARY
Glucose-Capillary: 107 mg/dL — ABNORMAL HIGH (ref 70–99)
Glucose-Capillary: 123 mg/dL — ABNORMAL HIGH (ref 70–99)
Glucose-Capillary: 125 mg/dL — ABNORMAL HIGH (ref 70–99)
Glucose-Capillary: 76 mg/dL (ref 70–99)
Glucose-Capillary: 84 mg/dL (ref 70–99)
Glucose-Capillary: 99 mg/dL (ref 70–99)

## 2021-01-22 MED ORDER — INSULIN GLARGINE-YFGN 100 UNIT/ML ~~LOC~~ SOLN
80.0000 [IU] | Freq: Every day | SUBCUTANEOUS | Status: DC
  Administered 2021-01-22: 80 [IU] via SUBCUTANEOUS
  Filled 2021-01-22: qty 0.8

## 2021-01-22 MED ORDER — POTASSIUM CHLORIDE CRYS ER 10 MEQ PO TBCR
40.0000 meq | EXTENDED_RELEASE_TABLET | Freq: Once | ORAL | Status: DC
  Filled 2021-01-22: qty 4

## 2021-01-22 MED ORDER — POTASSIUM CHLORIDE 10 MEQ/100ML IV SOLN
10.0000 meq | INTRAVENOUS | Status: DC
  Administered 2021-01-22: 10 meq via INTRAVENOUS
  Filled 2021-01-22: qty 100

## 2021-01-22 NOTE — Progress Notes (Signed)
Pt. Complained of petechia at left anterior foot. Pt.denied pain and itchiness. MD was notified. Will monitor closely.

## 2021-01-22 NOTE — Evaluation (Signed)
Physical Therapy Evaluation Patient Details Name: Martin [name] MRN: [id number] DOB: 1965/07/26 Today's Date: 01/22/2021   History of Present Illness  55 y.o. male with medical history significant of DM2, HTN, HLD presented to ED with worsening right toe infection.  X-ray shows osteomyelitis. pt is scheduled for R great toe amp 8/20  Clinical Impression  Pt admitted with above diagnosis.  Pt seen today for mobility and pre-op education regarding possible changes in balance with amp of great toe, post op shoe that may also result in changes in gait pattern and balance. Pt is mobilizing well today, he has been up in the room on his own. Will follow for post-op assessment and update as needed.   Pt currently with functional limitations due to the deficits listed below (see PT Problem List). Pt will benefit from skilled PT to increase their independence and safety with mobility to allow discharge to the venue listed below.       Follow Up Recommendations No PT follow up    Equipment Recommendations  Other (comment) (TBD)    Recommendations for Other Services       Precautions / Restrictions Precautions Precautions: None Required Braces or Orthoses: Other Brace Other Brace: discussed pt may have post op shoe after surgery Restrictions Weight Bearing Restrictions: No      Mobility  Bed Mobility Overal bed mobility: Modified Independent                  Transfers Overall transfer level: Modified independent                  Ambulation/Gait Ambulation/Gait assistance: Supervision;Min guard Gait Distance (Feet): 180 Feet Assistive device: None Gait Pattern/deviations: Wide base of support;Decreased stance time - right     General Gait Details: maintains RLE in external rotation, push off avoidance d/t decr sensation R foot/great toe. no LOB with compensatory strategies in place  Stairs            Wheelchair Mobility    Modified Rankin (Stroke  Patients Only)       Balance Overall balance assessment: Mild deficits observed, not formally tested                                           Pertinent Vitals/Pain Pain Assessment: No/denies pain    Home Living Family/patient expects to be discharged to:: Private residence Living Arrangements: Spouse/significant other   Type of Home: House Home Access: Stairs to enter   Secretary/administrator of Steps: 3 Home Layout: One level Home Equipment: None      Prior Function Level of Independence: Independent         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, adult education        Extremity/Trunk Assessment   Upper Extremity Assessment Upper Extremity Assessment: Overall WFL for tasks assessed    Lower Extremity Assessment Lower Extremity Assessment: Overall WFL for tasks assessed       Communication   Communication: No difficulties  Cognition Arousal/Alertness: Awake/alert Behavior During Therapy: WFL for tasks assessed/performed Overall Cognitive Status: Within Functional Limits for tasks assessed                                        General Comments  Exercises     Assessment/Plan    PT Assessment Patient needs continued PT services  PT Problem List Decreased knowledge of use of DME;Impaired sensation       PT Treatment Interventions Therapeutic activities;Gait training;Functional mobility training;Therapeutic exercise;Patient/family education;Stair training;DME instruction    PT Goals (Current goals can be found in the Care Plan section)  Acute Rehab PT Goals Patient Stated Goal: home Monday PT Goal Formulation: With patient Time For Goal Achievement: 01/29/21 Potential to Achieve Goals: Good    Frequency Min 3X/week   Barriers to discharge        Co-evaluation               AM-PAC PT "6 Clicks" Mobility  Outcome Measure Help needed turning from your back to your side while in a flat bed  without using bedrails?: None Help needed moving from lying on your back to sitting on the side of a flat bed without using bedrails?: None Help needed moving to and from a bed to a chair (including a wheelchair)?: None Help needed standing up from a chair using your arms (e.g., wheelchair or bedside chair)?: A Little Help needed to walk in hospital room?: A Little Help needed climbing 3-5 steps with a railing? : A Little 6 Click Score: 21    End of Session   Activity Tolerance: Patient tolerated treatment well Patient left: in bed;with call bell/phone within reach   PT Visit Diagnosis: Difficulty in walking, not elsewhere classified (R26.2)    Time: 0865-7846 PT Time Calculation (min) (ACUTE ONLY): 24 min   Charges:   PT Evaluation $PT Eval Low Complexity: 1 Low PT Treatments $Gait Training: 8-22 mins        Delice Bison, PT  Acute Rehab Dept ([facility]) [phone] Pager [phone]  01/22/2021   [facility] 01/22/2021, 3:59 PM

## 2021-01-22 NOTE — Progress Notes (Signed)
Inpatient Diabetes Program Recommendations  AACE/ADA: New Consensus Statement on Inpatient Glycemic Control (2015)  Target Ranges:  Prepandial:   less than 140 mg/dL      Peak postprandial:   less than 180 mg/dL (1-2 hours)      Critically ill patients:  140 - 180 mg/dL   Lab Results  Component Value Date   GLUCAP 76 01/22/2021   HGBA1C 11.1 (H) 01/21/2021    Review of Glycemic Control  Diabetes history: DM2 Outpatient Diabetes medications: Tresiba 165 units QD, metformin 1000 mg BID, Jardiance 25 mg QD Current orders for Inpatient glycemic control: Semglee 130 units QHS, Novolog 0-9 units Q4H  Inpatient Diabetes Program Recommendations:   Noted patient's fasting CBG today was 76. Please consider: -Decrease Semglee to 100 units q hs. Secure chat sent to Dr. Jacqulyn Bath.  Thank you, Billy Fischer. Ambriella Kitt, RN, MSN, CDE  Diabetes Coordinator Inpatient Glycemic Control Team Team Pager [phone] (8am-5pm) 01/22/2021 2:27 PM

## 2021-01-22 NOTE — Progress Notes (Addendum)
PROGRESS NOTE    Martin [name]  VPX:[id number] DOB: 03-22-1966 DOA: 01/20/2021 PCP: Patient, No Pcp Per (Inactive)   Brief Narrative:  Martin [name] is a 55 y.o. male with medical history significant of DM2, HTN, HLD presented to ED with worsening right toe infection.  Martin [name] previously had been on 10 days of doxycycline without any improvement, Martin [name] called the office and was switched to clindamycin on Monday.  Despite of this infection got worse and so Martin [name] came to the ED.  X-ray shows osteomyelitis.  Admitted with IV antibiotics and podiatry consulted.  Assessment & Plan:   Active Problems:   Essential hypertension, benign   Type 2 diabetes mellitus (HCC)   Hypertension   Osteomyelitis (HCC)  Right great toe osteomyelitis: Podiatry on board.  MRI and first distal phalanx osteomyelitis with overlying cellulitis.  Patient was a scheduled to have surgery today with podiatry however due to overtime conflict, it is now rescheduled for tomorrow at 7:30 AM.  Appreciate podiatry help.  Continue antibiotics.  Type 2 diabetes mellitus: Takes 160 units of Lantus, metformin at home.  Currently on 130 units of Lantus and SSI and blood sugar controlled so we will continue current regimen.  Hypokalemia: 3.4.  Replace.  Recheck in the morning  Hyperlipidemia: Continue Lipitor.  Essential hypertension: Controlled.  Continue lisinopril  DVT prophylaxis: SCD's Start: 01/21/21 0916 SCDs Start: 01/20/21 2057   Code Status: Full Code  Family Communication: None present at bedside.  Plan of care discussed with patient in length and Martin [name] verbalized understanding and agreed with it.  Status is: Inpatient  Remains inpatient appropriate because:Inpatient level of care appropriate due to severity of illness  Dispo: The patient is from: Home              Anticipated d/c is to: Home              Patient currently is not medically stable to d/c.   Difficult to place patient No        Estimated body  mass index is 37.94 kg/m as calculated from the following:   Height as of this encounter: 5\' 11"  (1.803 m).   Weight as of this encounter: 123.4 kg.     Nutritional Assessment: Body mass index is 37.94 kg/m. Seen by dietician.  I agree with the assessment and plan as outlined below: Nutrition Status: Nutrition Problem: Increased nutrient needs Etiology: wound healing Signs/Symptoms: estimated needs Interventions: Education, MVI  .  Skin Assessment: I have examined the patient's skin and I agree with the wound assessment as performed by the wound care RN as outlined below:    Consultants:  Podiatry  Procedures:  None  Antimicrobials:  Anti-infectives (From admission, onward)    Start     Dose/Rate Route Frequency Ordered Stop   01/21/21 1800  vancomycin (VANCOREADY) IVPB 1500 mg/300 mL        1,500 mg 150 mL/hr over 120 Minutes Intravenous Every 12 hours 01/21/21 1323     01/21/21 1500  cefTRIAXone (ROCEPHIN) 2 g in sodium chloride 0.9 % 100 mL IVPB        2 g 200 mL/hr over 30 Minutes Intravenous Every 24 hours 01/20/21 2057 01/28/21 1459   01/21/21 0500  vancomycin (VANCOREADY) IVPB 2000 mg/400 mL  Status:  Discontinued        2,000 mg 200 mL/hr over 120 Minutes Intravenous Every 12 hours 01/20/21 1514 01/21/21 1323   01/20/21 2200  metroNIDAZOLE (FLAGYL) tablet 500 mg  500 mg Oral 2 times daily 01/20/21 2057 01/27/21 2159   01/20/21 1630  vancomycin (VANCOCIN) IVPB 1000 mg/200 mL premix        1,000 mg 200 mL/hr over 60 Minutes Intravenous  Once 01/20/21 1500 01/20/21 1912   01/20/21 1515  vancomycin (VANCOCIN) IVPB 1000 mg/200 mL premix        1,000 mg 200 mL/hr over 60 Minutes Intravenous  Once 01/20/21 1500 01/20/21 1801   01/20/21 1500  cefTRIAXone (ROCEPHIN) 2 g in sodium chloride 0.9 % 100 mL IVPB        2 g 200 mL/hr over 30 Minutes Intravenous  Once 01/20/21 1449 01/20/21 1641          Subjective: In and examined.  Martin [name] has no  complaints.  Objective: Vitals:   01/21/21 0817 01/21/21 1403 01/21/21 2001 01/22/21 0418  BP: 127/78 133/75 (!) 141/86 121/73  Pulse: 63 71 76 67  Resp: Temp: 97.9 F (36.6 C) (!) 97.1 F (36.2 C) 97.6 F (36.4 C) 97.7 F (36.5 C)  TempSrc: Oral  Oral Oral  SpO2:  96% 99% 95%  Weight:      Height:        Intake/Output Summary (Last 24 hours) at 01/22/2021 1042 Last data filed at 01/22/2021 0954 Gross per 24 hour  Intake 1288.7 ml  Output --  Net 1288.7 ml    Filed Weights   01/20/21 1320  Weight: 123.4 kg    Examination:  General exam: Appears calm and comfortable  Respiratory system: Clear to auscultation. Respiratory effort normal. Cardiovascular system: S1 & S2 heard, RRR. No JVD, murmurs, rubs, gallops or clicks. No pedal edema. Gastrointestinal system: Abdomen is nondistended, soft and nontender. No organomegaly or masses felt. Normal bowel sounds heard. Central nervous system: Alert and oriented. No focal neurological deficits. Extremities: Symmetric 5 x 5 power. Skin: Edematous and necrotic right great toe with surrounding erythema Psychiatry: Judgement and insight appear normal. Mood & affect appropriate.    Data Reviewed: I have personally reviewed following labs and imaging studies  CBC: Recent Labs  Lab 01/20/21 1420 01/21/21 0953  WBC 9.5 10.3  NEUTROABS 5.7 7.1  HGB 14.3 13.9  HCT 42.9 43.1  MCV 83.8 87.4  PLT 265 282    Basic Metabolic Panel: Recent Labs  Lab 01/20/21 1420 01/21/21 0953 01/22/21 0521  NA 135 138 139  K 4.5 4.1 3.4*  CL 101 105 107  CO2 GLUCOSE 156* 134* 77  BUN CREATININE 0.67 0.68 0.79  CALCIUM 9.8 9.4 9.6  MG  --  1.7  --   PHOS  --  3.6  --     GFR: Estimated Creatinine Clearance: 139.5 mL/min (by C-G formula based on SCr of 0.79 mg/dL). Liver Function Tests: Recent Labs  Lab 01/21/21 0953  AST 19  ALT 22  ALKPHOS 80  BILITOT 0.5  PROT 7.1  ALBUMIN 3.0*    No  results for input(s): LIPASE, AMYLASE in the last 168 hours. No results for input(s): AMMONIA in the last 168 hours. Coagulation Profile: No results for input(s): INR, PROTIME in the last 168 hours. Cardiac Enzymes: No results for input(s): CKTOTAL, CKMB, CKMBINDEX, TROPONINI in the last 168 hours. BNP (last 3 results) No results for input(s): PROBNP in the last 8760 hours. HbA1C: Recent Labs    01/21/21 0953  HGBA1C 11.1*    CBG: Recent Labs  Lab 01/21/21 1604 01/21/21 2005  01/21/21 2359 01/22/21 0422 01/22/21 0734  GLUCAP 133* 141* 125* 84 76    Lipid Profile: No results for input(s): CHOL, HDL, LDLCALC, TRIG, CHOLHDL, LDLDIRECT in the last 72 hours. Thyroid Function Tests: Recent Labs    01/21/21 0953  TSH 4.587*    Anemia Panel: No results for input(s): VITAMINB12, FOLATE, FERRITIN, TIBC, IRON, RETICCTPCT in the last 72 hours. Sepsis Labs: No results for input(s): PROCALCITON, LATICACIDVEN in the last 168 hours.  Recent Results (from the past 240 hour(s))  Culture, blood (routine x 2)     Status: None (Preliminary result)   Collection Time: 01/20/21  3:05 PM   Specimen: BLOOD  Result Value Ref Range Status   Specimen Description BLOOD BLOOD LEFT FOREARM  Final   Special Requests   Final    BOTTLES DRAWN AEROBIC AND ANAEROBIC Blood Culture results may not be optimal due to an excessive volume of blood received in culture bottles   Culture   Final    NO GROWTH 2 DAYS Performed at [facility], [address] [place], [place] [address]    Report Status PENDING  Incomplete  Culture, blood (routine x 2)     Status: None (Preliminary result)   Collection Time: 01/20/21  3:05 PM   Specimen: BLOOD  Result Value Ref Range Status   Specimen Description BLOOD RIGHT ANTECUBITAL  Final   Special Requests   Final    BOTTLES DRAWN AEROBIC AND ANAEROBIC Blood Culture results may not be optimal due to an inadequate volume of blood received in culture bottles    Culture   Final    NO GROWTH 2 DAYS Performed at [facility], [address] [place], [place] [address]    Report Status PENDING  Incomplete  Resp Panel by RT-PCR (Flu A&B, Covid) Nasopharyngeal Swab     Status: None   Collection Time: 01/20/21  4:50 PM   Specimen: Nasopharyngeal Swab; Nasopharyngeal(NP) swabs in vial transport medium  Result Value Ref Range Status   SARS Coronavirus 2 by RT PCR NEGATIVE NEGATIVE Final    Comment: (NOTE) SARS-CoV-2 target nucleic acids are NOT DETECTED.  The SARS-CoV-2 RNA is generally detectable in upper respiratory specimens during the acute phase of infection. The lowest concentration of SARS-CoV-2 viral copies this assay can detect is 138 copies/mL. A negative result does not preclude SARS-Cov-2 infection and should not be used as the sole basis for treatment or other patient management decisions. A negative result may occur with  improper specimen collection/handling, submission of specimen other than nasopharyngeal swab, presence of viral mutation(s) within the areas targeted by this assay, and inadequate number of viral copies(<138 copies/mL). A negative result must be combined with clinical observations, patient history, and epidemiological information. The expected result is Negative.  Fact Sheet for Patients:  BloggerCourse.comhttps://www.fda.gov/media/152166/download  Fact Sheet for Healthcare Providers:  SeriousBroker.ithttps://www.fda.gov/media/152162/download  This test is no t yet approved or cleared by the [place] FDA and  has been authorized for detection and/or diagnosis of SARS-CoV-2 by FDA under an Emergency Use Authorization (EUA). This EUA will remain  in effect (meaning this test can be used) for the duration of the COVID-19 declaration under Section 564(b)(1) of the Act, 21 U.S.C.section 360bbb-3(b)(1), unless the authorization is terminated  or revoked sooner.       Influenza A by PCR NEGATIVE NEGATIVE Final   Influenza B by PCR  NEGATIVE NEGATIVE Final    Comment: (NOTE) The Xpert Xpress SARS-CoV-2/FLU/RSV plus assay is  intended as an aid in the diagnosis of influenza from Nasopharyngeal swab specimens and should not be used as a sole basis for treatment. Nasal washings and aspirates are unacceptable for Xpert Xpress SARS-CoV-2/FLU/RSV testing.  Fact Sheet for Patients: BloggerCourse.com  Fact Sheet for Healthcare Providers: SeriousBroker.it  This test is not yet approved or cleared by the [place] FDA and has been authorized for detection and/or diagnosis of SARS-CoV-2 by FDA under an Emergency Use Authorization (EUA). This EUA will remain in effect (meaning this test can be used) for the duration of the COVID-19 declaration under Section 564(b)(1) of the Act, 21 U.S.C. section 360bbb-3(b)(1), unless the authorization is terminated or revoked.  Performed at [facility], [address], [place], [place] [address]        Radiology Studies: MR FOOT RIGHT WO CONTRAST  Result Date: 01/22/2021 CLINICAL DATA:  Great toe osteomyelitis. EXAM: MRI OF THE RIGHT FOREFOOT WITHOUT CONTRAST TECHNIQUE: Multiplanar, multisequence MR imaging of the right forefoot was performed. No intravenous contrast was administered. COMPARISON:  None. FINDINGS: Bones/Joint/Cartilage Soft tissue wound of the tip of the great toe. Severe bone marrow edema involving the entirety of the first distal phalanx with cortical destruction consistent with osteomyelitis. No other areas of bone destruction. Mild bone marrow edema in the medial hallux sesamoid likely reflecting sesamoiditis. Normal alignment. No joint effusion. Moderate osteoarthritis of the second tarsometatarsal joint. Ligaments Collateral ligaments are intact.  Lisfranc ligament is intact. Muscles and Tendons Flexor, peroneal and extensor compartment tendons are intact. T2 hyperintense signal throughout the  plantar musculature likely neurogenic. Soft tissue No fluid collection or hematoma. No soft tissue mass. Soft tissue edema along the dorsal aspect of the foot extending into the great toe consistent with cellulitis. IMPRESSION: 1. Soft tissue wound at the tip of the great toe. Osteomyelitis of the first distal phalanx. Cellulitis of the great toe extending into the dorsal aspect of the foot. Electronically Signed   By: Elige Ko M.D.   On: 01/22/2021 08:02   DG Toe Great Right  Result Date: 01/20/2021 CLINICAL DATA:  Ulcer. EXAM: RIGHT GREAT TOE COMPARISON:  None. FINDINGS: Lytic destruction is seen involving the distal tuft of the first distal phalanx concerning for osteomyelitis. Overlying soft tissue ulceration is noted. Joint spaces are intact. IMPRESSION: Lytic destruction seen involving distal tuft of first distal phalanx consistent with osteomyelitis. Electronically Signed   By: Lupita Raider M.D.   On: 01/20/2021 14:44   VAS [place] ABI WITH/WO TBI  Result Date: 01/21/2021  LOWER EXTREMITY DOPPLER STUDY Patient Name:  Martin [name]  Date of Exam:   01/21/2021 Medical Rec #: [id number]         Accession #:    [id number] Date of Birth: 1966-04-05         Patient Gender: M Patient Age:   17 years Exam Location:  [facility] Procedure:      VAS [place] ABI WITH/WO TBI Referring Phys: Jonny Ruiz DOUTOVA --------------------------------------------------------------------------------  Indications: Ulceration. High Risk Factors: Hypertension, Diabetes.  Limitations: Today's exam was limited due to an open wound and involuntary              patient movement. Comparison Study: No prior studies. Performing Technologist: Olen Cordial RVT  Examination Guidelines: A complete evaluation includes at minimum, Doppler waveform signals and systolic blood pressure reading at the level of bilateral brachial, anterior tibial, and posterior tibial arteries, when vessel segments are accessible. Bilateral testing is  considered an integral part  of a complete examination. Photoelectric Plethysmograph (PPG) waveforms and toe systolic pressure readings are included as required and additional duplex testing as needed. Limited examinations for reoccurring indications may be performed as noted.  ABI Findings: +---------+------------------+-----+---------+--------+ Right    Rt Pressure (mmHg)IndexWaveform Comment  +---------+------------------+-----+---------+--------+ Brachial 126                    triphasic         +---------+------------------+-----+---------+--------+ PTA      138               1.10 triphasic         +---------+------------------+-----+---------+--------+ DP       141               1.12 triphasic         +---------+------------------+-----+---------+--------+ Great Toe                                Ulcer    +---------+------------------+-----+---------+--------+ +---------+------------------+-----+---------+-------+ Left     Lt Pressure (mmHg)IndexWaveform Comment +---------+------------------+-----+---------+-------+ Brachial 109                    triphasic        +---------+------------------+-----+---------+-------+ PTA      138               1.10 triphasic        +---------+------------------+-----+---------+-------+ DP       131               1.04 triphasic        +---------+------------------+-----+---------+-------+ Great Toe94                0.75                  +---------+------------------+-----+---------+-------+ +-------+-----------+-----------+------------+------------+ ABI/TBIToday's ABIToday's TBIPrevious ABIPrevious TBI +-------+-----------+-----------+------------+------------+ Right  1.12                                           +-------+-----------+-----------+------------+------------+ Left   1.1        0.75                                +-------+-----------+-----------+------------+------------+  Summary: Right:  Resting right ankle-brachial index is within normal range. No evidence of significant right lower extremity arterial disease. Unable to obtain TBI due to great toe ulceration. Left: Resting left ankle-brachial index is within normal range. No evidence of significant left lower extremity arterial disease. The left toe-brachial index is normal.  *See table(s) above for measurements and observations.  Electronically signed by Lemar Livings MD on 01/21/2021 at 1:37:26 PM.    Final     Scheduled Meds:  atorvastatin  80 mg Oral QHS   Chlorhexidine Gluconate Cloth  6 each Topical Once   insulin aspart  0-9 Units Subcutaneous Q4H   insulin glargine-yfgn  130 Units Subcutaneous QHS   lisinopril  10 mg Oral Daily   metroNIDAZOLE  500 mg Oral BID   nicotine  21 mg Transdermal Daily   potassium chloride  40 mEq Oral Once   sodium chloride flush  3 mL Intravenous Q12H   Continuous Infusions:  sodium chloride     cefTRIAXone (ROCEPHIN)  IV 2 g (01/21/21 1427)  vancomycin 1,500 mg (01/22/21 0520)     LOS: 2 days   Time spent: 30 minutes   Hughie Closs, MD [facility]  01/22/2021, 10:42 AM   How to contact the [facility] : [facility] Attending or Consulting provider 7A - 7P or covering provider during after hours 7P -7A, for this patient?  Check the care team in [facility] and look for a) attending/consulting TRH provider listed and b) the [facility] team listed. Page or secure chat 7A-7P. Log into [email or web address] and use [facility] universal password to access. If you do not have the password, please contact the hospital operator. Locate the [facility] provider you are looking for under [facility] and page to a number that you can be directly reached. If you still have difficulty reaching the provider, please page the [facility] (Director on Call) for the Hospitalists listed on amion for assistance.

## 2021-01-22 NOTE — Progress Notes (Signed)
OT Cancellation Note  Patient Details Name: Martin [name] MRN: [id number] DOB: Jul 10, 1965   Cancelled Treatment:    Reason Eval/Treat Not Completed: Other (comment) Patients surgery was pushed to 01/23/21. Will plan to assess after surgery is completed.  Sharyn Blitz OTR/L, MS Acute Rehabilitation Department Office# [phone] Pager# [phone]  [name] 01/22/2021, 10:34 AM

## 2021-01-22 NOTE — Anesthesia Preprocedure Evaluation (Addendum)
Anesthesia Evaluation  Patient identified by MRN, date of birth, ID band Patient awake    Reviewed: Allergy & Precautions, H&P , NPO status , Patient's Chart, lab work & pertinent test results  Airway Mallampati: III  TM Distance: >3 FB Neck ROM: Full    Dental no notable dental hx. (+) Teeth Intact, Dental Advisory Given   Pulmonary neg pulmonary ROS,    Pulmonary exam normal breath sounds clear to auscultation       Cardiovascular Exercise Tolerance: Good hypertension, Pt. on medications  Rhythm:Regular Rate:Normal     Neuro/Psych negative neurological ROS  negative psych ROS   GI/Hepatic negative GI ROS, Neg liver ROS,   Endo/Other  diabetes, Insulin Dependent, Oral Hypoglycemic AgentsMorbid obesity  Renal/GU negative Renal ROS  negative genitourinary   Musculoskeletal   Abdominal   Peds  Hematology negative hematology ROS (+)   Anesthesia Other Findings   Reproductive/Obstetrics negative OB ROS                           Anesthesia Physical Anesthesia Plan  ASA: 3  Anesthesia Plan: General   Post-op Pain Management:    Induction: Intravenous  PONV Risk Score and Plan: 3 and Ondansetron, Dexamethasone and Midazolam  Airway Management Planned: LMA  Additional Equipment:   Intra-op Plan:   Post-operative Plan: Extubation in OR  Informed Consent: I have reviewed the patients History and Physical, chart, labs and discussed the procedure including the risks, benefits and alternatives for the proposed anesthesia with the patient or authorized representative who has indicated his/her understanding and acceptance.     Dental advisory given  Plan Discussed with: CRNA and Surgeon  Anesthesia Plan Comments:       Anesthesia Quick Evaluation

## 2021-01-22 NOTE — Progress Notes (Signed)
Subjective: 55 year old male admitted to the hospital for worsening infection of his right big toe.  X-rays were showing osteomyelitis and MRI was ordered which also confirmed osteomyelitis of the distal phalanx.  Patient was already scheduled for surgery today for toe amputation however given the OR schedule this cannot be dental after 5 PM today and the patient was having low blood sugars this morning.  He was rescheduled until tomorrow morning because of this.  He is feeling well and is up eating lunch when he saw him.  Denies any fevers or chills.  Objective: AAO x3, NAD DP/PT pulses palpable bilaterally, CRT less than 3 seconds Ulceration noted at the distal aspect the right hallux and there is small mount of purulence identified.  There is a blister present on the dorsal IPJ.  Edema and erythema present to the digit.  Slight erythema just proximal to the MPJ.  There is no red streaking.  No other open lesions are identified. No pain with calf compression, swelling, warmth, erythema  Assessment: Osteomyelitis right hallux with cellulitis  Plan: I reviewed the MRI with him.  I also called the patient earlier today given update about surgery as well as reviewed the MRI.  We will plan for surgery tomorrow morning at 730 as scheduled.  N.p.o. after midnight.  He has no further questions concerns about the surgery and will need to proceed with amputation of the right hallux given the infection.  He understands this will be the entire digit.  Originally he was hoping for partial toe mutation given the infection needs to have the total toe amputated.  I did discuss the surgery as well as postoperative course.  Discussed glucose control as his A1c was significantly elevated.  He had several questions in regards to his insulin.  He will need to follow-up with endocrinology in the his discharge.  He has no further questions or concerns today.  Ovid Curd, DPM

## 2021-01-23 ENCOUNTER — Encounter (HOSPITAL_COMMUNITY)
Admission: EM | Disposition: A | Discharge status: Home / Self Care | Payer: Self-pay | Source: Home / Self Care | Attending: Family Medicine

## 2021-01-23 ENCOUNTER — Inpatient Hospital Stay (HOSPITAL_COMMUNITY): Payer: 59 | Admitting: Anesthesiology

## 2021-01-23 ENCOUNTER — Inpatient Hospital Stay (HOSPITAL_COMMUNITY): Payer: 59

## 2021-01-23 DIAGNOSIS — M86671 Other chronic osteomyelitis, right ankle and foot: Secondary | ICD-10-CM

## 2021-01-23 HISTORY — PX: AMPUTATION TOE: SHX6595

## 2021-01-23 LAB — GLUCOSE, CAPILLARY
Glucose-Capillary: 139 mg/dL — ABNORMAL HIGH (ref 70–99)
Glucose-Capillary: 162 mg/dL — ABNORMAL HIGH (ref 70–99)
Glucose-Capillary: 182 mg/dL — ABNORMAL HIGH (ref 70–99)
Glucose-Capillary: 197 mg/dL — ABNORMAL HIGH (ref 70–99)
Glucose-Capillary: 85 mg/dL (ref 70–99)
Glucose-Capillary: 92 mg/dL (ref 70–99)

## 2021-01-23 LAB — BASIC METABOLIC PANEL
Anion gap: 9 (ref 5–15)
BUN: 15 mg/dL (ref 6–20)
CO2: 24 mmol/L (ref 22–32)
Calcium: 9.5 mg/dL (ref 8.9–10.3)
Chloride: 103 mmol/L (ref 98–111)
Creatinine, Ser: 0.86 mg/dL (ref 0.61–1.24)
GFR, Estimated: >60 mL/min (ref >60)
Glucose, Bld: 121 mg/dL — ABNORMAL HIGH (ref 70–99)
Potassium: 4.2 mmol/L (ref 3.5–5.1)
Sodium: 136 mmol/L (ref 135–145)

## 2021-01-23 SURGERY — AMPUTATION, TOE
Anesthesia: General | Site: Toe | Laterality: Right

## 2021-01-23 MED ORDER — BUPIVACAINE HCL (PF) 0.25 % IJ SOLN
INTRAMUSCULAR | Status: DC | PRN
  Administered 2021-01-23: 10 mL

## 2021-01-23 MED ORDER — HYDROMORPHONE HCL 1 MG/ML IJ SOLN: 0.2500 mg | INTRAMUSCULAR | Status: DC | PRN

## 2021-01-23 MED ORDER — BACITRACIN-NEOMYCIN-POLYMYXIN OINTMENT TUBE
1.0000 "application " | TOPICAL_OINTMENT | CUTANEOUS | Status: DC | PRN
  Filled 2021-01-23 (×2): qty 14

## 2021-01-23 MED ORDER — EPHEDRINE 5 MG/ML INJ
INTRAVENOUS | Status: AC
  Filled 2021-01-23: qty 5

## 2021-01-23 MED ORDER — FENTANYL CITRATE (PF) 100 MCG/2ML IJ SOLN
INTRAMUSCULAR | Status: DC | PRN
  Administered 2021-01-23: 100 ug via INTRAVENOUS

## 2021-01-23 MED ORDER — PHENYLEPHRINE 40 MCG/ML (10ML) SYRINGE FOR IV PUSH (FOR BLOOD PRESSURE SUPPORT)
PREFILLED_SYRINGE | INTRAVENOUS | Status: AC
  Filled 2021-01-23: qty 10

## 2021-01-23 MED ORDER — LIDOCAINE HCL 1 % IJ SOLN
INTRAMUSCULAR | Status: DC | PRN
  Administered 2021-01-23: 10 mL

## 2021-01-23 MED ORDER — ACETAMINOPHEN 10 MG/ML IV SOLN
INTRAVENOUS | Status: DC | PRN
  Administered 2021-01-23: 1000 mg via INTRAVENOUS

## 2021-01-23 MED ORDER — LIDOCAINE 2% (20 MG/ML) 5 ML SYRINGE
INTRAMUSCULAR | Status: DC | PRN
  Administered 2021-01-23: 60 mg via INTRAVENOUS

## 2021-01-23 MED ORDER — INSULIN GLARGINE-YFGN 100 UNIT/ML ~~LOC~~ SOLN
100.0000 [IU] | Freq: Every day | SUBCUTANEOUS | Status: DC
  Administered 2021-01-23 – 2021-01-24 (×2): 100 [IU] via SUBCUTANEOUS
  Filled 2021-01-23 (×2): qty 1

## 2021-01-23 MED ORDER — LACTATED RINGERS IV SOLN
INTRAVENOUS | Status: DC | PRN
Start: 2021-01-23 — End: 2021-01-23

## 2021-01-23 MED ORDER — LIDOCAINE 2% (20 MG/ML) 5 ML SYRINGE
INTRAMUSCULAR | Status: AC
  Filled 2021-01-23: qty 5

## 2021-01-23 MED ORDER — PROPOFOL 10 MG/ML IV BOLUS
INTRAVENOUS | Status: DC | PRN
  Administered 2021-01-23: 150 mg via INTRAVENOUS

## 2021-01-23 MED ORDER — ONDANSETRON HCL 4 MG/2ML IJ SOLN
INTRAMUSCULAR | Status: AC
  Filled 2021-01-23: qty 2

## 2021-01-23 MED ORDER — MIDAZOLAM HCL 2 MG/2ML IJ SOLN
INTRAMUSCULAR | Status: AC
  Filled 2021-01-23: qty 2

## 2021-01-23 MED ORDER — ONDANSETRON HCL 4 MG/2ML IJ SOLN
INTRAMUSCULAR | Status: DC | PRN
  Administered 2021-01-23: 4 mg via INTRAVENOUS

## 2021-01-23 MED ORDER — LIDOCAINE HCL (PF) 1 % IJ SOLN
INTRAMUSCULAR | Status: AC
  Filled 2021-01-23: qty 30

## 2021-01-23 MED ORDER — PROPOFOL 10 MG/ML IV BOLUS
INTRAVENOUS | Status: AC
  Filled 2021-01-23: qty 20

## 2021-01-23 MED ORDER — ACETAMINOPHEN 500 MG PO TABS
1000.0000 mg | ORAL_TABLET | Freq: Once | ORAL | Status: AC
  Administered 2021-01-23: 1000 mg via ORAL
  Filled 2021-01-23: qty 2

## 2021-01-23 MED ORDER — EPHEDRINE SULFATE-NACL 50-0.9 MG/10ML-% IV SOSY
PREFILLED_SYRINGE | INTRAVENOUS | Status: DC | PRN
  Administered 2021-01-23 (×3): 5 mg via INTRAVENOUS

## 2021-01-23 MED ORDER — BUPIVACAINE HCL (PF) 0.5 % IJ SOLN
INTRAMUSCULAR | Status: AC
  Filled 2021-01-23: qty 30

## 2021-01-23 MED ORDER — DICLOFENAC SODIUM 1 % EX GEL
2.0000 g | Freq: Four times a day (QID) | CUTANEOUS | Status: DC
  Administered 2021-01-23 – 2021-01-25 (×6): 2 g via TOPICAL
  Filled 2021-01-23: qty 100

## 2021-01-23 MED ORDER — PHENYLEPHRINE 40 MCG/ML (10ML) SYRINGE FOR IV PUSH (FOR BLOOD PRESSURE SUPPORT)
PREFILLED_SYRINGE | INTRAVENOUS | Status: DC | PRN
  Administered 2021-01-23: 40 ug via INTRAVENOUS

## 2021-01-23 MED ORDER — MIDAZOLAM HCL 5 MG/5ML IJ SOLN
INTRAMUSCULAR | Status: DC | PRN
  Administered 2021-01-23: 2 mg via INTRAVENOUS

## 2021-01-23 MED ORDER — FENTANYL CITRATE (PF) 100 MCG/2ML IJ SOLN
INTRAMUSCULAR | Status: AC
  Filled 2021-01-23: qty 2

## 2021-01-23 MED ORDER — ACETAMINOPHEN 10 MG/ML IV SOLN
INTRAVENOUS | Status: AC
  Filled 2021-01-23: qty 100

## 2021-01-23 MED ORDER — 0.9 % SODIUM CHLORIDE (POUR BTL) OPTIME
TOPICAL | Status: DC | PRN
  Administered 2021-01-23: 1000 mL

## 2021-01-23 SURGICAL SUPPLY — 28 items
BLADE HEX COATED 2.75 (ELECTRODE) ×2 IMPLANT
BLADE OSCILLATING/SAGITTAL (BLADE) ×2
BLADE SW THK.38XMED LNG THN (BLADE) ×1 IMPLANT
BNDG CMPR 9X4 STRL LF SNTH (GAUZE/BANDAGES/DRESSINGS) ×1
BNDG ELASTIC 4X5.8 VLCR STR LF (GAUZE/BANDAGES/DRESSINGS) ×1 IMPLANT
BNDG ESMARK 4X9 LF (GAUZE/BANDAGES/DRESSINGS) ×2 IMPLANT
BNDG GAUZE ELAST 4 BULKY (GAUZE/BANDAGES/DRESSINGS) ×2 IMPLANT
DRAPE U-SHAPE 47X51 STRL (DRAPES) ×1 IMPLANT
DRSG KUZMA FLUFF (GAUZE/BANDAGES/DRESSINGS) ×1 IMPLANT
DURAPREP 26ML APPLICATOR (WOUND CARE) ×2 IMPLANT
ELECT PENCIL ROCKER SW 15FT (MISCELLANEOUS) ×1 IMPLANT
ELECT REM PT RETURN 15FT ADLT (MISCELLANEOUS) ×2 IMPLANT
GAUZE SPONGE 4X4 12PLY STRL (GAUZE/BANDAGES/DRESSINGS) ×2 IMPLANT
GAUZE XEROFORM 1X8 LF (GAUZE/BANDAGES/DRESSINGS) ×1 IMPLANT
GLOVE SURG ENC MOIS LTX SZ7.5 (GLOVE) ×4 IMPLANT
GLOVE SURG ENC MOIS LTX SZ8 (GLOVE) ×1 IMPLANT
GLOVE SURG LTX SZ8 (GLOVE) ×1 IMPLANT
GLOVE SURG UNDER POLY LF SZ7.5 (GLOVE) ×2 IMPLANT
GOWN STRL REUS W/ TWL XL LVL3 (GOWN DISPOSABLE) ×1 IMPLANT
GOWN STRL REUS W/TWL XL LVL3 (GOWN DISPOSABLE) ×4
KIT BASIN OR (CUSTOM PROCEDURE TRAY) ×2 IMPLANT
KIT TURNOVER KIT A (KITS) ×2 IMPLANT
NEEDLE HYPO 22GX1.5 SAFETY (NEEDLE) ×2 IMPLANT
PACK ORTHO EXTREMITY (CUSTOM PROCEDURE TRAY) ×1 IMPLANT
SUT ETHILON 3 0 PS 1 (SUTURE) ×3 IMPLANT
SUT MNCRL AB 3-0 PS2 18 (SUTURE) ×2 IMPLANT
SYR CONTROL 10ML LL (SYRINGE) ×2 IMPLANT
UNDERPAD 30X36 HEAVY ABSORB (UNDERPADS AND DIAPERS) ×2 IMPLANT

## 2021-01-23 NOTE — Progress Notes (Signed)
Pt received back on the unit after surgery. Pt alert and oriented x4. No pain at the time. Pt reoriented to room and call bell.

## 2021-01-23 NOTE — Transfer of Care (Signed)
Immediate Anesthesia Transfer of Care Note  Patient: Martin [name]  Procedure(s) Performed: AMPUTATION OF HALLUX (Right: Toe)  Patient Location: PACU  Anesthesia Type:General  Level of Consciousness: awake, alert , oriented and patient cooperative  Airway & Oxygen Therapy: Patient Spontanous Breathing and Patient connected to face mask oxygen  Post-op Assessment: Report given to RN, Post -op Vital signs reviewed and stable and Patient moving all extremities  Post vital signs: Reviewed and stable  Last Vitals:  Vitals Value Taken Time  BP    Temp    Pulse    Resp    SpO2      Last Pain:  Vitals:   01/23/21 0354  TempSrc: Oral  PainSc:       Patients Stated Pain Goal: 0 (01/20/21 2009)  Complications: No notable events documented.

## 2021-01-23 NOTE — Anesthesia Postprocedure Evaluation (Signed)
Anesthesia Post Note  Patient: Martin [name]  Procedure(s) Performed: AMPUTATION OF HALLUX (Right: Toe)     Patient location during evaluation: PACU Anesthesia Type: General Level of consciousness: awake and alert Pain management: pain level controlled Vital Signs Assessment: post-procedure vital signs reviewed and stable Respiratory status: spontaneous breathing, nonlabored ventilation and respiratory function stable Cardiovascular status: blood pressure returned to baseline and stable Postop Assessment: no apparent nausea or vomiting Anesthetic complications: no   No notable events documented.  Last Vitals:  Vitals:   01/23/21 0915 01/23/21 0939  BP: 131/79 134/77  Pulse: 73 67  Resp: (!) 21 19  Temp:  (!) 36.3 C  SpO2: 98% 99%    Last Pain:  Vitals:   01/23/21 0939  TempSrc: Oral  PainSc:                  Evanie Buckle,W. EDMOND

## 2021-01-23 NOTE — Progress Notes (Signed)
Patient seen in pre-op. Scheduled for right hallux amputation. His wife is at bedside. We again discussed surgery and post-op course. Reviewed consent and signed. No further questions or concerns. Will proceed as scheduled.   Also, he has a rash on his legs and arms left worse than right. No chest pain, SOB. Spoke to CRNA and we will give benadryl. Will monitor.   Ovid Curd, DPM

## 2021-01-23 NOTE — Brief Op Note (Signed)
01/23/2021  8:57 AM  PATIENT:  Martin [name]  55 y.o. male  PRE-OPERATIVE DIAGNOSIS:  osteomyelitis  POST-OPERATIVE DIAGNOSIS:  osteomyelitis  PROCEDURE:  Procedure(s): AMPUTATION OF HALLUX (Right)  SURGEON:  Surgeon(s) and Role:    Vivi Barrack, DPM - Primary  PHYSICIAN ASSISTANT:   ASSISTANTS: none   ANESTHESIA:   general  EBL:  10 mL   BLOOD ADMINISTERED:none  DRAINS: none   LOCAL MEDICATIONS USED:  OTHER 20 cc lidocaine and marcaine plain  SPECIMEN:  Source of Specimen:  toe for pathology  DISPOSITION OF SPECIMEN:  PATHOLOGY  COUNTS:  YES  TOURNIQUET:  * Missing tourniquet times found for documented tourniquets in log: [id number] *  DICTATION: .Reubin Milan Dictation  PLAN OF CARE: Admit to inpatient   PATIENT DISPOSITION:  PACU - hemodynamically stable.   Delay start of Pharmacological VTE agent (>24hrs) due to surgical blood loss or risk of bleeding: no  Intraoperative findings: Infection to the MPJ. Underwent partial 1st ray amputation due to infection. There was some tracking to the midfoot plantar. No purulence noted. Debrided nonviable and infected tissue. Remaining tissue appears healthy, bleeding.   Post-op Plan: Remain on IV antibiotics for 48 hours. Likely plan for discharge on Monday. PWB to heel (in Darco wedge shoe).

## 2021-01-23 NOTE — Anesthesia Procedure Notes (Signed)
Procedure Name: LMA Insertion Date/Time: 01/23/2021 7:58 AM Performed by: Elisabeth Cara, CRNA Pre-anesthesia Checklist: Patient identified, Emergency Drugs available, Suction available, Patient being monitored and Timeout performed Patient Re-evaluated:Patient Re-evaluated prior to induction Oxygen Delivery Method: Circle system utilized Preoxygenation: Pre-oxygenation with 100% oxygen Induction Type: IV induction LMA: LMA with gastric port inserted LMA Size: 4.0 Number of attempts: 1 Placement Confirmation: positive ETCO2 and breath sounds checked- equal and bilateral Tube secured with: Tape Dental Injury: Teeth and Oropharynx as per pre-operative assessment

## 2021-01-23 NOTE — Progress Notes (Signed)
PROGRESS NOTE    Martin [name]  EPP:[id number] DOB: [birth year] DOA: 01/20/2021 PCP: Patient, No Pcp Per (Inactive)   Brief Narrative:  Martin [name] is a 55 y.o. male with medical history significant of DM2, HTN, HLD presented to ED with worsening right toe infection.  He previously had been on 10 days of doxycycline without any improvement, he called the office and was switched to clindamycin on Monday.  Despite of this infection got worse and so he came to the ED.  X-ray shows osteomyelitis.  Admitted with IV antibiotics and podiatry consulted.  Assessment & Plan:   Active Problems:   Essential hypertension, benign   Type 2 diabetes mellitus (HCC)   Hypertension   Osteomyelitis (HCC)  Right great toe osteomyelitis: Podiatry on board.  MRI and first distal phalanx osteomyelitis with overlying cellulitis.  Underwent first ray amputation.  Podiatry recommends continuing IV antibiotics for 48 hours, potential discharge on Monday.  Type 2 diabetes mellitus: Takes 160 units of Lantus, metformin at home.  He was given 130 units here day before yesterday and he was hypoglycemic yesterday morning.  Was given 80 units last night since he was n.p.o. and blood sugar within normal range today.  Now that he is going to eat, I will increase 200 units Lantus tonight and continue SSI.  Hypokalemia: Resolved  Hyperlipidemia: Continue Lipitor.  Essential hypertension: Controlled.  Continue lisinopril  Petechial rash: Patient developed petechial rashes on the extremities after he was prepped with chlorhexidine.  Received Benadryl.  Will watch closely.  DVT prophylaxis: SCDs Start: 01/20/21 2057   Code Status: Full Code  Family Communication: Wife present at bedside.  Plan of care discussed with patient in length and he verbalized understanding and agreed with it.  Status is: Inpatient  Remains inpatient appropriate because:Inpatient level of care appropriate due to severity of  illness  Dispo: The patient is from: Home              Anticipated d/c is to: Home              Patient currently is not medically stable to d/c.   Difficult to place patient No        Estimated body mass index is 37.94 kg/m as calculated from the following:   Height as of this encounter: 5\' 11"  (1.803 m).   Weight as of this encounter: 123.4 kg.     Nutritional Assessment: Body mass index is 37.94 kg/m. Seen by dietician.  I agree with the assessment and plan as outlined below: Nutrition Status: Nutrition Problem: Increased nutrient needs Etiology: wound healing Signs/Symptoms: estimated needs Interventions: Education, MVI  .  Skin Assessment: I have examined the patient's skin and I agree with the wound assessment as performed by the wound care RN as outlined below:    Consultants:  Podiatry  Procedures:  As above  Antimicrobials:  Anti-infectives (From admission, onward)    Start     Dose/Rate Route Frequency Ordered Stop   01/21/21 1800  vancomycin (VANCOREADY) IVPB 1500 mg/300 mL        1,500 mg 150 mL/hr over 120 Minutes Intravenous Every 12 hours 01/21/21 1323     01/21/21 1500  cefTRIAXone (ROCEPHIN) 2 g in sodium chloride 0.9 % 100 mL IVPB        2 g 200 mL/hr over 30 Minutes Intravenous Every 24 hours 01/20/21 2057 01/28/21 1459   01/21/21 0500  vancomycin (VANCOREADY) IVPB 2000 mg/400 mL  Status:  Discontinued  2,000 mg 200 mL/hr over 120 Minutes Intravenous Every 12 hours 01/20/21 1514 01/21/21 1323   01/20/21 2200  metroNIDAZOLE (FLAGYL) tablet 500 mg        500 mg Oral 2 times daily 01/20/21 2057 01/27/21 2159   01/20/21 1630  vancomycin (VANCOCIN) IVPB 1000 mg/200 mL premix        1,000 mg 200 mL/hr over 60 Minutes Intravenous  Once 01/20/21 1500 01/20/21 1912   01/20/21 1515  vancomycin (VANCOCIN) IVPB 1000 mg/200 mL premix        1,000 mg 200 mL/hr over 60 Minutes Intravenous  Once 01/20/21 1500 01/20/21 1801   01/20/21 1500   cefTRIAXone (ROCEPHIN) 2 g in sodium chloride 0.9 % 100 mL IVPB        2 g 200 mL/hr over 30 Minutes Intravenous  Once 01/20/21 1449 01/20/21 1641          Subjective: Seen and examined after the procedure.  Wife at the bedside.  He had no complaints.  Objective: Vitals:   01/23/21 0853 01/23/21 0900 01/23/21 0915 01/23/21 0939  BP: 125/80 133/86 131/79 134/77  Pulse: 67 76 73 67  Resp: 20 13 (!) 21 19  Temp: (!) 96.2 F (35.7 C)   (!) 97.4 F (36.3 C)  TempSrc:    Oral  SpO2: 99% 99% 98% 99%  Weight:      Height:        Intake/Output Summary (Last 24 hours) at 01/23/2021 1256 Last data filed at 01/23/2021 0920 Gross per 24 hour  Intake 1810 ml  Output 10 ml  Net 1800 ml    Filed Weights   01/20/21 1320  Weight: 123.4 kg    Examination:  General exam: Appears calm and comfortable  Respiratory system: Clear to auscultation. Respiratory effort normal. Cardiovascular system: S1 & S2 heard, RRR. No JVD, murmurs, rubs, gallops or clicks. No pedal edema. Gastrointestinal system: Abdomen is nondistended, soft and nontender. No organomegaly or masses felt. Normal bowel sounds heard. Central nervous system: Alert and oriented. No focal neurological deficits. Extremities: Dressing in the right foot. Skin: Petechial rashes in all 4 extremities, not on torso. Psychiatry: Judgement and insight appear normal. Mood & affect appropriate.    Data Reviewed: I have personally reviewed following labs and imaging studies  CBC: Recent Labs  Lab 01/20/21 1420 01/21/21 0953  WBC 9.5 10.3  NEUTROABS 5.7 7.1  HGB 14.3 13.9  HCT 42.9 43.1  MCV 83.8 87.4  PLT 265 282    Basic Metabolic Panel: Recent Labs  Lab 01/20/21 1420 01/21/21 0953 01/22/21 0521 01/23/21 0518  NA 135 138 139 136  K 4.5 4.1 3.4* 4.2  CL 101 105 107 103  CO2 GLUCOSE 156* 134* 77 121*  BUN CREATININE 0.67 0.68 0.79 0.86  CALCIUM 9.8 9.4 9.6 9.5  MG  --  1.7  --   --    PHOS  --  3.6  --   --     GFR: Estimated Creatinine Clearance: 129.7 mL/min (by C-G formula based on SCr of 0.86 mg/dL). Liver Function Tests: Recent Labs  Lab 01/21/21 0953  AST 19  ALT 22  ALKPHOS 80  BILITOT 0.5  PROT 7.1  ALBUMIN 3.0*    No results for input(s): LIPASE, AMYLASE in the last 168 hours. No results for input(s): AMMONIA in the last 168 hours. Coagulation Profile: No results for input(s): INR, PROTIME in the last 168 hours.  Cardiac Enzymes: No results for input(s): CKTOTAL, CKMB, CKMBINDEX, TROPONINI in the last 168 hours. BNP (last 3 results) No results for input(s): PROBNP in the last 8760 hours. HbA1C: Recent Labs    01/21/21 0953  HGBA1C 11.1*    CBG: Recent Labs  Lab 01/22/21 2018 01/23/21 0006 01/23/21 0358 01/23/21 0856 01/23/21 1158  GLUCAP 107* 197* 139* 85 92    Lipid Profile: No results for input(s): CHOL, HDL, LDLCALC, TRIG, CHOLHDL, LDLDIRECT in the last 72 hours. Thyroid Function Tests: Recent Labs    01/21/21 0953  TSH 4.587*    Anemia Panel: No results for input(s): VITAMINB12, FOLATE, FERRITIN, TIBC, IRON, RETICCTPCT in the last 72 hours. Sepsis Labs: No results for input(s): PROCALCITON, LATICACIDVEN in the last 168 hours.  Recent Results (from the past 240 hour(s))  Culture, blood (routine x 2)     Status: None (Preliminary result)   Collection Time: 01/20/21  3:05 PM   Specimen: BLOOD  Result Value Ref Range Status   Specimen Description BLOOD BLOOD LEFT FOREARM  Final   Special Requests   Final    BOTTLES DRAWN AEROBIC AND ANAEROBIC Blood Culture results may not be optimal due to an excessive volume of blood received in culture bottles   Culture   Final    NO GROWTH 3 DAYS Performed at [facility], [address] [place], [place] [address]    Report Status PENDING  Incomplete  Culture, blood (routine x 2)     Status: None (Preliminary result)   Collection Time: 01/20/21  3:05 PM   Specimen: BLOOD   Result Value Ref Range Status   Specimen Description BLOOD RIGHT ANTECUBITAL  Final   Special Requests   Final    BOTTLES DRAWN AEROBIC AND ANAEROBIC Blood Culture results may not be optimal due to an inadequate volume of blood received in culture bottles   Culture   Final    NO GROWTH 3 DAYS Performed at [facility], [address] [place], [place] [address]    Report Status PENDING  Incomplete  Resp Panel by RT-PCR (Flu A&B, Covid) Nasopharyngeal Swab     Status: None   Collection Time: 01/20/21  4:50 PM   Specimen: Nasopharyngeal Swab; Nasopharyngeal(NP) swabs in vial transport medium  Result Value Ref Range Status   SARS Coronavirus 2 by RT PCR NEGATIVE NEGATIVE Final    Comment: (NOTE) SARS-CoV-2 target nucleic acids are NOT DETECTED.  The SARS-CoV-2 RNA is generally detectable in upper respiratory specimens during the acute phase of infection. The lowest concentration of SARS-CoV-2 viral copies this assay can detect is 138 copies/mL. A negative result does not preclude SARS-Cov-2 infection and should not be used as the sole basis for treatment or other patient management decisions. A negative result may occur with  improper specimen collection/handling, submission of specimen other than nasopharyngeal swab, presence of viral mutation(s) within the areas targeted by this assay, and inadequate number of viral copies(<138 copies/mL). A negative result must be combined with clinical observations, patient history, and epidemiological information. The expected result is Negative.  Fact Sheet for Patients:  BloggerCourse.com  Fact Sheet for Healthcare Providers:  SeriousBroker.it  This test is no t yet approved or cleared by the [place] FDA and  has been authorized for detection and/or diagnosis of SARS-CoV-2 by FDA under an Emergency Use Authorization (EUA). This EUA will remain  in effect (meaning this test can  be used) for the duration of the COVID-19 declaration  under Section 564(b)(1) of the Act, 21 U.S.C.section 360bbb-3(b)(1), unless the authorization is terminated  or revoked sooner.       Influenza A by PCR NEGATIVE NEGATIVE Final   Influenza B by PCR NEGATIVE NEGATIVE Final    Comment: (NOTE) The Xpert Xpress SARS-CoV-2/FLU/RSV plus assay is intended as an aid in the diagnosis of influenza from Nasopharyngeal swab specimens and should not be used as a sole basis for treatment. Nasal washings and aspirates are unacceptable for Xpert Xpress SARS-CoV-2/FLU/RSV testing.  Fact Sheet for Patients: BloggerCourse.com  Fact Sheet for Healthcare Providers: SeriousBroker.it  This test is not yet approved or cleared by the [place] FDA and has been authorized for detection and/or diagnosis of SARS-CoV-2 by FDA under an Emergency Use Authorization (EUA). This EUA will remain in effect (meaning this test can be used) for the duration of the COVID-19 declaration under Section 564(b)(1) of the Act, 21 U.S.C. section 360bbb-3(b)(1), unless the authorization is terminated or revoked.  Performed at [facility], [address], [place], [place] [address]        Radiology Studies: MR FOOT RIGHT WO CONTRAST  Result Date: 01/22/2021 CLINICAL DATA:  Great toe osteomyelitis. EXAM: MRI OF THE RIGHT FOREFOOT WITHOUT CONTRAST TECHNIQUE: Multiplanar, multisequence MR imaging of the right forefoot was performed. No intravenous contrast was administered. COMPARISON:  None. FINDINGS: Bones/Joint/Cartilage Soft tissue wound of the tip of the great toe. Severe bone marrow edema involving the entirety of the first distal phalanx with cortical destruction consistent with osteomyelitis. No other areas of bone destruction. Mild bone marrow edema in the medial hallux sesamoid likely reflecting sesamoiditis. Normal alignment. No joint  effusion. Moderate osteoarthritis of the second tarsometatarsal joint. Ligaments Collateral ligaments are intact.  Lisfranc ligament is intact. Muscles and Tendons Flexor, peroneal and extensor compartment tendons are intact. T2 hyperintense signal throughout the plantar musculature likely neurogenic. Soft tissue No fluid collection or hematoma. No soft tissue mass. Soft tissue edema along the dorsal aspect of the foot extending into the great toe consistent with cellulitis. IMPRESSION: 1. Soft tissue wound at the tip of the great toe. Osteomyelitis of the first distal phalanx. Cellulitis of the great toe extending into the dorsal aspect of the foot. Electronically Signed   By: Elige Ko M.D.   On: 01/22/2021 08:02   DG Foot 2 Views Right  Result Date: 01/23/2021 CLINICAL DATA:  Post right great toe amputation. EXAM: RIGHT FOOT - 2 VIEW COMPARISON:  Right great toe radiographs-01/20/2021 FINDINGS: Post amputation of the great toe and distal end of the first metatarsal Expected stranding about the operative site. No radiopaque foreign body. No subcutaneous emphysema. No residual areas of osteolysis to suggest osteomyelitis. Mild degenerative change of several midfoot articulations. No discrete erosions. Tiny plantar calcaneal spur. Minimal enthesopathic change involving the Achilles tendon insertion site. Additionally, linear calcifications are seen with the expected location of the distal fibers of the Achilles tendon. IMPRESSION: Post amputation the great toe and distal end of the first metatarsal without evidence of complication. Electronically Signed   By: Simonne Come M.D.   On: 01/23/2021 10:34    Scheduled Meds:  atorvastatin  80 mg Oral QHS   insulin aspart  0-9 Units Subcutaneous Q4H   insulin glargine-yfgn  80 Units Subcutaneous QHS   lisinopril  10 mg Oral Daily   metroNIDAZOLE  500 mg Oral BID   nicotine  21 mg Transdermal Daily   potassium chloride  40 mEq Oral Once   sodium  chloride  flush  3 mL Intravenous Q12H   Continuous Infusions:  sodium chloride     cefTRIAXone (ROCEPHIN)  IV 2 g (01/22/21 1534)   vancomycin 1,500 mg (01/23/21 0508)     LOS: 3 days   Time spent: 28 minutes   Hughie Clossavi Alferd Obryant, MD [facility]  01/23/2021, 12:56 PM   How to contact the [facility] Attending or Consulting provider 7A - 7P or covering provider during after hours 7P -7A, for this patient?  Check the care team in [facility] and look for a) attending/consulting [facility] provider listed and b) the [facility] team listed. Page or secure chat 7A-7P. Log into [email or web address] and use [place]'s universal password to access. If you do not have the password, please contact the hospital operator. Locate the [facility] provider you are looking for under [facility] and page to a number that you can be directly reached. If you still have difficulty reaching the provider, please page the [facility] (Director on Call) for the Hospitalists listed on amion for assistance.

## 2021-01-24 ENCOUNTER — Encounter (HOSPITAL_COMMUNITY): Payer: Self-pay | Admitting: Podiatry

## 2021-01-24 LAB — CBC WITH DIFFERENTIAL/PLATELET
Abs Immature Granulocytes: 0.08 10*3/uL — ABNORMAL HIGH (ref 0.00–0.07)
Basophils Absolute: 0 10*3/uL (ref 0.0–0.1)
Basophils Relative: 0 %
Eosinophils Absolute: 0.7 10*3/uL — ABNORMAL HIGH (ref 0.0–0.5)
Eosinophils Relative: 6 %
HCT: 42.8 % (ref 39.0–52.0)
Hemoglobin: 13.7 g/dL (ref 13.0–17.0)
Immature Granulocytes: 1 %
Lymphocytes Relative: 18 %
Lymphs Abs: 2 10*3/uL (ref 0.7–4.0)
MCH: 28 pg (ref 26.0–34.0)
MCHC: 32 g/dL (ref 30.0–36.0)
MCV: 87.3 fL (ref 80.0–100.0)
Monocytes Absolute: 0.9 10*3/uL (ref 0.1–1.0)
Monocytes Relative: 8 %
Neutro Abs: 7.5 10*3/uL (ref 1.7–7.7)
Neutrophils Relative %: 67 %
Platelets: 306 10*3/uL (ref 150–400)
RBC: 4.9 MIL/uL (ref 4.22–5.81)
RDW: 12.4 % (ref 11.5–15.5)
WBC: 11.2 10*3/uL — ABNORMAL HIGH (ref 4.0–10.5)
nRBC: 0 % (ref 0.0–0.2)

## 2021-01-24 LAB — GLUCOSE, CAPILLARY
Glucose-Capillary: 106 mg/dL — ABNORMAL HIGH (ref 70–99)
Glucose-Capillary: 142 mg/dL — ABNORMAL HIGH (ref 70–99)
Glucose-Capillary: 162 mg/dL — ABNORMAL HIGH (ref 70–99)
Glucose-Capillary: 172 mg/dL — ABNORMAL HIGH (ref 70–99)
Glucose-Capillary: 172 mg/dL — ABNORMAL HIGH (ref 70–99)
Glucose-Capillary: 181 mg/dL — ABNORMAL HIGH (ref 70–99)
Glucose-Capillary: 86 mg/dL (ref 70–99)

## 2021-01-24 LAB — BASIC METABOLIC PANEL
Anion gap: 7 (ref 5–15)
BUN: 14 mg/dL (ref 6–20)
CO2: 25 mmol/L (ref 22–32)
Calcium: 9.3 mg/dL (ref 8.9–10.3)
Chloride: 104 mmol/L (ref 98–111)
Creatinine, Ser: 0.78 mg/dL (ref 0.61–1.24)
GFR, Estimated: >60 mL/min (ref >60)
Glucose, Bld: 97 mg/dL (ref 70–99)
Potassium: 3.8 mmol/L (ref 3.5–5.1)
Sodium: 136 mmol/L (ref 135–145)

## 2021-01-24 NOTE — Progress Notes (Signed)
Pharmacy Antibiotic Note  Martin [name] is a 55 y.o. male admitted on 01/20/2021 with  osteomyelitis . Pharmacy has been consulted for vancomycin dosing. Pt is afebrile and WBC is WNL. Scr is WNL at 0.67. Pt was on clindamycin PTA without improvement.   Today, 01/24/2021: D5 abx WBC now slightly elevated Remains afebrile since admission SCr stable WNL Pt s/p partial 1st ray amputation 8/20; per Podiatry, good source control - plan is to remain on IV abx 48 more hrs; likely discharge on Monday  Plan: Continue Vanc 1500mg  q12; hold off on checking levels as anticipate stopping soon Continue CTX/Flagyl as ordered   Height: 5\' 11"  (180.3 cm) Weight: 123.4 kg (272 lb) IBW/kg (Calculated) : 75.3  Temp (24hrs), Avg:98.2 F (36.8 C), Min:97.6 F (36.4 C), Max:98.6 F (37 C)  Recent Labs  Lab 01/20/21 1420 01/21/21 0953 01/22/21 0521 01/23/21 0518 01/24/21 0427  WBC 9.5 10.3  --   --  11.2*  CREATININE 0.67 0.68 0.79 0.86 0.78     Estimated Creatinine Clearance: 139.5 mL/min (by C-G formula based on SCr of 0.78 mg/dL).    No Known Allergies  Antimicrobials this admission: 8/17 CTX >> 8/17 Vanc >> 8/18 Flagyl po >>  Dose adjustments this admission: 8/18 Vanc 2gm q12 >> 1500mg  q12  Microbiology results: 8/17 BCx: ngtd  Thank you for allowing pharmacy to be a part of this patient's care.  Nazarene Bunning A PharmD 01/24/2021 10:59 AM

## 2021-01-24 NOTE — Progress Notes (Signed)
PROGRESS NOTE    Martin [name]  BTD:[id number] DOB: Jan 24, 1966 DOA: 01/20/2021 PCP: Patient, No Pcp Per (Inactive)   Brief Narrative:  Martin [name] is a 55 y.o. male with medical history significant of DM2, HTN, HLD presented to ED with worsening right toe infection.  He previously had been on 10 days of doxycycline without any improvement, he called the office and was switched to clindamycin on Monday.  Despite of this infection got worse and so he came to the ED.  X-ray shows osteomyelitis.  Admitted with IV antibiotics and podiatry consulted.  Assessment & Plan:   Active Problems:   Essential hypertension, benign   Type 2 diabetes mellitus (HCC)   Hypertension   Osteomyelitis (HCC)  Right great toe osteomyelitis: Podiatry on board.  MRI and first distal phalanx osteomyelitis with overlying cellulitis.  Underwent first ray amputation on 01/23/2021.  Podiatry recommends continuing IV antibiotics for 48 hours, potential discharge on Monday.  Type 2 diabetes mellitus: Takes 160 units of Lantus, metformin at home.  Currently on 100 units of Lantus and SSI, blood sugar controlled.  Hypokalemia: Resolved  Hyperlipidemia: Continue Lipitor.  Essential hypertension: Controlled.  Continue lisinopril  Petechial rash: Patient developed petechial rashes on the extremities after he was prepped with chlorhexidine.  Received Benadryl.  Rash improving.  Left knee pain: On examination, nontender, no warmth, no swelling.  No indication of any acute issues going on.  No indication of any imaging studies.  DVT prophylaxis: SCDs Start: 01/20/21 2057   Code Status: Full Code  Family Communication: Wife present at bedside.  Plan of care discussed with patient in length and he verbalized understanding and agreed with it.  Status is: Inpatient  Remains inpatient appropriate because:Inpatient level of care appropriate due to severity of illness  Dispo: The patient is from: Home               Anticipated d/c is to: Home              Patient currently is not medically stable to d/c.   Difficult to place patient No        Estimated body mass index is 37.94 kg/m as calculated from the following:   Height as of this encounter: 5\' 11"  (1.803 m).   Weight as of this encounter: 123.4 kg.     Nutritional Assessment: Body mass index is 37.94 kg/m. Seen by dietician.  I agree with the assessment and plan as outlined below: Nutrition Status: Nutrition Problem: Increased nutrient needs Etiology: wound healing Signs/Symptoms: estimated needs Interventions: Education, MVI  .  Skin Assessment: I have examined the patient's skin and I agree with the wound assessment as performed by the wound care RN as outlined below:    Consultants:  Podiatry  Procedures:  As above  Antimicrobials:  Anti-infectives (From admission, onward)    Start     Dose/Rate Route Frequency Ordered Stop   01/21/21 1800  vancomycin (VANCOREADY) IVPB 1500 mg/300 mL        1,500 mg 150 mL/hr over 120 Minutes Intravenous Every 12 hours 01/21/21 1323     01/21/21 1500  cefTRIAXone (ROCEPHIN) 2 g in sodium chloride 0.9 % 100 mL IVPB        2 g 200 mL/hr over 30 Minutes Intravenous Every 24 hours 01/20/21 2057 01/28/21 1459   01/21/21 0500  vancomycin (VANCOREADY) IVPB 2000 mg/400 mL  Status:  Discontinued        2,000 mg 200 mL/hr over  120 Minutes Intravenous Every 12 hours 01/20/21 1514 01/21/21 1323   01/20/21 2200  metroNIDAZOLE (FLAGYL) tablet 500 mg        500 mg Oral 2 times daily 01/20/21 2057 01/27/21 2159   01/20/21 1630  vancomycin (VANCOCIN) IVPB 1000 mg/200 mL premix        1,000 mg 200 mL/hr over 60 Minutes Intravenous  Once 01/20/21 1500 01/20/21 1912   01/20/21 1515  vancomycin (VANCOCIN) IVPB 1000 mg/200 mL premix        1,000 mg 200 mL/hr over 60 Minutes Intravenous  Once 01/20/21 1500 01/20/21 1801   01/20/21 1500  cefTRIAXone (ROCEPHIN) 2 g in sodium chloride 0.9 % 100 mL  IVPB        2 g 200 mL/hr over 30 Minutes Intravenous  Once 01/20/21 1449 01/20/21 1641          Subjective: Seen and examined.  Patient's primary nurse at the bedside.  Patient complains of left knee pain.  Rash is improving.  Objective: Vitals:   01/23/21 0939 01/23/21 1326 01/23/21 2018 01/24/21 0415  BP: 134/77 115/63 (!) 161/88 129/76  Pulse: 67 70 77 72  Resp: 19 18 20 20   Temp: (!) 97.4 F (36.3 C) 97.6 F (36.4 C) 98.6 F (37 C) 98.5 F (36.9 C)  TempSrc: Oral Oral Oral Oral  SpO2: 99% 97% 98% 95%  Weight:      Height:        Intake/Output Summary (Last 24 hours) at 01/24/2021 1101 Last data filed at 01/24/2021 0900 Gross per 24 hour  Intake 712 ml  Output --  Net 712 ml    Filed Weights   01/20/21 1320  Weight: 123.4 kg    Examination:  General exam: Appears calm and comfortable  Respiratory system: Clear to auscultation. Respiratory effort normal. Cardiovascular system: S1 & S2 heard, RRR. No JVD, murmurs, rubs, gallops or clicks. No pedal edema. Gastrointestinal system: Abdomen is nondistended, soft and nontender. No organomegaly or masses felt. Normal bowel sounds heard. Central nervous system: Alert and oriented. No focal neurological deficits. Extremities: Symmetric 5 x 5 power.  Dressing in the right foot. Skin: Scattered petechial rashes in bilateral lower extremities and somewhat in bilateral upper extremities.  Psychiatry: Judgement and insight appear normal. Mood & affect appropriate.   Data Reviewed: I have personally reviewed following labs and imaging studies  CBC: Recent Labs  Lab 01/20/21 1420 01/21/21 0953 01/24/21 0427  WBC 9.5 10.3 11.2*  NEUTROABS 5.7 7.1 7.5  HGB 14.3 13.9 13.7  HCT 42.9 43.1 42.8  MCV 83.8 87.4 87.3  PLT 265 282 306    Basic Metabolic Panel: Recent Labs  Lab 01/20/21 1420 01/21/21 0953 01/22/21 0521 01/23/21 0518 01/24/21 0427  NA 135 138 139 136 136  K 4.5 4.1 3.4* 4.2 3.8  CL 101 105 107 103  104  CO2 25 26 24 24 25   GLUCOSE 156* 134* 77 121* 97  BUN 16 16 16 15 14   CREATININE 0.67 0.68 0.79 0.86 0.78  CALCIUM 9.8 9.4 9.6 9.5 9.3  MG  --  1.7  --   --   --   PHOS  --  3.6  --   --   --     GFR: Estimated Creatinine Clearance: 139.5 mL/min (by C-G formula based on SCr of 0.78 mg/dL). Liver Function Tests: Recent Labs  Lab 01/21/21 0953  AST 19  ALT 22  ALKPHOS 80  BILITOT 0.5  PROT 7.1  ALBUMIN  3.0*    No results for input(s): LIPASE, AMYLASE in the last 168 hours. No results for input(s): AMMONIA in the last 168 hours. Coagulation Profile: No results for input(s): INR, PROTIME in the last 168 hours. Cardiac Enzymes: No results for input(s): CKTOTAL, CKMB, CKMBINDEX, TROPONINI in the last 168 hours. BNP (last 3 results) No results for input(s): PROBNP in the last 8760 hours. HbA1C: No results for input(s): HGBA1C in the last 72 hours.  CBG: Recent Labs  Lab 01/23/21 1524 01/23/21 2020 01/24/21 0005 01/24/21 0417 01/24/21 0731  GLUCAP 162* 182* 162* 106* 86    Lipid Profile: No results for input(s): CHOL, HDL, LDLCALC, TRIG, CHOLHDL, LDLDIRECT in the last 72 hours. Thyroid Function Tests: No results for input(s): TSH, T4TOTAL, FREET4, T3FREE, THYROIDAB in the last 72 hours.  Anemia Panel: No results for input(s): VITAMINB12, FOLATE, FERRITIN, TIBC, IRON, RETICCTPCT in the last 72 hours. Sepsis Labs: No results for input(s): PROCALCITON, LATICACIDVEN in the last 168 hours.  Recent Results (from the past 240 hour(s))  Culture, blood (routine x 2)     Status: None (Preliminary result)   Collection Time: 01/20/21  3:05 PM   Specimen: BLOOD  Result Value Ref Range Status   Specimen Description BLOOD BLOOD LEFT FOREARM  Final   Special Requests   Final    BOTTLES DRAWN AEROBIC AND ANAEROBIC Blood Culture results may not be optimal due to an excessive volume of blood received in culture bottles   Culture   Final    NO GROWTH 3 DAYS Performed at  [facility], [address], [place], [place] [address]    Report Status PENDING  Incomplete  Culture, blood (routine x 2)     Status: None (Preliminary result)   Collection Time: 01/20/21  3:05 PM   Specimen: BLOOD  Result Value Ref Range Status   Specimen Description BLOOD RIGHT ANTECUBITAL  Final   Special Requests   Final    BOTTLES DRAWN AEROBIC AND ANAEROBIC Blood Culture results may not be optimal due to an inadequate volume of blood received in culture bottles   Culture   Final    NO GROWTH 3 DAYS Performed at [facility], [address], [place], [place] [address]    Report Status PENDING  Incomplete  Resp Panel by RT-PCR (Flu A&B, Covid) Nasopharyngeal Swab     Status: None   Collection Time: 01/20/21  4:50 PM   Specimen: Nasopharyngeal Swab; Nasopharyngeal(NP) swabs in vial transport medium  Result Value Ref Range Status   SARS Coronavirus 2 by RT PCR NEGATIVE NEGATIVE Final    Comment: (NOTE) SARS-CoV-2 target nucleic acids are NOT DETECTED.  The SARS-CoV-2 RNA is generally detectable in upper respiratory specimens during the acute phase of infection. The lowest concentration of SARS-CoV-2 viral copies this assay can detect is 138 copies/mL. A negative result does not preclude SARS-Cov-2 infection and should not be used as the sole basis for treatment or other patient management decisions. A negative result may occur with  improper specimen collection/handling, submission of specimen other than nasopharyngeal swab, presence of viral mutation(s) within the areas targeted by this assay, and inadequate number of viral copies(<138 copies/mL). A negative result must be combined with clinical observations, patient history, and epidemiological information. The expected result is Negative.  Fact Sheet for Patients:  BloggerCourse.com  Fact Sheet for Healthcare Providers:  SeriousBroker.it  This test is no t  yet approved or cleared by the [place] and  has been authorized for detection and/or diagnosis of SARS-CoV-2 by FDA under an Emergency Use Authorization (EUA). This EUA will remain  in effect (meaning this test can be used) for the duration of the COVID-19 declaration under Section 564(b)(1) of the Act, 21 U.S.C.section 360bbb-3(b)(1), unless the authorization is terminated  or revoked sooner.       Influenza A by PCR NEGATIVE NEGATIVE Final   Influenza B by PCR NEGATIVE NEGATIVE Final    Comment: (NOTE) The Xpert Xpress SARS-CoV-2/FLU/RSV plus assay is intended as an aid in the diagnosis of influenza from Nasopharyngeal swab specimens and should not be used as a sole basis for treatment. Nasal washings and aspirates are unacceptable for Xpert Xpress SARS-CoV-2/FLU/RSV testing.  Fact Sheet for Patients: BloggerCourse.com  Fact Sheet for Healthcare Providers: SeriousBroker.it  This test is not yet approved or cleared by the [place] FDA and has been authorized for detection and/or diagnosis of SARS-CoV-2 by FDA under an Emergency Use Authorization (EUA). This EUA will remain in effect (meaning this test can be used) for the duration of the COVID-19 declaration under Section 564(b)(1) of the Act, 21 U.S.C. section 360bbb-3(b)(1), unless the authorization is terminated or revoked.  Performed at [facility], [address], [place], [place] [address]        Radiology Studies: DG Foot 2 Views Right  Result Date: 01/23/2021 CLINICAL DATA:  Post right great toe amputation. EXAM: RIGHT FOOT - 2 VIEW COMPARISON:  Right great toe radiographs-01/20/2021 FINDINGS: Post amputation of the great toe and distal end of the first metatarsal Expected stranding about the operative site. No radiopaque foreign body. No subcutaneous emphysema. No residual areas of osteolysis to suggest osteomyelitis. Mild  degenerative change of several midfoot articulations. No discrete erosions. Tiny plantar calcaneal spur. Minimal enthesopathic change involving the Achilles tendon insertion site. Additionally, linear calcifications are seen with the expected location of the distal fibers of the Achilles tendon. IMPRESSION: Post amputation the great toe and distal end of the first metatarsal without evidence of complication. Electronically Signed   By: Simonne Come M.D.   On: 01/23/2021 10:34    Scheduled Meds:  atorvastatin  80 mg Oral QHS   diclofenac Sodium  2 g Topical QID   insulin aspart  0-9 Units Subcutaneous Q4H   insulin glargine-yfgn  100 Units Subcutaneous QHS   lisinopril  10 mg Oral Daily   metroNIDAZOLE  500 mg Oral BID   nicotine  21 mg Transdermal Daily   potassium chloride  40 mEq Oral Once   sodium chloride flush  3 mL Intravenous Q12H   Continuous Infusions:  sodium chloride     cefTRIAXone (ROCEPHIN)  IV 2 g (01/23/21 1640)   vancomycin 1,500 mg (01/24/21 0601)     LOS: 4 days   Time spent: 27 minutes   Hughie Closs, MD [facility]  01/24/2021, 11:01 AM   How to contact the [facility] Attending or Consulting provider 7A - 7P or covering provider during after hours 7P -7A, for this patient?  Check the care team in [facility] and look for a) attending/consulting [facility] provider listed and b) the [facility] team listed. Page or secure chat 7A-7P. Log into [email or web address] and use [facility]'s universal password to access. If you do not have the password, please contact the hospital operator. Locate the [facility] provider you are looking for under [facility] and page to a number that you can be directly reached. If you still have difficulty reaching the provider, please  page the [facility] (Director on Call) for the Hospitalists listed on amion for assistance.

## 2021-01-24 NOTE — Progress Notes (Signed)
Subjective: POD # 1 s/p right partial 1st ray amputation. He is not having any pain to the right foot. He has been having pain to the left knee which started before surgery yesterday. He has a history of gout he reports. He has put weight on the foot three times to go the bathroom. He did not want to use the bedside commode. The darco wedge shoe will not be available until tomorrow. Denies any fevers, chills, chest pain, SOB. No other concerns.    Objective: AAO x3, NAD DP/PT pulses palpable bilaterally, CRT less than 3 seconds Right foot: Bandage is clean, dry, intact with mild strikethrough of bloody drainage under the Kerlix but not through the Ace bandage.  Upon changing the bandage incisions well coapted with sutures intact.  Small amount of macerated tissue along the lateral aspect of the incision but there is no other skin breakdown.  There is no active bleeding.  There is no purulence.  Faint erythema still residual along the lateral aspect of the incision as well but overall the erythema is much improved.  No ascending cellulitis. No pain with calf compression, swelling, warmth, erythema Rash still noted bilateral legs from yesterday but improved.  Complain of the left knee pain.  Assessment: POD #1 s/p right partial first ray amputation  Plan: White count mildly elevated today but likely postsurgical.  He is afebrile.  I change the bandage today.  I cleaned the incision and Xeroform was applied followed by dry sterile dressing.  Weightbearing as tolerated to the heel in Darco wedge shoe.  Recommended use of bedside commode until he gets his Darco wedge shoe to go to the bathroom.  Encouraged elevation.  Likely stable for discharge tomorrow with 1 week of oral antibiotics, Keflex.  Podiatry will continue to follow.  -Recommend x-ray left knee. RN states she was going to talk to he hospitalist about it.   Ovid Curd, DPM

## 2021-01-24 NOTE — Op Note (Addendum)
PATIENT:  Martin [name]  55 y.o. male   PRE-OPERATIVE DIAGNOSIS:  osteomyelitis   POST-OPERATIVE DIAGNOSIS:  osteomyelitis   PROCEDURE:  Procedure(s): Partial first ray amputation, right   SURGEON:  Surgeon(s) and Role:    * Vivi Barrack, DPM - Primary   PHYSICIAN ASSISTANT:    ASSISTANTS: none    ANESTHESIA:   general   EBL:  10 mL    BLOOD ADMINISTERED:none   DRAINS: none    LOCAL MEDICATIONS USED:  OTHER 20 cc lidocaine and marcaine plain   SPECIMEN:  Source of Specimen:  toe for pathology   DISPOSITION OF SPECIMEN:  PATHOLOGY   COUNTS:  YES   TOURNIQUET:  * Missing tourniquet times found for documented tourniquets in log: [id number] *   DICTATION: .Reubin Milan Dictation   PLAN OF CARE: Admit to inpatient    PATIENT DISPOSITION:  PACU - hemodynamically stable.   Delay start of Pharmacological VTE agent (>24hrs) due to surgical blood loss or risk of bleeding: no  Indications for surgery: 55 year old male is admitted to the hospital for worsening infection of his right big toe.  He presented seen by Dr. Logan Bores for wound of the tip of the toe and it worsened despite 2 rounds of antibiotics since admitted to the hospital for worsening infection.  X-ray and MRI confirmed osteomyelitis.  Given this we discussed limb [name] versus amputation and I believe that amputation is his best option and he wishes to proceed with this as well.  We discussed alternatives, risks, complications.  No promises or guarantees been effective the procedure all questions answered the best my ability.  Procedure in detail: The patient was both verbally and visually identified by myself, the nursing staff, the anesthesia staff in the preoperative holding area.  He was then transferred to the operative room via stretcher where the surgery took place.  LMA was placed.  The right calf tourniquet was applied making sure to pad all bony prominences.  Of note this was not inflated during the procedure.   Right lower extremity and scrubbed, prepped, draped in normal sterile fashion.  Timeout was performed.  A mixture of 20 cc of lidocaine, Marcaine plain was infiltrated in a regional block fashion.  At this time incision was made around the first MPJ and was made with a #15 with scalpel from skin to bone.  At this time the first metatarsal phalangeal joint was identified and soft tissue structures are freed and the toe was disarticulated and sent to pathology.  At this point as debrided nonviable devitalized tissue.  There was not enough skin for closure of the wound after I debrided all the nonviable tissue.  There was found to be some tracking along the midfoot plantarly but there is no purulence noted in this area.  This time of the procedure with resection of the first metatarsal head.  A sagittal bone saw was utilized to resect this.  At this point the remaining metatarsal appeared to be viable.  It was hard in nature and white in color.  I debrided the tissue down to healthy, bleeding tissue and there is no purulence or any remnants of any infection noted.  I copiously irrigated the wound with saline.  Incision was then closed with 3-0 nylon in a simple interrupted suture fashion.  Xeroform was applied followed by a dry sterile dressing.  He was awoken from anesthesia and found to tolerate the procedure well and complications.  He was transferred to PACU with vital  signs stable and vascular status intact.  Ovid Curd, DPM

## 2021-01-24 NOTE — Evaluation (Signed)
Occupational Therapy Evaluation Patient Details Name: Martin [name] MRN: [id number] DOB: September 11, 1965 Today's Date: 01/24/2021    History of Present Illness 55 y.o. male with medical history significant of DM2, HTN, HLD presented to ED with worsening right toe infection.  X-ray shows osteomyelitis. Pt is s/p for R great toe amp 8/20.   Clinical Impression   Patient is currently requiring assistance with ADLs including minimal to moderate assist with toileting, min-moderate assist with LE dressing, minimal assist with seated sponge bathing, and setup assist with seated UE dressing and grooming, all of which is below patient's typical baseline of being Independent.  During this evaluation, patient was limited by weight bearing restrictions to RT Foot without available Darco Wedge shoe in room as well as LT knee pain, and agitation/anxiety which has the potential to impact patient's safety and independence during functional mobility, as well as performance for ADLs. [employer] AM-PAC "6-clicks" Daily Activity Inpatient Short Form score of 17/24 this session. Patient lives with his spouse, who is able to provide PRN supervision and assistance as she works three 12 hour shifts as a Engineer, civil (consulting).  Patient demonstrates good rehab potential especially once the Wedge shoe is available which will allow him PWB through his RT heel, and should benefit from continued skilled occupational therapy services while in acute care to maximize safety, independence and quality of life at home.  ?     Follow Up Recommendations  No OT follow up;Follow surgeon's recommendation for DC plan and follow-up therapies    Equipment Recommendations  3 in 1 bedside commode;Tub/shower bench    Recommendations for Other Services       Precautions / Restrictions Precautions Precautions: Fall Required Braces or Orthoses: Other Brace Other Brace: Awaiting Darco shoe to room. Pt reports no shoe has been delivered. Brief search  of room confirmed. Alerted RN and Licensed conveyancer who contacted Ortho  tech. Ortho tech informs will have to wait until tomorrow for shoe. Restrictions Weight Bearing Restrictions: Yes RLE Weight Bearing: Partial weight bearing RLE Partial Weight Bearing Percentage or Pounds: No pounds specified. Op note includes: "PWB to heel (in Darco wedge shoe). "      Mobility Bed Mobility Overal bed mobility: Modified Independent                  Transfers Overall transfer level: Needs assistance   Transfers: Sit to/from Stand Sit to Stand: Min assist;From elevated surface         General transfer comment: Pt initiated raising EOB to almost standing height. Pt not following instructions for safety due to apparent agitation. Pt did follow max cues for NWB to RLE due to lack of wedge shoe. Pt unable to take steps due to LT knee pain and bed brought behand pt to sit for safety. Min As to safely descend to EOB.    Balance Overall balance assessment: Needs assistance Sitting-balance support: No upper extremity supported;Feet unsupported Sitting balance-Leahy Scale: Good     Standing balance support: Bilateral upper extremity supported Standing balance-Leahy Scale: Poor Standing balance comment: NWB to RLE until Darco wedge shoe available.                           ADL either performed or assessed with clinical judgement   ADL Overall ADL's : Needs assistance/impaired Eating/Feeding: Independent;Bed level   Grooming: Wash/dry hands;Wash/dry face;Set up;Sitting;Bed level   Upper Body Bathing: Set up;Sitting   Lower Body Bathing: Sitting/lateral  leans;Minimal assistance   Upper Body Dressing : Set up;Sitting   Lower Body Dressing: Sitting/lateral leans;Bed level;Minimal assistance Lower Body Dressing Details (indicate cue type and reason): Rolling side to side in bed. Unable to tolerate standing ADLs due to LT knee pain and loack of Darco wedge shoe available. Toilet  Transfer: RW;Minimal Dentist Details (indicate cue type and reason): Unable to pivot. Please see Mobility section. Toileting- Clothing Manipulation and Hygiene: Moderate assistance;Sitting/lateral lean;Bed level       Functional mobility during ADLs: Supervision/safety;Minimal assistance;Rolling walker       Vision Baseline Vision/History: No visual deficits Patient Visual Report: No change from baseline       Perception     Praxis      Pertinent Vitals/Pain Pain Assessment: 0-10 Pain Score: 10-Worst pain ever Pain Location: LT knee. Pt reports no pain at rest and 10/10 pain standing NWB (due to lack of available Darco wedge shoe) with RW. Pt reports LT knee "is locked up and can't bend", however witnessed pt bending his LT knee repeatedly in bed and at EOB. Pain Intervention(s): Limited activity within patient's tolerance;Monitored during session;Premedicated before session;Repositioned (RN notified.)     Hand Dominance Right   Extremity/Trunk Assessment Upper Extremity Assessment Upper Extremity Assessment: Overall WFL for tasks assessed   Lower Extremity Assessment Lower Extremity Assessment: Defer to PT evaluation   Cervical / Trunk Assessment Cervical / Trunk Assessment: Normal   Communication Communication Communication: No difficulties   Cognition Arousal/Alertness: Awake/alert Behavior During Therapy: Agitated;Restless;Anxious Overall Cognitive Status: Within Functional Limits for tasks assessed                                 General Comments: Pt as OT entered with growing agitation within take questions. Pt with multiple complaints about his stay and hospital experience and needs redirection to focus on safety instructions. Pt expressed anxiety regarding placing full weight through his RT foot when he ambulated to the bathroom 3 times since surgery. Pt expresed concern that he has done something harmful to surgical site.    General Comments       Exercises     Shoulder Instructions      Home Living Family/patient expects to be discharged to:: Private residence Living Arrangements: Spouse/significant other Available Help at Discharge: Available PRN/intermittently Type of Home: House Home Access: Stairs to enter [employer] of Steps: 3   Home Layout: One level     Bathroom Shower/Tub: Chief Strategy Officer: Standard     Home Equipment: None          Prior Functioning/Environment Level of Independence: Independent        Comments: works for [employer] in Par[employer]ic Problem List: Pain;Impaired balance (sitting and/or standing);Decreased knowledge of precautions;Decreased knowledge of use of DME or AE      OT Treatment/Interventions: Self-care/ADL training;Therapeutic activities;DME and/or AE instruction;Patient/family education;Balance training    OT Goals(Current goals can be found in the care plan section) Acute Rehab OT Goals Patient Stated Goal: home Monday. "Have a doctor take my left knee pain seriously and not blow me off." OT Goal Formulation: With patient Time For Goal Achievement: 02/07/21 Potential to Achieve Goals: Good ADL Goals Pt Will Perform Lower Body Dressing: sit to/from stand;with modified independence (PWB to heel with Darco wedege shoe) Pt Will Transfer to Toilet: with modified independence;ambulating;bedside commode (PWB to  heel with Darco wedege shoe) Pt Will Perform Toileting - Clothing Manipulation and hygiene: with modified independence;sitting/lateral leans;sit to/from stand (PWB to heel with Darco wedege shoe)  OT Frequency: Min 2X/week   Barriers to D/C: Decreased caregiver support  3 steps to enter home. Wife and son PRN availability       Co-evaluation              AM-PAC OT "6 Clicks" Daily Activity     Outcome Measure Help from another person eating meals?: None Help from another person taking care of  personal grooming?: A Little Help from another person toileting, which includes using toliet, bedpan, or urinal?: A Lot Help from another person bathing (including washing, rinsing, drying)?: A Little Help from another person to put on and taking off regular upper body clothing?: A Little Help from another person to put on and taking off regular lower body clothing?: A Lot 6 Click Score: 17   End of Session Equipment Utilized During Treatment: Rolling walker Nurse Communication: Mobility status;Other (comment);Weight bearing status (Need of PWB to heel with Darco wedege shoe. No shoe in room.)  Activity Tolerance: Patient limited by pain;Treatment limited secondary to agitation Patient left: in bed;with call bell/phone within reach  OT Visit Diagnosis: Pain;Other abnormalities of gait and mobility (R26.89) Pain - Right/Left: Left Pain - part of body: Knee                Time: 0934-1000 OT Time Calculation (min): 26 min Charges:  OT General Charges $OT Visit: 1 Visit OT Evaluation $OT Eval Low Complexity: 1 Low OT Treatments $Therapeutic Activity: 8-22 mins  Victorino Dike, OT Acute Rehab Services Office: [phone] 01/24/2021  Theodoro Clock 01/24/2021, 10:19 AM

## 2021-01-24 NOTE — Progress Notes (Signed)
PT Cancellation Note  Patient Details Name: Martin [name] MRN: [id number] DOB: [birth year]   Cancelled Treatment:    Reason Eval/Treat Not Completed: Other (comment). Deferred PT d/t pt does not have his Darco shoe, contacted ortho tech and have not heard back. RN states she will call them again. Discussed with pt    [facility] 01/24/2021, 12:42 PM

## 2021-01-25 ENCOUNTER — Other Ambulatory Visit (HOSPITAL_COMMUNITY): Payer: Self-pay

## 2021-01-25 ENCOUNTER — Ambulatory Visit: Payer: 59 | Admitting: Podiatry

## 2021-01-25 ENCOUNTER — Encounter: Payer: Self-pay | Admitting: Podiatry

## 2021-01-25 LAB — CBC WITH DIFFERENTIAL/PLATELET
Abs Immature Granulocytes: 0.12 10*3/uL — ABNORMAL HIGH (ref 0.00–0.07)
Basophils Absolute: 0 10*3/uL (ref 0.0–0.1)
Basophils Relative: 0 %
Eosinophils Absolute: 0.5 10*3/uL (ref 0.0–0.5)
Eosinophils Relative: 4 %
HCT: 40.1 % (ref 39.0–52.0)
Hemoglobin: 12.9 g/dL — ABNORMAL LOW (ref 13.0–17.0)
Immature Granulocytes: 1 %
Lymphocytes Relative: 13 %
Lymphs Abs: 1.4 10*3/uL (ref 0.7–4.0)
MCH: 27.8 pg (ref 26.0–34.0)
MCHC: 32.2 g/dL (ref 30.0–36.0)
MCV: 86.4 fL (ref 80.0–100.0)
Monocytes Absolute: 1.2 10*3/uL — ABNORMAL HIGH (ref 0.1–1.0)
Monocytes Relative: 10 %
Neutro Abs: 8.2 10*3/uL — ABNORMAL HIGH (ref 1.7–7.7)
Neutrophils Relative %: 72 %
Platelets: 282 10*3/uL (ref 150–400)
RBC: 4.64 MIL/uL (ref 4.22–5.81)
RDW: 12.6 % (ref 11.5–15.5)
WBC: 11.4 10*3/uL — ABNORMAL HIGH (ref 4.0–10.5)
nRBC: 0 % (ref 0.0–0.2)

## 2021-01-25 LAB — CULTURE, BLOOD (ROUTINE X 2)
Culture: NO GROWTH
Culture: NO GROWTH

## 2021-01-25 LAB — GLUCOSE, CAPILLARY
Glucose-Capillary: 100 mg/dL — ABNORMAL HIGH (ref 70–99)
Glucose-Capillary: 106 mg/dL — ABNORMAL HIGH (ref 70–99)
Glucose-Capillary: 180 mg/dL — ABNORMAL HIGH (ref 70–99)

## 2021-01-25 MED ORDER — FREESTYLE LANCETS MISC
0 refills | Status: DC
  Filled 2021-01-25: qty 100, 25d supply, fill #0

## 2021-01-25 MED ORDER — CEPHALEXIN 500 MG PO CAPS: 500.0000 mg | ORAL_CAPSULE | Freq: Four times a day (QID) | ORAL | 0 refills | Status: DC

## 2021-01-25 MED ORDER — GLUCOSE BLOOD VI STRP
ORAL_STRIP | 0 refills | Status: DC
  Filled 2021-01-25: qty 100, 25d supply, fill #0

## 2021-01-25 MED ORDER — BLOOD GLUCOSE MONITOR SYSTEM W/DEVICE KIT
PACK | 0 refills | Status: DC
  Filled 2021-01-25: qty 1, 1d supply, fill #0

## 2021-01-25 MED ORDER — OXYCODONE HCL 5 MG PO TABS
5.0000 mg | ORAL_TABLET | ORAL | 0 refills | Status: DC | PRN
  Filled 2021-01-25: qty 15, 3d supply, fill #0

## 2021-01-25 MED ORDER — CEPHALEXIN 500 MG PO CAPS
500.0000 mg | ORAL_CAPSULE | Freq: Four times a day (QID) | ORAL | 0 refills | Status: DC
Start: 2021-01-25 — End: 2021-05-03
  Filled 2021-01-25: qty 28, 7d supply, fill #0

## 2021-01-25 NOTE — Progress Notes (Signed)
Physical Therapy Treatment Patient Details Name: Martin [name] MRN: [id number] DOB: 26-Dec-1965 Today's Date: 01/25/2021    History of Present Illness 55 y.o. male with medical history significant of DM2, HTN, HLD presented to ED with worsening right toe infection.  X-ray shows osteomyelitis. Pt is s/p for R great toe amp 8/20.    PT Comments    Pt c/o L knee pain, still wanting x-ray, reports doctor told him "it's still from being in bed. Pt without facial wincing with palpation to L knee joint line, does have wincing with LAQ but able to perform 5 reps without difficulty. Pt educated on step to gait pattern, weight-bearing on R heel per podiatry f/u note and using RW. Pt ambulates without fatigue or pain complaints. Pt ascends/descends 1 step sideways with single handrail and step to pattern, limited by L knee pain, min guard for safety. Pt returned to room, seated EOB, provided written/illustrated stair training instructions and notified RN of pt's pain medication request.    Follow Up Recommendations  No PT follow up     Equipment Recommendations       Recommendations for Other Services       Precautions / Restrictions Precautions Precautions: Fall Required Braces or Orthoses: Other Brace Other Brace: Darco shoe delivered to room per therapist and RN request Restrictions Weight Bearing Restrictions: Yes RLE Weight Bearing: Weight bearing as tolerated RLE Partial Weight Bearing Percentage or Pounds: Per podiatry f/u note "Weightbearing as tolerated to the heel in Darco wedge shoe."    Mobility  Bed Mobility  General bed mobility comments: seated EOB    Transfers Overall transfer level: Needs assistance   Transfers: Sit to/from Stand Sit to Stand: Supervision  General transfer comment: powers to stand from elevated bed, supv for safety with VCs to weightbear only on R heel, BUE assisting to power up  Ambulation/Gait Ambulation/Gait assistance: Supervision Gait  Distance (Feet): 200 Feet Assistive device: Rolling walker (2 wheeled) Gait Pattern/deviations: Step-to pattern Gait velocity: decreased   General Gait Details: VCs for weight-bearing on R heel only, step to pattern with decreased R weight-shift   Stairs Stairs: Yes Stairs assistance: Min guard Stair Management: Step to pattern;Sideways Number of Stairs: 1 General stair comments: educated on sequencing, using R handrail to rise and L handrail to descend, min guard due to L knee pain, sidestepping up/down stairs with R heel WB only   Wheelchair Mobility    Modified Rankin (Stroke Patients Only)       Balance Overall balance assessment: Mild deficits observed, not formally tested       Cognition Arousal/Alertness: Awake/alert Behavior During Therapy: WFL for tasks assessed/performed Overall Cognitive Status: Within Functional Limits for tasks assessed       Exercises      General Comments        Pertinent Vitals/Pain Pain Assessment: 0-10 Pain Score:  ("12") Pain Location: L knee Pain Descriptors / Indicators: Sharp;Aching Pain Intervention(s): Limited activity within patient's tolerance;Monitored during session;Repositioned;Patient requesting pain meds-RN notified    Home Living                      Prior Function            PT Goals (current goals can now be found in the care plan section) Acute Rehab PT Goals Patient Stated Goal: home Monday PT Goal Formulation: With patient Time For Goal Achievement: 01/29/21 Potential to Achieve Goals: Good Progress towards PT goals: Progressing toward goals  Frequency    Min 3X/week      PT Plan Current plan remains appropriate    Co-evaluation              AM-PAC PT "6 Clicks" Mobility   Outcome Measure  Help needed turning from your back to your side while in a flat bed without using bedrails?: None Help needed moving from lying on your back to sitting on the side of a flat bed  without using bedrails?: None Help needed moving to and from a bed to a chair (including a wheelchair)?: A Little Help needed standing up from a chair using your arms (e.g., wheelchair or bedside chair)?: A Little Help needed to walk in hospital room?: A Little Help needed climbing 3-5 steps with a railing? : A Little 6 Click Score: 20    End of Session Equipment Utilized During Treatment: Gait belt Activity Tolerance: Patient tolerated treatment well Patient left: in bed;with call bell/phone within reach Nurse Communication: Mobility status;Patient requests pain meds PT Visit Diagnosis: Difficulty in walking, not elsewhere classified (R26.2)     Time: 0300-9233 PT Time Calculation (min) (ACUTE ONLY): 26 min  Charges:  $Gait Training: 23-37 mins                      Tori Leigh Blas PT, DPT 01/25/21, 10:27 AM

## 2021-01-25 NOTE — Progress Notes (Signed)
Attempted to call ortho tech x3 this AM for ortho shoe with no answer.

## 2021-01-25 NOTE — Progress Notes (Signed)
Results for ZIYON, SOLTAU (MRN [id number]) as of 01/25/2021 09:04  Ref. Range 01/24/2021 16:31 01/24/2021 20:25 01/24/2021 23:55 01/25/2021 04:32 01/25/2021 07:37  Glucose-Capillary Latest Ref Range: 70 - 99 mg/dL 370 (H) 488 (H) 891 (H) 100 (H) 106 (H)  Noted that blood sugars have been less than 100 mg/dl.   Recommend changing Novolog SENSITIVE correction scale to TID with Novolog 0-5 units correction scale at HS since patient is eating.   Will continue to monitor blood sugars while in the hospital.  Smith Mince RN BSN CDE Diabetes Coordinator Pager: [phone]  8am-5pm

## 2021-01-25 NOTE — Discharge Summary (Signed)
Physician Discharge Summary  [name] ERX:[id number] DOB: 1966/06/06 DOA: 01/20/2021  PCP: Patient, No Pcp Per (Inactive)  Admit date: 01/20/2021 Discharge date: 01/25/2021 30 Day Unplanned Readmission Risk Score    Flowsheet Row ED to Hosp-Admission (Current) from 01/20/2021 in [facility]  30 Day Unplanned Readmission Risk Score (%) 8.07 Filed at 01/25/2021 0801       This score is the patient's risk of an unplanned readmission within 30 days of being discharged (0 -100%). The score is based on dignosis, age, lab data, medications, orders, and past utilization.   Low:  0-14.9   Medium: 15-21.9   High: 22-29.9   Extreme: 30 and above          Admitted From: Home Disposition: Home  Recommendations for Outpatient Follow-up:  Follow up with PCP in 1-2 weeks Please obtain BMP/CBC in one week Follow-up with podiatry in 1 week Please follow up with your PCP on the following pending results: Unresulted Labs (From admission, onward)    None         Home Health: None Equipment/Devices: Walker  Discharge Condition: Stable CODE STATUS: Full code Diet recommendation: Diabetic  Subjective: Seen and examined.  No complaints.  Excited to go home.  Brief/Interim Summary: Martin [name] is a 55 y.o. male with medical history significant of DM2, HTN, HLD presented to ED with worsening right toe infection.  He previously had been on 10 days of doxycycline without any improvement, he called the office and was switched to clindamycin on Monday.  Despite of this infection got worse and so he came to the ED.  X-ray shows osteomyelitis.  Admitted with IV antibiotics and podiatry consulted. MRI confirmed first distal phalanx osteomyelitis with overlying cellulitis.  Underwent first ray amputation on 01/23/2021.  Podiatry recommended continuing IV antibiotics for 48 hours and discharge today/Monday with 7 days of oral Keflex.  Of note, patient did  complain of some left knee pain but on examination it was nontender, no warmth and no swelling so there was no indication of any imaging studies.  He is not complaining of any knee pain today.  Patient also developed petechial rash after he was washed with chlorhexidine before the surgery.  His rash is also improving.  Patient was evaluated by PT OT and they recommended walker.  He is being discharged in stable condition and will follow with PCP and podiatry.  Discharge Diagnoses:  Active Problems:   Essential hypertension, benign   Type 2 diabetes mellitus (HCC)   Hypertension   Osteomyelitis of great toe of right foot [facility])    Discharge Instructions   Allergies as of 01/25/2021   No Known Allergies      Medication List     STOP taking these medications    clindamycin 300 MG capsule Commonly known as: Cleocin   doxycycline 100 MG tablet Commonly known as: VIBRA-TABS       TAKE these medications    atorvastatin 80 MG tablet Commonly known as: LIPITOR TAKE 1 TABLET BY MOUTH ONCE A DAY AT BEDTIME What changed: Another medication with the same name was removed. Continue taking this medication, and follow the directions you see here.   cephALEXin 500 MG capsule Commonly known as: KEFLEX Take 1 capsule (500 mg total) by mouth 4 (four) times daily for 7 days.   gabapentin 300 MG capsule Commonly known as: Neurontin Take 1 capsule (300 mg total) by mouth 3 (three) times daily as  needed.   gentamicin cream 0.1 % Commonly known as: GARAMYCIN Apply 1 application topically 3 (three) times daily.   Jardiance 25 MG Tabs tablet Generic drug: empagliflozin Take 1 tablet (25 mg total) by mouth daily.   lisinopril 10 MG tablet Commonly known as: ZESTRIL TAKE 1 TABLET BY MOUTH ONCE DAILY. What changed: Another medication with the same name was removed. Continue taking this medication, and follow the directions you see here.   metFORMIN 500 MG 24 hr tablet Commonly known as:  GLUCOPHAGE-XR TAKE 2 TABLETS BY MOUTH WITH A MEAL TWICE A DAY.   OneTouch Verio test strip Generic drug: glucose blood USE AS INSTRUCTED 2-3 X A DAY.   sildenafil 100 MG tablet Commonly known as: Viagra Take 1 tablet (100 mg total) by mouth up to once daily as needed   tiZANidine 4 MG tablet Commonly known as: ZANAFLEX TAKE 1 TABLET BY MOUTH EVERY 6 TO 8 HOURS AS NEEDED FOR MUSCLE SPASMS AND PAIN.   Evaristo Bury FlexTouch 200 UNIT/ML FlexTouch Pen Generic drug: insulin degludec INJECT 160 UNITS UNDER THE SKIN ONCE A DAY What changed:  how much to take how to take this when to take this additional instructions   Unifine Pentips 31G X 6 MM Misc Generic drug: Insulin Pen Needle USE AS DIRECTED ONCE A DAY   Unifine Pentips 31G X 6 MM Misc Generic drug: Insulin Pen Needle USE TO INJECT INSULIN TWICE A DAY               Durable Medical Equipment  (From admission, onward)           Start     Ordered   01/24/21 1102  For home use only DME Bedside commode  Once       Question:  Patient needs a bedside commode to treat with the following condition  Answer:  Balance problem   01/24/21 1101   01/24/21 1102  For home use only DME Shower stool  Once        01/24/21 1101            No Known Allergies  Consultations: Podiatry  Procedures/Studies: MR FOOT RIGHT WO CONTRAST  Result Date: 01/22/2021 CLINICAL DATA:  Great toe osteomyelitis. EXAM: MRI OF THE RIGHT FOREFOOT WITHOUT CONTRAST TECHNIQUE: Multiplanar, multisequence MR imaging of the right forefoot was performed. No intravenous contrast was administered. COMPARISON:  None. FINDINGS: Bones/Joint/Cartilage Soft tissue wound of the tip of the great toe. Severe bone marrow edema involving the entirety of the first distal phalanx with cortical destruction consistent with osteomyelitis. No other areas of bone destruction. Mild bone marrow edema in the medial hallux sesamoid likely reflecting sesamoiditis. Normal  alignment. No joint effusion. Moderate osteoarthritis of the second tarsometatarsal joint. Ligaments Collateral ligaments are intact.  Lisfranc ligament is intact. Muscles and Tendons Flexor, peroneal and extensor compartment tendons are intact. T2 hyperintense signal throughout the plantar musculature likely neurogenic. Soft tissue No fluid collection or hematoma. No soft tissue mass. Soft tissue edema along the dorsal aspect of the foot extending into the great toe consistent with cellulitis. IMPRESSION: 1. Soft tissue wound at the tip of the great toe. Osteomyelitis of the first distal phalanx. Cellulitis of the great toe extending into the dorsal aspect of the foot. Electronically Signed   By: Elige Ko M.D.   On: 01/22/2021 08:02   DG Foot 2 Views Right  Result Date: 01/23/2021 CLINICAL DATA:  Post right great toe amputation. EXAM: RIGHT FOOT - 2 VIEW  COMPARISON:  Right great toe radiographs-01/20/2021 FINDINGS: Post amputation of the great toe and distal end of the first metatarsal Expected stranding about the operative site. No radiopaque foreign body. No subcutaneous emphysema. No residual areas of osteolysis to suggest osteomyelitis. Mild degenerative change of several midfoot articulations. No discrete erosions. Tiny plantar calcaneal spur. Minimal enthesopathic change involving the Achilles tendon insertion site. Additionally, linear calcifications are seen with the expected location of the distal fibers of the Achilles tendon. IMPRESSION: Post amputation the great toe and distal end of the first metatarsal without evidence of complication. Electronically Signed   By: Simonne ComeJohn  Watts M.D.   On: 01/23/2021 10:34   DG Toe Great Right  Result Date: 01/20/2021 CLINICAL DATA:  Ulcer. EXAM: RIGHT GREAT TOE COMPARISON:  None. FINDINGS: Lytic destruction is seen involving the distal tuft of the first distal phalanx concerning for osteomyelitis. Overlying soft tissue ulceration is noted. Joint spaces are  intact. IMPRESSION: Lytic destruction seen involving distal tuft of first distal phalanx consistent with osteomyelitis. Electronically Signed   By: Lupita RaiderJames  Green Jr M.D.   On: 01/20/2021 14:44   VAS US ABI WITH/WO TBI  Result Date: 01/21/2021  LOWER EXTREMITY DOPPLER STUDY Patient Name:  Doreen SalvageJonathan G Hulen  Date of Exam:   01/21/2021 Medical Rec #: [id number]         Accession #:    [id number] Date of Birth: [birth year]         Patient Gender: M Patient Age:   [age] years [facility] Procedure:      VAS US ABI WITH/WO TBI Referring Phys: Jonny RuizANASTASSIA DOUTOVA --------------------------------------------------------------------------------  Indications: Ulceration. High Risk Factors: Hypertension, Diabetes.  Limitations: Today's exam was limited due to an open wound and involuntary              patient movement. Comparison Study: No prior studies. Performing Technologist: Olen Cordialollins, Greg RVT  Examination Guidelines: A complete evaluation includes at minimum, Doppler waveform signals and systolic blood pressure reading at the level of bilateral brachial, anterior tibial, and posterior tibial arteries, when vessel segments are accessible. Bilateral testing is considered an integral part of a complete examination. Photoelectric Plethysmograph (PPG) waveforms and toe systolic pressure readings are included as required and additional duplex testing as needed. Limited examinations for reoccurring indications may be performed as noted.  ABI Findings: +---------+------------------+-----+---------+--------+ Right    Rt Pressure (mmHg)IndexWaveform Comment  +---------+------------------+-----+---------+--------+ Brachial 126                    triphasic         +---------+------------------+-----+---------+--------+ PTA      138               1.10 triphasic         +---------+------------------+-----+---------+--------+ DP       141               1.12 triphasic          +---------+------------------+-----+---------+--------+ Great Toe                                Ulcer    +---------+------------------+-----+---------+--------+ +---------+------------------+-----+---------+-------+ Left     Lt Pressure (mmHg)IndexWaveform Comment +---------+------------------+-----+---------+-------+ Brachial 109                    triphasic        +---------+------------------+-----+---------+-------+ PTA      138  1.10 triphasic        +---------+------------------+-----+---------+-------+ DP       131               1.04 triphasic        +---------+------------------+-----+---------+-------+ Great Toe94                0.75                  +---------+------------------+-----+---------+-------+ +-------+-----------+-----------+------------+------------+ ABI/TBIToday's ABIToday's TBIPrevious ABIPrevious TBI +-------+-----------+-----------+------------+------------+ Right  1.12                                           +-------+-----------+-----------+------------+------------+ Left   1.1        0.75                                +-------+-----------+-----------+------------+------------+  Summary: Right: Resting right ankle-brachial index is within normal range. No evidence of significant right lower extremity arterial disease. Unable to obtain TBI due to great toe ulceration. Left: Resting left ankle-brachial index is within normal range. No evidence of significant left lower extremity arterial disease. The left toe-brachial index is normal.  *See table(s) above for measurements and observations.  Electronically signed by Lemar Livings MD on 01/21/2021 at 1:37:26 PM.    Final      Discharge Exam: Vitals:   01/24/21 2027 01/25/21 0430  BP: (!) 157/90 (!) 147/81  Pulse: 87 70  Resp: 16 15  Temp: 98.4 F (36.9 C) 98.3 F (36.8 C)  SpO2: 99% 98%   Vitals:   01/24/21 0415 01/24/21 1330 01/24/21 2027 01/25/21 0430  BP:  129/76 137/83 (!) 157/90 (!) 147/81  Pulse: 72 65 87 70  Resp: Temp: 98.5 F (36.9 C) 97.8 F (36.6 C) 98.4 F (36.9 C) 98.3 F (36.8 C)  TempSrc: Oral Oral Oral Oral  SpO2: 95% 98% 99% 98%  Weight:      Height:        General: Pt is alert, awake, not in acute distress Cardiovascular: RRR, S1/S2 +, no rubs, no gallops Respiratory: CTA bilaterally, no wheezing, no rhonchi Abdominal: Soft, NT, ND, bowel sounds + Extremities: no edema, no cyanosis, dressing in the right foot.    The results of significant diagnostics from this hospitalization (including imaging, microbiology, ancillary and laboratory) are listed below for reference.     Microbiology: Recent Results (from the past 240 hour(s))  Culture, blood (routine x 2)     Status: None   Collection Time: 01/20/21  3:05 PM   Specimen: BLOOD  Result Value Ref Range Status   Specimen Description BLOOD BLOOD LEFT FOREARM  Final   Special Requests   Final    BOTTLES DRAWN AEROBIC AND ANAEROBIC Blood Culture results may not be optimal due to an excessive volume of blood received in culture bottles   Culture   Final    NO GROWTH 5 DAYS Performed at [facility], [address] [place], [place] [address]    Report Status 01/25/2021 FINAL  Final  Culture, blood (routine x 2)     Status: None   Collection Time: 01/20/21  3:05 PM   Specimen: BLOOD  Result Value Ref Range Status   Specimen Description BLOOD RIGHT ANTECUBITAL  Final   Special Requests  Final    BOTTLES DRAWN AEROBIC AND ANAEROBIC Blood Culture results may not be optimal due to an inadequate volume of blood received in culture bottles   Culture   Final    NO GROWTH 5 DAYS Performed at [facility], [address], [place], [place] [address]    Report Status 01/25/2021 FINAL  Final  Resp Panel by RT-PCR (Flu A&B, Covid) Nasopharyngeal Swab     Status: None   Collection Time: 01/20/21  4:50 PM   Specimen: Nasopharyngeal Swab;  Nasopharyngeal(NP) swabs in vial transport medium  Result Value Ref Range Status   SARS Coronavirus 2 by RT PCR NEGATIVE NEGATIVE Final    Comment: (NOTE) SARS-CoV-2 target nucleic acids are NOT DETECTED.  The SARS-CoV-2 RNA is generally detectable in upper respiratory specimens during the acute phase of infection. The lowest concentration of SARS-CoV-2 viral copies this assay can detect is 138 copies/mL. A negative result does not preclude SARS-Cov-2 infection and should not be used as the sole basis for treatment or other patient management decisions. A negative result may occur with  improper specimen collection/handling, submission of specimen other than nasopharyngeal swab, presence of viral mutation(s) within the areas targeted by this assay, and inadequate number of viral copies(<138 copies/mL). A negative result must be combined with clinical observations, patient history, and epidemiological information. The expected result is Negative.  Fact Sheet for Patients:  BloggerCourse.com  Fact Sheet for Healthcare Providers:  SeriousBroker.it  This test is no t yet approved or cleared by the [place] FDA and  has been authorized for detection and/or diagnosis of SARS-CoV-2 by FDA under an Emergency Use Authorization (EUA). This EUA will remain  in effect (meaning this test can be used) for the duration of the COVID-19 declaration under Section 564(b)(1) of the Act, 21 U.S.C.section 360bbb-3(b)(1), unless the authorization is terminated  or revoked sooner.       Influenza A by PCR NEGATIVE NEGATIVE Final   Influenza B by PCR NEGATIVE NEGATIVE Final    Comment: (NOTE) The Xpert Xpress SARS-CoV-2/FLU/RSV plus assay is intended as an aid in the diagnosis of influenza from Nasopharyngeal swab specimens and should not be used as a sole basis for treatment. Nasal washings and aspirates are unacceptable for Xpert Xpress  SARS-CoV-2/FLU/RSV testing.  Fact Sheet for Patients: BloggerCourse.com  Fact Sheet for Healthcare Providers: SeriousBroker.it  This test is not yet approved or cleared by the [place] FDA and has been authorized for detection and/or diagnosis of SARS-CoV-2 by FDA under an Emergency Use Authorization (EUA). This EUA will remain in effect (meaning this test can be used) for the duration of the COVID-19 declaration under Section 564(b)(1) of the Act, 21 U.S.C. section 360bbb-3(b)(1), unless the authorization is terminated or revoked.  Performed at [facility], [address] [place], [place] [address]      Labs: BNP (last 3 results) No results for input(s): BNP in the last 8760 hours. Basic Metabolic Panel: Recent Labs  Lab 01/20/21 1420 01/21/21 0953 01/22/21 0521 01/23/21 0518 01/24/21 0427  NA 135 138 139 136 136  K 4.5 4.1 3.4* 4.2 3.8  CL 101 105 107 103 104  CO2 25 26 24 24 25   GLUCOSE 156* 134* 77 121* 97  BUN 16 16 16 15 14   CREATININE 0.67 0.68 0.79 0.86 0.78  CALCIUM 9.8 9.4 9.6 9.5 9.3  MG  --  1.7  --   --   --   PHOS  --  3.6  --   --   --    Liver Function Tests: Recent Labs  Lab 01/21/21 0953  AST 19  ALT 22  ALKPHOS 80  BILITOT 0.5  PROT 7.1  ALBUMIN 3.0*   No results for input(s): LIPASE, AMYLASE in the last 168 hours. No results for input(s): AMMONIA in the last 168 hours. CBC: Recent Labs  Lab 01/20/21 1420 01/21/21 0953 01/24/21 0427 01/25/21 0809  WBC 9.5 10.3 11.2* 11.4*  NEUTROABS 5.7 7.1 7.5 8.2*  HGB 14.3 13.9 13.7 12.9*  HCT 42.9 43.1 42.8 40.1  MCV 83.8 87.4 87.3 86.4  PLT 265 282 306 282   Cardiac Enzymes: No results for input(s): CKTOTAL, CKMB, CKMBINDEX, TROPONINI in the last 168 hours. BNP: Invalid input(s): POCBNP CBG: Recent Labs  Lab 01/24/21 1631 01/24/21 2025 01/24/21 2355 01/25/21 0432 01/25/21 0737  GLUCAP 142* 172* 172*  100* 106*   D-Dimer No results for input(s): DDIMER in the last 72 hours. Hgb A1c No results for input(s): HGBA1C in the last 72 hours. Lipid Profile No results for input(s): CHOL, HDL, LDLCALC, TRIG, CHOLHDL, LDLDIRECT in the last 72 hours. Thyroid function studies No results for input(s): TSH, T4TOTAL, T3FREE, THYROIDAB in the last 72 hours.  Invalid input(s): FREET3 Anemia work up No results for input(s): VITAMINB12, FOLATE, FERRITIN, TIBC, IRON, RETICCTPCT in the last 72 hours. Urinalysis    Component Value Date/Time   COLORURINE YELLOW 04/13/2019 0030   APPEARANCEUR CLEAR 04/13/2019 0030   LABSPEC 1.020 04/13/2019 0030   PHURINE 6.0 04/13/2019 0030   GLUCOSEU >=500 (A) 04/13/2019 0030   HGBUR MODERATE (A) 04/13/2019 0030   BILIRUBINUR NEGATIVE 04/13/2019 0030   KETONESUR NEGATIVE 04/13/2019 0030   PROTEINUR NEGATIVE 04/13/2019 0030   UROBILINOGEN 0.2 06/05/2010 1558   NITRITE NEGATIVE 04/13/2019 0030   LEUKOCYTESUR NEGATIVE 04/13/2019 0030   Sepsis Labs Invalid input(s): PROCALCITONIN,  WBC,  LACTICIDVEN Microbiology Recent Results (from the past 240 hour(s))  Culture, blood (routine x 2)     Status: None   Collection Time: 01/20/21  3:05 PM   Specimen: BLOOD  Result Value Ref Range Status   Specimen Description BLOOD BLOOD LEFT FOREARM  Final   Special Requests   Final    BOTTLES DRAWN AEROBIC AND ANAEROBIC Blood Culture results may not be optimal due to an excessive volume of blood received in culture bottles   Culture   Final    NO GROWTH 5 DAYS Performed at [facility], [address] [place], [place] [address]    Report Status 01/25/2021 FINAL  Final  Culture, blood (routine x 2)     Status: None   Collection Time: 01/20/21  3:05 PM   Specimen: BLOOD  Result Value Ref Range Status   Specimen Description BLOOD RIGHT ANTECUBITAL  Final   Special Requests   Final    BOTTLES DRAWN AEROBIC AND ANAEROBIC Blood Culture results may not be optimal due to  an inadequate volume of blood received in culture bottles   Culture   Final    NO GROWTH 5 DAYS Performed at [facility], [address] [place], [place] [address]    Report Status 01/25/2021 FINAL  Final  Resp Panel by RT-PCR (Flu A&B, Covid) Nasopharyngeal Swab     Status: None   Collection Time: 01/20/21  4:50 PM   Specimen: Nasopharyngeal Swab; Nasopharyngeal(NP) swabs in vial transport medium  Result Value Ref Range Status   SARS Coronavirus 2 by RT PCR  NEGATIVE NEGATIVE Final    Comment: (NOTE) SARS-CoV-2 target nucleic acids are NOT DETECTED.  The SARS-CoV-2 RNA is generally detectable in upper respiratory specimens during the acute phase of infection. The lowest concentration of SARS-CoV-2 viral copies this assay can detect is 138 copies/mL. A negative result does not preclude SARS-Cov-2 infection and should not be used as the sole basis for treatment or other patient management decisions. A negative result may occur with  improper specimen collection/handling, submission of specimen other than nasopharyngeal swab, presence of viral mutation(s) within the areas targeted by this assay, and inadequate number of viral copies(<138 copies/mL). A negative result must be combined with clinical observations, patient history, and epidemiological information. The expected result is Negative.  Fact Sheet for Patients:  BloggerCourse.com  Fact Sheet for Healthcare Providers:  SeriousBroker.it  This test is no t yet approved or cleared by the [place] FDA and  has been authorized for detection and/or diagnosis of SARS-CoV-2 by FDA under an Emergency Use Authorization (EUA). This EUA will remain  in effect (meaning this test can be used) for the duration of the COVID-19 declaration under Section 564(b)(1) of the Act, 21 U.S.C.section 360bbb-3(b)(1), unless the authorization is terminated  or revoked sooner.        Influenza A by PCR NEGATIVE NEGATIVE Final   Influenza B by PCR NEGATIVE NEGATIVE Final    Comment: (NOTE) The Xpert Xpress SARS-CoV-2/FLU/RSV plus assay is intended as an aid in the diagnosis of influenza from Nasopharyngeal swab specimens and should not be used as a sole basis for treatment. Nasal washings and aspirates are unacceptable for Xpert Xpress SARS-CoV-2/FLU/RSV testing.  Fact Sheet for Patients: BloggerCourse.com  Fact Sheet for Healthcare Providers: SeriousBroker.it  This test is not yet approved or cleared by the [place] FDA and has been authorized for detection and/or diagnosis of SARS-CoV-2 by FDA under an Emergency Use Authorization (EUA). This EUA will remain in effect (meaning this test can be used) for the duration of the COVID-19 declaration under Section 564(b)(1) of the Act, 21 U.S.C. section 360bbb-3(b)(1), unless the authorization is terminated or revoked.  Performed at [facility], [address] [place], [place] [address]      Time coordinating discharge: Over 30 minutes  SIGNED:   Hughie Closs, MD  [facility] 01/25/2021, 9:49 AM  If 7PM-7AM, please contact night-coverage [email or web address]

## 2021-01-25 NOTE — Progress Notes (Signed)
Orthopedic Tech Progress Note Patient Details:  Martin [name] 11-04-1965 [id number]  Ortho Devices Type of Ortho Device: Darco shoe Ortho Device/Splint Location: right Ortho Device/Splint Interventions: Application   Post Interventions Patient Tolerated: Well Instructions Provided: Care of device  Saul Fordyce 01/25/2021, 9:23 AM

## 2021-01-25 NOTE — TOC Transition Note (Signed)
Transition of Care [facility]) - CM/SW Discharge Note   Patient Details  Name: Martin [name] MRN: [id number] Date of Birth: May 03, 1966  [facility] of [facility] CM/SW Contact:  Darleene Cleaver, LCSW Phone Number: 01/25/2021, 12:52 PM   Clinical Narrative:     Patient will be going home with rolling walker and bedside commode through [employer].  CSW signing off please reconsult with any other social work needs, home DME agency has been notified of planned discharge.    Final next level of care: Home/Self Care Barriers to Discharge: Barriers Resolved   Patient Goals and CMS Choice Patient states their goals for this hospitalization and ongoing recovery are:: To return back home with a rolling walker and bedside commode. CMS Medicare.gov Compare Post Acute Care list provided to:: Patient Choice offered to / list presented to : Patient  Discharge Placement                       Discharge Plan and Services                DME Arranged: Bedside commode, Walker rolling DME Agency: [employer] Date DME Agency Contacted: 01/25/21 Time DME Agency Contacted: 1030 Representative spoke with at DME Agency: Velna Hatchet            Social Determinants of Health (SDOH) Interventions     Readmission Risk Interventions No flowsheet data found.

## 2021-01-26 ENCOUNTER — Other Ambulatory Visit: Payer: Self-pay | Admitting: *Deleted

## 2021-01-26 ENCOUNTER — Encounter: Payer: Self-pay | Admitting: *Deleted

## 2021-01-26 LAB — SURGICAL PATHOLOGY

## 2021-01-26 NOTE — Patient Outreach (Addendum)
[facility]) Care Management  01/26/2021  Martin [name] 1966-03-22 [id number]   Transition of care call/case closure   Referral received:01/21/21 Initial outreach:01/26/21 Insurance: [employer]    Subjective: Initial successful telephone call to patient's preferred number in order to complete transition of care assessment; 2 HIPAA identifiers verified. Explained purpose of call and completed transition of care assessment.  Martin [name] states that he is doing as good as he can.says surgical area  with dressing in place and  unremarkable, states surgical pain well managed with prescribed medications, tolerating diet, denies bowel or bladder problems.  Spouse is are assisting with his recovery. He report wearing darco shoe and using walker for mobility in home.  Patient discussed concern regarding rash that the states developed after surgery he target after use of wipes in preparation for surgery, he reports rash on legs arms, improving, does not itch.  He voiced concern regarding right knee pain and plans for follow up appointment with orthopedic MD this week as he describes discomfort more in his left knee that foot. He denies recent injury at site.   Reviewed accessing the following [employer] : Patient placed his wife Martin [name] on the phone,  Patient discussed his  ongoing health issues with diabetes but states now he is determined and has began to make changes. He reports being in contact with endocrinologist and has an appointment on this week, patient discussed low blood sugar reading this am of 58, he ate breakfast and rechecked reading at 108. He reports speaking with Dr. Sharl Ma and his and his insulin has been lowered. He is agreeable to referral to [facility] health chronic condition management program. I also discussed nutrition diabetes management classes he will speak with Dr Sharl Ma this week regarding referral.  Wife reports that she does have the hospital indemnity  plan and placed call regarding filing claim  He does use a [facility] outpatient pharmacy at [facility] outpatient pharmacy.      Objective:  Mr. Martin [name] was hospitalized at [facility] Osteomyelitis, Right great toe amputation Comorbidities include: Diabetes, Hemoglobin A1c 11.1 %, Hyperlipidemia He was discharged to home on 01/25/21 without the need for home health services and with DME of rolling walker, bsc .   Assessment:  Patient voices good understanding of all discharge instructions.  See transition of care flowsheet for assessment details.   Plan:  Reviewed hospital discharge diagnosis of Osteomyelitis and Right great toe amputation  and discharge treatment plan using hospital discharge instructions, assessing medication adherence, reviewing problems requiring provider notification, and discussing the importance of follow up with surgeon, primary care provider and/or specialists as directed. Reinforced scheduling follow up with PCP at [facility].   Reviewed [facility] healthy lifestyle program information to receive discounted premium for  2023   Step 1: Get  your annual physical  Step 2: Complete your health assessment  Step 3:Identify your current health status and complete the corresponding action step between June 06, 2020 and February 04, 2021.    Using Active Health Management ActiveAdvice View website, enrolled patient to  participate in [facility] Active Health Management chronic disease management program.    No ongoing care management needs identified so will close case to [facility] Care Management services and route successful outreach letter with [facility] Care Management pamphlet and 24 Hour Nurse Line Magnet to [employer] Care Management clinical pool to be mailed to patient's home address.   Cala Bradford  Durwin Glaze RN, BSN  [facility] Care Management,Care Management Coordinator  [phone]-  Mobile [phone]- Toll Free Main Office

## 2021-01-28 ENCOUNTER — Ambulatory Visit (INDEPENDENT_AMBULATORY_CARE_PROVIDER_SITE_OTHER): Payer: 59

## 2021-01-28 ENCOUNTER — Ambulatory Visit (INDEPENDENT_AMBULATORY_CARE_PROVIDER_SITE_OTHER): Payer: 59 | Admitting: Podiatry

## 2021-01-28 ENCOUNTER — Other Ambulatory Visit (HOSPITAL_COMMUNITY): Payer: Self-pay

## 2021-01-28 ENCOUNTER — Other Ambulatory Visit: Payer: Self-pay

## 2021-01-28 ENCOUNTER — Encounter: Payer: Self-pay | Admitting: Podiatry

## 2021-01-28 DIAGNOSIS — S98139A Complete traumatic amputation of one unspecified lesser toe, initial encounter: Secondary | ICD-10-CM | POA: Diagnosis not present

## 2021-01-28 DIAGNOSIS — E08621 Diabetes mellitus due to underlying condition with foot ulcer: Secondary | ICD-10-CM

## 2021-01-28 DIAGNOSIS — Z794 Long term (current) use of insulin: Secondary | ICD-10-CM | POA: Diagnosis not present

## 2021-01-28 DIAGNOSIS — Z9889 Other specified postprocedural states: Secondary | ICD-10-CM

## 2021-01-28 DIAGNOSIS — L97512 Non-pressure chronic ulcer of other part of right foot with fat layer exposed: Secondary | ICD-10-CM

## 2021-01-28 DIAGNOSIS — Z720 Tobacco use: Secondary | ICD-10-CM | POA: Diagnosis not present

## 2021-01-28 DIAGNOSIS — Z7984 Long term (current) use of oral hypoglycemic drugs: Secondary | ICD-10-CM | POA: Diagnosis not present

## 2021-01-28 DIAGNOSIS — E1165 Type 2 diabetes mellitus with hyperglycemia: Secondary | ICD-10-CM | POA: Diagnosis not present

## 2021-01-28 MED ORDER — JARDIANCE 25 MG PO TABS
25.0000 mg | ORAL_TABLET | Freq: Every day | ORAL | 12 refills | Status: DC
  Filled 2021-01-28: qty 90, 90d supply, fill #0
  Filled 2021-05-09: qty 90, 90d supply, fill #1
  Filled 2021-10-08: qty 90, 90d supply, fill #2

## 2021-01-28 MED ORDER — TRIAMCINOLONE ACETONIDE 0.025 % EX OINT
1.0000 "application " | TOPICAL_OINTMENT | Freq: Two times a day (BID) | CUTANEOUS | 0 refills | Status: DC
  Filled 2021-01-28: qty 30, 15d supply, fill #0

## 2021-01-28 MED ORDER — TRULICITY 0.75 MG/0.5ML ~~LOC~~ SOAJ
0.7500 mg | SUBCUTANEOUS | 12 refills | Status: DC
  Filled 2021-01-28: qty 6, 84d supply, fill #0

## 2021-01-28 MED ORDER — CEPHALEXIN 500 MG PO CAPS
500.0000 mg | ORAL_CAPSULE | Freq: Four times a day (QID) | ORAL | 0 refills | Status: AC
  Filled 2021-01-28 – 2021-02-01 (×2): qty 28, 7d supply, fill #0

## 2021-01-28 NOTE — Progress Notes (Signed)
Subjective: Martin [name] is a 55 y.o. is seen today in office s/p right partial first amputation preformed on 01/23/2021.  States he is doing well and pain is controlled.  Still on Keflex.  He has been using Darco wedge shoe and a walker.  He gets swelling to both of his legs since being in the hospital as well as had a rash on the left side.  He follows up with his primary care physician tomorrow.  He saw his endocrinologist earlier today.  Denies any systemic complaints such as fevers, chills, nausea, vomiting. No calf pain, chest pain, shortness of breath.   Objective: General: No acute distress, AAOx3  DP/PT pulses palpable 2/4, CRT < 3 sec to all digits.  Right foot: Incision is well coapted without any evidence of dehiscence.  Erythema this present previously has resolved.  There is no ascending cellulitis.  No fluctuance crepitation.  No malodor.  Sutures are intact without any dehiscence.  Some dried blood is present.   There is pitting edema present bilaterally.  The rash that was noted during surgery is still present.  There is no open lesions left side there is dried callus with blood underneath the left hallux with hallux malleus.  No edema, erythema to the toe.   No pain with calf compression, swelling, warmth, erythema.   Assessment and Plan:  Status post right partial fourth toe amputation, doing well with no complications   -Treatment options discussed including all alternatives, risks, and complications -X-rays obtained reviewed.  No subacute fracture or osteomyelitis. -Small amount of Betadine was applied to the incision followed by Adaptic and a dry sterile dressing. -Continue weightbearing to the heel and Darco wedge shoe and limit activity.  Encouraged elevation. -Continue cephalexin which I refilled -Prescribed triamcinolone cream for the rash on the left leg -Follow-up with primary care physician for the swelling.  No follow-ups on file.  Vivi Barrack DPM

## 2021-01-29 ENCOUNTER — Other Ambulatory Visit (HOSPITAL_COMMUNITY): Payer: Self-pay

## 2021-01-29 DIAGNOSIS — R600 Localized edema: Secondary | ICD-10-CM | POA: Diagnosis not present

## 2021-01-29 DIAGNOSIS — M25562 Pain in left knee: Secondary | ICD-10-CM | POA: Diagnosis not present

## 2021-01-29 MED ORDER — FUROSEMIDE 20 MG PO TABS
20.0000 mg | ORAL_TABLET | Freq: Every day | ORAL | 0 refills | Status: DC | PRN
  Filled 2021-01-29: qty 10, 10d supply, fill #0

## 2021-01-29 MED ORDER — MELOXICAM 15 MG PO TABS
15.0000 mg | ORAL_TABLET | Freq: Every day | ORAL | 0 refills | Status: DC
  Filled 2021-01-29: qty 30, 30d supply, fill #0

## 2021-02-01 ENCOUNTER — Other Ambulatory Visit (HOSPITAL_COMMUNITY): Payer: Self-pay

## 2021-02-02 ENCOUNTER — Other Ambulatory Visit: Payer: Self-pay

## 2021-02-02 ENCOUNTER — Ambulatory Visit (INDEPENDENT_AMBULATORY_CARE_PROVIDER_SITE_OTHER): Payer: 59 | Admitting: Podiatry

## 2021-02-02 ENCOUNTER — Encounter: Payer: Self-pay | Admitting: Podiatry

## 2021-02-02 DIAGNOSIS — L97512 Non-pressure chronic ulcer of other part of right foot with fat layer exposed: Secondary | ICD-10-CM

## 2021-02-02 DIAGNOSIS — E08621 Diabetes mellitus due to underlying condition with foot ulcer: Secondary | ICD-10-CM

## 2021-02-02 DIAGNOSIS — Z9889 Other specified postprocedural states: Secondary | ICD-10-CM

## 2021-02-09 NOTE — Progress Notes (Signed)
Subjective: Martin [name] is a 55 y.o. is seen today in office s/p right partial first amputation preformed on 01/23/2021.  Presents today for wound check.  He states that he is doing well and not having significant pain.  He states that his blood sugars come down quite a bit.  No fevers or chills.  He has no other concerns today.    Objective: General: No acute distress, AAOx3  DP/PT pulses palpable 2/4, CRT < 3 sec to all digits.  Right foot: Incision is well coapted without any evidence of dehiscence.  There is still some mild edema on the surgical site there is no significant cellulitis or ascending cellulitis.  No drainage or pus.  There is a scab present on the incision.   No pain with calf compression, swelling, warmth, erythema.   Assessment and Plan:  Status post right partial fourth toe amputation, doing well with no complications   -Treatment options discussed including all alternatives, risks, and complications -Incision was cleaned.  A small amount of antibiotic ointment was applied followed by dressing.  Continue with dressing changes.  Continue [place] as much as possible, elevation.  Monitor closely for any signs or symptoms of infection.  Vivi Barrack DPM

## 2021-02-11 ENCOUNTER — Other Ambulatory Visit: Payer: Self-pay

## 2021-02-11 ENCOUNTER — Other Ambulatory Visit (HOSPITAL_COMMUNITY): Payer: Self-pay

## 2021-02-11 ENCOUNTER — Ambulatory Visit (INDEPENDENT_AMBULATORY_CARE_PROVIDER_SITE_OTHER): Payer: 59 | Admitting: Podiatry

## 2021-02-11 DIAGNOSIS — E08621 Diabetes mellitus due to underlying condition with foot ulcer: Secondary | ICD-10-CM

## 2021-02-11 DIAGNOSIS — L97512 Non-pressure chronic ulcer of other part of right foot with fat layer exposed: Secondary | ICD-10-CM

## 2021-02-11 DIAGNOSIS — Z9889 Other specified postprocedural states: Secondary | ICD-10-CM

## 2021-02-11 MED ORDER — AMPICILLIN 500 MG PO CAPS
500.0000 mg | ORAL_CAPSULE | Freq: Four times a day (QID) | ORAL | 0 refills | Status: DC
  Filled 2021-02-11: qty 40, 10d supply, fill #0

## 2021-02-12 ENCOUNTER — Encounter: Payer: Self-pay | Admitting: Sports Medicine

## 2021-02-12 NOTE — Progress Notes (Signed)
Wife call Answering Service stated that she noticed increased bloody drainage and clear to yellow drainage from her husband's right foot surgical site. She reports that her husband has been up on his foot a little more today and when she changed the bandage a few of the Steri-Strips were lifting so she ?reapplied them and put Betadine but is unsure if she should do something else. Advise wife to send me a picture and she did in the pict ure a few Steri-Strips appear to be lifting up with mild maceration and minimal localized erythema advised the wife to remove the loose Steri-Strips to apply Betadine and dry dressing and to monitor the area of erythema if erythema worsens to call back and I also advised her to change the dressing again tomorrow morning and to send me an updated picture if the picture appears to be progressing and getting worse, Patient will need to go  to the hospital. Meanwhile continue with rest elevation limited activity and taking Keflex as directed. I also advised wife to monitor temperature.  Dr. Marylene Land

## 2021-02-15 ENCOUNTER — Ambulatory Visit (INDEPENDENT_AMBULATORY_CARE_PROVIDER_SITE_OTHER): Payer: 59 | Admitting: Podiatry

## 2021-02-15 ENCOUNTER — Encounter: Payer: Self-pay | Admitting: Podiatry

## 2021-02-15 ENCOUNTER — Other Ambulatory Visit: Payer: Self-pay

## 2021-02-15 ENCOUNTER — Other Ambulatory Visit (HOSPITAL_COMMUNITY): Payer: Self-pay

## 2021-02-15 VITALS — Temp 97.7°F

## 2021-02-15 DIAGNOSIS — L97512 Non-pressure chronic ulcer of other part of right foot with fat layer exposed: Secondary | ICD-10-CM

## 2021-02-15 DIAGNOSIS — E08621 Diabetes mellitus due to underlying condition with foot ulcer: Secondary | ICD-10-CM

## 2021-02-15 DIAGNOSIS — Z9889 Other specified postprocedural states: Secondary | ICD-10-CM

## 2021-02-15 MED ORDER — MELOXICAM 15 MG PO TABS
15.0000 mg | ORAL_TABLET | Freq: Every day | ORAL | 0 refills | Status: DC
  Filled 2021-02-15: qty 30, 30d supply, fill #0

## 2021-02-17 NOTE — Progress Notes (Signed)
Subjective: CAMAR GUYTON is a 55 y.o. is seen today in office s/p right partial first amputation preformed on 01/23/2021.  Presents today for possible suture removal.  He has not had any pain.  He states he feels like he is doing better than it was previously.  Denies any fevers or chills.  No other concerns today.  Objective: General: No acute distress, AAOx3-wife present DP/PT pulses palpable 2/4, CRT < 3 sec to all digits.  Right foot: Incision is well coapted without any evidence of dehiscence.  Scab present along portion of the incision.  Still some residual edema present.  There is some mild erythema noted today as well along the lateral portion.  There is no ascending cellulitis.  There is no purulence.  No fluctuation crepitation.  There is no malodor.   No pain with calf compression, swelling, warmth, erythema.   Assessment and Plan:  Status post right partial fourth toe amputation, doing well with no complications   -Treatment options discussed including all alternatives, risks, and complications -Sutures removed today.  Did clean some of the scab is present along the incision as well.  On the lateral portion there was some slight superficial area of skin breakdown but this is likely from with a scab with.  There is no probing.  There is no drainage or pus.  However there is some mild erythema and some start ampicillin.  He needs to limit his activity and keep the foot elevated.  Recommend dressing changes as well as his wife can help with that she reports. -Monitor for any clinical signs or symptoms of infection and directed to call the office immediately should any occur or go to the ER.   Vivi Barrack DPM

## 2021-02-18 ENCOUNTER — Other Ambulatory Visit (HOSPITAL_COMMUNITY): Payer: Self-pay

## 2021-02-18 ENCOUNTER — Other Ambulatory Visit: Payer: Self-pay | Admitting: Podiatry

## 2021-02-18 LAB — WOUND CULTURE
MICRO NUMBER:: 12360753
SPECIMEN QUALITY:: ADEQUATE

## 2021-02-18 MED ORDER — SULFAMETHOXAZOLE-TRIMETHOPRIM 800-160 MG PO TABS
1.0000 | ORAL_TABLET | Freq: Two times a day (BID) | ORAL | 0 refills | Status: DC
  Filled 2021-02-18: qty 20, 10d supply, fill #0

## 2021-02-19 NOTE — Progress Notes (Signed)
Subjective: Martin [name] is a 55 y.o. is seen today in office s/p right partial first amputation preformed on 01/23/2021.  He presents today as of the weekend his wife notes increased drainage.  His wife states that on Friday he was on his foot more than he should have been.  His wife has been applying Betadine to the area.  He denies any increase in pain.  No fevers or chills.  No other concerns today.  Objective: General: No acute distress, AAOx3-wife present DP/PT pulses palpable 2/4, CRT < 3 sec to all digits.  Right foot: Incision is well coapted without any evidence of dehiscence along the medial aspect.  On the lateral aspect there is a superficial area that is open but there is no probing to monitor tunneling.  There is serosanguineous drainage expressed there is no frank purulence.  There is some localized erythema.  Area without any ascending cellulitis.  No fluctuance or crepitation.  No malodor.  No pain with calf compression, swelling, warmth, erythema.   Assessment and Plan:  Status post right partial fourth toe amputation, increased drainage.  -Treatment options discussed including all alternatives, risks, and complications -Incision was cleaned.  Given the drainage I did not take a wound culture today.  We will start ampicillin based on previous culture and await the new wound culture.  Continue daily dressing changes daily.  Continue with offloading shoe, elevation and encouraged to limit activity.  Glucose control as well. -Monitor for any clinical signs or symptoms of infection and directed to call the office immediately should any occur or go to the ER.   Vivi Barrack DPM

## 2021-02-22 ENCOUNTER — Ambulatory Visit (INDEPENDENT_AMBULATORY_CARE_PROVIDER_SITE_OTHER): Payer: 59 | Admitting: Podiatry

## 2021-02-22 ENCOUNTER — Encounter: Payer: Self-pay | Admitting: Podiatry

## 2021-02-22 ENCOUNTER — Other Ambulatory Visit (HOSPITAL_COMMUNITY): Payer: Self-pay

## 2021-02-22 ENCOUNTER — Other Ambulatory Visit: Payer: Self-pay

## 2021-02-22 DIAGNOSIS — L97512 Non-pressure chronic ulcer of other part of right foot with fat layer exposed: Secondary | ICD-10-CM

## 2021-02-22 DIAGNOSIS — E08621 Diabetes mellitus due to underlying condition with foot ulcer: Secondary | ICD-10-CM

## 2021-02-22 DIAGNOSIS — Z9889 Other specified postprocedural states: Secondary | ICD-10-CM

## 2021-02-22 MED ORDER — SULFAMETHOXAZOLE-TRIMETHOPRIM 800-160 MG PO TABS
1.0000 | ORAL_TABLET | Freq: Two times a day (BID) | ORAL | 0 refills | Status: DC
  Filled 2021-02-22 – 2021-02-24 (×2): qty 20, 10d supply, fill #0

## 2021-02-22 NOTE — Progress Notes (Signed)
Subjective: Martin [name] is a 55 y.o. is seen today in office s/p right partial first amputation preformed on 01/23/2021.  He presents today with his wife.  The redness that he was experiencing has improved.  His wife does not change the bandage.  Minimal drainage but appears to be improved.  He is still on antibiotics.  He has no fevers or chills and he has no pain.  He feels fine.  No other concerns.  Objective: General: No acute distress, AAOx3-wife present DP/PT pulses palpable 2/4, CRT < 3 sec to all digits.  Right foot: Incision is well coapted without any evidence of dehiscence along the medial aspect.  On the lateral aspect is a superficial area of skin breakdown but appears to be improved.  There is no purulence identified.  No probing, undermining or tunneling.  Mild surrounding erythema without any ascending cellulitis.  Erythema this present previously has improved compared to last week.  There is no malodor. No pain with calf compression, swelling, warmth, erythema.   Assessment and Plan:  Status post right partial fourth toe amputation, improving   -Treatment options discussed including all alternatives, risks, and complications -I did debride some of the loose skin and there was a small mount of scab on the incision site debrided today.  The wound itself appears to be improving.  Continue with dressing changes with Maxorb for now. -Continue antibiotics -I discussed with him encouraged him he needs to stay off his foot is much as possible and elevate the foot. -Monitor for any clinical signs or symptoms of infection and directed to call the office immediately should any occur or go to the ER.  Martin [name] DPM

## 2021-02-23 ENCOUNTER — Other Ambulatory Visit (HOSPITAL_COMMUNITY): Payer: Self-pay

## 2021-02-24 ENCOUNTER — Other Ambulatory Visit (HOSPITAL_COMMUNITY): Payer: Self-pay

## 2021-02-24 ENCOUNTER — Encounter: Payer: Self-pay | Admitting: Podiatry

## 2021-02-25 ENCOUNTER — Other Ambulatory Visit (HOSPITAL_COMMUNITY): Payer: Self-pay

## 2021-02-25 DIAGNOSIS — Z89429 Acquired absence of other toe(s), unspecified side: Secondary | ICD-10-CM | POA: Diagnosis not present

## 2021-02-25 DIAGNOSIS — E1165 Type 2 diabetes mellitus with hyperglycemia: Secondary | ICD-10-CM | POA: Diagnosis not present

## 2021-02-25 DIAGNOSIS — Z720 Tobacco use: Secondary | ICD-10-CM | POA: Diagnosis not present

## 2021-02-25 DIAGNOSIS — Z794 Long term (current) use of insulin: Secondary | ICD-10-CM | POA: Diagnosis not present

## 2021-02-26 ENCOUNTER — Other Ambulatory Visit (HOSPITAL_COMMUNITY): Payer: Self-pay

## 2021-03-01 ENCOUNTER — Other Ambulatory Visit: Payer: Self-pay

## 2021-03-01 ENCOUNTER — Ambulatory Visit (INDEPENDENT_AMBULATORY_CARE_PROVIDER_SITE_OTHER): Payer: 59

## 2021-03-01 ENCOUNTER — Ambulatory Visit (INDEPENDENT_AMBULATORY_CARE_PROVIDER_SITE_OTHER): Payer: 59 | Admitting: Podiatry

## 2021-03-01 DIAGNOSIS — Z9889 Other specified postprocedural states: Secondary | ICD-10-CM

## 2021-03-01 DIAGNOSIS — E08621 Diabetes mellitus due to underlying condition with foot ulcer: Secondary | ICD-10-CM

## 2021-03-01 DIAGNOSIS — L97512 Non-pressure chronic ulcer of other part of right foot with fat layer exposed: Secondary | ICD-10-CM

## 2021-03-01 DIAGNOSIS — M86171 Other acute osteomyelitis, right ankle and foot: Secondary | ICD-10-CM

## 2021-03-05 NOTE — Progress Notes (Signed)
Subjective: Martin [name] is a 55 y.o. is seen today in office s/p right partial first amputation preformed on 01/23/2021.  Since I last saw him I talked to his wife at work on Friday and started using Aquacel as it appeared that the wound opening some.  He states that since using is that wound has been healing has not been significant drainage.  No swelling or redness.  No fevers or chills.  He is still wearing the Darco shoe.  He is eager to be back in a  regular shoe.  Objective: General: No acute distress, AAOx3-wife present DP/PT pulses palpable 2/4, CRT < 3 sec to all digits.  Right foot: Incision is well coapted without any evidence of dehiscence along the medial aspect.  Lateral aspect there is a superficial area of skin breakdown with granular wound without any probing, undermining or tunneling.  It appears to be more narrow and almost healed.  There is no surrounding erythema, ascending cellulitis.  No fluctuance or crepitation.  No malodor.  No obvious signs of infection noted. No pain with calf compression, swelling, warmth, erythema.   Assessment and Plan:  Status post right partial first ray amputation, improving   -Treatment options discussed including all alternatives, risks, and complications -X-rays obtained and reviewed.  Status post partial first ray potation.  No evidence of acute fracture, osteomyelitis. -Debrided the wound today down to healthy, granular tissue.  Continue with Aquacel dressing changes.  Continue surgical shoe, elevation I stressed the importance of trying stay off the foot is much as possible.  Hopefully at the next appointment the wound is healed we can get measured for the insert, toe filler.  Vivi Barrack DPM

## 2021-03-09 ENCOUNTER — Other Ambulatory Visit (HOSPITAL_COMMUNITY): Payer: Self-pay

## 2021-03-09 MED ORDER — FREESTYLE LANCETS MISC
3 refills | Status: AC
  Filled 2021-03-09: qty 300, 90d supply, fill #0

## 2021-03-09 MED ORDER — FREESTYLE LITE TEST VI STRP
ORAL_STRIP | 3 refills | Status: AC
  Filled 2021-03-09: qty 250, 83d supply, fill #0

## 2021-03-09 MED FILL — Insulin Pen Needle 31 G X 6 MM (1/4" or 15/64"): 50 days supply | Qty: 100 | Fill #0 | Status: AC

## 2021-03-09 MED FILL — Insulin Degludec Soln Pen-Injector 200 Unit/ML: SUBCUTANEOUS | 30 days supply | Qty: 24 | Fill #2 | Status: AC

## 2021-03-10 ENCOUNTER — Other Ambulatory Visit (HOSPITAL_COMMUNITY): Payer: Self-pay

## 2021-03-11 ENCOUNTER — Ambulatory Visit (INDEPENDENT_AMBULATORY_CARE_PROVIDER_SITE_OTHER): Payer: 59 | Admitting: Podiatry

## 2021-03-11 ENCOUNTER — Other Ambulatory Visit: Payer: Self-pay

## 2021-03-11 DIAGNOSIS — M86171 Other acute osteomyelitis, right ankle and foot: Secondary | ICD-10-CM

## 2021-03-11 DIAGNOSIS — Z9889 Other specified postprocedural states: Secondary | ICD-10-CM

## 2021-03-12 ENCOUNTER — Other Ambulatory Visit (HOSPITAL_COMMUNITY): Payer: Self-pay

## 2021-03-12 ENCOUNTER — Other Ambulatory Visit: Payer: Self-pay | Admitting: Podiatry

## 2021-03-12 MED ORDER — SULFAMETHOXAZOLE-TRIMETHOPRIM 800-160 MG PO TABS: 1.0000 | ORAL_TABLET | Freq: Two times a day (BID) | ORAL | 0 refills | Status: DC

## 2021-03-12 NOTE — Progress Notes (Signed)
Received call from on-call answering service. Patient stated he was seen yesterday by Dr. Ardelle Anton. Has concerns his 2nd toe blistered up and is infected. Did not get the antibiotic that was to be called in.  It appears per patient Dr. Ardelle Anton was to refill his last abx. I refilled this for him. I advised to call back if any worsening noted or if he had other concerns.

## 2021-03-16 ENCOUNTER — Other Ambulatory Visit (HOSPITAL_COMMUNITY): Payer: Self-pay

## 2021-03-16 DIAGNOSIS — Z23 Encounter for immunization: Secondary | ICD-10-CM | POA: Insufficient documentation

## 2021-03-16 DIAGNOSIS — Z0000 Encounter for general adult medical examination without abnormal findings: Secondary | ICD-10-CM | POA: Insufficient documentation

## 2021-03-16 MED ORDER — SILDENAFIL CITRATE 100 MG PO TABS
ORAL_TABLET | ORAL | 5 refills | Status: DC
  Filled 2021-03-16: qty 6, 30d supply, fill #0
  Filled 2021-04-15 – 2021-06-01 (×2): qty 6, 30d supply, fill #1
  Filled 2021-07-02: qty 6, 30d supply, fill #2
  Filled 2021-08-13: qty 6, 30d supply, fill #3
  Filled 2021-09-03: qty 6, 30d supply, fill #4
  Filled 2021-10-08: qty 6, 30d supply, fill #5
  Filled 2021-11-08: qty 6, 30d supply, fill #6
  Filled 2022-02-16: qty 6, 30d supply, fill #7
  Filled 2022-03-08: qty 6, 30d supply, fill #8

## 2021-03-17 ENCOUNTER — Ambulatory Visit (INDEPENDENT_AMBULATORY_CARE_PROVIDER_SITE_OTHER): Payer: 59 | Admitting: *Deleted

## 2021-03-17 ENCOUNTER — Other Ambulatory Visit: Payer: Self-pay

## 2021-03-17 DIAGNOSIS — Z89411 Acquired absence of right great toe: Secondary | ICD-10-CM

## 2021-03-17 NOTE — Progress Notes (Signed)
Subjective: Martin [name] is a 55 y.o. is seen today in office s/p right partial first amputation preformed on 01/23/2021.  He presents today for follow-up evaluation of the wound.  They ran out of  Aquacel.  He has a new spot present on the second toe.  No drainage.  Not sure how this started.  No fevers or chills that he reports.  No other concerns.  Objective: General: No acute distress, AAOx3-wife present DP/PT pulses palpable 2/4, CRT < 3 sec to all digits.  Right foot: Incision is well coapted without any evidence of dehiscence along the medial aspect.  On the lateral aspect there is a superficial opening with granular wound base is a vertical linear type wound.  There is no probing, amount or tunneling.  There is no surrounding erythema, ascending cellulitis.  Along the inferior portion of swelling a scab is present.  No fluctuation crepitation.  No malodor.  On the second digit IPJ superficial skin breakdown, abrasion type lesion.  Faint rim of erythema without any ascending cellulitis.  No drainage or pus.  No pain with calf compression, swelling, warmth, erythema.   Assessment and Plan:  Status post right partial first ray amputation, improving; new superficial lesion 2nd toe  -Treatment options discussed including all alternatives, risks, and complications -Debrided the wound today down to healthy, granular tissue and he tolerated the procedure well with minimal blood loss.  Hemostasis to the medial compression.  We can switch to using Prisma on the wound.  He can use a small amount of antibiotic ointment on the second toe.  Continue surgical shoe, elevation I stressed the importance of trying stay off the foot is much as possible.  Hopefully at the next appointment the wound is healed we can get measured for the insert, toe filler. -Bactrim given the new lesion on the second toe. -We will extend his FMLA and I sent a message over to [facility] and she is going to take care of this.     Vivi Barrack DPM

## 2021-03-17 NOTE — Progress Notes (Signed)
Patient presents today to be casted for custom molded orthotics. Dr. Ardelle Anton has been treating patient for status post right great toe amputation.   Patient had questions regarding diabetic shoes, insoles, and insurance coverage. I spoke with Dawn and she will contact his insurance company to find out benefit coverage on diabetic shoes and insoles.   Dr. Ardelle Anton would like patient to get an insole with a toe filler for the right foot.   Initially, I was going to get him molded for custom orthotics and have them add the toe filler to the right, but now given his questioning of the diabetic shoes and the fact he is still wrapped in bandages, I held off on doing this today.  Patient will follow up with Dr. Ardelle Anton on 03/19/21 to evaluate his progress from the amputation and see if he is able to go without bandaging at that time. In the meantime, Alvis Lemmings will work on Scientist, water quality benefits. Hopefully he can be measured and molded Friday at his follow up appointment with Dr. Ardelle Anton.

## 2021-03-18 DIAGNOSIS — Z23 Encounter for immunization: Secondary | ICD-10-CM | POA: Diagnosis not present

## 2021-03-19 ENCOUNTER — Ambulatory Visit (INDEPENDENT_AMBULATORY_CARE_PROVIDER_SITE_OTHER): Payer: 59 | Admitting: Podiatry

## 2021-03-19 ENCOUNTER — Other Ambulatory Visit: Payer: Self-pay

## 2021-03-19 DIAGNOSIS — Z89411 Acquired absence of right great toe: Secondary | ICD-10-CM

## 2021-03-19 DIAGNOSIS — M86171 Other acute osteomyelitis, right ankle and foot: Secondary | ICD-10-CM

## 2021-03-23 ENCOUNTER — Telehealth: Payer: Self-pay | Admitting: Podiatry

## 2021-03-23 NOTE — Telephone Encounter (Signed)
Per Lynnea Ferrier # [employer] insurance the [id number] and [id number] are all valid and billable codes. Auth needed if over 1500.00. covered @ 80% after deductible.. ref # [id number]

## 2021-03-23 NOTE — Progress Notes (Signed)
Subjective: Martin [name] is a 55 y.o. is seen today in office s/p right partial first amputation preformed on 01/23/2021.  His wife has been changing the drainage with Aquacel.  He is finished antibiotics.  He states he is eager and ready to get back into regular shoe.  Currently denies any fevers or chills.  No nausea or vomiting or any other concerns.   Objective: General: No acute distress, AAOx3-wife present DP/PT pulses palpable 2/4, CRT < 3 sec to all digits.  Right foot: Incision is well coapted without any evidence of dehiscence along the medial aspect.  On the lateral aspect there is a superficial granular wound and still linear type wound on the skin fissure.  There is no probing, undermining or tunneling.  There is no drainage or pus.  Area in the second toe has scabbed over.  There is faint rim of erythema without any ascending cellulitis.  There is no drainage or pus or any signs of infection to this area. No pain with calf compression, swelling, warmth, erythema.   Assessment and Plan:  Status post right partial first ray amputation, improving; new superficial lesion 2nd toe  -Treatment options discussed including all alternatives, risks, and complications -Sharply debrided the hyperkeratotic tissue, ulceration on the amputation site without any complications and healthy, minimal tissue.  Continue with Aquacel dressing changes.  I would continue a small amount of antibiotic ointment on the second toe.  The toe is doing better.  I discussed with him he can gradually transition back into regular shoe as tolerated as he is doing better but he needs to keep a very close eye on his foot and there is any changes or worsening to return in surgical shoe immediately let us know. -He was measured for inserts today. -He needs to return to work on November 9. -Monitor for any clinical signs or symptoms of infection and directed to call the office immediately should any occur or go to the  ER.  Return in about 10 days (around 03/29/2021) for wound check .  Vivi Barrack DPM

## 2021-03-29 ENCOUNTER — Other Ambulatory Visit: Payer: Self-pay

## 2021-03-29 ENCOUNTER — Ambulatory Visit (INDEPENDENT_AMBULATORY_CARE_PROVIDER_SITE_OTHER): Payer: 59 | Admitting: Podiatry

## 2021-03-29 ENCOUNTER — Encounter: Payer: Self-pay | Admitting: Podiatry

## 2021-03-29 DIAGNOSIS — M86171 Other acute osteomyelitis, right ankle and foot: Secondary | ICD-10-CM

## 2021-03-29 DIAGNOSIS — Z9889 Other specified postprocedural states: Secondary | ICD-10-CM

## 2021-03-29 DIAGNOSIS — Z89411 Acquired absence of right great toe: Secondary | ICD-10-CM

## 2021-04-05 ENCOUNTER — Other Ambulatory Visit: Payer: Self-pay | Admitting: Podiatry

## 2021-04-05 ENCOUNTER — Telehealth: Payer: Self-pay | Admitting: Podiatry

## 2021-04-05 MED ORDER — MUPIROCIN 2 % EX OINT: 1.0000 "application " | TOPICAL_OINTMENT | Freq: Two times a day (BID) | CUTANEOUS | 2 refills | Status: DC

## 2021-04-05 MED ORDER — AMOXICILLIN-POT CLAVULANATE 875-125 MG PO TABS: 1.0000 | ORAL_TABLET | Freq: Two times a day (BID) | ORAL | 0 refills | Status: DC

## 2021-04-05 NOTE — Progress Notes (Signed)
Subjective: Martin [name] is a 55 y.o. is seen today in office s/p right partial first amputation preformed on 01/23/2021.  He has been back to wearing regular shoe full-time.  Still wearing his inserts.  His wife is continue daily dressing changes with Aquacel and appears the wound is doing better.  No increase in swelling or redness.  No drainage or pus.  Denies any fevers or chills.  Objective: General: No acute distress, AAOx3-wife present DP/PT pulses palpable 2/4, CRT < 3 sec to all digits.  Right foot: Incision is well coapted without any evidence of dehiscence along the medial aspect.  This tear appears to be improved almost healed.  A superficial skin fissure type lesion is present.  There is no probing, amount or tunneling.  No surrounding erythema, ascending cellulitis.  No fluctuation or crepitation but there is no malodor.  Preulcerative area on the right second toe without any skin breakdown today. Hammertoes present. No pain with calf compression, swelling, warmth, erythema.   Assessment and Plan:  Status post right partial first ray amputation, improving; preulcerative lesion right second toe.   -Treatment options discussed including all alternatives, risks, and complications -Sharply debrided the hyperkeratotic tissue, ulceration on the amputation site without any complications and healthy, minimal tissue.  Recommend continue with Aquacel dressing changes daily.  Appears to be doing much better and almost healed.  I will also continue offloading on the second toe.  If it would open up and use Aquacel on this area as well.  Awaiting his inserts. -Monitor for any clinical signs or symptoms of infection and directed to call the office immediately should any occur or go to the ER.  No follow-ups on file.  Vivi Barrack DPM

## 2021-04-05 NOTE — Telephone Encounter (Signed)
Patient called stating that the amputation site has healed but his dog has bit his toe and that the toenail on the 2nd toe has come off and the toe is stating to get pink and somewhat red. No purulence. No fevers, chills. He is scheduled to come in on Thursday but I will see him tomorrow at 4:45  Marchelle Folks- can you schedule him for 04/06/2021 at 4:45

## 2021-04-06 ENCOUNTER — Ambulatory Visit (INDEPENDENT_AMBULATORY_CARE_PROVIDER_SITE_OTHER): Payer: 59

## 2021-04-06 ENCOUNTER — Encounter: Payer: Self-pay | Admitting: Podiatry

## 2021-04-06 ENCOUNTER — Ambulatory Visit (INDEPENDENT_AMBULATORY_CARE_PROVIDER_SITE_OTHER): Payer: 59 | Admitting: Podiatry

## 2021-04-06 ENCOUNTER — Other Ambulatory Visit: Payer: Self-pay

## 2021-04-06 DIAGNOSIS — E08621 Diabetes mellitus due to underlying condition with foot ulcer: Secondary | ICD-10-CM | POA: Diagnosis not present

## 2021-04-06 DIAGNOSIS — W540XXA Bitten by dog, initial encounter: Secondary | ICD-10-CM | POA: Diagnosis not present

## 2021-04-06 DIAGNOSIS — L97512 Non-pressure chronic ulcer of other part of right foot with fat layer exposed: Secondary | ICD-10-CM

## 2021-04-06 DIAGNOSIS — M2041 Other hammer toe(s) (acquired), right foot: Secondary | ICD-10-CM | POA: Diagnosis not present

## 2021-04-08 ENCOUNTER — Encounter: Payer: 59 | Admitting: Podiatry

## 2021-04-08 ENCOUNTER — Other Ambulatory Visit (HOSPITAL_COMMUNITY): Payer: Self-pay

## 2021-04-08 MED FILL — Insulin Degludec Soln Pen-Injector 200 Unit/ML: SUBCUTANEOUS | 38 days supply | Qty: 24 | Fill #3 | Status: AC

## 2021-04-09 ENCOUNTER — Encounter: Payer: Self-pay | Admitting: Podiatry

## 2021-04-09 ENCOUNTER — Ambulatory Visit (INDEPENDENT_AMBULATORY_CARE_PROVIDER_SITE_OTHER): Payer: 59 | Admitting: Podiatry

## 2021-04-09 ENCOUNTER — Other Ambulatory Visit: Payer: Self-pay

## 2021-04-09 DIAGNOSIS — W540XXD Bitten by dog, subsequent encounter: Secondary | ICD-10-CM

## 2021-04-09 DIAGNOSIS — M2041 Other hammer toe(s) (acquired), right foot: Secondary | ICD-10-CM

## 2021-04-09 DIAGNOSIS — E08621 Diabetes mellitus due to underlying condition with foot ulcer: Secondary | ICD-10-CM

## 2021-04-09 DIAGNOSIS — L97512 Non-pressure chronic ulcer of other part of right foot with fat layer exposed: Secondary | ICD-10-CM

## 2021-04-11 MED ORDER — AMOXICILLIN-POT CLAVULANATE 875-125 MG PO TABS: 1.0000 | ORAL_TABLET | Freq: Two times a day (BID) | ORAL | 0 refills | Status: DC

## 2021-04-11 NOTE — Progress Notes (Signed)
Subjective: Martin [name] is a 55 y.o. is seen today in office s/p right partial first amputation preformed on 01/23/2021.  He presents today for new issue.  He states that his dog bit his second toe at the nails, and concern for infection.  He did call and started on Augmentin yesterday.  He presents today for evaluation.  Currently denies any fevers or chills.  Objective: General: No acute distress, AAOx3-wife present DP/PT pulses palpable 2/4, CRT < 3 sec to all digits.  Right foot: On the amputation site there, hyperkeratotic tissue buildup.  It was also completely healed.  No probing, undermining or tunneling.  No edema, erythema, drainage or pus or signs of infection.  His main concern today is the second toe.  The nails off of the toe and there is a fibrotic wound present distal aspect of the toe with a small amount of granulation tissue.  Serosanguineous drainage expressed but is no frank purulence.  There is edema and erythema to the distal portion of toe.  There is no fluctuation or crepitation.  There is no malodor. Hammertoes present. No pain with calf compression, swelling, warmth, erythema.   Assessment and Plan:  Status post right partial first ray amputation, improving; right second toe infection  -Treatment options discussed including all alternatives, risks, and complications -X-rays obtained reviewed.  No definite evidence of acute osteomyelitis identified at this time.  Status post partial first ray amputation. -Discussed very concerned about the second toe due to the appearance.  Does not probe to bone today but given the infection he is at high risk of toe loss.  Also given the rigid hammertoe contracture we discussed with him.  We Will Try to the Pressure off the Toe and Think at This Point in Order to Help Try to Save the Toe This Could Be Beneficial.  After Discussion Regards to Risks He Wishes to Proceed.  I discussed with right second toe flexor tenotomy.  Consent was  signed.  Skin was prepped with alcohol and 3 cc of lidocaine, Marcaine plain was infiltrated in a digital block fashion.  Once anesthetized the was prepped with Betadine.  At this time an 18-gauge needle was utilized to inject the tendon which was introduced to the plantar aspect of the toe at the IPJ.  I was able to transect the tendon total sitting in a more rectus position.  This was irrigated with alcohol and a bandage was applied.  He tolerated procedure well and complications. -Continue Augmentin.  Continue surgical shoe, elevation to stay off the foot is much as possible this week. -Monitor for any clinical signs or symptoms of infection and directed to call the office immediately should any occur or go to the ER.  He is in a follow-up on Friday.  Vivi Barrack DPM

## 2021-04-12 NOTE — Progress Notes (Signed)
Subjective: Martin [name] is a 55 y.o. is seen today in office s/p right partial first amputation preformed on 01/23/2021.  He has a recent underwent right second toe flexor tenotomy for later in the week.  He presents today for follow-up evaluation.  He still taking the Augmentin.  He has not seen any drainage or pus or any worsening swelling or redness no red streaks.  Denies fevers or chills.  No other concerns today.  He is scheduled to return to work on Sunday as he states that he has to keep his job.   Objective: General: No acute distress, AAOx3-wife present DP/PT pulses palpable 2/4, CRT < 3 sec to all digits.  Right foot: On the previous hallux amputation.  The wound is healed.  Minimal hyperkeratotic tissue but upon debridement the wound appears to be closed.  There is still ulceration present at the distal aspect of second toe with some hyperkeratotic tissue which I debrided today and there is still Fibrogammin wound base.  There is no probing to bone, no tunneling.  There is still some localized edema and erythema to the distal portion of toe but appears to be somewhat improved compared to when I saw him earlier in the week.  No fluctuation crepitation but there is no malodor.  No ascending cellulitis. Hammertoes present. No pain with calf compression, swelling, warmth, erythema.   Assessment and Plan:  Status post right partial first ray amputation, improving; right second toe infection  -Treatment options discussed including all alternatives, risks, and complications -I sharply debrided the wound to the distal placenta with any complications or bleeding.  For now continue with adequate dressing changes daily and offloading.  Unfortunately he has returned to work on Sunday night or he is in a lose his job.  Ultimately like him to stay to work into the wound heals as he has a high risk of amputation of the toe which he understands.  Discussed need to keep a very close eye on this toe.   Continue Augmentin.  Discussed changing the bandage during his shifts as well.  Is any worsening needs to let me know immediately.  Vivi Barrack DPM

## 2021-04-14 ENCOUNTER — Ambulatory Visit: Payer: 59 | Admitting: Dietician

## 2021-04-14 ENCOUNTER — Other Ambulatory Visit (HOSPITAL_COMMUNITY): Payer: Self-pay

## 2021-04-15 ENCOUNTER — Ambulatory Visit (INDEPENDENT_AMBULATORY_CARE_PROVIDER_SITE_OTHER): Payer: 59 | Admitting: Podiatry

## 2021-04-15 ENCOUNTER — Ambulatory Visit (INDEPENDENT_AMBULATORY_CARE_PROVIDER_SITE_OTHER): Payer: 59

## 2021-04-15 ENCOUNTER — Other Ambulatory Visit: Payer: Self-pay

## 2021-04-15 ENCOUNTER — Other Ambulatory Visit (HOSPITAL_COMMUNITY): Payer: Self-pay

## 2021-04-15 DIAGNOSIS — E08621 Diabetes mellitus due to underlying condition with foot ulcer: Secondary | ICD-10-CM

## 2021-04-15 DIAGNOSIS — L97512 Non-pressure chronic ulcer of other part of right foot with fat layer exposed: Secondary | ICD-10-CM | POA: Diagnosis not present

## 2021-04-15 DIAGNOSIS — L02611 Cutaneous abscess of right foot: Secondary | ICD-10-CM

## 2021-04-15 DIAGNOSIS — L03031 Cellulitis of right toe: Secondary | ICD-10-CM

## 2021-04-15 DIAGNOSIS — M2041 Other hammer toe(s) (acquired), right foot: Secondary | ICD-10-CM | POA: Diagnosis not present

## 2021-04-15 MED ORDER — SULFAMETHOXAZOLE-TRIMETHOPRIM 800-160 MG PO TABS
1.0000 | ORAL_TABLET | Freq: Two times a day (BID) | ORAL | 0 refills | Status: DC
  Filled 2021-04-15: qty 20, 10d supply, fill #0

## 2021-04-15 MED FILL — Lisinopril Tab 10 MG: ORAL | 90 days supply | Qty: 90 | Fill #0 | Status: AC

## 2021-04-18 NOTE — Progress Notes (Addendum)
Subjective: Martin [name] is a 55 y.o. is seen today in office s/p right partial first amputation preformed on 01/23/2021. He presents today for follow up evaluation of the right 2nd toe ulcer. He had to return to work and he has been on his feet a lot more recently. He does change the bandage daily and they have been applying iodosorb to the ulcer. He has not seen any increase in drainage. No purulence.  Denies fevers or chills.  No other concerns today.    Objective: General: No acute distress, AAOx3-wife present DP/PT pulses palpable 2/4, CRT < 3 sec to all digits.  Right foot: On the previous hallux amputation.  The wound is healed.  Amputation site has healed. There is still a fibrogranular ulceration noted at the distal portion of the right 2nd toe. There is no probing to bone. There is no exposed bone or tendon. There is still localized edema/erythema to the distal portion of the toe. There is no ascending cellulitis. No fluctuation or crepitation. No malodor.  Hammertoes present. No pain with calf compression, swelling, warmth, erythema.   Assessment and Plan:  Status post right partial first ray amputation, improving; right second toe infection  -Treatment options discussed including all alternatives, risks, and complications -X-rays were obtained and reviewed with the patient. No definitive evidence of osteomyelitis noted.  -I am still very concerned about the 2nd toe and risk of amputation, which he is well aware. He has to return to work and cannot take anymore time off right now without loosing his job he states.  -I sharply debrided the wound on the distal portion of the 2nd toe with any complications using a #132 blade scalpel and tissue nipper to healthy tissue. The wound does not probe to bone, but it is close. Encouraged daily dressing changes, and changing frequently. Will do aquacel to the wound when working but then at night he can remove the bandage and do a small amount of  antibiotic ointment. Discussed regular changing of the bandage will working if there is any moisture or other issues. I would like to keep him on antibiotic due to infection. Will switch to bactrim to see if he will respond better to this. Monitor for any clinical signs or symptoms of infection and directed to call the office immediately should any occur or go to the ER.  *Order blood work next appointment, CBC, BMP, ESR, CRP Return in about 1 week (around 04/22/2021) for ulcer.  Trula Slade DPM

## 2021-04-19 ENCOUNTER — Encounter: Payer: 59 | Admitting: Podiatry

## 2021-04-21 ENCOUNTER — Other Ambulatory Visit (HOSPITAL_COMMUNITY): Payer: Self-pay

## 2021-04-21 MED FILL — Metformin HCl Tab ER 24HR 500 MG: ORAL | 90 days supply | Qty: 360 | Fill #1 | Status: AC

## 2021-04-22 ENCOUNTER — Other Ambulatory Visit (HOSPITAL_COMMUNITY): Payer: Self-pay

## 2021-04-22 ENCOUNTER — Ambulatory Visit (INDEPENDENT_AMBULATORY_CARE_PROVIDER_SITE_OTHER): Payer: 59 | Admitting: Podiatry

## 2021-04-22 ENCOUNTER — Other Ambulatory Visit: Payer: Self-pay | Admitting: Podiatry

## 2021-04-22 ENCOUNTER — Other Ambulatory Visit: Payer: Self-pay

## 2021-04-22 DIAGNOSIS — M86171 Other acute osteomyelitis, right ankle and foot: Secondary | ICD-10-CM

## 2021-04-22 DIAGNOSIS — L02611 Cutaneous abscess of right foot: Secondary | ICD-10-CM | POA: Diagnosis not present

## 2021-04-22 DIAGNOSIS — L03031 Cellulitis of right toe: Secondary | ICD-10-CM

## 2021-04-22 MED ORDER — SULFAMETHOXAZOLE-TRIMETHOPRIM 800-160 MG PO TABS
1.0000 | ORAL_TABLET | Freq: Two times a day (BID) | ORAL | 0 refills | Status: DC
  Filled 2021-04-22: qty 20, 10d supply, fill #0

## 2021-04-23 ENCOUNTER — Other Ambulatory Visit (HOSPITAL_COMMUNITY): Payer: Self-pay

## 2021-04-23 LAB — BASIC METABOLIC PANEL
BUN/Creatinine Ratio: 16 (ref 9–20)
BUN: 16 mg/dL (ref 6–24)
CO2: 22 mmol/L (ref 20–29)
Calcium: 9.5 mg/dL (ref 8.7–10.2)
Chloride: 99 mmol/L (ref 96–106)
Creatinine, Ser: 1.01 mg/dL (ref 0.76–1.27)
Glucose: 282 mg/dL — ABNORMAL HIGH (ref 70–99)
Potassium: 4.7 mmol/L (ref 3.5–5.2)
Sodium: 135 mmol/L (ref 134–144)
eGFR: 87 mL/min/{1.73_m2} (ref >59)

## 2021-04-23 LAB — CBC WITH DIFFERENTIAL/PLATELET
Basophils Absolute: 0.1 10*3/uL (ref 0.0–0.2)
Basos: 1 %
EOS (ABSOLUTE): 0.4 10*3/uL (ref 0.0–0.4)
Eos: 4 %
Hematocrit: 45.5 % (ref 37.5–51.0)
Hemoglobin: 14.7 g/dL (ref 13.0–17.7)
Immature Grans (Abs): 0 10*3/uL (ref 0.0–0.1)
Immature Granulocytes: 0 %
Lymphocytes Absolute: 2.7 10*3/uL (ref 0.7–3.1)
Lymphs: 27 %
MCH: 27 pg (ref 26.6–33.0)
MCHC: 32.3 g/dL (ref 31.5–35.7)
MCV: 84 fL (ref 79–97)
Monocytes Absolute: 1.1 10*3/uL — ABNORMAL HIGH (ref 0.1–0.9)
Monocytes: 11 %
Neutrophils Absolute: 5.7 10*3/uL (ref 1.4–7.0)
Neutrophils: 57 %
Platelets: 334 10*3/uL (ref 150–450)
RBC: 5.45 x10E6/uL (ref 4.14–5.80)
RDW: 13.1 % (ref 11.6–15.4)
WBC: 9.9 10*3/uL (ref 3.4–10.8)

## 2021-04-23 LAB — SEDIMENTATION RATE: Sed Rate: 39 mm/hr — ABNORMAL HIGH (ref 0–30)

## 2021-04-23 LAB — C-REACTIVE PROTEIN: CRP: 4 mg/L (ref 0–10)

## 2021-04-25 NOTE — Progress Notes (Signed)
Subjective: Martin [name] is a 55 y.o. is seen today in office today for follow-up evaluation for wound on the right second toe.  He has had a return to work and the bandage is causing discomfort so he keeps a Band-Aid on the toe and is wearing his regular shoe as he not able to wear the offloading shoe at work.  No significant change the reports.  No worsening swelling or redness or worsening drainage.  Denies any fevers or chills.  No other concerns.   Objective: General: No acute distress, AAOx3-wife present DP/PT pulses palpable 2/4, CRT < 3 sec to all digits.  Right foot: On the previous hallux amputation.  Incision site is healed.  Wound still present distal aspect second toe and there is localized edema and erythema to the distal portion of toe.  Fibrogammin wound base is present.  Hyperkeratotic tissue which I debrided today down to healthy, bleeding tissue.  There is no exposed bone or tendon although it is close to the bone.  No fluctuation or crepitation.  No malodor.  Hammertoes present. No pain with calf compression, swelling, warmth, erythema.   Assessment and Plan:  Status post right partial first ray amputation, improving; right second toe infection  -Treatment options discussed including all alternatives, risks, and complications -Still very concerned about the right second toe.  Did debride some of the loose hyperkeratotic tissue today as well as debridement of the healthy, bleeding tissue.  Iodosorb was applied today after cleansing the wound.  The bandage that we have been applying he states causes discomfort.  I dispensed a foam toe To see if this will help offload.  He took a wound culture today as well as order blood work including CBC, BMP, sed rate, CRP.  There is any worsening signs or symptoms of infection he is to report resting emergency department.  He understands that he is at risk of amputation of the toe.  Unfortunately he is on his feet a lot at work and he cannot  come back out of work.  We did discuss try to get disability.  Vivi Barrack DPM

## 2021-04-26 ENCOUNTER — Encounter: Payer: 59 | Admitting: Podiatry

## 2021-04-27 ENCOUNTER — Other Ambulatory Visit: Payer: Self-pay | Admitting: Podiatry

## 2021-04-27 ENCOUNTER — Telehealth: Payer: Self-pay | Admitting: *Deleted

## 2021-04-27 ENCOUNTER — Other Ambulatory Visit (HOSPITAL_COMMUNITY): Payer: Self-pay

## 2021-04-27 DIAGNOSIS — Z89429 Acquired absence of other toe(s), unspecified side: Secondary | ICD-10-CM | POA: Diagnosis not present

## 2021-04-27 DIAGNOSIS — M86171 Other acute osteomyelitis, right ankle and foot: Secondary | ICD-10-CM

## 2021-04-27 DIAGNOSIS — E1165 Type 2 diabetes mellitus with hyperglycemia: Secondary | ICD-10-CM | POA: Diagnosis not present

## 2021-04-27 DIAGNOSIS — Z794 Long term (current) use of insulin: Secondary | ICD-10-CM | POA: Diagnosis not present

## 2021-04-27 DIAGNOSIS — Z720 Tobacco use: Secondary | ICD-10-CM | POA: Diagnosis not present

## 2021-04-27 MED ORDER — AMPICILLIN 500 MG PO CAPS
500.0000 mg | ORAL_CAPSULE | Freq: Four times a day (QID) | ORAL | 0 refills | Status: DC
  Filled 2021-04-27: qty 40, 10d supply, fill #0

## 2021-04-27 MED ORDER — TRULICITY 1.5 MG/0.5ML ~~LOC~~ SOAJ
1.5000 mg | SUBCUTANEOUS | 4 refills | Status: DC
  Filled 2021-04-27 – 2021-04-28 (×2): qty 6, 84d supply, fill #0

## 2021-04-27 MED ORDER — INSULIN LISPRO (1 UNIT DIAL) 100 UNIT/ML (KWIKPEN)
4.0000 [IU] | PEN_INJECTOR | Freq: Every day | SUBCUTANEOUS | 4 refills | Status: DC
  Filled 2021-04-27: qty 3, 28d supply, fill #0
  Filled 2021-10-11: qty 3, 28d supply, fill #1

## 2021-04-27 NOTE — Telephone Encounter (Signed)
-----   Message from Vivi Barrack, DPM sent at 04/27/2021  1:50 PM EST ----- Misty Stanley- I sent the following message to the patient via mychart if you can please call him. Thanks.   The culture on the toe is growing a different type of bacteria and I would like to switch your antibiotic. I have sent over ampicillin to the pharmacy for you. I am also going to put in a referral for you to see the Infectious Disease specialists. Please let me know if you have any questions or concerns.

## 2021-04-27 NOTE — Telephone Encounter (Signed)
Called and spoke with the patient and patient's wife and relayed the message per Dr Ardelle Anton. Misty Stanley

## 2021-04-27 NOTE — Telephone Encounter (Signed)
Patient's wife is calling because they have received a call from the infectious disease concerning a referral,was unaware. Please call to discuss the lab results and the reason for the referral.

## 2021-04-28 ENCOUNTER — Other Ambulatory Visit (HOSPITAL_COMMUNITY): Payer: Self-pay

## 2021-04-28 NOTE — Telephone Encounter (Signed)
Called patient and spoke with wife,she said that they cannot get on Mychart to view message but his toe is dark, bleeding, no odor. She will try to contact the infectious disease to schedule. Called the [facility] and spoke with [facility], she will contact patient to schedule.

## 2021-04-28 NOTE — Telephone Encounter (Signed)
Called ID and scheduled patient,scheduled patient 05/03/21@10 :30,notified patient and wife, had to move his appointment here to 4:45 on Monday

## 2021-05-03 ENCOUNTER — Encounter: Payer: Self-pay | Admitting: Internal Medicine

## 2021-05-03 ENCOUNTER — Other Ambulatory Visit: Payer: Self-pay

## 2021-05-03 ENCOUNTER — Ambulatory Visit (INDEPENDENT_AMBULATORY_CARE_PROVIDER_SITE_OTHER): Payer: 59 | Admitting: Podiatry

## 2021-05-03 ENCOUNTER — Ambulatory Visit (INDEPENDENT_AMBULATORY_CARE_PROVIDER_SITE_OTHER): Payer: 59 | Admitting: Internal Medicine

## 2021-05-03 ENCOUNTER — Other Ambulatory Visit (HOSPITAL_COMMUNITY): Payer: Self-pay

## 2021-05-03 ENCOUNTER — Encounter: Payer: Self-pay | Admitting: Podiatry

## 2021-05-03 VITALS — BP 162/81 | HR 70 | Temp 98.0°F | Ht 71.0 in | Wt 265.0 lb

## 2021-05-03 DIAGNOSIS — M86171 Other acute osteomyelitis, right ankle and foot: Secondary | ICD-10-CM | POA: Diagnosis not present

## 2021-05-03 DIAGNOSIS — L97512 Non-pressure chronic ulcer of other part of right foot with fat layer exposed: Secondary | ICD-10-CM

## 2021-05-03 DIAGNOSIS — S91109A Unspecified open wound of unspecified toe(s) without damage to nail, initial encounter: Secondary | ICD-10-CM

## 2021-05-03 DIAGNOSIS — B952 Enterococcus as the cause of diseases classified elsewhere: Secondary | ICD-10-CM

## 2021-05-03 DIAGNOSIS — L97509 Non-pressure chronic ulcer of other part of unspecified foot with unspecified severity: Secondary | ICD-10-CM

## 2021-05-03 DIAGNOSIS — M869 Osteomyelitis, unspecified: Secondary | ICD-10-CM

## 2021-05-03 DIAGNOSIS — E11621 Type 2 diabetes mellitus with foot ulcer: Secondary | ICD-10-CM | POA: Diagnosis not present

## 2021-05-03 DIAGNOSIS — E08621 Diabetes mellitus due to underlying condition with foot ulcer: Secondary | ICD-10-CM | POA: Diagnosis not present

## 2021-05-03 DIAGNOSIS — Z794 Long term (current) use of insulin: Secondary | ICD-10-CM

## 2021-05-03 DIAGNOSIS — L84 Corns and callosities: Secondary | ICD-10-CM | POA: Diagnosis not present

## 2021-05-03 LAB — WOUND CULTURE
MICRO NUMBER:: 12651908
SPECIMEN QUALITY:: ADEQUATE

## 2021-05-03 LAB — HOUSE ACCOUNT TRACKING

## 2021-05-03 MED ORDER — DOXYCYCLINE HYCLATE 100 MG PO TABS
100.0000 mg | ORAL_TABLET | Freq: Two times a day (BID) | ORAL | 0 refills | Status: DC
  Filled 2021-05-03: qty 84, 42d supply, fill #0

## 2021-05-03 MED ORDER — AMOXICILLIN-POT CLAVULANATE 875-125 MG PO TABS
1.0000 | ORAL_TABLET | Freq: Two times a day (BID) | ORAL | 0 refills | Status: DC
  Filled 2021-05-03: qty 84, 42d supply, fill #0

## 2021-05-03 NOTE — Patient Instructions (Signed)
Thank you for coming to see me today. It was a pleasure seeing you.  To Do: STOP taking your other antibiotics START taking Augmentin and doxycycline for your toe infection Follow up with podiatry about referral to wound care if they think it is appropriate Follow up with me in 2-3 weeks  If you have any questions or concerns, please do not hesitate to call the office at [phone].  Take Care,   Gwynn Burly

## 2021-05-03 NOTE — Assessment & Plan Note (Signed)
Status post 1st ray amputation with appropriate wound healing at this time.

## 2021-05-03 NOTE — Assessment & Plan Note (Signed)
Recent A1c in August was 11.1.  Continue glycemic control to promote wound healing.

## 2021-05-03 NOTE — Assessment & Plan Note (Signed)
Discussed with patient his right 2nd toe ulceration and concern for osteomyelitis that has developed after a dog bite.  Recent cultures yielded E faecalis and he is currently on ampicillin.  Discussed that he remains risk for amputation in this setting but his hope is to salvage toe which is a reasonable approach to consider.  The superficial wound culture is of unclear significance and will cover broadly with Augmentin and Doxycycline x 6 weeks as there is emerging evidence to support oral therapy vs IV and a PICC line would force him out of work for several weeks.  Discussed consideration of MRI to rule in diagnosis of osteo but at present will not alter management so will defer for now.  He sees his podiatrist later today and will discuss consideration of wound care referral vs continuing with wound care recommendations from their office.  Will follow up in about 2 weeks.

## 2021-05-03 NOTE — Progress Notes (Signed)
[place] for Infect[facility]  Reason for Consult:Osteomyelitis  Referring Provider: Dr Jacqualyn Posey, DPM   HPI:    Martin [name] is a 55 y.o. male with PMHx as below who presents to the clinic for osteomyelitis.   Patient presents today as a new patient due to concern for osteomyelitis of the right second toe.  He thinks today's visit is for a wound care visit.  He is accompanied by his wife.  He also has a history of right partial first ray amputation due to OM with well-healing incision on 01/23/2021 with Dr. Jacqualyn Posey of podiatry. He has continued to have a wound with localized edema and erythema to the distal portion of the 2nd toe without any exposure of bone or tendon that developed after a bite from his dog several weeks ago.  X-rays have not shown osseous erosion, however, this wound is in close proximity to the bone which raises the concern for contiguous osteomyelitis.  He has been on various oral antibiotics.  He was seen on 04/22/2021 by his podiatrist.  At that time WBC was normal, ESR improved from previous, and CRP normal as well.  A wound culture was obtained which grew Enterococcus faecalis.  Patient is currently on ampicillin which was started on 11/22.  He is currently working at [employer] and lifts heavy packages throughout the shift that places a lot of pressure on his toes.  He reports no fevers, chills.  The antibiotics have caused his stomach to be upset.   Patient's Medications  New Prescriptions   AMOXICILLIN-CLAVULANATE (AUGMENTIN) 875-125 MG TABLET    Take 1 tablet by mouth 2 (two) times daily.   DOXYCYCLINE (VIBRA-TABS) 100 MG TABLET    Take 1 tablet (100 mg total) by mouth 2 (two) times daily.  Previous Medications   ATORVASTATIN (LIPITOR) 80 MG TABLET    TAKE 1 TABLET BY MOUTH ONCE A DAY AT BEDTIME   ATORVASTATIN (LIPITOR) 80 MG TABLET    Take 1 tablet (80 mg total) by mouth at bedtime.   BLOOD GLUCOSE MONITORING SUPPL (BLOOD GLUCOSE MONITOR SYSTEM)  W/DEVICE KIT    Use up to four times daily as directed.   CYCLOBENZAPRINE (FLEXERIL) 5 MG TABLET    TAKE 1 TABLET BY MOUTH EVERY 12 HOURS AS NEEDED FOR MUSCLE SPASMS AND PAIN.   DULAGLUTIDE (TRULICITY) 1.24 PY/0.9XI SOPN    Inject 0.75 mg into the skin once a week.   DULAGLUTIDE (TRULICITY) 1.5 PJ/8.2NK SOPN    Inject 1.5 mg into the skin once a week.   EMPAGLIFLOZIN (JARDIANCE) 25 MG TABS TABLET    Take 1 tablet (25 mg total) by mouth daily.   EMPAGLIFLOZIN (JARDIANCE) 25 MG TABS TABLET    Take 1 tablet (25 mg total) by mouth daily.   FUROSEMIDE (LASIX) 20 MG TABLET    Take 1 tablet (20 mg total) by mouth daily as needed.   GABAPENTIN (NEURONTIN) 300 MG CAPSULE    Take 1 capsule (300 mg total) by mouth 3 (three) times daily as needed.   GENTAMICIN CREAM (GARAMYCIN) 0.1 %    Apply 1 application topically 3 (three) times daily.   GLUCOSE BLOOD (FREESTYLE LITE) TEST STRIP    Use three times daily as directed   INSULIN DEGLUDEC (TRESIBA) 200 UNIT/ML FLEXTOUCH PEN    INJECT 160 UNITS UNDER THE SKIN ONCE A DAY   INSULIN LISPRO (HUMALOG KWIKPEN) 100 UNIT/ML KWIKPEN    Inject 4 Units into the skin daily  at the beginning of the largest meal for the day. Expires 28 days after initial use   INSULIN PEN NEEDLE 31G X 6 MM MISC    USE TO INJECT INSULIN TWICE A DAY   INSULIN PEN NEEDLE 31G X 6 MM MISC    USE AS DIRECTED ONCE A DAY   LANCETS (FREESTYLE) LANCETS    Use three times daily as directed   LISINOPRIL (ZESTRIL) 10 MG TABLET    TAKE 1 TABLET BY MOUTH ONCE DAILY.   LISINOPRIL (ZESTRIL) 10 MG TABLET    Take 1 tablet by mouth daily.   MELOXICAM (MOBIC) 15 MG TABLET    Take 1 tablet by mouth daily.   MELOXICAM (MOBIC) 15 MG TABLET    Take 1 tablet (15 mg total) by mouth daily.   METFORMIN (GLUCOPHAGE) 1000 MG TABLET    Take by mouth.   METFORMIN (GLUCOPHAGE-XR) 500 MG 24 HR TABLET    TAKE 2 TABLETS BY MOUTH WITH A MEAL TWICE A DAY.   MUPIROCIN OINTMENT (BACTROBAN) 2 %    Apply 1 application topically 2  (two) times daily.   ONETOUCH VERIO TEST STRIP    USE AS INSTRUCTED 2-3 X A DAY.   OXYCODONE (OXY IR/ROXICODONE) 5 MG IMMEDIATE RELEASE TABLET    Take 1 tablet (5 mg total) by mouth every 4 (four) hours as needed.   SILDENAFIL (VIAGRA) 100 MG TABLET    Take 1 tablet (100 mg total) by mouth up to once daily as needed   SILDENAFIL (VIAGRA) 100 MG TABLET    Take 1/2 to one tablet one hour before sex for E.D.   TIZANIDINE (ZANAFLEX) 4 MG TABLET    TAKE 1 TABLET BY MOUTH EVERY 6 TO 8 HOURS AS NEEDED FOR MUSCLE SPASMS AND PAIN.   TRAMADOL (ULTRAM) 50 MG TABLET    TAKE 1 TABLET BY MOUTH 2 TO 3 TIMES DAILY AS NEEDED FOR PAIN.   TRIAMCINOLONE (KENALOG) 0.025 % OINTMENT    Apply 1 application topically 2 (two) times daily.  Modified Medications   No medications on file  Discontinued Medications   AMOXICILLIN-CLAVULANATE (AUGMENTIN) 875-125 MG TABLET    Take 1 tablet by mouth 2 (two) times daily.   AMPICILLIN (PRINCIPEN) 500 MG CAPSULE    Take 1 capsule (500 mg total) by mouth 4 (four) times daily.   CEPHALEXIN (KEFLEX) 500 MG CAPSULE    Take 1 capsule (500 mg total) by mouth 4 (four) times daily for 7 days   SULFAMETHOXAZOLE-TRIMETHOPRIM (BACTRIM DS) 800-160 MG TABLET    Take 1 tablet by mouth 2 (two) times daily.      Past Medical History:  Diagnosis Date   Chest pain    Diabetes mellitus    Gout    Hyperlipidemia    Hypertension    Obesity     Social History   Tobacco Use   Smoking status: Never   Smokeless tobacco: Current    Types: Chew  Vaping Use   Vaping Use: Never used  Substance Use Topics   Alcohol use: Yes    Alcohol/week: 0.0 standard drinks    Comment: occ   Drug use: No    Family History  Problem Relation Age of Onset   Diabetes Maternal Grandfather    Kidney disease Other     No Known Allergies  Review of Systems  All other systems reviewed and are negative. Except as noted in HPI.     OBJECTIVE:    Vitals:  05/03/21 1016  BP: (!) 162/81  Pulse: 70   Temp: 98 F (36.7 C)  TempSrc: Oral  SpO2: 96%  Weight: 265 lb (120.2 kg)  Height: 5' 11"  (1.803 m)     Body mass index is 36.96 kg/m.  Physical Exam Constitutional:      Appearance: Normal appearance.  HENT:     Head: Normocephalic and atraumatic.  Pulmonary:     Effort: Pulmonary effort is normal. No respiratory distress.  Musculoskeletal:     Comments: S/p first ray amputation. See picture of 2nd toe.   Skin:    General: Skin is warm and dry.  Neurological:     General: No focal deficit present.     Mental Status: He is alert and oriented to person, place, and time.  Psychiatric:        Mood and Affect: Mood normal.        Behavior: Behavior normal.      Labs and Microbiology:  CBC Latest Ref Rng & Units 04/22/2021 01/25/2021 01/24/2021  WBC 3.4 - 10.8 x10E3/uL 9.9 11.4(H) 11.2(H)  Hemoglobin 13.0 - 17.7 g/dL 14.7 12.9(L) 13.7  Hematocrit 37.5 - 51.0 % 45.5 40.1 42.8  Platelets 150 - 450 x10E3/uL 334 282 306   CMP Latest Ref Rng & Units 04/22/2021 01/24/2021 01/23/2021  Glucose 70 - 99 mg/dL 282(H) 97 121(H)  BUN 6 - 24 mg/dL 16 14 15   Creatinine 0.76 - 1.27 mg/dL 1.01 0.78 0.86  Sodium 134 - 144 mmol/L 135 136 136  Potassium 3.5 - 5.2 mmol/L 4.7 3.8 4.2  Chloride 96 - 106 mmol/L 99 104 103  CO2 20 - 29 mmol/L 22 25 24   Calcium 8.7 - 10.2 mg/dL 9.5 9.3 9.5  Total Protein 6.5 - 8.1 g/dL - - -  Total Bilirubin 0.3 - 1.2 mg/dL - - -  Alkaline Phos 38 - 126 U/L - - -  AST 15 - 41 U/L - - -  ALT 0 - 44 U/L - - -       ASSESSMENT & PLAN:    Osteomyelitis of great toe of right foot (HCC) Status post 1st ray amputation with appropriate wound healing at this time.   Open wound of toe Discussed with patient his right 2nd toe ulceration and concern for osteomyelitis that has developed after a dog bite.  Recent cultures yielded E faecalis and he is currently on ampicillin.  Discussed that he remains risk for amputation in this setting but his hope is to salvage  toe which is a reasonable approach to consider.  The superficial wound culture is of unclear significance and will cover broadly with Augmentin and Doxycycline x 6 weeks as there is emerging evidence to support oral therapy vs IV and a PICC line would force him out of work for several weeks.  Discussed consideration of MRI to rule in diagnosis of osteo but at present will not alter management so will defer for now.  He sees his podiatrist later today and will discuss consideration of wound care referral vs continuing with wound care recommendations from their office.  Will follow up in about 2 weeks.  Type 2 diabetes mellitus (HCC) Recent A1c in August was 11.1.  Continue glycemic control to promote wound healing.      Raynelle High[facility] for [facility] 05/03/2021, 11:14 AM

## 2021-05-05 ENCOUNTER — Other Ambulatory Visit (HOSPITAL_COMMUNITY): Payer: Self-pay

## 2021-05-05 MED ORDER — CARESTART COVID-19 HOME TEST VI KIT
PACK | 0 refills | Status: DC
  Filled 2021-05-05: qty 4, 4d supply, fill #0

## 2021-05-05 NOTE — Progress Notes (Signed)
Subjective: Martin [name] is a 55 y.o. is seen today in office today for follow-up evaluation for wound on the right second toe.  Due to the wound culture as well as close proximity to the bone concern for osteolysis I referred him to infectious disease.  The patient and the wife seemed upset stated they thought they were seeing wound care.  I personally spoke to the wife over the phone and she kept seeing wound care and I tried to tell her she was seeing infectious disease and not the wound care center.  They went to wound care today and put him back on Augmentin, doxycycline which she was on originally.  His wife has been changing the dressing daily and applying Aquacel to the wound.  Unfortunately he has continued to work and he has to continue to work.  He works at [employer] and pushing with his feet 60 hours a week.  Denies any fevers or chills.  Objective: General: No acute distress, AAOx3-wife present DP/PT pulses palpable 2/4, CRT < 3 sec to all digits.  Right foot: On the previous hallux amputation.  Incision site is healed.  Superficial granular wound present to the distal aspect of second toe.  There is decreased edema and erythema to the toe.  There is no ascending cellulitis.  No fluctuation crepitation was noted.  No significant drainage.  There is a preulcerative area to the distal third toe today. Left hallux: Superficial dried blood present distal aspect of toe without any open sore.  There is no swelling or redness or any drainage. Hammertoes present. No pain with calf compression, swelling, warmth, erythema.   Assessment and Plan:  Status post right partial first ray amputation, improving; right second toe infection; pre-ulcerative 3rd toe and left hallux  -Treatment options discussed including all alternatives, risks, and complications -I remain very concerned about the right second toe concern for amputation.  Unfortunately has continued to work and he could not come  back out of work till also healed.  Would continue antibiotics per infectious disease.  Also been referred to the wound care center.  For now continue Aquacel dressing changes daily. -Offloading for the third toe on the right foot as well.  If needed we can do the tenotomy.  Monitor very closely unfortunately do think these issues are coming from being on his feet is much as he is.   Vivi Barrack DPM

## 2021-05-10 ENCOUNTER — Other Ambulatory Visit (HOSPITAL_COMMUNITY): Payer: Self-pay

## 2021-05-17 ENCOUNTER — Other Ambulatory Visit (HOSPITAL_COMMUNITY): Payer: Self-pay

## 2021-05-17 MED ORDER — TRESIBA FLEXTOUCH 200 UNIT/ML ~~LOC~~ SOPN
110.0000 [IU] | PEN_INJECTOR | Freq: Every day | SUBCUTANEOUS | 3 refills | Status: DC
  Filled 2021-05-17: qty 45, 81d supply, fill #0
  Filled 2021-07-19: qty 33, 60d supply, fill #1
  Filled 2021-10-11: qty 33, 60d supply, fill #2
  Filled 2021-12-22: qty 33, 60d supply, fill #3

## 2021-05-18 ENCOUNTER — Other Ambulatory Visit (HOSPITAL_COMMUNITY): Payer: Self-pay

## 2021-05-19 ENCOUNTER — Other Ambulatory Visit: Payer: Self-pay

## 2021-05-19 ENCOUNTER — Encounter: Payer: Self-pay | Admitting: Internal Medicine

## 2021-05-19 ENCOUNTER — Other Ambulatory Visit (HOSPITAL_COMMUNITY): Payer: Self-pay

## 2021-05-19 ENCOUNTER — Ambulatory Visit (INDEPENDENT_AMBULATORY_CARE_PROVIDER_SITE_OTHER): Payer: 59 | Admitting: Podiatry

## 2021-05-19 ENCOUNTER — Ambulatory Visit (INDEPENDENT_AMBULATORY_CARE_PROVIDER_SITE_OTHER): Payer: 59

## 2021-05-19 ENCOUNTER — Ambulatory Visit (INDEPENDENT_AMBULATORY_CARE_PROVIDER_SITE_OTHER): Payer: 59 | Admitting: Internal Medicine

## 2021-05-19 VITALS — BP 164/84 | HR 83 | Temp 98.6°F | Wt 262.0 lb

## 2021-05-19 DIAGNOSIS — L97509 Non-pressure chronic ulcer of other part of unspecified foot with unspecified severity: Secondary | ICD-10-CM

## 2021-05-19 DIAGNOSIS — L84 Corns and callosities: Secondary | ICD-10-CM | POA: Diagnosis not present

## 2021-05-19 DIAGNOSIS — M86171 Other acute osteomyelitis, right ankle and foot: Secondary | ICD-10-CM | POA: Diagnosis not present

## 2021-05-19 DIAGNOSIS — M25562 Pain in left knee: Secondary | ICD-10-CM | POA: Diagnosis not present

## 2021-05-19 DIAGNOSIS — B952 Enterococcus as the cause of diseases classified elsewhere: Secondary | ICD-10-CM | POA: Diagnosis not present

## 2021-05-19 DIAGNOSIS — E11621 Type 2 diabetes mellitus with foot ulcer: Secondary | ICD-10-CM | POA: Diagnosis not present

## 2021-05-19 DIAGNOSIS — Z794 Long term (current) use of insulin: Secondary | ICD-10-CM

## 2021-05-19 DIAGNOSIS — S91109A Unspecified open wound of unspecified toe(s) without damage to nail, initial encounter: Secondary | ICD-10-CM

## 2021-05-19 DIAGNOSIS — I10 Essential (primary) hypertension: Secondary | ICD-10-CM | POA: Diagnosis not present

## 2021-05-19 MED ORDER — MELOXICAM 15 MG PO TABS
15.0000 mg | ORAL_TABLET | Freq: Every day | ORAL | 1 refills | Status: DC
  Filled 2021-05-19: qty 30, 30d supply, fill #0
  Filled 2022-04-12: qty 30, 30d supply, fill #1

## 2021-05-19 NOTE — Progress Notes (Signed)
[facility]  CHIEF COMPLAINT:    Follow up for osteomyelitis  SUBJECTIVE:    Martin [name] is a 55 y.o. male with PMHx as below who presents to the clinic for osteomyelitis.   Patient is here today for planned 2 week follow up. He was seen on 11/28 due to concern for OM of the right 2nd toe and has been following with podiatry (Dr Jacqualyn Posey) whom he saw as well on 05/03/21.    He also has a history of right partial first ray amputation due to OM with well-healing incision on 01/23/2021 with Dr. Jacqualyn Posey of podiatry. He has continued to have a wound with localized edema and erythema to the distal portion of the 2nd toe without any exposure of bone or tendon that developed after a bite from his dog several weeks ago.  X-rays have not shown osseous erosion, however, this wound is in close proximity to the bone which raises the concern for contiguous osteomyelitis.  He has been on various oral antibiotics.    At visit on 11/28 he was placed on doxycycline and Augmentin (superficial wound cx = E faecalis).  His CRP on 11/17 was 4 and ESR 39.  His A1c in August is 11.1.  He reports no fevers or chills.  Wound has been stable and no further drainage.  He continues to work with heavy lifting and pressure being applied to his toes.  He is wearing slide sandals today.  He also notes edema. He sees Therapist, music.   Please see A&P for the details of today's visit and status of the patient's medical problems.   Patient's Medications  New Prescriptions   No medications on file  Previous Medications   AMOXICILLIN-CLAVULANATE (AUGMENTIN) 875-125 MG TABLET    Take 1 tablet by mouth 2 (two) times daily.   ATORVASTATIN (LIPITOR) 80 MG TABLET    TAKE 1 TABLET BY MOUTH ONCE A DAY AT BEDTIME   ATORVASTATIN (LIPITOR) 80 MG TABLET    Take 1 tablet (80 mg total) by mouth at bedtime.   BLOOD GLUCOSE MONITORING SUPPL (BLOOD GLUCOSE MONITOR SYSTEM) W/DEVICE KIT    Use up to  four times daily as directed.   COVID-19 AT HOME ANTIGEN TEST (CARESTART COVID-19 HOME TEST) KIT    Use as directed   CYCLOBENZAPRINE (FLEXERIL) 5 MG TABLET    TAKE 1 TABLET BY MOUTH EVERY 12 HOURS AS NEEDED FOR MUSCLE SPASMS AND PAIN.   DOXYCYCLINE (VIBRA-TABS) 100 MG TABLET    Take 1 tablet (100 mg total) by mouth 2 (two) times daily.   DULAGLUTIDE (TRULICITY) 1.02 HE/5.2DP SOPN    Inject 0.75 mg into the skin once a week.   DULAGLUTIDE (TRULICITY) 1.5 OE/4.2PN SOPN    Inject 1.5 mg into the skin once a week.   EMPAGLIFLOZIN (JARDIANCE) 25 MG TABS TABLET    Take 1 tablet (25 mg total) by mouth daily.   EMPAGLIFLOZIN (JARDIANCE) 25 MG TABS TABLET    Take 1 tablet (25 mg total) by mouth daily.   FUROSEMIDE (LASIX) 20 MG TABLET    Take 1 tablet (20 mg total) by mouth daily as needed.   GABAPENTIN (NEURONTIN) 300 MG CAPSULE    Take 1 capsule (300 mg total) by mouth 3 (three) times daily as needed.   GENTAMICIN CREAM (GARAMYCIN) 0.1 %    Apply 1 application topically 3 (three) times daily.   GLUCOSE BLOOD (FREESTYLE LITE) TEST STRIP  Use three times daily as directed   INSULIN DEGLUDEC (TRESIBA FLEXTOUCH) 200 UNIT/ML FLEXTOUCH PEN    Inject 110 Units into the skin daily.   INSULIN LISPRO (HUMALOG KWIKPEN) 100 UNIT/ML KWIKPEN    Inject 4 Units into the skin daily at the beginning of the largest meal for the day. Expires 28 days after initial use   INSULIN PEN NEEDLE 31G X 6 MM MISC    USE TO INJECT INSULIN TWICE A DAY   LANCETS (FREESTYLE) LANCETS    Use three times daily as directed   LISINOPRIL (ZESTRIL) 10 MG TABLET    TAKE 1 TABLET BY MOUTH ONCE DAILY.   LISINOPRIL (ZESTRIL) 10 MG TABLET    Take 1 tablet by mouth daily.   MELOXICAM (MOBIC) 15 MG TABLET    Take 1 tablet by mouth daily.   MELOXICAM (MOBIC) 15 MG TABLET    Take 1 tablet (15 mg total) by mouth daily.   MELOXICAM (MOBIC) 15 MG TABLET    Take 1 tablet (15 mg total) by mouth daily.   METFORMIN (GLUCOPHAGE) 1000 MG TABLET    Take by  mouth.   METFORMIN (GLUCOPHAGE-XR) 500 MG 24 HR TABLET    TAKE 2 TABLETS BY MOUTH WITH A MEAL TWICE A DAY.   MUPIROCIN OINTMENT (BACTROBAN) 2 %    Apply 1 application topically 2 (two) times daily.   ONETOUCH VERIO TEST STRIP    USE AS INSTRUCTED 2-3 X A DAY.   OXYCODONE (OXY IR/ROXICODONE) 5 MG IMMEDIATE RELEASE TABLET    Take 1 tablet (5 mg total) by mouth every 4 (four) hours as needed.   SILDENAFIL (VIAGRA) 100 MG TABLET    Take 1 tablet (100 mg total) by mouth up to once daily as needed   SILDENAFIL (VIAGRA) 100 MG TABLET    Take 1/2 to one tablet one hour before sex for E.D.   TIZANIDINE (ZANAFLEX) 4 MG TABLET    TAKE 1 TABLET BY MOUTH EVERY 6 TO 8 HOURS AS NEEDED FOR MUSCLE SPASMS AND PAIN.   TRAMADOL (ULTRAM) 50 MG TABLET    TAKE 1 TABLET BY MOUTH 2 TO 3 TIMES DAILY AS NEEDED FOR PAIN.   TRIAMCINOLONE (KENALOG) 0.025 % OINTMENT    Apply 1 application topically 2 (two) times daily.  Modified Medications   No medications on file  Discontinued Medications   No medications on file      Past Medical History:  Diagnosis Date   Chest pain    Diabetes mellitus    Gout    Hyperlipidemia    Hypertension    Obesity     Social History   Tobacco Use   Smoking status: Never   Smokeless tobacco: Current    Types: Chew  Vaping Use   Vaping Use: Never used  Substance Use Topics   Alcohol use: Yes    Alcohol/week: 0.0 standard drinks    Comment: occ   Drug use: No    Family History  Problem Relation Age of Onset   Diabetes Maternal Grandfather    Kidney disease Other     No Known Allergies  Review of Systems  All other systems reviewed and are negative. Except as noted.   OBJECTIVE:    Vitals:   05/19/21 1425  BP: (!) 164/84  Pulse: 83  Temp: 98.6 F (37 C)  TempSrc: Temporal  SpO2: 98%  Weight: 262 lb (118.8 kg)   Body mass index is 36.54 kg/m.  Physical Exam Constitutional:  Appearance: Normal appearance.  Pulmonary:     Effort: Pulmonary effort is  normal. No respiratory distress.  Musculoskeletal:     Comments: S/p right great toe amputation. 2nd toe with swelling and ulceration without drainage on right.  Skin:    General: Skin is warm and dry.     Findings: No rash.  Neurological:     General: No focal deficit present.     Mental Status: He is alert and oriented to person, place, and time.  Psychiatric:        Mood and Affect: Mood normal.        Behavior: Behavior normal.     Labs and Microbiology: CBC Latest Ref Rng & Units 04/22/2021 01/25/2021 01/24/2021  WBC 3.4 - 10.8 x10E3/uL 9.9 11.4(H) 11.2(H)  Hemoglobin 13.0 - 17.7 g/dL 14.7 12.9(L) 13.7  Hematocrit 37.5 - 51.0 % 45.5 40.1 42.8  Platelets 150 - 450 x10E3/uL 334 282 306   CMP Latest Ref Rng & Units 04/22/2021 01/24/2021 01/23/2021  Glucose 70 - 99 mg/dL 282(H) 97 121(H)  BUN 6 - 24 mg/dL 16 14 15   Creatinine 0.76 - 1.27 mg/dL 1.01 0.78 0.86  Sodium 134 - 144 mmol/L 135 136 136  Potassium 3.5 - 5.2 mmol/L 4.7 3.8 4.2  Chloride 96 - 106 mmol/L 99 104 103  CO2 20 - 29 mmol/L 22 25 24   Calcium 8.7 - 10.2 mg/dL 9.5 9.3 9.5  Total Protein 6.5 - 8.1 g/dL - - -  Total Bilirubin 0.3 - 1.2 mg/dL - - -  Alkaline Phos 38 - 126 U/L - - -  AST 15 - 41 U/L - - -  ALT 0 - 44 U/L - - -        ASSESSMENT & PLAN:    Open wound of toe Will continue with doxycycline and Augmentin due to concern for OM of the right 2nd toe x 6 weeks.  Check CBC, BMP, ESR, CRP today.  Also discussed continuing wound care, glycemic control, and appropriate off loading.  Recommended leg elevation to help with lower extremity edema.  Hopefully this infection can be effectively dealt with so he is able to continue working.     Orders Placed This Encounter  Procedures   Basic metabolic panel    Order Specific Question:   Has the patient fasted?    Answer:   No   CBC   Sedimentation rate   C-reactive protein      [facility] 05/19/2021, 2:51 PM

## 2021-05-19 NOTE — Assessment & Plan Note (Signed)
Will continue with doxycycline and Augmentin due to concern for OM of the right 2nd toe x 6 weeks.  Check CBC, BMP, ESR, CRP today.  Also discussed continuing wound care, glycemic control, and appropriate off loading.  Recommended leg elevation to help with lower extremity edema.  Hopefully this infection can be effectively dealt with so he is able to continue working.

## 2021-05-20 ENCOUNTER — Encounter: Payer: 59 | Admitting: Podiatry

## 2021-05-20 ENCOUNTER — Ambulatory Visit: Payer: 59

## 2021-05-20 LAB — BASIC METABOLIC PANEL
BUN: 14 mg/dL (ref 7–25)
CO2: 27 mmol/L (ref 20–32)
Calcium: 9.8 mg/dL (ref 8.6–10.3)
Chloride: 103 mmol/L (ref 98–110)
Creat: 0.85 mg/dL (ref 0.70–1.30)
Glucose, Bld: 167 mg/dL — ABNORMAL HIGH (ref 65–99)
Potassium: 4.4 mmol/L (ref 3.5–5.3)
Sodium: 138 mmol/L (ref 135–146)

## 2021-05-20 LAB — C-REACTIVE PROTEIN: CRP: 1.7 mg/L (ref <8.0)

## 2021-05-20 LAB — CBC
HCT: 44.5 % (ref 38.5–50.0)
Hemoglobin: 15 g/dL (ref 13.2–17.1)
MCH: 27.8 pg (ref 27.0–33.0)
MCHC: 33.7 g/dL (ref 32.0–36.0)
MCV: 82.6 fL (ref 80.0–100.0)
MPV: 11.5 fL (ref 7.5–12.5)
Platelets: 277 10*3/uL (ref 140–400)
RBC: 5.39 10*6/uL (ref 4.20–5.80)
RDW: 13.1 % (ref 11.0–15.0)
WBC: 8.7 10*3/uL (ref 3.8–10.8)

## 2021-05-20 LAB — SEDIMENTATION RATE: Sed Rate: 2 mm/h (ref 0–20)

## 2021-05-21 ENCOUNTER — Telehealth: Payer: Self-pay

## 2021-05-21 NOTE — Progress Notes (Signed)
Subjective: Martin [name] is a 55 y.o. is seen today in office today for follow-up evaluation for wound on the right second toe.  He also is preulcerative areas to the first toe on the left as well as left second toe and right third toe.  He regards to the right second toe wound he is follow-up with infectious disease earlier today and blood work was done.  Still on antibiotics for osteomyelitis.  He said the toe has turned to the side some since I saw him last.  He thinks the wound is doing better.  His wife is giving Aquacel dressing changes.  There is no increase in swelling or any drainage or any redness or swelling.  No fevers or chills and reports he has no new concerns otherwise.  Unfortunately he has continued to work and he has to continue to work.  He works at [employer] and pushing with his feet 60 hours a week.    Objective: General: No acute distress, AAOx3-wife present DP/PT pulses palpable 2/4, CRT < 3 sec to all digits.  Right foot: On the previous hallux amputation.  There are some callus formation today and upon debridement there is 1 very superficial area skin breakdown noted that there is no drainage or pus or edema, erythema.  The distal aspect of the second toe of the toe has turned with the tip of the toe medial.  Thick hyperkeratotic tissue and upon debridement there is a granular wound present there is no probing or exposed bone.  There is no fluctuation or crepitation for this matter.  Some slight edema erythema the distal portion of toe still remains.  There is no malodor. Preulcerative areas noted to the distal aspect of right third toe, left first and second digits.  No skin breakdown in these areas needs to monitor this very closely. Hammertoes present. No pain with calf compression, swelling, warmth, erythema.   Assessment and Plan:  Status post right partial first ray amputation, improving; right second toe infection; pre-ulcerative 3rd toe and left  hallux  -Treatment options discussed including all alternatives, risks, and complications -X-rays obtained reviewed.  There is cortical destruction noted to the majority the distal phalanx of the second toe. -I discussed with him osteomyelitis and the wound which we have been treating and seen infectious disease.  Blood work was ordered today.  Continue antibiotics.  Discussed with him irritation of the tip of the toe however he cannot come out of work at this point.  Discussed making with very close monitoring of this toe.  Dispensed further toe caps and also did not personally order him some more to help take pressure off of them.  Discussed dispensing the shoes to help with further offloading.  The wife want to wait until after the first the year.  I did discuss that this could help limit more pressure.  Also he is wearing shoes without significant stability which I think is causing pressure on the toes.  Patient will be going to the beach on vacation and advised him to stay out of the sand as well as the water.  Patient was not happy about this this but discussed with him if he wants to try to still save the toe I would advise against this.  Vivi Barrack DPM    -I remain very concerned about the right second toe concern for amputation.  Unfortunately has continued to work and he could not come back out of work till also  healed.  Would continue antibiotics per infectious disease.  Also been referred to the wound care center.  For now continue Aquacel dressing changes daily. -Offloading for the third toe on the right foot as well.  If needed we can do the tenotomy.  Monitor very closely unfortunately do think these issues are coming from being on his feet is much as he is.   Vivi Barrack DPM

## 2021-05-21 NOTE — Telephone Encounter (Signed)
Called patient to relay results, no answer. Left HIPAA compliant voicemail requesting callback.   Kanoelani Dobies D Abbygael Curtiss, RN  

## 2021-05-21 NOTE — Telephone Encounter (Signed)
-----  Message from Mignon Pine, DO sent at 05/20/2021  3:06 PM EST ----- Please let patient know that WBC is normal and inflammatory markers (ESR, CRP) also are normal which is reassuring with infection.  Continue current antibiotics and follow up as planned.

## 2021-06-01 ENCOUNTER — Other Ambulatory Visit (HOSPITAL_COMMUNITY): Payer: Self-pay

## 2021-06-02 ENCOUNTER — Other Ambulatory Visit (HOSPITAL_COMMUNITY): Payer: Self-pay

## 2021-06-02 ENCOUNTER — Telehealth: Payer: Self-pay | Admitting: Podiatry

## 2021-06-02 NOTE — Telephone Encounter (Signed)
I had ordered toe caps for him but still waiting on the foam ones. I did receive some silicone ones. I left him a VM to let him know this and if he wanted to come by tomorrow get them I will be in the office.

## 2021-06-07 ENCOUNTER — Other Ambulatory Visit (HOSPITAL_COMMUNITY): Payer: Self-pay

## 2021-06-09 ENCOUNTER — Telehealth: Payer: Self-pay | Admitting: Podiatry

## 2021-06-09 NOTE — Telephone Encounter (Signed)
Patient calling to see if supplies are ready for pick up?  Will you put them at the front desk for him and he will swing by and get them?

## 2021-06-14 ENCOUNTER — Other Ambulatory Visit: Payer: Self-pay

## 2021-06-14 ENCOUNTER — Other Ambulatory Visit (HOSPITAL_COMMUNITY): Payer: Self-pay

## 2021-06-14 ENCOUNTER — Ambulatory Visit (INDEPENDENT_AMBULATORY_CARE_PROVIDER_SITE_OTHER): Payer: 59 | Admitting: Podiatry

## 2021-06-14 ENCOUNTER — Ambulatory Visit: Payer: 59

## 2021-06-14 ENCOUNTER — Ambulatory Visit: Payer: 59 | Admitting: Registered"

## 2021-06-14 DIAGNOSIS — M2041 Other hammer toe(s) (acquired), right foot: Secondary | ICD-10-CM | POA: Diagnosis not present

## 2021-06-14 DIAGNOSIS — L03031 Cellulitis of right toe: Secondary | ICD-10-CM | POA: Diagnosis not present

## 2021-06-14 DIAGNOSIS — L84 Corns and callosities: Secondary | ICD-10-CM | POA: Diagnosis not present

## 2021-06-14 DIAGNOSIS — E10621 Type 1 diabetes mellitus with foot ulcer: Secondary | ICD-10-CM | POA: Diagnosis not present

## 2021-06-14 DIAGNOSIS — M86171 Other acute osteomyelitis, right ankle and foot: Secondary | ICD-10-CM

## 2021-06-14 DIAGNOSIS — E08621 Diabetes mellitus due to underlying condition with foot ulcer: Secondary | ICD-10-CM | POA: Diagnosis not present

## 2021-06-15 ENCOUNTER — Other Ambulatory Visit: Payer: Self-pay | Admitting: Podiatry

## 2021-06-15 ENCOUNTER — Other Ambulatory Visit (HOSPITAL_COMMUNITY): Payer: Self-pay

## 2021-06-15 MED ORDER — MUPIROCIN 2 % EX OINT
1.0000 "application " | TOPICAL_OINTMENT | Freq: Two times a day (BID) | CUTANEOUS | 2 refills | Status: DC
  Filled 2021-06-15: qty 22, 11d supply, fill #0

## 2021-06-15 NOTE — Telephone Encounter (Signed)
Patient's wife is calling for status of the mupirocin ointment that was supposed to be sent to pharmacy on file,not there. Please advise

## 2021-06-16 NOTE — Progress Notes (Addendum)
Subjective: Martin [name] is a 56 y.o. is seen today in office today for follow-up evaluation for wound on the right second toe.  States the toe is doing better but is getting frustrated.  The right third toenail has a wound to the tip of the toe and some swelling or redness.  He is still on antibiotics per infectious disease.  Denies any fevers or chills.  No significant drainage or pus.  He has no pain.  Unfortunately he has continued to work and he has to continue to work.  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   Objective: General: No acute distress, AAOx3-wife present DP/PT pulses palpable 2/4, CRT < 3 sec to all digits.  Right foot: On the previous hallux amputation.  This is well-healed.  There is digital swelling noted the right second toe with a superficial granular wound without any probing to bone, amount or tunneling.  Minimal edema there is no erythema or warmth.  Today there is a new wound present on the distal aspect of the right third toe with localized edema and erythema to the distal portion of toe with a granular wound distally.  There is no probing to bone, undermining or tunneling.  No fluctuation or crepitation.  Malodor. Preulcerative areas noted to the distal aspect of left first and second digits.  No skin breakdown in these areas needs to monitor this very closely. Hammertoes present. No pain with calf compression, swelling, warmth, erythema.   Assessment and Plan:  Status post right partial first ray amputation, improving; right second toe infection; pre-ulcerative left first and second toes  -Treatment options discussed including all alternatives, risks, and complications -Repeat x-rays revealed chronic osteomyelitis of the right second distal phalanx.  No other evidence of acute osteomyelitis or soft tissue emphysema. -I am very concerned about his feet.  Discussed this again with him today.  The patient is  frustrated with the wounds on his feet.  I have tried different offloading techniques.  We have tried different pads and cushions.  Ultimately dispensed diabetic shoes today but discussed with him he is to break them and slip for wearing them at work and he was seen today by our orthotist, Aaron Edelman for this.  Unfortunately I think his job has a lot to do with his issues and he is on his feet several hours a day wearing shoes on hard surfaces pushing and carrying heavy boxes.  I think that he needs to apply for disability or get a job that sits. -Debrided the preulcerative lesion on the left first and second toes have any complications.  Continue offloading. -Monitor for any clinical signs or symptoms of infection and directed to call the office immediately should any occur or go to the ER.  Trula Slade DPM  -X-rays obtained reviewed.  There is cortical destruction noted to the majority the distal phalanx of the second toe. -I discussed with him osteomyelitis and the wound which we have been treating and seen infectious disease.  Blood work was ordered today.  Continue antibiotics.  Discussed with him irritation of the tip of the toe however he cannot come out of work at this point.  Discussed making with very close monitoring of this toe.  Dispensed further toe caps and also did not personally order him some more to help take pressure off of them.  Discussed dispensing the shoes to help with further offloading.  The wife  want to wait until after the first the year.  I did discuss that this could help limit more pressure.  Also he is wearing shoes without significant stability which I think is causing pressure on the toes.  Patient will be going to the beach on vacation and advised him to stay out of the sand as well as the water.  Patient was not happy about this this but discussed with him if he wants to try to still save the toe I would advise against this.  Trula Slade DPM    -I remain very  concerned about the right second toe concern for amputation.  Unfortunately has continued to work and he could not come back out of work till also healed.  Would continue antibiotics per infectious disease.  Also been referred to the wound care center.  For now continue Aquacel dressing changes daily. -Offloading for the third toe on the right foot as well.  If needed we can do the tenotomy.  Monitor very closely unfortunately do think these issues are coming from being on his feet is much as he is.   Trula Slade DPM

## 2021-06-17 NOTE — Progress Notes (Signed)
[facility]  CHIEF COMPLAINT:    Follow up for osteomyelitis  SUBJECTIVE:    Martin [name] is a 56 y.o. male with PMHx as below who presents to the clinic for osteomyelitis.   Patient is here today for planned 4 week follow up. He was seen on 11/28 and 12/14 due to concern for OM of the right 2nd toe and has been following with podiatry (Dr Jacqualyn Posey) whom he saw as well on 05/03/21, 05/19/21, and 06/14/21.  Patient has a history of right partial first ray amputation due to OM with well-healing incision on 01/23/2021 with Dr. Jacqualyn Posey of podiatry. He has continued to have a wound with localized edema and erythema to the distal portion of the 2nd toe without any exposure of bone or tendon that developed after a bite from his dog several weeks ago.  X-rays previously had not shown osseous erosion, however, the wound is in close proximity to the bone which raises the concern for contiguous osteomyelitis.  He has been on various oral antibiotics prior to being referred to our clinic.  At visit on 11/28 he was placed on doxycycline and Augmentin (superficial wound cx = E faecalis) for a planned 6 week course which he has completed yesterday.    His CRP on 11/17 was 4 and ESR 39.  His A1c in August is 11.1.  At follow up on 12/14 his ESR and CRP were both normalized.  At 1/9 podiatry visit he was noted to have a new wound to distal aspect of right 3rd toe.  Unfortunately he has continued to work at [employer] out of necessity which requires him to be on his feet for several hours, pushing boxes, and placing a lot of pressure on his toes.  His podiatrist has expressed his concern that this is contributing to ongoing issues.  Repeat x rays done at his podiatry office revealed chronic osteomyelitis of the right second distal phalanx.  No other evidence of acute osteomyelitis or soft tissue emphysema.  He was placed in DM shoes at that time and reports a lot of improvement  since that time.  He reports that swelling continues to be an issue but is improving.  He reports no new concerns.   Please see A&P for the details of today's visit and status of the patient's medical problems.   Patient's Medications  New Prescriptions   No medications on file  Previous Medications   ATORVASTATIN (LIPITOR) 80 MG TABLET    TAKE 1 TABLET BY MOUTH ONCE A DAY AT BEDTIME   ATORVASTATIN (LIPITOR) 80 MG TABLET    Take 1 tablet (80 mg total) by mouth at bedtime.   BLOOD GLUCOSE MONITORING SUPPL (BLOOD GLUCOSE MONITOR SYSTEM) W/DEVICE KIT    Use up to four times daily as directed.   COVID-19 AT HOME ANTIGEN TEST (CARESTART COVID-19 HOME TEST) KIT    Use as directed   CYCLOBENZAPRINE (FLEXERIL) 5 MG TABLET    TAKE 1 TABLET BY MOUTH EVERY 12 HOURS AS NEEDED FOR MUSCLE SPASMS AND PAIN.   DULAGLUTIDE (TRULICITY) 4.03 KV/4.2VZ SOPN    Inject 0.75 mg into the skin once a week.   DULAGLUTIDE (TRULICITY) 1.5 DG/3.8VF SOPN    Inject 1.5 mg into the skin once a week.   EMPAGLIFLOZIN (JARDIANCE) 25 MG TABS TABLET    Take 1 tablet (25 mg total) by mouth daily.   EMPAGLIFLOZIN (JARDIANCE) 25 MG TABS TABLET  Take 1 tablet (25 mg total) by mouth daily.   FUROSEMIDE (LASIX) 20 MG TABLET    Take 1 tablet (20 mg total) by mouth daily as needed.   GABAPENTIN (NEURONTIN) 300 MG CAPSULE    Take 1 capsule (300 mg total) by mouth 3 (three) times daily as needed.   GENTAMICIN CREAM (GARAMYCIN) 0.1 %    Apply 1 application topically 3 (three) times daily.   GLUCOSE BLOOD (FREESTYLE LITE) TEST STRIP    Use three times daily as directed   INSULIN DEGLUDEC (TRESIBA FLEXTOUCH) 200 UNIT/ML FLEXTOUCH PEN    Inject 110 Units into the skin daily.   INSULIN LISPRO (HUMALOG KWIKPEN) 100 UNIT/ML KWIKPEN    Inject 4 Units into the skin daily at the beginning of the largest meal for the day. Expires 28 days after initial use   INSULIN PEN NEEDLE 31G X 6 MM MISC    USE TO INJECT INSULIN TWICE A DAY   LANCETS (FREESTYLE)  LANCETS    Use three times daily as directed   LISINOPRIL (ZESTRIL) 10 MG TABLET    TAKE 1 TABLET BY MOUTH ONCE DAILY.   LISINOPRIL (ZESTRIL) 10 MG TABLET    Take 1 tablet by mouth daily.   MELOXICAM (MOBIC) 15 MG TABLET    Take 1 tablet by mouth daily.   MELOXICAM (MOBIC) 15 MG TABLET    Take 1 tablet (15 mg total) by mouth daily.   MELOXICAM (MOBIC) 15 MG TABLET    Take 1 tablet (15 mg total) by mouth daily.   METFORMIN (GLUCOPHAGE) 1000 MG TABLET    Take by mouth.   METFORMIN (GLUCOPHAGE-XR) 500 MG 24 HR TABLET    TAKE 2 TABLETS BY MOUTH WITH A MEAL TWICE A DAY.   MUPIROCIN OINTMENT (BACTROBAN) 2 %    Apply 1 application topically 2 (two) times daily.   ONETOUCH VERIO TEST STRIP    USE AS INSTRUCTED 2-3 X A DAY.   OXYCODONE (OXY IR/ROXICODONE) 5 MG IMMEDIATE RELEASE TABLET    Take 1 tablet (5 mg total) by mouth every 4 (four) hours as needed.   SILDENAFIL (VIAGRA) 100 MG TABLET    Take 1 tablet (100 mg total) by mouth up to once daily as needed   SILDENAFIL (VIAGRA) 100 MG TABLET    Take 1/2 to one tablet one hour before sex for E.D.   TRAMADOL (ULTRAM) 50 MG TABLET    TAKE 1 TABLET BY MOUTH 2 TO 3 TIMES DAILY AS NEEDED FOR PAIN.   TRIAMCINOLONE (KENALOG) 0.025 % OINTMENT    Apply 1 application topically 2 (two) times daily.  Modified Medications   Modified Medication Previous Medication   AMOXICILLIN-CLAVULANATE (AUGMENTIN) 875-125 MG TABLET amoxicillin-clavulanate (AUGMENTIN) 875-125 MG tablet      Take 1 tablet by mouth 2 (two) times daily for 28 days.    Take 1 tablet by mouth 2 (two) times daily.   DOXYCYCLINE (VIBRA-TABS) 100 MG TABLET doxycycline (VIBRA-TABS) 100 MG tablet      Take 1 tablet (100 mg total) by mouth 2 (two) times daily for 28 days.    Take 1 tablet (100 mg total) by mouth 2 (two) times daily.  Discontinued Medications   No medications on file      Past Medical History:  Diagnosis Date   Chest pain    Diabetes mellitus    Gout    Hyperlipidemia    Hypertension     Obesity     Social History  Tobacco Use   Smoking status: Never   Smokeless tobacco: Current    Types: Chew  Vaping Use   Vaping Use: Never used  Substance Use Topics   Alcohol use: Yes    Alcohol/week: 0.0 standard drinks    Comment: occ   Drug use: No    Family History  Problem Relation Age of Onset   Diabetes Maternal Grandfather    Kidney disease Other     No Known Allergies  Review of Systems  All other systems reviewed and are negative. Except as noted above.   OBJECTIVE:    Vitals:   06/18/21 1102  BP: 117/76  Pulse: 78  Temp: 97.6 F (36.4 C)  TempSrc: Oral  SpO2: 99%  Weight: 262 lb (118.8 kg)   Body mass index is 36.54 kg/m.  Physical Exam Constitutional:      Appearance: Normal appearance.  HENT:     Head: Normocephalic and atraumatic.  Pulmonary:     Effort: Pulmonary effort is normal. No respiratory distress.  Musculoskeletal:     Comments: Wearing DM shoes.   Skin:    General: Skin is warm and dry.     Findings: No rash.  Neurological:     General: No focal deficit present.     Mental Status: He is alert and oriented to person, place, and time.  Psychiatric:        Mood and Affect: Mood normal.        Behavior: Behavior normal.     Labs and Microbiology: CBC Latest Ref Rng & Units 05/19/2021 04/22/2021 01/25/2021  WBC 3.8 - 10.8 Thousand/uL 8.7 9.9 11.4(H)  Hemoglobin 13.2 - 17.1 g/dL 15.0 14.7 12.9(L)  Hematocrit 38.5 - 50.0 % 44.5 45.5 40.1  Platelets 140 - 400 Thousand/uL 277 334 282   CMP Latest Ref Rng & Units 05/19/2021 04/22/2021 01/24/2021  Glucose 65 - 99 mg/dL 167(H) 282(H) 97  BUN 7 - 25 mg/dL 14 16 14   Creatinine 0.70 - 1.30 mg/dL 0.85 1.01 0.78  Sodium 135 - 146 mmol/L 138 135 136  Potassium 3.5 - 5.3 mmol/L 4.4 4.7 3.8  Chloride 98 - 110 mmol/L 103 99 104  CO2 20 - 32 mmol/L 27 22 25   Calcium 8.6 - 10.3 mg/dL 9.8 9.5 9.3  Total Protein 6.5 - 8.1 g/dL - - -  Total Bilirubin 0.3 - 1.2 mg/dL - - -  Alkaline  Phos 38 - 126 U/L - - -  AST 15 - 41 U/L - - -  ALT 0 - 44 U/L - - -        ASSESSMENT & PLAN:    Open wound of toe I am still concerned about the ability for his wounds to heal due to his continued need to work and amount of time and pressure he is putting on his toes.  I think he may be at risk for future amputation but am hopeful to avoid this as he obviously is as well.  At this point I am not sure further antibiotics will yield much in terms of benefit as he just completed 6 weeks of therapy with doxycycline and augmentin.  However, he reports improvement since getting fitted for new DM shoes and recent X-rays show chronic osteo but no acute findings.  Fortunately, his inflammatory markers were normal last month.  Given attempt at limb salvage, lack of surgical intervention, and new wound noted by his podiatrist will extend antibiotics an additional 4 weeks and check labs today.  Further extension beyond these next 4 weeks is unlikely to be of any benefit for patient as he will have completed 10 weeks of antibiotics at that point.     Orders Placed This Encounter  Procedures   Basic metabolic panel    Order Specific Question:   Has the patient fasted?    Answer:   No   CBC   Sedimentation rate   C-reactive protein       Raynelle [facility] 06/18/2021, 11:29 AM  I spent 30 minutes dedicated to the care of this patient on the date of this encounter to include pre-visit review of records, face-to-face time with the patient discussing wound and osteo, and post-visit ordering of testing.

## 2021-06-18 ENCOUNTER — Other Ambulatory Visit: Payer: Self-pay

## 2021-06-18 ENCOUNTER — Ambulatory Visit (INDEPENDENT_AMBULATORY_CARE_PROVIDER_SITE_OTHER): Payer: 59 | Admitting: Internal Medicine

## 2021-06-18 ENCOUNTER — Other Ambulatory Visit (HOSPITAL_COMMUNITY): Payer: Self-pay

## 2021-06-18 ENCOUNTER — Encounter: Payer: Self-pay | Admitting: Internal Medicine

## 2021-06-18 VITALS — BP 117/76 | HR 78 | Temp 97.6°F | Wt 262.0 lb

## 2021-06-18 DIAGNOSIS — S91109A Unspecified open wound of unspecified toe(s) without damage to nail, initial encounter: Secondary | ICD-10-CM

## 2021-06-18 MED ORDER — DOXYCYCLINE HYCLATE 100 MG PO TABS
100.0000 mg | ORAL_TABLET | Freq: Two times a day (BID) | ORAL | 1 refills | Status: AC
  Filled 2021-06-18: qty 56, 28d supply, fill #0

## 2021-06-18 MED ORDER — DOXYCYCLINE HYCLATE 100 MG PO TABS
100.0000 mg | ORAL_TABLET | Freq: Two times a day (BID) | ORAL | 0 refills | Status: DC
  Filled 2021-06-18: qty 56, 28d supply, fill #0

## 2021-06-18 MED ORDER — AMOXICILLIN-POT CLAVULANATE 875-125 MG PO TABS
1.0000 | ORAL_TABLET | Freq: Two times a day (BID) | ORAL | 0 refills | Status: DC
  Filled 2021-06-18: qty 56, 28d supply, fill #0

## 2021-06-18 MED ORDER — AMOXICILLIN-POT CLAVULANATE 875-125 MG PO TABS
1.0000 | ORAL_TABLET | Freq: Two times a day (BID) | ORAL | 1 refills | Status: AC
  Filled 2021-06-18: qty 56, 28d supply, fill #0

## 2021-06-18 NOTE — Addendum Note (Signed)
Addended by: Juanita Laster on: 06/18/2021 11:39 AM   Modules accepted: Orders

## 2021-06-18 NOTE — Assessment & Plan Note (Signed)
I am still concerned about the ability for his wounds to heal due to his continued need to work and amount of time and pressure he is putting on his toes.  I think he may be at risk for future amputation but am hopeful to avoid this as he obviously is as well.  At this point I am not sure further antibiotics will yield much in terms of benefit as he just completed 6 weeks of therapy with doxycycline and augmentin.  However, he reports improvement since getting fitted for new DM shoes and recent X-rays show chronic osteo but no acute findings.  Fortunately, his inflammatory markers were normal last month.  Given attempt at limb salvage, lack of surgical intervention, and new wound noted by his podiatrist will extend antibiotics an additional 4 weeks and check labs today.  Further extension beyond these next 4 weeks is unlikely to be of any benefit for patient as he will have completed 10 weeks of antibiotics at that point.

## 2021-06-19 LAB — CBC
HCT: 46 % (ref 38.5–50.0)
Hemoglobin: 15.5 g/dL (ref 13.2–17.1)
MCH: 28 pg (ref 27.0–33.0)
MCHC: 33.7 g/dL (ref 32.0–36.0)
MCV: 83 fL (ref 80.0–100.0)
MPV: 11.4 fL (ref 7.5–12.5)
Platelets: 294 10*3/uL (ref 140–400)
RBC: 5.54 10*6/uL (ref 4.20–5.80)
RDW: 13.2 % (ref 11.0–15.0)
WBC: 11.5 10*3/uL — ABNORMAL HIGH (ref 3.8–10.8)

## 2021-06-19 LAB — BASIC METABOLIC PANEL
BUN: 19 mg/dL (ref 7–25)
CO2: 27 mmol/L (ref 20–32)
Calcium: 10.1 mg/dL (ref 8.6–10.3)
Chloride: 104 mmol/L (ref 98–110)
Creat: 0.88 mg/dL (ref 0.70–1.30)
Glucose, Bld: 93 mg/dL (ref 65–99)
Potassium: 4.7 mmol/L (ref 3.5–5.3)
Sodium: 139 mmol/L (ref 135–146)

## 2021-06-19 LAB — SEDIMENTATION RATE: Sed Rate: 19 mm/h (ref 0–20)

## 2021-06-19 LAB — C-REACTIVE PROTEIN: CRP: 5.7 mg/L (ref <8.0)

## 2021-06-21 ENCOUNTER — Telehealth: Payer: Self-pay

## 2021-06-21 NOTE — Telephone Encounter (Signed)
-----   Message from Kathlynn Grate, DO sent at 06/21/2021  1:05 PM EST ----- Please let pt know that inflammatory markers are still normalized from labs on 1/13.  Please continue antibiotics for now.

## 2021-06-21 NOTE — Telephone Encounter (Signed)
Patient aware of results still normalized from labs on 01/13 and to continue antibiotics as prescribed for now. Patient verbalized his understanding.     Claryce Friel Lesli Albee, CMA

## 2021-06-29 ENCOUNTER — Ambulatory Visit (INDEPENDENT_AMBULATORY_CARE_PROVIDER_SITE_OTHER): Payer: 59 | Admitting: Podiatry

## 2021-06-29 ENCOUNTER — Other Ambulatory Visit: Payer: Self-pay

## 2021-06-29 DIAGNOSIS — L97512 Non-pressure chronic ulcer of other part of right foot with fat layer exposed: Secondary | ICD-10-CM | POA: Diagnosis not present

## 2021-06-29 DIAGNOSIS — L84 Corns and callosities: Secondary | ICD-10-CM | POA: Diagnosis not present

## 2021-06-29 DIAGNOSIS — E08621 Diabetes mellitus due to underlying condition with foot ulcer: Secondary | ICD-10-CM | POA: Diagnosis not present

## 2021-07-02 ENCOUNTER — Other Ambulatory Visit (HOSPITAL_COMMUNITY): Payer: Self-pay

## 2021-07-05 ENCOUNTER — Other Ambulatory Visit (HOSPITAL_COMMUNITY): Payer: Self-pay

## 2021-07-05 NOTE — Progress Notes (Signed)
Subjective: Martin [name] is a 56 y.o. is seen today in office today for follow-up evaluation for wound on the right second toe and also his right third toe.  See still antibiotics he states he has made significant improvement.  Also diabetic shoes above but been helpful.  He denies any fevers or chills.  No chest pain, shortness of breath or any other concerns today.   Unfortunately he has continued to work and he has to continue to work.  He works at [employer] carrying heavy boxes which is putting quite a bit of pressure on his feet.   Objective: General: No acute distress, AAOx3-wife present DP/PT pulses palpable 2/4, CRT < 3 sec to all digits.  Right foot: On the previous hallux amputation.  This is well-healed.  The distal aspect of the right second toe there is transverse plane deformity noted to the toe and small superficial clean the wound is present the dorsal aspect of the distal portion of the toe.  There is no probing, undermining or tunneling.  The distal aspect of the right third toe is a hyperkeratotic lesion.  Upon debridement appears the wound is healed but is preulcerative.  There is still some edema present there is no significant cellulitis.  There is no fluctuation or crepitation but there is no malodor. Left foot: Hyperkeratotic tissue present with dried blood present the distal aspect of the left first and second toes but appears to be much improved compared to previous.  No edema, erythema or signs of infection. Hammertoes present. No pain with calf compression, swelling, warmth, erythema.   Assessment and Plan:  Status post right partial first ray amputation, improving; right second toe infection; pre-ulcerative left first and second toes-much improved  -Treatment options discussed including all alternatives, risks, and complications -Overall doing much better today.  Sharp debridement hyperkeratotic tissue on the right third toe without any complications or bleeding.   Discussed with him flexor tenotomy if needed but would hold off on this today as he is doing better with the offloading diabetic shoes.  Continue antibiotics.  Follow-up with infectious disease as scheduled.  He needs to keep a very close monitoring the toes of his any increase signs or symptoms of infection report to the emergency department.  Vivi Barrack DPM  -Repeat x-rays revealed chronic osteomyelitis of the right second distal phalanx.  No other evidence of acute osteomyelitis or soft tissue emphysema. -I am very concerned about his feet.  Discussed this again with him today.  The patient is frustrated with the wounds on his feet.  I have tried different offloading techniques.  We have tried different pads and cushions.  Ultimately dispensed diabetic shoes today but discussed with him he is to break them and slip for wearing them at work and he was seen today by our orthotist, Arlys John for this.  Unfortunately I think his job has a lot to do with his issues and he is on his feet several hours a day wearing shoes on hard surfaces pushing and carrying heavy boxes.  I think that he needs to apply for disability or get a job that sits. -Debrided the preulcerative lesion on the left first and second toes have any complications.  Continue offloading. -Monitor for any clinical signs or symptoms of infection and directed to call the office immediately should any occur or go to the ER.  Vivi Barrack DPM

## 2021-07-09 DIAGNOSIS — M25562 Pain in left knee: Secondary | ICD-10-CM | POA: Diagnosis not present

## 2021-07-19 ENCOUNTER — Other Ambulatory Visit (HOSPITAL_COMMUNITY): Payer: Self-pay

## 2021-07-20 ENCOUNTER — Other Ambulatory Visit (HOSPITAL_COMMUNITY): Payer: Self-pay

## 2021-07-22 ENCOUNTER — Other Ambulatory Visit (HOSPITAL_COMMUNITY): Payer: Self-pay

## 2021-07-26 ENCOUNTER — Other Ambulatory Visit (HOSPITAL_COMMUNITY): Payer: Self-pay

## 2021-07-27 ENCOUNTER — Other Ambulatory Visit (HOSPITAL_COMMUNITY): Payer: Self-pay

## 2021-07-27 MED ORDER — MELOXICAM 15 MG PO TABS
15.0000 mg | ORAL_TABLET | Freq: Every day | ORAL | 1 refills | Status: DC
  Filled 2021-07-27: qty 30, 30d supply, fill #0
  Filled 2021-08-19: qty 30, 30d supply, fill #1

## 2021-07-30 ENCOUNTER — Ambulatory Visit: Payer: 59 | Admitting: Podiatry

## 2021-08-04 ENCOUNTER — Ambulatory Visit: Payer: 59 | Admitting: Internal Medicine

## 2021-08-04 ENCOUNTER — Telehealth: Payer: Self-pay

## 2021-08-04 NOTE — Telephone Encounter (Signed)
Error

## 2021-08-04 NOTE — Telephone Encounter (Signed)
Called to reschedule patient's missed appointment today. No answer. Left HIPAA-compliant message on patient's identified voicemail requesting call back. ? ?Wyvonne Lenz, RN  ?

## 2021-08-04 NOTE — Progress Notes (Deleted)
?  ? ? ? ? ?[facility] ? ?CHIEF COMPLAINT:   ? ?Follow up for osteomyelitis ? ?SUBJECTIVE:   ? ?Martin [name] is a 56 y.o. male with PMHx as below who presents to the clinic for osteomyelitis.  ? ?Patient is here today for planned 6 week follow up. He was seen on 11/28, 12/14, and 06/18/21 due to concern for OM of the right 2nd toe and has been following with podiatry (Dr Jacqualyn Posey) whom he saw as well on 05/03/21, 05/19/21, 06/14/21 and 06/29/21. ?  ?Patient has a history of right partial first ray amputation due to OM with well-healing incision on 01/23/2021 with Dr. Jacqualyn Posey of podiatry.  ? ?He has continued to have a wound with localized edema and erythema to the distal portion of the 2nd toe without any exposure of bone or tendon that developed after a bite from his dog several weeks ago.  X-rays previously had not shown osseous erosion, however, the wound is in close proximity to the bone which raises the concern for contiguous osteomyelitis.  He has been on various oral antibiotics prior to being referred to our clinic. ?  ?At visit on 11/28 he was placed on doxycycline and Augmentin (superficial wound cx = E faecalis) for a planned 6 week course which he has completed 06/17/21.   ?  ?His CRP on 11/17 was 4 and ESR 39.  His A1c in August is 11.1.  At follow up on 12/14 his ESR and CRP were both normalized.  At 1/9 podiatry visit he was noted to have a new wound to distal aspect of right 3rd toe.   ? ?Unfortunately he has continued to work at [employer] out of necessity which requires him to be on his feet for several hours, pushing boxes, and placing a lot of pressure on his toes.  His podiatrist has expressed his concern that this is contributing to ongoing issues.  Repeat x rays done at his podiatry office revealed chronic osteomyelitis of the right second distal phalanx.  No other evidence of acute osteomyelitis or soft tissue emphysema.  He was placed in DM shoes at that time and  reports a lot of improvement since that time.   ? ?He saw Dr Jacqualyn Posey on 1/24.  He reported significant improvement which he reports today as well.  No fevers, chills.  Tolerated antibiotics without issues which he completed ***.   ? ?Please see A&P for the details of today's visit and status of the patient's medical problems.  ? ?Patient's Medications  ?New Prescriptions  ? No medications on file  ?Previous Medications  ? ATORVASTATIN (LIPITOR) 80 MG TABLET    TAKE 1 TABLET BY MOUTH ONCE A DAY AT BEDTIME  ? ATORVASTATIN (LIPITOR) 80 MG TABLET    Take 1 tablet (80 mg total) by mouth at bedtime.  ? BLOOD GLUCOSE MONITORING SUPPL (BLOOD GLUCOSE MONITOR SYSTEM) W/DEVICE KIT    Use up to four times daily as directed.  ? COVID-19 AT HOME ANTIGEN TEST (CARESTART COVID-19 HOME TEST) KIT    Use as directed  ? CYCLOBENZAPRINE (FLEXERIL) 5 MG TABLET    TAKE 1 TABLET BY MOUTH EVERY 12 HOURS AS NEEDED FOR MUSCLE SPASMS AND PAIN.  ? DULAGLUTIDE (TRULICITY) 8.18 EX/9.3ZJ SOPN    Inject 0.75 mg into the skin once a week.  ? DULAGLUTIDE (TRULICITY) 1.5 IR/6.7EL SOPN    Inject 1.5 mg into the skin once a week.  ? EMPAGLIFLOZIN (JARDIANCE) 25 MG  TABS TABLET    Take 1 tablet (25 mg total) by mouth daily.  ? EMPAGLIFLOZIN (JARDIANCE) 25 MG TABS TABLET    Take 1 tablet (25 mg total) by mouth daily.  ? FUROSEMIDE (LASIX) 20 MG TABLET    Take 1 tablet (20 mg total) by mouth daily as needed.  ? GABAPENTIN (NEURONTIN) 300 MG CAPSULE    Take 1 capsule (300 mg total) by mouth 3 (three) times daily as needed.  ? GENTAMICIN CREAM (GARAMYCIN) 0.1 %    Apply 1 application topically 3 (three) times daily.  ? GLUCOSE BLOOD (FREESTYLE LITE) TEST STRIP    Use three times daily as directed  ? INSULIN DEGLUDEC (TRESIBA FLEXTOUCH) 200 UNIT/ML FLEXTOUCH PEN    Inject 110 Units into the skin daily.  ? INSULIN LISPRO (HUMALOG KWIKPEN) 100 UNIT/ML KWIKPEN    Inject 4 Units into the skin daily at the beginning of the largest meal for the day. Expires 28 days  after initial use  ? INSULIN PEN NEEDLE 31G X 6 MM MISC    USE TO INJECT INSULIN TWICE A DAY  ? LANCETS (FREESTYLE) LANCETS    Use three times daily as directed  ? LISINOPRIL (ZESTRIL) 10 MG TABLET    TAKE 1 TABLET BY MOUTH ONCE DAILY.  ? LISINOPRIL (ZESTRIL) 10 MG TABLET    Take 1 tablet by mouth daily.  ? MELOXICAM (MOBIC) 15 MG TABLET    Take 1 tablet by mouth daily.  ? MELOXICAM (MOBIC) 15 MG TABLET    Take 1 tablet (15 mg total) by mouth daily.  ? MELOXICAM (MOBIC) 15 MG TABLET    Take 1 tablet (15 mg total) by mouth daily.  ? MELOXICAM (MOBIC) 15 MG TABLET    Take 1 tablet (15 mg total) by mouth daily.  ? METFORMIN (GLUCOPHAGE) 1000 MG TABLET    Take by mouth.  ? METFORMIN (GLUCOPHAGE-XR) 500 MG 24 HR TABLET    TAKE 2 TABLETS BY MOUTH WITH A MEAL TWICE A DAY.  ? MUPIROCIN OINTMENT (BACTROBAN) 2 %    Apply 1 application topically 2 (two) times daily.  ? ONETOUCH VERIO TEST STRIP    USE AS INSTRUCTED 2-3 X A DAY.  ? OXYCODONE (OXY IR/ROXICODONE) 5 MG IMMEDIATE RELEASE TABLET    Take 1 tablet (5 mg total) by mouth every 4 (four) hours as needed.  ? SILDENAFIL (VIAGRA) 100 MG TABLET    Take 1 tablet (100 mg total) by mouth up to once daily as needed  ? SILDENAFIL (VIAGRA) 100 MG TABLET    Take 1/2 to one tablet one hour before sex for E.D.  ? TRAMADOL (ULTRAM) 50 MG TABLET    TAKE 1 TABLET BY MOUTH 2 TO 3 TIMES DAILY AS NEEDED FOR PAIN.  ? TRIAMCINOLONE (KENALOG) 0.025 % OINTMENT    Apply 1 application topically 2 (two) times daily.  ?Modified Medications  ? No medications on file  ?Discontinued Medications  ? No medications on file  ?   ? ?Past Medical History:  ?Diagnosis Date  ? Chest pain   ? Diabetes mellitus   ? Gout   ? Hyperlipidemia   ? Hypertension   ? Obesity   ? ? ?Social History  ? ?Tobacco Use  ? Smoking status: Never  ? Smokeless tobacco: Current  ?  Types: Chew  ?Vaping Use  ? Vaping Use: Never used  ?Substance Use Topics  ? Alcohol use: Yes  ?  Alcohol/week: 0.0 standard drinks  ?  Comment: occ  ?  Drug use: No  ? ? ?Family History  ?Problem Relation Age of Onset  ? Diabetes Maternal Grandfather   ? Kidney disease Other   ? ? ?No Known Allergies ? ?ROS ? ? ?OBJECTIVE:   ? ?There were no vitals filed for this visit. ?There is no height or weight on file to calculate BMI. ? ?Physical Exam ? ? ?Labs and Microbiology: ?CBC Latest Ref Rng & Units 06/18/2021 05/19/2021 04/22/2021  ?WBC 3.8 - 10.8 Thousand/uL 11.5(H) 8.7 9.9  ?Hemoglobin 13.2 - 17.1 g/dL 15.5 15.0 14.7  ?Hematocrit 38.5 - 50.0 % 46.0 44.5 45.5  ?Platelets 140 - 400 Thousand/uL 294 277 334  ? ?CMP Latest Ref Rng & Units 06/18/2021 05/19/2021 04/22/2021  ?Glucose 65 - 99 mg/dL 93 167(H) 282(H)  ?BUN 7 - 25 mg/dL 19 14 16   ?Creatinine 0.70 - 1.30 mg/dL 0.88 0.85 1.01  ?Sodium 135 - 146 mmol/L 139 138 135  ?Potassium 3.5 - 5.3 mmol/L 4.7 4.4 4.7  ?Chloride 98 - 110 mmol/L 104 103 99  ?CO2 20 - 32 mmol/L 27 27 22   ?Calcium 8.6 - 10.3 mg/dL 10.1 9.8 9.5  ?Total Protein 6.5 - 8.1 g/dL - - -  ?Total Bilirubin 0.3 - 1.2 mg/dL - - -  ?Alkaline Phos 38 - 126 U/L - - -  ?AST 15 - 41 U/L - - -  ?ALT 0 - 44 U/L - - -  ?  ? ? ? ?ASSESSMENT & PLAN:   ? ?No problem-specific Assessment & Plan notes found for this encounter. ? ? ?No orders of the defined types were placed in this encounter. ?  ? ?There are no diagnoses linked to this encounter. ? ?Patient has completed 10 weeks of antibiotics due to this issue.  His antibiotics were extended last visit due to significant improvement since getting his DM shoes and a new ulcer noted on his 3rd toe by podiatry.  At this time, he appears stable and discussed with patient that continued antibiotics likely will not be of benefit.  Recommend continued wound care and offloading.  Follow up as needed.  If wounds were to progress, would likely need to consider more proximal amputation.   ? ? ?Mignon Pine ?[facility] ?[facility] ?08/04/2021, 5:55 AM ? ? ? ?

## 2021-08-13 ENCOUNTER — Other Ambulatory Visit (HOSPITAL_COMMUNITY): Payer: Self-pay

## 2021-08-19 ENCOUNTER — Other Ambulatory Visit (HOSPITAL_COMMUNITY): Payer: Self-pay

## 2021-08-20 ENCOUNTER — Other Ambulatory Visit (HOSPITAL_COMMUNITY): Payer: Self-pay

## 2021-08-30 ENCOUNTER — Other Ambulatory Visit (HOSPITAL_COMMUNITY): Payer: Self-pay

## 2021-08-30 DIAGNOSIS — Z20822 Contact with and (suspected) exposure to covid-19: Secondary | ICD-10-CM | POA: Diagnosis not present

## 2021-08-30 DIAGNOSIS — J019 Acute sinusitis, unspecified: Secondary | ICD-10-CM | POA: Diagnosis not present

## 2021-08-30 MED ORDER — DOXYCYCLINE HYCLATE 100 MG PO CAPS
100.0000 mg | ORAL_CAPSULE | Freq: Two times a day (BID) | ORAL | 0 refills | Status: AC
  Filled 2021-08-30: qty 14, 7d supply, fill #0

## 2021-08-31 ENCOUNTER — Other Ambulatory Visit (HOSPITAL_COMMUNITY): Payer: Self-pay

## 2021-09-01 ENCOUNTER — Other Ambulatory Visit (HOSPITAL_COMMUNITY): Payer: Self-pay

## 2021-09-02 ENCOUNTER — Other Ambulatory Visit (HOSPITAL_COMMUNITY): Payer: Self-pay

## 2021-09-02 MED ORDER — METFORMIN HCL ER 500 MG PO TB24
1000.0000 mg | ORAL_TABLET | Freq: Two times a day (BID) | ORAL | 0 refills | Status: DC
  Filled 2021-09-02: qty 180, 45d supply, fill #0

## 2021-09-02 MED ORDER — LISINOPRIL 10 MG PO TABS
10.0000 mg | ORAL_TABLET | Freq: Every day | ORAL | 0 refills | Status: DC
  Filled 2021-09-02: qty 90, 90d supply, fill #0

## 2021-09-03 ENCOUNTER — Other Ambulatory Visit (HOSPITAL_COMMUNITY): Payer: Self-pay

## 2021-09-20 ENCOUNTER — Other Ambulatory Visit (HOSPITAL_COMMUNITY): Payer: Self-pay

## 2021-09-21 ENCOUNTER — Other Ambulatory Visit (HOSPITAL_COMMUNITY): Payer: Self-pay

## 2021-09-21 MED ORDER — MELOXICAM 15 MG PO TABS
15.0000 mg | ORAL_TABLET | Freq: Every day | ORAL | 1 refills | Status: DC
  Filled 2021-09-21: qty 30, 30d supply, fill #0
  Filled 2021-10-19: qty 30, 30d supply, fill #1

## 2021-09-24 ENCOUNTER — Other Ambulatory Visit (HOSPITAL_COMMUNITY): Payer: Self-pay

## 2021-10-08 ENCOUNTER — Other Ambulatory Visit (HOSPITAL_COMMUNITY): Payer: Self-pay

## 2021-10-11 ENCOUNTER — Other Ambulatory Visit (HOSPITAL_COMMUNITY): Payer: Self-pay

## 2021-10-19 ENCOUNTER — Other Ambulatory Visit (HOSPITAL_COMMUNITY): Payer: Self-pay

## 2021-11-08 ENCOUNTER — Other Ambulatory Visit (HOSPITAL_COMMUNITY): Payer: Self-pay

## 2021-11-08 ENCOUNTER — Other Ambulatory Visit: Payer: Self-pay

## 2021-11-08 ENCOUNTER — Other Ambulatory Visit (HOSPITAL_BASED_OUTPATIENT_CLINIC_OR_DEPARTMENT_OTHER): Payer: Self-pay

## 2021-11-08 ENCOUNTER — Emergency Department (HOSPITAL_BASED_OUTPATIENT_CLINIC_OR_DEPARTMENT_OTHER)
Admission: EM | Admit: 2021-11-08 | Discharge: 2021-11-08 | Disposition: A | Discharge status: Home / Self Care | Payer: 59 | Attending: Emergency Medicine | Admitting: Emergency Medicine

## 2021-11-08 ENCOUNTER — Encounter (HOSPITAL_BASED_OUTPATIENT_CLINIC_OR_DEPARTMENT_OTHER): Payer: Self-pay

## 2021-11-08 ENCOUNTER — Emergency Department (HOSPITAL_BASED_OUTPATIENT_CLINIC_OR_DEPARTMENT_OTHER): Payer: 59

## 2021-11-08 DIAGNOSIS — E119 Type 2 diabetes mellitus without complications: Secondary | ICD-10-CM | POA: Insufficient documentation

## 2021-11-08 DIAGNOSIS — Z794 Long term (current) use of insulin: Secondary | ICD-10-CM | POA: Diagnosis not present

## 2021-11-08 DIAGNOSIS — Z79899 Other long term (current) drug therapy: Secondary | ICD-10-CM | POA: Diagnosis not present

## 2021-11-08 DIAGNOSIS — R109 Unspecified abdominal pain: Secondary | ICD-10-CM | POA: Diagnosis not present

## 2021-11-08 DIAGNOSIS — M545 Low back pain, unspecified: Secondary | ICD-10-CM | POA: Diagnosis not present

## 2021-11-08 DIAGNOSIS — Z7984 Long term (current) use of oral hypoglycemic drugs: Secondary | ICD-10-CM | POA: Insufficient documentation

## 2021-11-08 DIAGNOSIS — M549 Dorsalgia, unspecified: Secondary | ICD-10-CM

## 2021-11-08 DIAGNOSIS — N400 Enlarged prostate without lower urinary tract symptoms: Secondary | ICD-10-CM | POA: Diagnosis not present

## 2021-11-08 DIAGNOSIS — I10 Essential (primary) hypertension: Secondary | ICD-10-CM | POA: Insufficient documentation

## 2021-11-08 LAB — URINALYSIS, ROUTINE W REFLEX MICROSCOPIC
Bilirubin Urine: NEGATIVE
Glucose, UA: >1000 mg/dL — AB
Hgb urine dipstick: NEGATIVE
Ketones, ur: NEGATIVE mg/dL
Nitrite: NEGATIVE
Protein, ur: NEGATIVE mg/dL
Specific Gravity, Urine: 1.025 (ref 1.005–1.030)
pH: 5.5 (ref 5.0–8.0)

## 2021-11-08 MED ORDER — ATORVASTATIN CALCIUM 80 MG PO TABS
80.0000 mg | ORAL_TABLET | Freq: Every day | ORAL | 3 refills | Status: DC
  Filled 2021-11-08: qty 90, 90d supply, fill #0
  Filled 2022-04-27: qty 90, 90d supply, fill #1
  Filled 2022-10-25: qty 90, 90d supply, fill #2

## 2021-11-08 MED ORDER — METFORMIN HCL ER 500 MG PO TB24
1000.0000 mg | ORAL_TABLET | Freq: Two times a day (BID) | ORAL | 3 refills | Status: DC
  Filled 2021-11-08: qty 360, 90d supply, fill #0
  Filled 2022-03-28: qty 360, 90d supply, fill #1

## 2021-11-08 MED ORDER — TRAMADOL HCL 50 MG PO TABS
50.0000 mg | ORAL_TABLET | Freq: Four times a day (QID) | ORAL | 0 refills | Status: AC | PRN
  Filled 2021-11-08: qty 20, 5d supply, fill #0

## 2021-11-08 MED ORDER — CYCLOBENZAPRINE HCL 10 MG PO TABS
10.0000 mg | ORAL_TABLET | Freq: Two times a day (BID) | ORAL | 0 refills | Status: DC | PRN
  Filled 2021-11-08: qty 20, 10d supply, fill #0

## 2021-11-08 MED ORDER — LIDOCAINE 5 % EX PTCH
1.0000 | MEDICATED_PATCH | CUTANEOUS | 0 refills | Status: DC
  Filled 2021-11-08: qty 25, 25d supply, fill #0

## 2021-11-08 NOTE — ED Notes (Signed)
Patient transported to CT 

## 2021-11-08 NOTE — ED Triage Notes (Signed)
Pt presents POV with intermittent Right flank pain x1 week. Pt states, "not sure if its a pulled muscle or a kidney stone."  Pt denies any injury or any urinary concerns. States it's been 4-5 years since his last kidney stone, pt denies any radiating pain.

## 2021-11-08 NOTE — Discharge Instructions (Addendum)
You have been seen and discharged from the emergency department.  Your CT imaging was normal.  You are most likely suffering from musculoskeletal strain/pain.  Take medications as prescribed.  Take stronger pain medicine as needed.  Do not mix this medication with alcohol or other sedating medications. Do not drive or do heavy physical activity until you know how this medication affects you.  It may cause drowsiness.  Follow-up with your primary provider for further evaluation and further care. Take home medications as prescribed. If you have any worsening symptoms or further concerns for your health please return to an emergency department for further evaluation.

## 2021-11-08 NOTE — ED Provider Notes (Signed)
With [place] EMERGENCY DEPT Provider Note   CSN: [id number] Arrival date & time: 11/08/21  1224     History  Chief Complaint  Patient presents with   Back Pain   Flank Pain    Martin [name] is a 56 y.o. male.  HPI  56 year old male past medical history of HTN, HLD, obesity, DM, kidney stones presents emergency department right-sided flank pain.  Patient states that this has been going on for the past week but persistent and worse since last night.  He does do heavy lifting and twisting for labor.  Is unsure if this is a pulled muscle however does feel similar to previous kidney stones.  He denies any dysuria, hematuria, frequency.  No fever, nausea/vomiting or diarrhea.  Pain does not originate in the lower back, no radiation through the buttocks or down the leg.  No weakness of the lower extremities.  Home Medications Prior to Admission medications   Medication Sig Start Date End Date Taking? Authorizing Provider  atorvastatin (LIPITOR) 80 MG tablet TAKE 1 TABLET BY MOUTH ONCE A DAY AT BEDTIME 06/04/20 06/04/21  Delrae Rend, MD  atorvastatin (LIPITOR) 80 MG tablet Take 1 tablet (80 mg total) by mouth at bedtime. 11/08/21     Blood Glucose Monitoring Suppl (BLOOD GLUCOSE MONITOR SYSTEM) w/Device KIT Use up to four times daily as directed. 01/25/21   Darliss Cheney, MD  COVID-19 At Home Antigen Test [facility] COVID-19 HOME TEST) KIT Use as directed Patient not taking: Reported on 06/18/2021 05/05/21   Jefm Bryant, RPH  cyclobenzaprine (FLEXERIL) 5 MG tablet TAKE 1 TABLET BY MOUTH EVERY 12 HOURS AS NEEDED FOR MUSCLE SPASMS AND PAIN. 06/01/20   [provider]  Dulaglutide (TRULICITY) 3.74 MO/7.0BE SOPN Inject 0.75 mg into the skin once a week. 01/28/21     Dulaglutide (TRULICITY) 1.5 ML/5.4GB SOPN Inject 1.5 mg into the skin once a week. 04/27/21     empagliflozin (JARDIANCE) 25 MG TABS tablet Take 1 tablet (25 mg total) by mouth daily. 09/17/20      empagliflozin (JARDIANCE) 25 MG TABS tablet Take 1 tablet (25 mg total) by mouth daily. 01/28/21     furosemide (LASIX) 20 MG tablet Take 1 tablet (20 mg total) by mouth daily as needed. 01/29/21     gabapentin (NEURONTIN) 300 MG capsule Take 1 capsule (300 mg total) by mouth 3 (three) times daily as needed. 05/17/20   Hayden Rasmussen, MD  gentamicin cream (GARAMYCIN) 0.1 % Apply 1 application topically 3 (three) times daily. 12/14/20   Edrick Kins, DPM  glucose blood (FREESTYLE LITE) test strip Use three times daily as directed 03/09/21     insulin degludec (TRESIBA FLEXTOUCH) 200 UNIT/ML FlexTouch Pen Inject 110 Units into the skin daily. 05/17/21     insulin lispro (HUMALOG KWIKPEN) 100 UNIT/ML KwikPen Inject 4 Units into the skin daily at the beginning of the largest meal for the day. Expires 28 days after initial use 04/27/21     Lancets (FREESTYLE) lancets Use three times daily as directed 03/09/21   Delrae Rend, MD  lisinopril (ZESTRIL) 10 MG tablet TAKE 1 TABLET BY MOUTH ONCE DAILY. 08/21/20 08/21/21  Delrae Rend, MD  lisinopril (ZESTRIL) 10 MG tablet Take 1 tablet by mouth daily. 03/16/21   [provider]  lisinopril (ZESTRIL) 10 MG tablet Take 1 tablet (10 mg total) by mouth daily. 09/02/21     meloxicam (MOBIC) 15 MG tablet Take 1 tablet by mouth daily. 01/29/21  [provider]  meloxicam (MOBIC) 15 MG tablet Take 1 tablet (15 mg total) by mouth daily. 02/15/21     meloxicam (MOBIC) 15 MG tablet Take 1 tablet (15 mg total) by mouth daily. 05/19/21     meloxicam (MOBIC) 15 MG tablet Take 1 tablet (15 mg total) by mouth daily. 09/21/21     metFORMIN (GLUCOPHAGE) 1000 MG tablet Take by mouth. 03/16/21   [provider]  metFORMIN (GLUCOPHAGE-XR) 500 MG 24 hr tablet TAKE 2 TABLETS BY MOUTH WITH A MEAL TWICE A DAY. 08/28/20 08/28/21  Delrae Rend, MD  metFORMIN (GLUCOPHAGE-XR) 500 MG 24 hr tablet Take 2 tablets (1,000 mg total) by mouth 2 (two) times daily with a  meal. 11/08/21     mupirocin ointment (BACTROBAN) 2 % Apply 1 application topically 2 (two) times daily. 06/15/21   Trula Slade, DPM  ONETOUCH VERIO test strip USE AS INSTRUCTED 2-3 X A DAY. 08/07/13   Philemon Kingdom, MD  oxyCODONE (OXY IR/ROXICODONE) 5 MG immediate release tablet Take 1 tablet (5 mg total) by mouth every 4 (four) hours as needed. 01/25/21   Darliss Cheney, MD  sildenafil (VIAGRA) 100 MG tablet Take 1 tablet (100 mg total) by mouth up to once daily as needed 12/11/20     sildenafil (VIAGRA) 100 MG tablet Take 1/2 to one tablet one hour before sex for E.D. 03/16/21     traMADol (ULTRAM) 50 MG tablet TAKE 1 TABLET BY MOUTH 2 TO 3 TIMES DAILY AS NEEDED FOR PAIN. 06/01/20   [provider]  triamcinolone (KENALOG) 0.025 % ointment Apply 1 application topically 2 (two) times daily. 01/28/21   Trula Slade, DPM      Allergies    Patient has no known allergies.    Review of Systems   Review of Systems  Constitutional:  Negative for fever.  Respiratory:  Negative for shortness of breath.   Cardiovascular:  Negative for chest pain.  Gastrointestinal:  Negative for abdominal pain, diarrhea and vomiting.  Genitourinary:  Positive for flank pain. Negative for dysuria, frequency and hematuria.  Musculoskeletal:  Positive for back pain.  Skin:  Negative for rash.  Neurological:  Negative for headaches.   Physical Exam Updated Vital Signs BP (!) 168/89 (BP Location: Left Arm)   Pulse 69   Temp 98 F (36.7 C)   Resp 16   SpO2 100%  Physical Exam Vitals and nursing note reviewed.  Constitutional:      Appearance: Normal appearance.  HENT:     Head: Normocephalic.     Mouth/Throat:     Mouth: Mucous membranes are moist.  Cardiovascular:     Rate and Rhythm: Normal rate.  Pulmonary:     Effort: Pulmonary effort is normal. No respiratory distress.  Abdominal:     Palpations: Abdomen is soft.     Tenderness: There is no abdominal tenderness.  Musculoskeletal:      Comments: Right flank/lower back TTP, no overlying skin changes or rash, no midline TTP  Skin:    General: Skin is warm.  Neurological:     Mental Status: He is alert and oriented to person, place, and time. Mental status is at baseline.  Psychiatric:        Mood and Affect: Mood normal.    ED Results / Procedures / Treatments   Labs (all labs ordered are listed, but only abnormal results are displayed) Labs Reviewed  URINALYSIS, ROUTINE W REFLEX MICROSCOPIC - Abnormal; Notable for the following components:  Result Value   Glucose, UA >1,000 (*)    Leukocytes,Ua MODERATE (*)    All other components within normal limits    EKG None  Radiology No results found.  Procedures Procedures    Medications Ordered in ED Medications - No data to display  ED Course/ Medical Decision Making/ A&P                           Medical Decision Making Amount and/or Complexity of Data Reviewed Labs: ordered. Radiology: ordered.  Risk Prescription drug management.   56 year old male presents emergency department right low back pain.  History of kidney stones, no genitourinary symptoms.  Somewhat reproducible right lower back pain to palpation.  No overlying skin changes.  No fever.  No midline spinal pain or neurologic/red flag symptoms.  Urinalysis is reassuring, CT renal study shows no signs of kidney stone or other acute abnormality. No AAA.  Plan to treat as musculoskeletal pain/strain.  Patient is otherwise well-appearing.  Patient at this time appears safe and stable for discharge and close outpatient follow up. Discharge plan and strict return to ED precautions discussed, patient verbalizes understanding and agreement.        Final Clinical Impression(s) / ED Diagnoses Final diagnoses:  None    Rx / DC Orders ED Discharge Orders     None         Lorelle Gibbs, DO 11/08/21 1547

## 2021-11-15 ENCOUNTER — Other Ambulatory Visit (HOSPITAL_COMMUNITY): Payer: Self-pay

## 2021-11-15 MED ORDER — MELOXICAM 15 MG PO TABS
15.0000 mg | ORAL_TABLET | Freq: Every day | ORAL | 1 refills | Status: DC
  Filled 2021-11-15: qty 30, 30d supply, fill #0
  Filled 2021-12-22: qty 30, 30d supply, fill #1

## 2021-12-01 ENCOUNTER — Other Ambulatory Visit (HOSPITAL_COMMUNITY): Payer: Self-pay

## 2021-12-09 ENCOUNTER — Other Ambulatory Visit (HOSPITAL_COMMUNITY): Payer: Self-pay

## 2021-12-14 ENCOUNTER — Other Ambulatory Visit (HOSPITAL_BASED_OUTPATIENT_CLINIC_OR_DEPARTMENT_OTHER): Payer: Self-pay

## 2021-12-22 ENCOUNTER — Other Ambulatory Visit (HOSPITAL_COMMUNITY): Payer: Self-pay

## 2021-12-23 ENCOUNTER — Other Ambulatory Visit (HOSPITAL_COMMUNITY): Payer: Self-pay

## 2021-12-24 ENCOUNTER — Other Ambulatory Visit (HOSPITAL_COMMUNITY): Payer: Self-pay

## 2021-12-24 MED ORDER — INSULIN PEN NEEDLE 32G X 6 MM MISC
3 refills | Status: AC
  Filled 2021-12-24: qty 100, 50d supply, fill #0
  Filled 2022-07-12: qty 200, 90d supply, fill #1
  Filled 2022-11-16: qty 200, 90d supply, fill #2

## 2021-12-29 ENCOUNTER — Other Ambulatory Visit (HOSPITAL_COMMUNITY): Payer: Self-pay

## 2022-01-07 DIAGNOSIS — M10061 Idiopathic gout, right knee: Secondary | ICD-10-CM | POA: Diagnosis not present

## 2022-01-13 ENCOUNTER — Other Ambulatory Visit (HOSPITAL_COMMUNITY): Payer: Self-pay

## 2022-01-13 DIAGNOSIS — Z720 Tobacco use: Secondary | ICD-10-CM | POA: Diagnosis not present

## 2022-01-13 DIAGNOSIS — E1165 Type 2 diabetes mellitus with hyperglycemia: Secondary | ICD-10-CM | POA: Diagnosis not present

## 2022-01-13 DIAGNOSIS — Z794 Long term (current) use of insulin: Secondary | ICD-10-CM | POA: Diagnosis not present

## 2022-01-13 DIAGNOSIS — Z89429 Acquired absence of other toe(s), unspecified side: Secondary | ICD-10-CM | POA: Diagnosis not present

## 2022-01-13 MED ORDER — INSULIN LISPRO (1 UNIT DIAL) 100 UNIT/ML (KWIKPEN)
4.0000 [IU] | PEN_INJECTOR | Freq: Every day | SUBCUTANEOUS | 4 refills | Status: DC
  Filled 2022-01-13: qty 3, 28d supply, fill #0
  Filled 2022-03-28: qty 3, 28d supply, fill #1

## 2022-01-13 MED ORDER — DEXCOM G7 SENSOR MISC
1.0000 | 4 refills | Status: AC
  Filled 2022-01-13: qty 9, 90d supply, fill #0

## 2022-01-13 MED ORDER — TRESIBA FLEXTOUCH 200 UNIT/ML ~~LOC~~ SOPN
100.0000 [IU] | PEN_INJECTOR | Freq: Every day | SUBCUTANEOUS | 4 refills | Status: DC
  Filled 2022-01-13: qty 45, 45d supply, fill #0
  Filled 2022-03-28: qty 33, 66d supply, fill #0
  Filled 2022-05-18: qty 33, 66d supply, fill #1
  Filled 2022-07-12: qty 33, 66d supply, fill #2

## 2022-01-14 ENCOUNTER — Other Ambulatory Visit (HOSPITAL_COMMUNITY): Payer: Self-pay

## 2022-01-17 ENCOUNTER — Other Ambulatory Visit (HOSPITAL_COMMUNITY): Payer: Self-pay

## 2022-01-17 MED ORDER — DEXCOM G7 RECEIVER DEVI
0 refills | Status: DC
  Filled 2022-01-17: qty 1, 30d supply, fill #0

## 2022-01-18 ENCOUNTER — Other Ambulatory Visit (HOSPITAL_COMMUNITY): Payer: Self-pay

## 2022-01-24 ENCOUNTER — Other Ambulatory Visit (HOSPITAL_COMMUNITY): Payer: Self-pay

## 2022-02-03 ENCOUNTER — Other Ambulatory Visit (HOSPITAL_COMMUNITY): Payer: Self-pay

## 2022-02-04 ENCOUNTER — Other Ambulatory Visit (HOSPITAL_COMMUNITY): Payer: Self-pay

## 2022-02-04 MED ORDER — LISINOPRIL 10 MG PO TABS
10.0000 mg | ORAL_TABLET | Freq: Every day | ORAL | 3 refills | Status: DC
  Filled 2022-02-04: qty 43, 43d supply, fill #0
  Filled 2022-02-04: qty 47, 47d supply, fill #0
  Filled 2022-07-18: qty 90, 90d supply, fill #1
  Filled 2022-12-26: qty 90, 90d supply, fill #2

## 2022-02-07 ENCOUNTER — Other Ambulatory Visit: Payer: Self-pay

## 2022-02-07 ENCOUNTER — Emergency Department (HOSPITAL_BASED_OUTPATIENT_CLINIC_OR_DEPARTMENT_OTHER)
Admission: EM | Admit: 2022-02-07 | Discharge: 2022-02-07 | Disposition: A | Discharge status: Home / Self Care | Payer: 59 | Attending: Emergency Medicine | Admitting: Emergency Medicine

## 2022-02-07 ENCOUNTER — Encounter (HOSPITAL_BASED_OUTPATIENT_CLINIC_OR_DEPARTMENT_OTHER): Payer: Self-pay | Admitting: Emergency Medicine

## 2022-02-07 DIAGNOSIS — L0291 Cutaneous abscess, unspecified: Secondary | ICD-10-CM

## 2022-02-07 DIAGNOSIS — L0501 Pilonidal cyst with abscess: Secondary | ICD-10-CM | POA: Diagnosis not present

## 2022-02-07 MED ORDER — DOXYCYCLINE HYCLATE 100 MG PO TABS
100.0000 mg | ORAL_TABLET | Freq: Once | ORAL | Status: AC
  Administered 2022-02-07: 100 mg via ORAL
  Filled 2022-02-07: qty 1

## 2022-02-07 MED ORDER — DOXYCYCLINE HYCLATE 100 MG PO CAPS: 100.0000 mg | ORAL_CAPSULE | Freq: Two times a day (BID) | ORAL | 0 refills | Status: DC

## 2022-02-07 MED ORDER — LIDOCAINE-EPINEPHRINE (PF) 2 %-1:200000 IJ SOLN
10.0000 mL | Freq: Once | INTRAMUSCULAR | Status: AC
  Administered 2022-02-07: 10 mL via INTRADERMAL
  Filled 2022-02-07: qty 20

## 2022-02-07 NOTE — ED Triage Notes (Signed)
Reports a cyst or abscess to his "butt crack" for the last two days.

## 2022-02-07 NOTE — Discharge Instructions (Signed)
Please have someone see this in 48 hours to maybe remove the packing.  Warm compresses at least 4 times a day.  Please return for rapid spreading redness or develop a fever.  Take the antibiotics as prescribed.

## 2022-02-07 NOTE — ED Provider Notes (Signed)
[facility]PART[facility] Provider Note   CSN: [id number] Arrival date & time: 02/07/22  0401     History  Chief Complaint  Patient presents with   Abscess    Martin [name] is a 56 y.o. male.  74 yoO M with a chief complaints of a lesion on his bottom.  He noticed this a couple days ago.  No fevers or chills.  Feels like it started to drain.  Is gotten more more painful.   Abscess      Home Medications Prior to Admission medications   Medication Sig Start Date End Date Taking? Authorizing Provider  doxycycline (VIBRAMYCIN) 100 MG capsule Take 1 capsule (100 mg total) by mouth 2 (two) times daily. One po bid x 7 days 02/07/22  Yes Tyrone Nine, Chenise Mulvihill, DO  atorvastatin (LIPITOR) 80 MG tablet TAKE 1 TABLET BY MOUTH ONCE A DAY AT BEDTIME 06/04/20 06/04/21  Delrae Rend, MD  atorvastatin (LIPITOR) 80 MG tablet Take 1 tablet (80 mg total) by mouth at bedtime. 11/08/21     Blood Glucose Monitoring Suppl (BLOOD GLUCOSE MONITOR SYSTEM) w/Device KIT Use up to four times daily as directed. 01/25/21   Darliss Cheney, MD  Continuous Blood Gluc Receiver (DEXCOM G7 RECEIVER) DEVI Use as directed 01/17/22     Continuous Blood Gluc Sensor (DEXCOM G7 SENSOR) MISC Change 1 sensor every 10 days on insulin 3 times daily 01/13/22     COVID-19 At Home Antigen Test [facility] COVID-19 HOME TEST) KIT Use as directed Patient not taking: Reported on 06/18/2021 05/05/21   Jefm Bryant, RPH  cyclobenzaprine (FLEXERIL) 10 MG tablet Take 1 tablet (10 mg total) by mouth 2 (two) times daily as needed for muscle spasms. 11/08/21   Horton, Alvin Critchley, DO  Dulaglutide (TRULICITY) 9.62 IW/9.7LG SOPN Inject 0.75 mg into the skin once a week. 01/28/21     Dulaglutide (TRULICITY) 1.5 XQ/1.1HE SOPN Inject 1.5 mg into the skin once a week. 04/27/21     empagliflozin (JARDIANCE) 25 MG TABS tablet Take 1 tablet (25 mg total) by mouth daily. 09/17/20     empagliflozin (JARDIANCE) 25 MG TABS tablet Take 1 tablet (25 mg  total) by mouth daily. 01/28/21     furosemide (LASIX) 20 MG tablet Take 1 tablet (20 mg total) by mouth daily as needed. 01/29/21     gabapentin (NEURONTIN) 300 MG capsule Take 1 capsule (300 mg total) by mouth 3 (three) times daily as needed. 05/17/20   Hayden Rasmussen, MD  gentamicin cream (GARAMYCIN) 0.1 % Apply 1 application topically 3 (three) times daily. 12/14/20   Edrick Kins, DPM  glucose blood (FREESTYLE LITE) test strip Use three times daily as directed 03/09/21     insulin degludec (TRESIBA FLEXTOUCH) 200 UNIT/ML FlexTouch Pen Inject 110 Units into the skin daily. 05/17/21     insulin degludec (TRESIBA FLEXTOUCH) 200 UNIT/ML FlexTouch Pen Inject 100 Units into the skin daily. 01/13/22     insulin lispro (HUMALOG KWIKPEN) 100 UNIT/ML KwikPen Inject 4 Units into the skin daily at the beginning of the largest meal for the day. Expires 28 days after initial use 04/27/21     insulin lispro (HUMALOG KWIKPEN) 100 UNIT/ML KwikPen Inject 4 Units into the skin at beginning of largest meal daily. 01/13/22     Insulin Pen Needle 32G X 6 MM MISC Use to inject insulin 2 times a day for 90 days. 12/24/21   Delrae Rend, MD  Lancets (FREESTYLE) lancets Use three times  daily as directed 03/09/21   Delrae Rend, MD  lidocaine (LIDODERM) 5 % Place 1 patch onto the skin daily. Remove & Discard patch within 12 hours or as directed by MD 11/08/21   Horton, Alvin Critchley, DO  lisinopril (ZESTRIL) 10 MG tablet TAKE 1 TABLET BY MOUTH ONCE DAILY. 08/21/20 08/21/21  Delrae Rend, MD  lisinopril (ZESTRIL) 10 MG tablet Take 1 tablet by mouth daily. 03/16/21   [provider]  lisinopril (ZESTRIL) 10 MG tablet Take 1 tablet (10 mg total) by mouth daily. 02/04/22     meloxicam (MOBIC) 15 MG tablet Take 1 tablet by mouth daily. 01/29/21   [provider]  meloxicam (MOBIC) 15 MG tablet Take 1 tablet (15 mg total) by mouth daily. 02/15/21     meloxicam (MOBIC) 15 MG tablet Take 1 tablet (15 mg total) by mouth  daily. 05/19/21     meloxicam (MOBIC) 15 MG tablet Take 1 tablet (15 mg total) by mouth daily. 11/15/21     metFORMIN (GLUCOPHAGE) 1000 MG tablet Take by mouth. 03/16/21   [provider]  metFORMIN (GLUCOPHAGE-XR) 500 MG 24 hr tablet TAKE 2 TABLETS BY MOUTH WITH A MEAL TWICE A DAY. 08/28/20 08/28/21  Delrae Rend, MD  metFORMIN (GLUCOPHAGE-XR) 500 MG 24 hr tablet Take 2 tablets (1,000 mg total) by mouth 2 (two) times daily with a meal. 11/08/21     mupirocin ointment (BACTROBAN) 2 % Apply 1 application topically 2 (two) times daily. 06/15/21   Trula Slade, DPM  ONETOUCH VERIO test strip USE AS INSTRUCTED 2-3 X A DAY. 08/07/13   Philemon Kingdom, MD  sildenafil (VIAGRA) 100 MG tablet Take 1 tablet (100 mg total) by mouth up to once daily as needed 12/11/20     sildenafil (VIAGRA) 100 MG tablet Take 1/2 to one tablet one hour before sex for E.D. 03/16/21     triamcinolone (KENALOG) 0.025 % ointment Apply 1 application topically 2 (two) times daily. 01/28/21   Trula Slade, DPM      Allergies    Patient has no known allergies.    Review of Systems   Review of Systems  Physical Exam Updated Vital Signs BP 139/73 (BP Location: Right Arm)   Pulse 82   Temp 97.9 F (36.6 C) (Oral)   Resp 18   Ht _0  (1.803 m)   Wt 118.8 kg   SpO2 100%   BMI 36.53 kg/m  Physical Exam Vitals and nursing note reviewed.  Constitutional:      Appearance: He is well-developed.  HENT:     Head: Normocephalic and atraumatic.  Eyes:     Pupils: Pupils are equal, round, and reactive to light.  Neck:     Vascular: No JVD.  Cardiovascular:     Rate and Rhythm: Normal rate and regular rhythm.     Heart sounds: No murmur heard.    No friction rub. No gallop.  Pulmonary:     Effort: No respiratory distress.     Breath sounds: No wheezing.  Abdominal:     General: There is no distension.     Tenderness: There is no abdominal tenderness. There is no guarding or rebound.   Genitourinary:      Comments: Area of erythema and induration.  Some chronic skin changes overlying. Musculoskeletal:        General: Normal range of motion.     Cervical back: Normal range of motion and neck supple.  Skin:    Coloration: Skin  is not pale.     Findings: No rash.  Neurological:     Mental Status: He is alert and oriented to person, place, and time.  Psychiatric:        Behavior: Behavior normal.     ED Results / Procedures / Treatments   Labs (all labs ordered are listed, but only abnormal results are displayed) Labs Reviewed - No data to display  EKG None  Radiology No results found.  Procedures .Marland KitchenIncision and Drainage  Date/Time: 02/07/2022 5:19 AM  Performed by: Deno Etienne, DO Authorized by: Deno Etienne, DO   Consent:    Consent obtained:  Verbal   Consent given by:  Patient   Risks, benefits, and alternatives were discussed: yes     Risks discussed:  Bleeding, incomplete drainage and infection   Alternatives discussed:  No treatment, delayed treatment and alternative treatment Universal protocol:    Procedure explained and questions answered to patient or proxy's satisfaction: yes     Immediately prior to procedure, a time out was called: yes     Patient identity confirmed:  Verbally with patient Location:    Type:  Abscess   Size:  Quarter   Location:  Anogenital   Anogenital location:  Pilonidal Pre-procedure details:    Skin preparation:  Chlorhexidine Sedation:    Sedation type:  None Anesthesia:    Anesthesia method:  Local infiltration   Local anesthetic:  Lidocaine 2% WITH epi Procedure type:    Complexity:  Complex Procedure details:    Ultrasound guidance: no     Incision types:  Cruciate   Incision depth:  Subcutaneous   Wound management:  Probed and deloculated   Drainage:  Bloody and purulent   Drainage amount:  Copious   Wound treatment:  Wound left open   Packing materials:  1/2 in iodoform gauze Post-procedure  details:    Procedure completion:  Tolerated well, no immediate complications     Medications Ordered in ED Medications  doxycycline (VIBRA-TABS) tablet 100 mg (has no administration in time range)  lidocaine-EPINEPHrine (XYLOCAINE W/EPI) 2 %-1:200000 (PF) injection 10 mL (10 mLs Intradermal Given 02/07/22 0623)    ED Course/ Medical Decision Making/ A&P                           Medical Decision Making Risk Prescription drug management.   56 yo M with a chief complaints of abscess.  This is in the pilonidal region.  Patient has never had 1 of these before.  Significance cavity on I&D.  Packing placed.  We will have him return in 48 hours for repeat assessment.  5:20 AM:  I have discussed the diagnosis/risks/treatment options with the patient.  Evaluation and diagnostic testing in the emergency department does not suggest an emergent condition requiring admission or immediate intervention beyond what has been performed at this time.  They will follow up with PCP. We also discussed returning to the ED immediately if new or worsening sx occur. We discussed the sx which are most concerning (e.g., sudden worsening pain, fever, inability to tolerate by mouth, rapid spreading redness) that necessitate immediate return. Medications administered to the patient during their visit and any new prescriptions provided to the patient are listed below.  Medications given during this visit Medications  doxycycline (VIBRA-TABS) tablet 100 mg (has no administration in time range)  lidocaine-EPINEPHrine (XYLOCAINE W/EPI) 2 %-1:200000 (PF) injection 10 mL (10 mLs Intradermal Given 02/07/22 0512)  The patient appears reasonably screen and/or stabilized for discharge and I doubt any other medical condition or other [facility] requiring further screening, evaluation, or treatment in the ED at this time prior to discharge.          Final Clinical Impression(s) / ED Diagnoses Final diagnoses:  Abscess     Rx / DC Orders ED Discharge Orders          Ordered    doxycycline (VIBRAMYCIN) 100 MG capsule  2 times daily        02/07/22 0516              Deno Etienne, DO 02/07/22 4163

## 2022-02-09 DIAGNOSIS — L0501 Pilonidal cyst with abscess: Secondary | ICD-10-CM | POA: Diagnosis not present

## 2022-02-16 ENCOUNTER — Other Ambulatory Visit (HOSPITAL_COMMUNITY): Payer: Self-pay

## 2022-02-17 ENCOUNTER — Other Ambulatory Visit (HOSPITAL_COMMUNITY): Payer: Self-pay

## 2022-02-18 ENCOUNTER — Other Ambulatory Visit (HOSPITAL_COMMUNITY): Payer: Self-pay

## 2022-02-18 MED ORDER — MELOXICAM 15 MG PO TABS
15.0000 mg | ORAL_TABLET | Freq: Every day | ORAL | 1 refills | Status: DC
  Filled 2022-02-18: qty 30, 30d supply, fill #0

## 2022-03-08 ENCOUNTER — Other Ambulatory Visit (HOSPITAL_COMMUNITY): Payer: Self-pay

## 2022-03-10 ENCOUNTER — Other Ambulatory Visit (HOSPITAL_COMMUNITY): Payer: Self-pay

## 2022-03-11 ENCOUNTER — Other Ambulatory Visit (HOSPITAL_COMMUNITY): Payer: Self-pay

## 2022-03-28 ENCOUNTER — Other Ambulatory Visit (HOSPITAL_COMMUNITY): Payer: Self-pay

## 2022-03-29 ENCOUNTER — Other Ambulatory Visit (HOSPITAL_COMMUNITY): Payer: Self-pay

## 2022-03-29 MED ORDER — JARDIANCE 25 MG PO TABS
25.0000 mg | ORAL_TABLET | Freq: Every day | ORAL | 11 refills | Status: DC
  Filled 2022-03-29: qty 30, 30d supply, fill #0
  Filled 2022-05-18: qty 30, 30d supply, fill #1
  Filled 2022-07-18: qty 30, 30d supply, fill #2
  Filled 2022-09-19: qty 30, 30d supply, fill #3
  Filled 2022-11-14: qty 30, 30d supply, fill #4
  Filled 2023-01-16: qty 30, 30d supply, fill #5

## 2022-04-12 ENCOUNTER — Other Ambulatory Visit (HOSPITAL_COMMUNITY): Payer: Self-pay

## 2022-04-27 ENCOUNTER — Other Ambulatory Visit (HOSPITAL_COMMUNITY): Payer: Self-pay

## 2022-04-27 ENCOUNTER — Other Ambulatory Visit (HOSPITAL_BASED_OUTPATIENT_CLINIC_OR_DEPARTMENT_OTHER): Payer: Self-pay

## 2022-04-27 MED ORDER — SILDENAFIL CITRATE 100 MG PO TABS
50.0000 mg | ORAL_TABLET | ORAL | 0 refills | Status: DC
  Filled 2022-04-27: qty 6, 30d supply, fill #0
  Filled 2022-06-17: qty 4, 4d supply, fill #1

## 2022-05-18 ENCOUNTER — Other Ambulatory Visit (HOSPITAL_COMMUNITY): Payer: Self-pay

## 2022-05-27 ENCOUNTER — Other Ambulatory Visit (HOSPITAL_COMMUNITY): Payer: Self-pay

## 2022-05-27 DIAGNOSIS — M1712 Unilateral primary osteoarthritis, left knee: Secondary | ICD-10-CM | POA: Diagnosis not present

## 2022-05-27 DIAGNOSIS — M1711 Unilateral primary osteoarthritis, right knee: Secondary | ICD-10-CM | POA: Diagnosis not present

## 2022-05-27 DIAGNOSIS — M170 Bilateral primary osteoarthritis of knee: Secondary | ICD-10-CM | POA: Diagnosis not present

## 2022-05-27 MED ORDER — MELOXICAM 15 MG PO TABS
15.0000 mg | ORAL_TABLET | Freq: Every day | ORAL | 1 refills | Status: DC
  Filled 2022-05-27: qty 30, 30d supply, fill #0

## 2022-06-17 ENCOUNTER — Other Ambulatory Visit (HOSPITAL_COMMUNITY): Payer: Self-pay

## 2022-06-27 ENCOUNTER — Other Ambulatory Visit (HOSPITAL_COMMUNITY): Payer: Self-pay

## 2022-06-27 DIAGNOSIS — M10061 Idiopathic gout, right knee: Secondary | ICD-10-CM | POA: Diagnosis not present

## 2022-06-27 DIAGNOSIS — M25562 Pain in left knee: Secondary | ICD-10-CM | POA: Diagnosis not present

## 2022-06-27 MED ORDER — MELOXICAM 15 MG PO TABS
15.0000 mg | ORAL_TABLET | Freq: Every day | ORAL | 1 refills | Status: DC
  Filled 2022-06-27: qty 30, 30d supply, fill #0
  Filled 2023-04-26: qty 30, 30d supply, fill #1

## 2022-06-27 MED ORDER — COLCHICINE 0.6 MG PO TABS
ORAL_TABLET | ORAL | 0 refills | Status: DC
  Filled 2022-06-27: qty 9, 3d supply, fill #0

## 2022-07-07 ENCOUNTER — Telehealth: Payer: Self-pay | Admitting: Podiatry

## 2022-07-07 NOTE — Telephone Encounter (Signed)
Pts wife called to get patient appt for L great toe, pain, swollen, injury, oozing, discoloration. I offered appt for today as Dr Jacqualyn Posey is not in the office today or tomorrow. Pt declined, only wants to see Dr Jacqualyn Posey. Appt given for Tues 2/6 at 415p.

## 2022-07-12 ENCOUNTER — Ambulatory Visit (INDEPENDENT_AMBULATORY_CARE_PROVIDER_SITE_OTHER): Payer: Commercial Managed Care - PPO

## 2022-07-12 ENCOUNTER — Ambulatory Visit: Payer: Commercial Managed Care - PPO | Admitting: Podiatry

## 2022-07-12 ENCOUNTER — Other Ambulatory Visit (HOSPITAL_COMMUNITY): Payer: Self-pay

## 2022-07-12 ENCOUNTER — Encounter: Payer: Self-pay | Admitting: Podiatry

## 2022-07-12 DIAGNOSIS — M79672 Pain in left foot: Secondary | ICD-10-CM

## 2022-07-12 DIAGNOSIS — E1149 Type 2 diabetes mellitus with other diabetic neurological complication: Secondary | ICD-10-CM

## 2022-07-12 DIAGNOSIS — M86172 Other acute osteomyelitis, left ankle and foot: Secondary | ICD-10-CM | POA: Diagnosis not present

## 2022-07-12 DIAGNOSIS — T1490XA Injury, unspecified, initial encounter: Secondary | ICD-10-CM

## 2022-07-12 DIAGNOSIS — L03116 Cellulitis of left lower limb: Secondary | ICD-10-CM

## 2022-07-12 DIAGNOSIS — S92424A Nondisplaced fracture of distal phalanx of right great toe, initial encounter for closed fracture: Secondary | ICD-10-CM | POA: Diagnosis not present

## 2022-07-12 MED ORDER — AMOXICILLIN-POT CLAVULANATE 875-125 MG PO TABS
1.0000 | ORAL_TABLET | Freq: Two times a day (BID) | ORAL | 0 refills | Status: DC
  Filled 2022-07-12: qty 20, 10d supply, fill #0

## 2022-07-12 MED ORDER — CIPROFLOXACIN HCL 500 MG PO TABS
500.0000 mg | ORAL_TABLET | Freq: Two times a day (BID) | ORAL | 0 refills | Status: DC
  Filled 2022-07-12: qty 20, 10d supply, fill #0

## 2022-07-12 NOTE — Progress Notes (Signed)
Subjective: Chief Complaint  Patient presents with   Foot Pain    Pain located in the great hallux, stubbed toe, dog bite, injury at work, 2-3 weeks    57 year old male presents the office with above concerns.  He states he had a couple injuries to his toes.  He stubbed his toe but also something heavy fell on his toe at work about 2 to 3 weeks ago.  He also does have a dog bite he reports the toe.  Now has a wound exam swelling and redness of the toe.  No pus.  He does not endorse any fevers or chills.  No recent treatment.  Objective: AAO x3, NAD DP/PT pulses palpable bilaterally, CRT less than 3 seconds To the left  hallux there is edema and erythema present with full-thickness ulceration of the distal aspect of the toe which probes to bone.  There is no fluctuance or crepitation.  No malodor.  There is no drainage or pus.  There is no ascending cellulitis.  No pain on exam although does have neuropathy. No pain with calf compression, swelling, warmth, erythema  Assessment: Fracture, concern for osteomyelitis left hallux  Plan: -All treatment options discussed with the patient including all alternatives, risks, complications.  -X-rays of the left foot were obtained.  3 views were obtained.  Fracture noted the distal tuft of the distal phalanx of the hallux.  No soft tissue edema. -Patient had multiple injuries now has a wound which probes to bone.  The fracture could be result of osteomyelitis versus the injury. -Will start antibiotics.  I prescribed Augmentin as well as Cipro. -Will check blood work including CBC, sed rate, CRP, BMP, A1c -Surgical shoe for offloading with toe guard -Discussed with patient is at high risk of amputation -Monitor for any clinical signs or symptoms of infection and directed to call the office immediately should any occur or go to the ER.  No follow-ups on file.  Trula Slade DPM   -Patient encouraged to call the office with any questions,  concerns, change in symptoms.

## 2022-07-12 NOTE — Patient Instructions (Signed)
Monitor for any signs/symptoms of infection. Call the office immediately if any occur or go directly to the emergency room. Call with any questions/concerns.  

## 2022-07-13 ENCOUNTER — Other Ambulatory Visit (HOSPITAL_COMMUNITY): Payer: Self-pay

## 2022-07-13 DIAGNOSIS — L03116 Cellulitis of left lower limb: Secondary | ICD-10-CM | POA: Diagnosis not present

## 2022-07-13 DIAGNOSIS — E1149 Type 2 diabetes mellitus with other diabetic neurological complication: Secondary | ICD-10-CM | POA: Diagnosis not present

## 2022-07-14 ENCOUNTER — Other Ambulatory Visit (HOSPITAL_COMMUNITY): Payer: Self-pay

## 2022-07-14 LAB — BASIC METABOLIC PANEL WITH GFR
BUN/Creatinine Ratio: 12 (ref 9–20)
BUN: 12 mg/dL (ref 6–24)
CO2: 21 mmol/L (ref 20–29)
Calcium: 9.1 mg/dL (ref 8.7–10.2)
Chloride: 97 mmol/L (ref 96–106)
Creatinine, Ser: 0.99 mg/dL (ref 0.76–1.27)
Glucose: 367 mg/dL — ABNORMAL HIGH (ref 70–99)
Potassium: 4.4 mmol/L (ref 3.5–5.2)
Sodium: 136 mmol/L (ref 134–144)
eGFR: 89 mL/min/{1.73_m2}

## 2022-07-14 LAB — CBC WITH DIFFERENTIAL/PLATELET
Basophils Absolute: 0.1 10*3/uL (ref 0.0–0.2)
Basos: 1 %
EOS (ABSOLUTE): 0.5 10*3/uL — ABNORMAL HIGH (ref 0.0–0.4)
Eos: 5 %
Hematocrit: 47.3 % (ref 37.5–51.0)
Hemoglobin: 15.2 g/dL (ref 13.0–17.7)
Immature Grans (Abs): 0.1 10*3/uL (ref 0.0–0.1)
Immature Granulocytes: 1 %
Lymphocytes Absolute: 1.9 10*3/uL (ref 0.7–3.1)
Lymphs: 18 %
MCH: 28.5 pg (ref 26.6–33.0)
MCHC: 32.1 g/dL (ref 31.5–35.7)
MCV: 89 fL (ref 79–97)
Monocytes Absolute: 1 10*3/uL — ABNORMAL HIGH (ref 0.1–0.9)
Monocytes: 9 %
Neutrophils Absolute: 7.1 10*3/uL — ABNORMAL HIGH (ref 1.4–7.0)
Neutrophils: 66 %
Platelets: 236 10*3/uL (ref 150–450)
RBC: 5.33 x10E6/uL (ref 4.14–5.80)
RDW: 12.4 % (ref 11.6–15.4)
WBC: 10.4 10*3/uL (ref 3.4–10.8)

## 2022-07-14 LAB — HEMOGLOBIN A1C
Est. average glucose Bld gHb Est-mCnc: 286 mg/dL
Hgb A1c MFr Bld: 11.6 % — ABNORMAL HIGH (ref 4.8–5.6)

## 2022-07-14 LAB — C-REACTIVE PROTEIN: CRP: 12 mg/L — ABNORMAL HIGH (ref 0–10)

## 2022-07-14 LAB — SEDIMENTATION RATE: Sed Rate: 15 mm/h (ref 0–30)

## 2022-07-15 ENCOUNTER — Other Ambulatory Visit (HOSPITAL_COMMUNITY): Payer: Self-pay

## 2022-07-21 ENCOUNTER — Other Ambulatory Visit (HOSPITAL_COMMUNITY): Payer: Self-pay

## 2022-07-22 ENCOUNTER — Other Ambulatory Visit (HOSPITAL_COMMUNITY): Payer: Self-pay

## 2022-07-22 ENCOUNTER — Encounter: Payer: Self-pay | Admitting: Podiatry

## 2022-07-22 ENCOUNTER — Ambulatory Visit (INDEPENDENT_AMBULATORY_CARE_PROVIDER_SITE_OTHER): Payer: Commercial Managed Care - PPO

## 2022-07-22 ENCOUNTER — Ambulatory Visit: Payer: Commercial Managed Care - PPO | Admitting: Podiatry

## 2022-07-22 DIAGNOSIS — L03116 Cellulitis of left lower limb: Secondary | ICD-10-CM | POA: Diagnosis not present

## 2022-07-22 MED ORDER — CIPROFLOXACIN HCL 500 MG PO TABS
500.0000 mg | ORAL_TABLET | Freq: Two times a day (BID) | ORAL | 0 refills | Status: DC
  Filled 2022-07-22: qty 20, 10d supply, fill #0

## 2022-07-22 MED ORDER — AMOXICILLIN-POT CLAVULANATE 875-125 MG PO TABS
1.0000 | ORAL_TABLET | Freq: Two times a day (BID) | ORAL | 0 refills | Status: DC
  Filled 2022-07-22: qty 20, 10d supply, fill #0

## 2022-07-22 NOTE — Patient Instructions (Signed)
Continue antibiotics. If there is any worsening you need to go straight to the ER.

## 2022-07-25 ENCOUNTER — Telehealth: Payer: Self-pay | Admitting: Urology

## 2022-07-25 DIAGNOSIS — Z794 Long term (current) use of insulin: Secondary | ICD-10-CM | POA: Diagnosis not present

## 2022-07-25 DIAGNOSIS — E1165 Type 2 diabetes mellitus with hyperglycemia: Secondary | ICD-10-CM | POA: Diagnosis not present

## 2022-07-25 DIAGNOSIS — M869 Osteomyelitis, unspecified: Secondary | ICD-10-CM | POA: Diagnosis not present

## 2022-07-25 NOTE — Telephone Encounter (Signed)
DOS - 07/29/22  AMPUTATION MPJ 1ST LEFT --- [id number]  [employer] - 06/06/22  PER [employer] AUTOMATIVE SYSTEM FOR CPT CODE 29562 NO PRIOR AUTH IS REQUIRED.  REF # [id number]

## 2022-07-25 NOTE — H&P (View-Only) (Signed)
Subjective: Chief Complaint  Patient presents with   Foot Ulcer    Left hallux ulcer      57 year old male presents the office with above concerns.  He is not having any pain although he does have neuropathy.  He denies any drainage or pus.  He is on the antibiotics.  Denies any fevers or chills.  Has no other concerns today.     Objective: AAO x3, NAD DP/PT pulses palpable bilaterally, CRT less than 3 seconds To the left  hallux there is edema and erythema present with full-thickness ulceration of the distal aspect of the toe which probes to bone.  Swelling remains the same and is quite significant on the hallux.  There is no ascending cellulitis.  There is no fluctuance or crepitation but there is no malodor. No pain with calf compression, swelling, warmth, erythema  Assessment: Osteomyelitis left hallux  Plan: -All treatment options discussed with the patient including all alternatives, risks, complications.  -X-rays of the left foot were obtained.  3 views were obtained.  Lucency noted along the distal phalanx consistent with osteomyelitis. -At this time given the infection we discussed try to salvage the toe versus amputation.  My recommendation is amputation.  Patient came very upset when I discussed this.  I do think this is best option given the infection.  After long discussion he states he is going to think about it and let me know on Monday.  Continue antibiotics for now.  Discussed things to monitor closely denser signs or symptoms of worsening infection report directly to the emergency room should any occur.  Trula Slade DPM

## 2022-07-25 NOTE — Progress Notes (Signed)
Subjective: Chief Complaint  Patient presents with   Foot Ulcer    Left hallux ulcer      57 year old male presents the office with above concerns.  He is not having any pain although he does have neuropathy.  He denies any drainage or pus.  He is on the antibiotics.  Denies any fevers or chills.  Has no other concerns today.     Objective: AAO x3, NAD DP/PT pulses palpable bilaterally, CRT less than 3 seconds To the left  hallux there is edema and erythema present with full-thickness ulceration of the distal aspect of the toe which probes to bone.  Swelling remains the same and is quite significant on the hallux.  There is no ascending cellulitis.  There is no fluctuance or crepitation but there is no malodor. No pain with calf compression, swelling, warmth, erythema  Assessment: Osteomyelitis left hallux  Plan: -All treatment options discussed with the patient including all alternatives, risks, complications.  -X-rays of the left foot were obtained.  3 views were obtained.  Lucency noted along the distal phalanx consistent with osteomyelitis. -At this time given the infection we discussed try to salvage the toe versus amputation.  My recommendation is amputation.  Patient came very upset when I discussed this.  I do think this is best option given the infection.  After long discussion he states he is going to think about it and let me know on Monday.  Continue antibiotics for now.  Discussed things to monitor closely denser signs or symptoms of worsening infection report directly to the emergency room should any occur.  Trula Slade DPM

## 2022-07-26 NOTE — Progress Notes (Signed)
Surgery orders requested via Epic inbox. °

## 2022-07-27 NOTE — Patient Instructions (Signed)
SURGICAL WAITING ROOM VISITATION  Patients having surgery or a procedure may have no more than 2 support people in the waiting area - these visitors may rotate.    Children under the age of 19 must have an adult with them who is not the patient.  Due to an increase in RSV and influenza rates and associated hospitalizations, children ages 43 and under may not visit patients in [place].  If the patient needs to stay at the hospital during part of their recovery, the visitor guidelines for inpatient rooms apply. Pre-op nurse will coordinate an appropriate time for 1 support person to accompany patient in pre-op.  This support person may not rotate.    Please refer to the [facility] website for the visitor guidelines for Inpatients (after your surgery is over and you are in a regular room).    Your procedure is scheduled on: 07/29/22   Report to [facility] Main Entrance    Report to admitting at 12:00 PM   Call this number if you have problems the morning of surgery [phone]   Do not eat food or drink liquids :After Midnight.          If you have questions, please contact your surgeon's office.   FOLLOW BOWEL PREP AND ANY ADDITIONAL PRE OP INSTRUCTIONS YOU RECEIVED FROM YOUR SURGEON'S OFFICE!!!     Oral Hygiene is also important to reduce your risk of infection.                                    Remember - BRUSH YOUR TEETH THE MORNING OF SURGERY WITH YOUR REGULAR TOOTHPASTE  DENTURES WILL BE REMOVED PRIOR TO SURGERY PLEASE DO NOT APPLY "Poly grip" OR ADHESIVES!!!   Take these medicines the morning of surgery with A SIP OF WATER: Augmentin, Cipro  DO NOT TAKE ANY ORAL DIABETIC MEDICATIONS DAY OF YOUR SURGERY  How to Manage Your Diabetes Before and After Surgery  Why is it important to control my blood sugar before and after surgery? Improving blood sugar levels before and after surgery helps healing and can limit problems. A way of improving blood  sugar control is eating a healthy diet by:  Eating less sugar and carbohydrates  Increasing activity/exercise  Talking with your doctor about reaching your blood sugar goals High blood sugars (greater than 180 mg/dL) can raise your risk of infections and slow your recovery, so you will need to focus on controlling your diabetes during the weeks before surgery. Make sure that the doctor who takes care of your diabetes knows about your planned surgery including the date and location.  How do I manage my blood sugar before surgery? Check your blood sugar at least 4 times a day, starting 2 days before surgery, to make sure that the level is not too high or low. Check your blood sugar the morning of your surgery when you wake up and every 2 hours until you get to the Short Stay unit. If your blood sugar is less than 70 mg/dL, you will need to treat for low blood sugar: Do not take insulin. Treat a low blood sugar (less than 70 mg/dL) with  cup of clear juice (cranberry or apple), 4 glucose tablets, OR glucose gel. Recheck blood sugar in 15 minutes after treatment (to make sure it is greater than 70 mg/dL). If your blood sugar is not greater than 70 mg/dL  on recheck, call [phone] for further instructions. Report your blood sugar to the short stay nurse when you get to Short Stay.  If you are admitted to the hospital after surgery: Your blood sugar will be checked by the staff and you will probably be given insulin after surgery (instead of oral diabetes medicines) to make sure you have good blood sugar levels. The goal for blood sugar control after surgery is 80-180 mg/dL.   WHAT DO I DO ABOUT MY DIABETES MEDICATION?  Do not take oral diabetes medicines (pills) the morning of surgery.  THE NIGHT BEFORE SURGERY, take 50% of [place]. Take Metformin as prescribed. Do not  take Jardiance.     THE MORNING OF SURGERY, do not take any diabetes medications  DO NOT TAKE THE FOLLOWING 7 DAYS PRIOR  TO SURGERY: Ozempic, Wegovy, Rybelsus (Semaglutide), Byetta (exenatide), Bydureon (exenatide ER), Victoza, Saxenda (liraglutide), or Trulicity (dulaglutide) Mounjaro (Tirzepatide) Adlyxin (Lixisenatide), Polyethylene Glycol Loxenatide.  Reviewed and Endorsed by [facility] Patient Education Committee, August 2015                              You may not have any metal on your body including jewelry, and body piercing             Do not wear lotions, powders, cologne, or deodorant  Do not shave  48 hours prior to surgery.               Men may shave face and neck.   Do not bring valuables to the hospital. [place].   Contacts, glasses, dentures or bridgework may not be worn into surgery.  DO NOT [place]. PHARMACY WILL DISPENSE MEDICATIONS LISTED ON YOUR MEDICATION LIST TO YOU DURING YOUR ADMISSION [place]!    Patients discharged on the day of surgery will not be allowed to drive home.  Someone NEEDS to stay with you for the first 24 hours after anesthesia.              Please read over the following fact sheets you were given: IF [place] [phone] Schneiders    If you received a COVID test during your pre-op visit  it is requested that you wear a mask when out in public, stay away from anyone that may not be feeling well and notify your surgeon if you develop symptoms. If you test positive for Covid or have been in contact with anyone that has tested positive in the last 10 days please notify you surgeon.    [facility] - Preparing for Surgery Before surgery, you can play an important role.  Because skin is not sterile, your skin needs to be as free of germs as possible.  You can reduce the number of germs on your skin by washing with CHG (chlorahexidine gluconate) soap before surgery.  CHG is an antiseptic cleaner which kills germs and bonds  with the skin to continue killing germs even after washing. Please DO NOT use if you have an allergy to CHG or antibacterial soaps.  If your skin becomes reddened/irritated stop using the CHG and inform your nurse when you arrive at Short Stay. Do not shave (including legs and underarms) for at least 48 hours prior to the first  CHG shower.  You may shave your face/neck.  Please follow these instructions carefully:  1.  Shower with CHG Soap the night before surgery and the  morning of surgery.  2.  If you choose to wash your hair, wash your hair first as usual with your normal  shampoo.  3.  After you shampoo, rinse your hair and body thoroughly to remove the shampoo.                             4.  Use CHG as you would any other liquid soap.  You can apply chg directly to the skin and wash.  Gently with a scrungie or clean washcloth.  5.  Apply the CHG Soap to your body ONLY FROM THE NECK DOWN.   Do   not use on face/ open                           Wound or open sores. Avoid contact with eyes, ears mouth and   genitals (private parts).                       Wash face,  Genitals (private parts) with your normal soap.             6.  Wash thoroughly, paying special attention to the area where your    surgery  will be performed.  7.  Thoroughly rinse your body with warm water from the neck down.  8.  DO NOT shower/wash with your normal soap after using and rinsing off the CHG Soap.                9.  Pat yourself dry with a clean towel.            10.  Wear clean pajamas.            11.  Place clean sheets on your bed the night of your first shower and do not  sleep with pets. Day of Surgery : Do not apply any lotions/deodorants the morning of surgery.  Please wear clean clothes to the hospital/surgery center.  FAILURE TO FOLLOW THESE INSTRUCTIONS MAY RESULT IN THE CANCELLATION OF YOUR SURGERY  PATIENT SIGNATURE_________________________________  NURSE  SIGNATURE__________________________________  ________________________________________________________________________

## 2022-07-27 NOTE — Progress Notes (Addendum)
COVID Vaccine Completed: yes  Date of COVID positive in last 90 days:  PCP - Dr. Redmond Pulling Cardiologist - Quay Burow, MD LOV 2016 for CP  H&P by Kathryne Eriksson, MD 07/25/22 on chart Medical clearance by Kevan Rosebush 07/25/22 on chart  Chest x-ray -  EKG -  Stress Test - 01/02/15 Epic ECHO -  Cardiac Cath -  Pacemaker/ICD device last checked: Spinal Cord Stimulator:  Bowel Prep -   Sleep Study -  CPAP -   Fasting Blood Sugar -  Checks Blood Sugar _____ times a day  Last dose of GLP1 agonist-  Trulicity?? GLP1 instructions:     Last dose of SGLT-2 inhibitors-  N/A SGLT-2 instructions: N/A   Blood Thinner Instructions: Aspirin Instructions: Last Dose:  Activity level:  Can go up a flight of stairs and perform activities of daily living without stopping and without symptoms of chest pain or shortness of breath.  Able to exercise without symptoms  Unable to go up a flight of stairs without symptoms of     Anesthesia review: A1C 11.6, DM2, HTN, CP 2016  Patient denies shortness of breath, fever, cough and chest pain at PAT appointment  Patient verbalized understanding of instructions that were given to them at the PAT appointment. Patient was also instructed that they will need to review over the PAT instructions again at home before surgery.

## 2022-07-28 ENCOUNTER — Encounter (HOSPITAL_COMMUNITY)
Admission: RE | Admit: 2022-07-28 | Discharge: 2022-07-28 | Disposition: A | Discharge status: Home / Self Care | Payer: Commercial Managed Care - PPO | Source: Ambulatory Visit | Attending: Podiatry | Admitting: Podiatry

## 2022-07-28 ENCOUNTER — Encounter (HOSPITAL_COMMUNITY): Payer: Self-pay

## 2022-07-28 VITALS — BP 138/82 | HR 88 | Temp 97.9°F | Resp 16 | Ht 71.0 in | Wt 252.0 lb

## 2022-07-28 DIAGNOSIS — Z794 Long term (current) use of insulin: Secondary | ICD-10-CM | POA: Insufficient documentation

## 2022-07-28 DIAGNOSIS — E11621 Type 2 diabetes mellitus with foot ulcer: Secondary | ICD-10-CM | POA: Insufficient documentation

## 2022-07-28 DIAGNOSIS — Z01818 Encounter for other preprocedural examination: Secondary | ICD-10-CM | POA: Diagnosis not present

## 2022-07-28 DIAGNOSIS — I10 Essential (primary) hypertension: Secondary | ICD-10-CM | POA: Insufficient documentation

## 2022-07-28 DIAGNOSIS — L97509 Non-pressure chronic ulcer of other part of unspecified foot with unspecified severity: Secondary | ICD-10-CM | POA: Diagnosis not present

## 2022-07-28 HISTORY — DX: Personal history of urinary calculi: Z87.442

## 2022-07-28 HISTORY — DX: Unspecified osteoarthritis, unspecified site: M19.90

## 2022-07-28 LAB — GLUCOSE, CAPILLARY: Glucose-Capillary: 454 mg/dL — ABNORMAL HIGH (ref 70–99)

## 2022-07-28 NOTE — Progress Notes (Signed)
Anesthesia Chart Review   Case: [id number] Date/Time: 07/29/22 1400   Procedure: AMPUTATION TOE FIRST (Left: Toe)   Anesthesia type: Choice   Pre-op diagnosis: Acute osteomyelitis of toe, left   Location: [facility] ROOM 01 / [facility]   Surgeons: Trula Slade, DPM       DISCUSSION:57 y.o. never smoker with h/o poorly controlled diabetes (A1C 11.6), acute osteomyelitis of left toe scheduled for above procedure 07/29/22 with Celesta Gentile, DPM.   H&P on chart. Per notes pt is high risk for surgery due to elevated A1C, but surgery is medically necessary.  Evaluate DOS.  VS: BP 138/82   Pulse 88   Temp 36.6 C (Oral)   Resp 16   Ht 5' 11"$  (1.803 m)   Wt 114.3 kg   SpO2 97%   BMI 35.15 kg/m   PROVIDERS: [facility]   LABS: Labs reviewed: Acceptable for surgery. (all labs ordered are listed, but only abnormal results are displayed)  Labs Reviewed  GLUCOSE, CAPILLARY - Abnormal; Notable for the following components:      Result Value   Glucose-Capillary 454 (*)    All other components within normal limits     IMAGES:   EKG:   CV:  Past Medical History:  Diagnosis Date   Arthritis    Chest pain    Diabetes mellitus    Gout    History of kidney stones    Hyperlipidemia    Hypertension    Obesity     Past Surgical History:  Procedure Laterality Date   AMPUTATION TOE Right 01/23/2021   Procedure: AMPUTATION OF HALLUX;  Surgeon: Trula Slade, DPM;  Location: [facility];  Service: Podiatry;  Laterality: Right;   UMBILICAL HERNIA REPAIR      MEDICATIONS:  amoxicillin-clavulanate (AUGMENTIN) 875-125 MG tablet   atorvastatin (LIPITOR) 80 MG tablet   atorvastatin (LIPITOR) 80 MG tablet   Blood Glucose Monitoring Suppl (BLOOD GLUCOSE MONITOR SYSTEM) w/Device KIT   ciprofloxacin (CIPRO) 500 MG tablet   colchicine 0.6 MG tablet   Continuous Blood Gluc Receiver (DEXCOM G7 RECEIVER) DEVI   Continuous Blood Gluc Sensor (DEXCOM G7  SENSOR) MISC   COVID-19 At Home Antigen Test (CARESTART COVID-19 HOME TEST) KIT   cyclobenzaprine (FLEXERIL) 10 MG tablet   Dulaglutide (TRULICITY) A999333 0000000 SOPN   Dulaglutide (TRULICITY) 1.5 0000000 SOPN   empagliflozin (JARDIANCE) 25 MG TABS tablet   empagliflozin (JARDIANCE) 25 MG TABS tablet   furosemide (LASIX) 20 MG tablet   gabapentin (NEURONTIN) 300 MG capsule   gentamicin cream (GARAMYCIN) 0.1 %   glucose blood (FREESTYLE LITE) test strip   insulin degludec (TRESIBA FLEXTOUCH) 200 UNIT/ML FlexTouch Pen   insulin degludec (TRESIBA FLEXTOUCH) 200 UNIT/ML FlexTouch Pen   insulin lispro (HUMALOG KWIKPEN) 100 UNIT/ML KwikPen   insulin lispro (HUMALOG KWIKPEN) 100 UNIT/ML KwikPen   Insulin Pen Needle 32G X 6 MM MISC   Lancets (FREESTYLE) lancets   lidocaine (LIDODERM) 5 %   lisinopril (ZESTRIL) 10 MG tablet   lisinopril (ZESTRIL) 10 MG tablet   meloxicam (MOBIC) 15 MG tablet   meloxicam (MOBIC) 15 MG tablet   meloxicam (MOBIC) 15 MG tablet   meloxicam (MOBIC) 15 MG tablet   meloxicam (MOBIC) 15 MG tablet   meloxicam (MOBIC) 15 MG tablet   metFORMIN (GLUCOPHAGE-XR) 500 MG 24 hr tablet   metFORMIN (GLUCOPHAGE-XR) 500 MG 24 hr tablet   mupirocin ointment (BACTROBAN) 2 %   ONETOUCH VERIO test strip  sildenafil (VIAGRA) 100 MG tablet   sildenafil (VIAGRA) 100 MG tablet   triamcinolone (KENALOG) 0.025 % ointment   No current facility-administered medications for this encounter.     Konrad Felix Ward, PA-C [facility] [phone]

## 2022-07-29 ENCOUNTER — Ambulatory Visit (HOSPITAL_COMMUNITY)
Admission: RE | Admit: 2022-07-29 | Discharge: 2022-07-29 | Disposition: A | Discharge status: Home / Self Care | Payer: Commercial Managed Care - PPO | Attending: Podiatry | Admitting: Podiatry

## 2022-07-29 ENCOUNTER — Other Ambulatory Visit: Payer: Self-pay | Admitting: Podiatry

## 2022-07-29 ENCOUNTER — Other Ambulatory Visit (HOSPITAL_COMMUNITY): Payer: Self-pay

## 2022-07-29 ENCOUNTER — Other Ambulatory Visit: Payer: Self-pay

## 2022-07-29 ENCOUNTER — Encounter (HOSPITAL_COMMUNITY): Payer: Self-pay | Admitting: Podiatry

## 2022-07-29 ENCOUNTER — Ambulatory Visit (HOSPITAL_COMMUNITY): Payer: Commercial Managed Care - PPO

## 2022-07-29 ENCOUNTER — Ambulatory Visit (HOSPITAL_COMMUNITY): Payer: Commercial Managed Care - PPO | Admitting: Physician Assistant

## 2022-07-29 ENCOUNTER — Ambulatory Visit (HOSPITAL_BASED_OUTPATIENT_CLINIC_OR_DEPARTMENT_OTHER): Payer: Commercial Managed Care - PPO | Admitting: Anesthesiology

## 2022-07-29 ENCOUNTER — Encounter (HOSPITAL_COMMUNITY)
Admission: RE | Disposition: A | Discharge status: Home / Self Care | Payer: Self-pay | Source: Home / Self Care | Attending: Podiatry

## 2022-07-29 DIAGNOSIS — M86172 Other acute osteomyelitis, left ankle and foot: Secondary | ICD-10-CM | POA: Insufficient documentation

## 2022-07-29 DIAGNOSIS — E1165 Type 2 diabetes mellitus with hyperglycemia: Secondary | ICD-10-CM | POA: Insufficient documentation

## 2022-07-29 DIAGNOSIS — Z794 Long term (current) use of insulin: Secondary | ICD-10-CM | POA: Diagnosis not present

## 2022-07-29 DIAGNOSIS — L97529 Non-pressure chronic ulcer of other part of left foot with unspecified severity: Secondary | ICD-10-CM | POA: Insufficient documentation

## 2022-07-29 DIAGNOSIS — E11621 Type 2 diabetes mellitus with foot ulcer: Secondary | ICD-10-CM

## 2022-07-29 DIAGNOSIS — Z7984 Long term (current) use of oral hypoglycemic drugs: Secondary | ICD-10-CM

## 2022-07-29 DIAGNOSIS — M86672 Other chronic osteomyelitis, left ankle and foot: Secondary | ICD-10-CM | POA: Diagnosis not present

## 2022-07-29 DIAGNOSIS — I10 Essential (primary) hypertension: Secondary | ICD-10-CM | POA: Insufficient documentation

## 2022-07-29 DIAGNOSIS — E1140 Type 2 diabetes mellitus with diabetic neuropathy, unspecified: Secondary | ICD-10-CM | POA: Diagnosis not present

## 2022-07-29 DIAGNOSIS — E1169 Type 2 diabetes mellitus with other specified complication: Secondary | ICD-10-CM

## 2022-07-29 DIAGNOSIS — W540XXA Bitten by dog, initial encounter: Secondary | ICD-10-CM | POA: Insufficient documentation

## 2022-07-29 DIAGNOSIS — S98112A Complete traumatic amputation of left great toe, initial encounter: Secondary | ICD-10-CM | POA: Diagnosis not present

## 2022-07-29 DIAGNOSIS — Z89412 Acquired absence of left great toe: Secondary | ICD-10-CM | POA: Diagnosis not present

## 2022-07-29 HISTORY — PX: AMPUTATION TOE: SHX6595

## 2022-07-29 LAB — GLUCOSE, CAPILLARY
Glucose-Capillary: 177 mg/dL — ABNORMAL HIGH (ref 70–99)
Glucose-Capillary: 177 mg/dL — ABNORMAL HIGH (ref 70–99)

## 2022-07-29 SURGERY — AMPUTATION, TOE
Anesthesia: Monitor Anesthesia Care | Site: Toe | Laterality: Left

## 2022-07-29 MED ORDER — AMOXICILLIN-POT CLAVULANATE 875-125 MG PO TABS
1.0000 | ORAL_TABLET | Freq: Two times a day (BID) | ORAL | 0 refills | Status: DC
  Filled 2022-07-29 (×2): qty 20, 10d supply, fill #0

## 2022-07-29 MED ORDER — CHLORHEXIDINE GLUCONATE CLOTH 2 % EX PADS: 6.0000 | MEDICATED_PAD | Freq: Once | CUTANEOUS | Status: DC

## 2022-07-29 MED ORDER — BUPIVACAINE HCL (PF) 0.25 % IJ SOLN
INTRAMUSCULAR | Status: AC
  Filled 2022-07-29: qty 30

## 2022-07-29 MED ORDER — OXYCODONE HCL 5 MG/5ML PO SOLN: 5.0000 mg | Freq: Once | ORAL | Status: DC | PRN

## 2022-07-29 MED ORDER — PROPOFOL 500 MG/50ML IV EMUL
INTRAVENOUS | Status: AC
  Filled 2022-07-29: qty 50

## 2022-07-29 MED ORDER — PROPOFOL 500 MG/50ML IV EMUL
INTRAVENOUS | Status: DC | PRN
  Administered 2022-07-29: 50 ug/kg/min via INTRAVENOUS

## 2022-07-29 MED ORDER — ORAL CARE MOUTH RINSE: 15.0000 mL | Freq: Once | OROMUCOSAL | Status: AC

## 2022-07-29 MED ORDER — CEFAZOLIN SODIUM-DEXTROSE 2-4 GM/100ML-% IV SOLN
2.0000 g | INTRAVENOUS | Status: AC
  Administered 2022-07-29: 2 g via INTRAVENOUS
  Filled 2022-07-29: qty 100

## 2022-07-29 MED ORDER — MIDAZOLAM HCL 2 MG/2ML IJ SOLN
INTRAMUSCULAR | Status: AC
  Filled 2022-07-29: qty 2

## 2022-07-29 MED ORDER — LIDOCAINE HCL 1 % IJ SOLN
INTRAMUSCULAR | Status: DC | PRN
  Administered 2022-07-29: 5 mL

## 2022-07-29 MED ORDER — CHLORHEXIDINE GLUCONATE 0.12 % MT SOLN
15.0000 mL | Freq: Once | OROMUCOSAL | Status: AC
  Administered 2022-07-29: 15 mL via OROMUCOSAL

## 2022-07-29 MED ORDER — LIDOCAINE-EPINEPHRINE 2 %-1:100000 IJ SOLN
INTRAMUSCULAR | Status: DC | PRN
  Administered 2022-07-29: 20 mL

## 2022-07-29 MED ORDER — MIDAZOLAM HCL 2 MG/2ML IJ SOLN
1.0000 mg | INTRAMUSCULAR | Status: DC
  Administered 2022-07-29: 2 mg via INTRAVENOUS
  Filled 2022-07-29: qty 2

## 2022-07-29 MED ORDER — LIDOCAINE HCL (PF) 1 % IJ SOLN
INTRAMUSCULAR | Status: AC
  Filled 2022-07-29: qty 30

## 2022-07-29 MED ORDER — 0.9 % SODIUM CHLORIDE (POUR BTL) OPTIME
TOPICAL | Status: DC | PRN
  Administered 2022-07-29: 1000 mL

## 2022-07-29 MED ORDER — OXYCODONE HCL 5 MG PO TABS: 5.0000 mg | ORAL_TABLET | Freq: Once | ORAL | Status: DC | PRN

## 2022-07-29 MED ORDER — FENTANYL CITRATE PF 50 MCG/ML IJ SOSY
50.0000 ug | PREFILLED_SYRINGE | INTRAMUSCULAR | Status: DC
  Administered 2022-07-29: 50 ug via INTRAVENOUS
  Filled 2022-07-29: qty 2

## 2022-07-29 MED ORDER — PROMETHAZINE HCL 25 MG/ML IJ SOLN: 6.2500 mg | INTRAMUSCULAR | Status: DC | PRN

## 2022-07-29 MED ORDER — LACTATED RINGERS IV SOLN: INTRAVENOUS | Status: DC

## 2022-07-29 MED ORDER — PHENYLEPHRINE 80 MCG/ML (10ML) SYRINGE FOR IV PUSH (FOR BLOOD PRESSURE SUPPORT)
PREFILLED_SYRINGE | INTRAVENOUS | Status: DC | PRN
  Administered 2022-07-29: 120 ug via INTRAVENOUS

## 2022-07-29 MED ORDER — ACETAMINOPHEN 500 MG PO TABS
1000.0000 mg | ORAL_TABLET | Freq: Once | ORAL | Status: AC
  Administered 2022-07-29: 1000 mg via ORAL
  Filled 2022-07-29: qty 2

## 2022-07-29 MED ORDER — FENTANYL CITRATE PF 50 MCG/ML IJ SOSY: 25.0000 ug | PREFILLED_SYRINGE | INTRAMUSCULAR | Status: DC | PRN

## 2022-07-29 MED ORDER — HYDROCODONE-ACETAMINOPHEN 5-325 MG PO TABS
1.0000 | ORAL_TABLET | Freq: Four times a day (QID) | ORAL | 0 refills | Status: DC | PRN
  Filled 2022-07-29: qty 15, 2d supply, fill #0

## 2022-07-29 MED ORDER — BUPIVACAINE HCL (PF) 0.25 % IJ SOLN
INTRAMUSCULAR | Status: DC | PRN
  Administered 2022-07-29: 5 mL

## 2022-07-29 MED ORDER — ONDANSETRON HCL 4 MG/2ML IJ SOLN
INTRAMUSCULAR | Status: DC | PRN
  Administered 2022-07-29: 4 mg via INTRAVENOUS

## 2022-07-29 SURGICAL SUPPLY — 37 items
APL PRP STRL LF DISP 70% ISPRP (MISCELLANEOUS) ×1
BAG COUNTER SPONGE SURGICOUNT (BAG) IMPLANT
BAG SPNG CNTER NS LX DISP (BAG)
BLADE HEX COATED 2.75 (ELECTRODE) ×2 IMPLANT
BLADE OSCILLATING/SAGITTAL (BLADE) ×1
BLADE SW THK.38XMED LNG THN (BLADE) ×2 IMPLANT
BNDG CMPR 75X21 PLY HI ABS (MISCELLANEOUS) ×1
BNDG CMPR 9X4 STRL LF SNTH (GAUZE/BANDAGES/DRESSINGS) ×1
BNDG ELASTIC 3X5.8 VLCR STR LF (GAUZE/BANDAGES/DRESSINGS) ×2 IMPLANT
BNDG ELASTIC 4X5.8 VLCR STR LF (GAUZE/BANDAGES/DRESSINGS) IMPLANT
BNDG ESMARK 4X9 LF (GAUZE/BANDAGES/DRESSINGS) ×2 IMPLANT
BNDG GAUZE DERMACEA FLUFF 4 (GAUZE/BANDAGES/DRESSINGS) ×2 IMPLANT
BNDG GZE DERMACEA 4 6PLY (GAUZE/BANDAGES/DRESSINGS) ×1
CHLORAPREP W/TINT 26 (MISCELLANEOUS) ×2 IMPLANT
ELECT REM PT RETURN 15FT ADLT (MISCELLANEOUS) ×2 IMPLANT
GAUZE SPONGE 4X4 12PLY STRL (GAUZE/BANDAGES/DRESSINGS) ×2 IMPLANT
GAUZE STRETCH 2X75IN STRL (MISCELLANEOUS) ×2 IMPLANT
GAUZE XEROFORM 1X8 LF (GAUZE/BANDAGES/DRESSINGS) IMPLANT
GLOVE BIO SURGEON STRL SZ7.5 (GLOVE) ×4 IMPLANT
GLOVE BIOGEL PI IND STRL 7.5 (GLOVE) ×2 IMPLANT
GOWN STRL REUS W/ TWL LRG LVL3 (GOWN DISPOSABLE) ×2 IMPLANT
GOWN STRL REUS W/ TWL XL LVL3 (GOWN DISPOSABLE) ×2 IMPLANT
GOWN STRL REUS W/TWL LRG LVL3 (GOWN DISPOSABLE) ×1
GOWN STRL REUS W/TWL XL LVL3 (GOWN DISPOSABLE) ×1
KIT BASIN OR (CUSTOM PROCEDURE TRAY) ×2 IMPLANT
KIT TURNOVER KIT A (KITS) IMPLANT
NDL HYPO 25X1 1.5 SAFETY (NEEDLE) ×2 IMPLANT
NDL SAFETY ECLIP 18X1.5 (MISCELLANEOUS) IMPLANT
NEEDLE HYPO 25X1 1.5 SAFETY (NEEDLE) ×1 IMPLANT
NS IRRIG 1000ML POUR BTL (IV SOLUTION) IMPLANT
PACK ORTHO EXTREMITY (CUSTOM PROCEDURE TRAY) IMPLANT
PENCIL SMOKE EVACUATOR (MISCELLANEOUS) IMPLANT
SUT ETHILON 3 0 PS 1 (SUTURE) IMPLANT
SUT MNCRL AB 3-0 PS2 18 (SUTURE) ×2 IMPLANT
SUT PROLENE 2 0 SH DA (SUTURE) IMPLANT
SYR 20ML LL LF (SYRINGE) ×2 IMPLANT
UNDERPAD 30X36 HEAVY ABSORB (UNDERPADS AND DIAPERS) ×2 IMPLANT

## 2022-07-29 NOTE — Brief Op Note (Signed)
07/29/2022  1:39 PM  PATIENT:  Martin [name]  57 y.o. male  PRE-OPERATIVE DIAGNOSIS:  Acute osteomyelitis of toe, left  POST-OPERATIVE DIAGNOSIS:  Acute osteomyelitis of toe, left  PROCEDURE:  Procedure(s): AMPUTATION TOE FIRST (Left)  SURGEON:  Surgeon(s) and Role:    Trula Slade, DPM - Primary  PHYSICIAN ASSISTANT:   ASSISTANTS: none   ANESTHESIA:   regional and MAC  EBL:  5 ml   BLOOD ADMINISTERED:none  DRAINS: none   LOCAL MEDICATIONS USED:  MARCAINE   , BUPIVICAINE , and Amount: 10 ml  SPECIMEN:  Source of Specimen:  toe for pathology  DISPOSITION OF SPECIMEN:  PATHOLOGY  COUNTS:  YES  TOURNIQUET:  * Missing tourniquet times found for documented tourniquets in log: [id number] *  DICTATION: .Viviann Spare Dictation  PLAN OF CARE: Discharge to home after PACU  PATIENT DISPOSITION:  PACU - hemodynamically stable.   Delay start of Pharmacological VTE agent (>24hrs) due to surgical blood loss or risk of bleeding: no  Intraoperative findings: S/p partial hallux amputation. Remaining bone appeared viable. No proximal tracking/pus noted. Incision closed without tension.

## 2022-07-29 NOTE — Transfer of Care (Signed)
Immediate Anesthesia Transfer of Care Note  Patient: Martin [name]  Procedure(s) Performed: AMPUTATION TOE FIRST (Left: Toe)  Patient Location: PACU  Anesthesia Type:MAC  Level of Consciousness: awake and patient cooperative  Airway & Oxygen Therapy: Patient Spontanous Breathing and Patient connected to face mask  Post-op Assessment: Report given to RN and Post -op Vital signs reviewed and stable  Post vital signs: Reviewed and stable  Last Vitals:  Vitals Value Taken Time  BP 101/36 07/29/22 1343  Temp    Pulse 72 07/29/22 1344  Resp 15 07/29/22 1344  SpO2 97 % 07/29/22 1344  Vitals shown include unvalidated device data.  Last Pain:  Vitals:   07/29/22 1143  TempSrc: Oral  PainSc: 0-No pain         Complications: No notable events documented.

## 2022-07-29 NOTE — Progress Notes (Signed)
Called patient had to leave VM to arrive at 1015 for 1247 surgery time.  Dr Jacqualyn Posey was available to move ahead

## 2022-07-29 NOTE — Progress Notes (Signed)
Orthopedic Tech Progress Note Patient Details:  Martin [name] 10-15-1965 [id number]  Ortho Devices Type of Ortho Device: Postop shoe/boot Ortho Device/Splint Location: LLE Ortho Device/Splint Interventions: Application   Post Interventions Patient Tolerated: Well  Martin [name] 07/29/2022, 2:39 PM

## 2022-07-29 NOTE — Interval H&P Note (Signed)
History and Physical Interval Note:  07/29/2022 12:58 PM  Martin [name]  has presented today for surgery, with the diagnosis of Acute osteomyelitis of toe, left.  The various methods of treatment have been discussed with the patient and family. After consideration of risks, benefits and other options for treatment, the patient has consented to  Procedure(s): AMPUTATION TOE FIRST (Left) as a surgical intervention.  The patient's history has been reviewed, patient examined, no change in status, stable for surgery.  I have reviewed the patient's chart and labs.  Questions were answered to the patient's satisfaction.     Trula Slade

## 2022-07-29 NOTE — Anesthesia Postprocedure Evaluation (Signed)
Anesthesia Post Note  Patient: Martin [name]  Procedure(s) Performed: AMPUTATION TOE FIRST (Left: Toe)     Patient location during evaluation: PACU Anesthesia Type: MAC Level of consciousness: awake and alert Pain management: pain level controlled Vital Signs Assessment: post-procedure vital signs reviewed and stable Respiratory status: spontaneous breathing, nonlabored ventilation, respiratory function stable and patient connected to nasal cannula oxygen Cardiovascular status: stable and blood pressure returned to baseline Postop Assessment: no apparent nausea or vomiting Anesthetic complications: no  No notable events documented.  Last Vitals:  Vitals:   07/29/22 1355 07/29/22 1400  BP:  126/67  Pulse: 72 68  Resp: 16 15  Temp:  36.5 C  SpO2: 96% 93%    Last Pain:  Vitals:   07/29/22 1400  TempSrc:   PainSc: 0-No pain                 Shanin Szymanowski S

## 2022-07-29 NOTE — Discharge Instructions (Addendum)
LIMIT THE AMOUNT OF TIME YOU ARE ON YOUR FEET. KEEP FOOT ELEVATED AND ONLY AMBULATE FOR SHORT DISTANCES AT HOME  ELEVATE FOOT  RESUME ALL HOME MEDICATIONS AS PRIOR TO SURGERY  SEE WRITTEN INSTRUCTIONS

## 2022-07-29 NOTE — Anesthesia Procedure Notes (Signed)
Anesthesia Regional Block: Ankle block   Pre-Anesthetic Checklist: , timeout performed,  Correct Patient, Correct Site, Correct Laterality,  Correct Procedure, Correct Position, site marked,  Risks and benefits discussed,  Surgical consent,  Pre-op evaluation,  At surgeon's request and post-op pain management  Laterality: Right  Prep: chloraprep       Needles:  Injection technique: Single-shot  Needle Type: Other     Needle Length: 4cm  Needle Gauge: 25     Additional Needles:   Narrative:  Start time: 07/29/2022 12:10 PM End time: 07/29/2022 12:15 PM Injection made incrementally with aspirations every 5 mL.  Performed by: Personally  Anesthesiologist: Myrtie Soman, MD  Additional Notes: Patient tolerated the procedure well without complications

## 2022-07-29 NOTE — Anesthesia Preprocedure Evaluation (Addendum)
Anesthesia Evaluation  Patient identified by MRN, date of birth, ID band Patient awake    Reviewed: Allergy & Precautions, H&P , NPO status , Patient's Chart, lab work & pertinent test results  Airway Mallampati: II  TM Distance: >3 FB Neck ROM: Full    Dental no notable dental hx.    Pulmonary neg pulmonary ROS   Pulmonary exam normal breath sounds clear to auscultation       Cardiovascular hypertension, Normal cardiovascular exam Rhythm:Regular Rate:Normal  HLD   Neuro/Psych negative neurological ROS  negative psych ROS   GI/Hepatic negative GI ROS, Neg liver ROS,,,  Endo/Other  diabetes (Hgb A1c 11.6), Poorly Controlled, Type 2, Oral Hypoglycemic Agents, Insulin Dependent    Renal/GU negative Renal ROS  negative genitourinary   Musculoskeletal negative musculoskeletal ROS (+) Arthritis ,  Acute osteomyelitis of left toe   Abdominal   Peds negative pediatric ROS (+)  Hematology negative hematology ROS (+)   Anesthesia Other Findings Gout  Last Trulicity:  Reproductive/Obstetrics negative OB ROS                             Anesthesia Physical Anesthesia Plan  ASA: 3  Anesthesia Plan: MAC   Post-op Pain Management: Tylenol PO (pre-op)* and Regional block*   Induction: Intravenous  PONV Risk Score and Plan: 1 and Ondansetron and Treatment may vary due to age or medical condition  Airway Management Planned: Natural Airway and Simple Face Mask  Additional Equipment:   Intra-op Plan:   Post-operative Plan:   Informed Consent:      Dental advisory given  Plan Discussed with: CRNA and Surgeon  Anesthesia Plan Comments: (Discussed with patient risks of MAC including, but not limited to, minor pain or discomfort, hearing people in the room, and possible need for backup general anesthesia. Risks for general anesthesia also discussed including, but not limited to, sore throat,  hoarse voice, chipped/damaged teeth, injury to vocal cords, nausea and vomiting, allergic reactions, lung infection, heart attack, stroke, and death. All questions answered. )        Anesthesia Quick Evaluation

## 2022-07-30 ENCOUNTER — Encounter (HOSPITAL_COMMUNITY): Payer: Self-pay | Admitting: Podiatry

## 2022-08-01 ENCOUNTER — Other Ambulatory Visit (HOSPITAL_COMMUNITY): Payer: Self-pay

## 2022-08-01 LAB — SURGICAL PATHOLOGY

## 2022-08-03 NOTE — Op Note (Signed)
PATIENT:  Martin [name]  57 y.o. male   PRE-OPERATIVE DIAGNOSIS:  Acute osteomyelitis of toe, left   POST-OPERATIVE DIAGNOSIS:  Acute osteomyelitis of toe, left   PROCEDURE:  Procedure(s): AMPUTATION TOE FIRST (Left)   SURGEON:  Surgeon(s) and Role:    * Trula Slade, DPM - Primary   PHYSICIAN ASSISTANT:    ASSISTANTS: none    ANESTHESIA:   regional and MAC   EBL:  5 ml    BLOOD ADMINISTERED:none   DRAINS: none    LOCAL MEDICATIONS USED:  MARCAINE   , BUPIVICAINE , and Amount: 10 ml   SPECIMEN:  Source of Specimen:  toe for pathology   DISPOSITION OF SPECIMEN:  PATHOLOGY   COUNTS:  YES   TOURNIQUET:  * Missing tourniquet times found for documented tourniquets in log: [id number] *   DICTATION: .Viviann Spare Dictation   PLAN OF CARE: Discharge to home after PACU   PATIENT DISPOSITION:  PACU - hemodynamically stable.   Delay start of Pharmacological VTE agent (>24hrs) due to surgical blood loss or risk of bleeding: no   Intraoperative findings: S/p partial hallux amputation. Remaining bone appeared viable. No proximal tracking/pus noted. Incision closed without tension.   Indications for surgery: 57 year old male presented to the office with full-thickness ulceration to the left big toe after the injury and a dog bite to his toe.  Send had extensive infection of the toe he was started on antibiotics.  Upon follow-up evaluation there is destruction of the distal phalanx consistent with osteomyelitis at this time recommended amputation of the toe, he is partial.  Alternatives, risks, complications were discussed.  No promises or guarantees were given aspect of the procedure all questions were answered to the best my ability.  Procedure in detail: The patient's with verbally and visually identified by myself and nursing staff and anesthesia staff preoperatively.  He was then transferred to the operating room via stretcher with the surgery took place.  After adequate plane  of anesthesia was obtained and a timeout was performed mixture of lidocaine, Marcaine plain was infiltrated in a digital block fashion.  The left lower extremities and scrubbed prepped and draped normal sterile fashion.  Secondary timeout was performed.  This time a modified racquet shaped incision was planned along the IPJ of the hallux to start.  Review of the infections localized to the distal phalanx.  Incision was made with a 15 blade scalpel from skin to bone and the toe was disarticulated at the level of the IPJ and sent to pathology.  Due to the amount of skin that was resected given the infection I was not able to close the wound today provided to proceed with resection of the proximal pharynx head with a sagittal saw.  The bone was adequately resected.  The remaining bone appeared viable.  Is hard in nature, white in color.  There is no proximal tracking.  I copiously irrigated with saline and hemostasis achieved.  Incision was then closed with nylon without tension.  Xeroform was applied followed by dry sterile dressing.  He was woken from anesthesia and found to tolerate the procedure without any complications.  Transferred to PACU vital signs stable and vascular status intact.

## 2022-08-04 ENCOUNTER — Ambulatory Visit (INDEPENDENT_AMBULATORY_CARE_PROVIDER_SITE_OTHER): Payer: Commercial Managed Care - PPO | Admitting: Podiatry

## 2022-08-04 DIAGNOSIS — E1149 Type 2 diabetes mellitus with other diabetic neurological complication: Secondary | ICD-10-CM | POA: Diagnosis not present

## 2022-08-04 DIAGNOSIS — M86172 Other acute osteomyelitis, left ankle and foot: Secondary | ICD-10-CM

## 2022-08-06 NOTE — Progress Notes (Signed)
Subjective: Chief Complaint  Patient presents with   Routine Post Op    POV # 1 DOS 07/29/22 --- AMPUTATION MPJ 1ST LEFT. Patient denies any pain at this time. Stitches are intact. Dressing was clean and dry.     Martin [name] is a 57 y.o. is seen today in office s/p left partial first ray amputation preformed on July 29, 2022.  States he is doing well.  Some occasional discomfort.  He denies any fevers or chills.  No other concerns today.    Objective: General: No acute distress, AAOx3  DP/PT pulses palpable 2/4, CRT < 3 sec to all digits.  Left foot: Incision is well coapted without any evidence of dehiscence with sutures intact. There is no surrounding erythema, ascending cellulitis, fluctuance, crepitus, malodor, drainage/purulence. There is mild edema around the surgical site. There is no significant pain along the surgical site today.  Hammertoe contractures are present on the lesser digits. No other areas of tenderness to bilateral lower extremities.  No other open lesions or pre-ulcerative lesions.  No pain with calf compression, swelling, warmth, erythema.   Assessment and Plan:  Status post left partial hallux amputation, doing well with no complications   -Treatment options discussed including all alternatives, risks, and complications -Incision appears to be healing well postoperatively.  Antibiotic ointment and dressing applied.  He can keep the dressing clean, dry, intact for now. -Limited weightbearing to make surgical shoe -Discussed needs to monitor the other digits, hammertoe contractures to hopefully avoid any transfer lesions. -Ice/elevation -Pain medication as needed. -Monitor for any clinical signs or symptoms of infection and DVT/PE and directed to call the office immediately should any occur or go to the ER. -Follow-up as scheduled for possible suture removal or sooner if any problems arise. In the meantime, encouraged to call the office with any questions,  concerns, change in symptoms.   Celesta Gentile, DPM

## 2022-08-11 ENCOUNTER — Ambulatory Visit (INDEPENDENT_AMBULATORY_CARE_PROVIDER_SITE_OTHER): Payer: Commercial Managed Care - PPO | Admitting: Podiatry

## 2022-08-11 ENCOUNTER — Telehealth: Payer: Self-pay | Admitting: Podiatry

## 2022-08-11 DIAGNOSIS — M86172 Other acute osteomyelitis, left ankle and foot: Secondary | ICD-10-CM

## 2022-08-11 NOTE — Telephone Encounter (Signed)
Pt wife called forgot to ask dr if pt Martin [name] should keep taking amoxicillin med's and if so he needs a Ref send to pharmacy.   Please and thank you

## 2022-08-11 NOTE — Telephone Encounter (Signed)
Patient is aware and verbalized understanding of information given.

## 2022-08-11 NOTE — Progress Notes (Signed)
Subjective: Chief Complaint  Patient presents with   Routine Post Op    POV # 2 DOS 07/29/22 --- AMPUTATION MPJ 1ST LEFT - Martin [name] PT. Pt states he is doing well. Anxious to have sutures removed. No NV/F/CH.    Martin [name] is a 57 y.o. is seen today in office s/p left partial first ray amputation preformed on July 29, 2022.  He presents today for possible suture removal.  He is doing well.  No pain fever or chills.  Occasional discomfort.    Objective: General: No acute distress, AAOx3  DP/PT pulses palpable 2/4, CRT < 3 sec to all digits.  Left foot: Incision is well coapted without any evidence of dehiscence with sutures intact.  There is still some mild edema present but appears to be improving.  There is no significant cellulitis present.  There is no drainage or pus.  There is no fluctuation or crepitation.  There is no malodor. No pain with calf compression, swelling, warmth, erythema.   Assessment and Plan:  Status post left partial hallux amputation, doing well with no complications   -Treatment options discussed including all alternatives, risks, and complications -X-rays obtained reviewed.  3 views of the foot were obtained.  Status post partial hallux amputation with sharp margins. -Patient appears to be healing well by to leave the majority of sutures intact today.  Antibiotic ointment dressing applied.  Will plan on removing the stitches next appointment.  Continue surgical shoe, elevation and limited weightbearing. -Monitor for any clinical signs or symptoms of infection and directed to call the office immediately should any occur or go to the ER.  No follow-ups on file.  Trula Slade DPM

## 2022-08-14 NOTE — Addendum Note (Signed)
Addended by: Celesta Gentile R on: 08/14/2022 05:29 PM   Modules accepted: Level of Service

## 2022-08-18 ENCOUNTER — Other Ambulatory Visit (HOSPITAL_COMMUNITY): Payer: Self-pay

## 2022-08-18 ENCOUNTER — Ambulatory Visit (INDEPENDENT_AMBULATORY_CARE_PROVIDER_SITE_OTHER): Payer: Commercial Managed Care - PPO | Admitting: Podiatry

## 2022-08-18 ENCOUNTER — Ambulatory Visit (INDEPENDENT_AMBULATORY_CARE_PROVIDER_SITE_OTHER): Payer: Commercial Managed Care - PPO

## 2022-08-18 DIAGNOSIS — Z9889 Other specified postprocedural states: Secondary | ICD-10-CM

## 2022-08-18 DIAGNOSIS — M869 Osteomyelitis, unspecified: Secondary | ICD-10-CM

## 2022-08-18 DIAGNOSIS — E1149 Type 2 diabetes mellitus with other diabetic neurological complication: Secondary | ICD-10-CM | POA: Diagnosis not present

## 2022-08-18 MED ORDER — CEPHALEXIN 500 MG PO CAPS
500.0000 mg | ORAL_CAPSULE | Freq: Three times a day (TID) | ORAL | 0 refills | Status: DC
  Filled 2022-08-18: qty 21, 7d supply, fill #0

## 2022-08-21 NOTE — Progress Notes (Signed)
Subjective: Chief Complaint  Patient presents with   Post-op Follow-up    POV # 3 DOS 07/29/22 --- AMPUTATION MPJ 1ST LEFT    Martin [name] is a 57 y.o. is seen today in office s/p left partial first ray amputation preformed on July 29, 2022.  States he is doing well.  Presents today for suture removal.  He has not had any significant pain.  No fevers or chills.  No injuries.  No other concerns.   Objective: General: No acute distress, AAOx3  DP/PT pulses palpable 2/4, CRT < 3 sec to all digits.  Left foot: Incision is well coapted without any evidence of dehiscence with sutures intact.  Some scabbing is present along the lateral aspect of the incision.  There is some localized edema and mild erythema along the incision.  No ascending cellulitis.  There is no drainage or pus.  There is no fluctuance or crepitation.  There is no malodor.  No pain with calf compression, swelling, warmth, erythema.   Assessment and Plan:  Status post left partial hallux amputation, doing well with no complications   -Treatment options discussed including all alternatives, risks, and complications -X-rays obtained reviewed.  3 views of the foot were obtained.  Status post partial hallux amputation with sharp margins. -Remove the sutures today without complications.  Steri-Strips applied for reinforcement incisions coapted. -Prescribed Keflex given mild erythema. -Continue offloading shoe, limited weightbearing to facilitate healing -Monitor for any clinical signs or symptoms of infection and directed to call the office immediately should any occur or go to the ER.  No follow-ups on file.  Trula Slade DPM

## 2022-08-25 ENCOUNTER — Ambulatory Visit (INDEPENDENT_AMBULATORY_CARE_PROVIDER_SITE_OTHER): Payer: Commercial Managed Care - PPO

## 2022-08-25 ENCOUNTER — Encounter: Payer: Commercial Managed Care - PPO | Admitting: Podiatry

## 2022-08-25 ENCOUNTER — Other Ambulatory Visit (HOSPITAL_COMMUNITY): Payer: Self-pay

## 2022-08-25 ENCOUNTER — Ambulatory Visit (INDEPENDENT_AMBULATORY_CARE_PROVIDER_SITE_OTHER): Payer: Commercial Managed Care - PPO | Admitting: Podiatry

## 2022-08-25 DIAGNOSIS — M2042 Other hammer toe(s) (acquired), left foot: Secondary | ICD-10-CM

## 2022-08-25 DIAGNOSIS — Z9889 Other specified postprocedural states: Secondary | ICD-10-CM

## 2022-08-25 DIAGNOSIS — M86072 Acute hematogenous osteomyelitis, left ankle and foot: Secondary | ICD-10-CM

## 2022-08-25 NOTE — Progress Notes (Signed)
Subjective: Chief Complaint  Patient presents with   Post-op Follow-up    Patient is doing well and healing fine per patient     Martin [name] is a 57 y.o. is seen today in office s/p left partial first ray amputation preformed on July 29, 2022.  States that the big toe has been doing well.  Not had any issues with this.  He wants to proceed with the tenotomy on the second toe has been given the callus and hopefully help prevent infection, ulceration forming.  Denies any fevers or chills.  No other concerns.    Objective: General: No acute distress, AAOx3  DP/PT pulses palpable 2/4, CRT < 3 sec to all digits.  Left foot: Incision is well coapted without any evidence of dehiscence and eschar is forming.  Appears to be healing well.  There is trace edema.  There is no erythema or warmth or any signs of infection.  There is a preulcerative callus to the distal aspect left second toe with hammertoe deformity noted.  There is no open lesions.  No edema, erythema or signs of infection today.   No pain with calf compression, swelling, warmth, erythema.   Assessment and Plan:  Status post left partial hallux amputation, doing well with no complications   -Treatment options discussed including all alternatives, risks, and complications -Amputation site appears to be healing well.  From the standpoint I think he can start to transition to regular shoe however we will proceed with a flexor tenotomy today.  We discussed the procedure as well as postoperative course including alternatives risks and complications.  He wishes to proceed with this.  Consent was signed.  Skin skin with alcohol and 3 mL of lidocaine, Marcaine was infiltrated in a digital block fashion.  Skin was then prepped with Betadine, alcohol.  I then introduced an 18-gauge needle to the plantar aspect of the toe and I utilized this to transect the flexor tendon.  At this time the toe was sitting in a more rectus position.  Hope this  will help take pressure off the distal aspect of the toe.  I irrigated with alcohol.  Antibiotic ointment dressing applied.  He tolerated the procedure well.  Postprocedure instructions discussed.  Monitor for any signs or symptoms of infection.  Return in about 2 weeks (around 09/08/2022) for toe amputation post-op/flexor tenotomy 2nd toe.  Will likely plan on going back to work on 09/11/2022  Trula Slade DPM   -Remove the sutures today without complications.  Steri-Strips applied for reinforcement incisions coapted. -Prescribed Keflex given mild erythema. -Continue offloading shoe, limited weightbearing to facilitate healing -Monitor for any clinical signs or symptoms of infection and directed to call the office immediately should any occur or go to the ER.  No follow-ups on file.  Trula Slade DPM    RTC on 7th

## 2022-08-25 NOTE — Patient Instructions (Signed)
Tomorrow you can remove the bandage and start to wash with soap and water. Dry well. Apply a small amount of antibiotic ointment and a bandage.   Monitor for any signs/symptoms of infection. Call the office immediately if any occur or go directly to the emergency room. Call with any questions/concerns.

## 2022-08-26 ENCOUNTER — Other Ambulatory Visit (HOSPITAL_COMMUNITY): Payer: Self-pay

## 2022-08-26 MED ORDER — TRESIBA FLEXTOUCH 200 UNIT/ML ~~LOC~~ SOPN
100.0000 [IU] | PEN_INJECTOR | Freq: Every day | SUBCUTANEOUS | 3 refills | Status: DC
  Filled 2022-08-26: qty 33, 66d supply, fill #0

## 2022-08-30 DIAGNOSIS — H5211 Myopia, right eye: Secondary | ICD-10-CM | POA: Diagnosis not present

## 2022-08-30 DIAGNOSIS — H35033 Hypertensive retinopathy, bilateral: Secondary | ICD-10-CM | POA: Diagnosis not present

## 2022-08-30 DIAGNOSIS — I10 Essential (primary) hypertension: Secondary | ICD-10-CM | POA: Diagnosis not present

## 2022-08-30 DIAGNOSIS — H524 Presbyopia: Secondary | ICD-10-CM | POA: Diagnosis not present

## 2022-08-30 DIAGNOSIS — H53143 Visual discomfort, bilateral: Secondary | ICD-10-CM | POA: Diagnosis not present

## 2022-08-30 DIAGNOSIS — E119 Type 2 diabetes mellitus without complications: Secondary | ICD-10-CM | POA: Diagnosis not present

## 2022-08-30 DIAGNOSIS — H52223 Regular astigmatism, bilateral: Secondary | ICD-10-CM | POA: Diagnosis not present

## 2022-09-05 ENCOUNTER — Other Ambulatory Visit (HOSPITAL_COMMUNITY): Payer: Self-pay

## 2022-09-09 ENCOUNTER — Encounter: Payer: Self-pay | Admitting: Podiatry

## 2022-09-09 ENCOUNTER — Ambulatory Visit (INDEPENDENT_AMBULATORY_CARE_PROVIDER_SITE_OTHER): Payer: Commercial Managed Care - PPO | Admitting: Podiatry

## 2022-09-09 DIAGNOSIS — M2042 Other hammer toe(s) (acquired), left foot: Secondary | ICD-10-CM

## 2022-09-09 DIAGNOSIS — M86072 Acute hematogenous osteomyelitis, left ankle and foot: Secondary | ICD-10-CM

## 2022-09-11 NOTE — Progress Notes (Signed)
Subjective: Chief Complaint  Patient presents with   Routine Post Op    2 week post-op visit, left 2nd toe flexor tenotomy     Martin [name] is a 57 y.o. is seen today in office s/p left partial first ray amputation preformed on July 29, 2022.  He also presents for follow evaluation after ongoing flexor tenotomy of the second toe.  He states that the toes have healed he is doing great and is ready to go back to work.  No fevers or chills.  No other concerns.    Objective: General: No acute distress, AAOx3  DP/PT pulses palpable 2/4, CRT < 3 sec to all digits.  Left foot: Incision is well coapted without any evidence of dehiscence eschar is forming.  There is minimal callus formation the distal aspect of the second toe without any drainage or pus.  No edema, erythema or signs of infection otherwise.  There is no new ulcerations.  Appears that the procedure site is healed.  No pain.  No pain with calf compression, swelling, warmth, erythema.   Assessment and Plan:  Status post left partial hallux amputation, doing well with no complications   -Treatment options discussed including all alternatives, risks, and complications -Overall appears the procedure sites of healed.  He is back to her regular shoe without any issues.  Medically significant to work on Sunday.  Discussed importance of daily foot inspection monitor for any further skin breakdown or recurrence of ulcerations.  Glucose control.  Return in about 3 months (around 12/09/2022) for diabetic foot exam.  Vivi Barrack DPM

## 2022-10-11 ENCOUNTER — Other Ambulatory Visit (HOSPITAL_COMMUNITY): Payer: Self-pay

## 2022-10-11 MED ORDER — SULFAMETHOXAZOLE-TRIMETHOPRIM 800-160 MG PO TABS
1.0000 | ORAL_TABLET | Freq: Two times a day (BID) | ORAL | 0 refills | Status: DC
  Filled 2022-10-11: qty 14, 7d supply, fill #0

## 2022-10-11 MED ORDER — IBUPROFEN 800 MG PO TABS
800.0000 mg | ORAL_TABLET | Freq: Three times a day (TID) | ORAL | 0 refills | Status: DC | PRN
  Filled 2022-10-11: qty 30, 10d supply, fill #0

## 2022-10-13 ENCOUNTER — Other Ambulatory Visit (HOSPITAL_COMMUNITY): Payer: Self-pay

## 2022-10-13 MED ORDER — HYDROCODONE-ACETAMINOPHEN 5-325 MG PO TABS
1.0000 | ORAL_TABLET | Freq: Four times a day (QID) | ORAL | 0 refills | Status: DC | PRN
  Filled 2022-10-13: qty 20, 3d supply, fill #0

## 2022-10-17 ENCOUNTER — Encounter (HOSPITAL_BASED_OUTPATIENT_CLINIC_OR_DEPARTMENT_OTHER): Payer: Self-pay

## 2022-10-17 ENCOUNTER — Emergency Department (HOSPITAL_BASED_OUTPATIENT_CLINIC_OR_DEPARTMENT_OTHER): Payer: No Typology Code available for payment source

## 2022-10-17 ENCOUNTER — Inpatient Hospital Stay (HOSPITAL_BASED_OUTPATIENT_CLINIC_OR_DEPARTMENT_OTHER)
Admission: EM | Admit: 2022-10-17 | Discharge: 2022-10-20 | DRG: 155 | Disposition: A | Discharge status: Home / Self Care | Payer: No Typology Code available for payment source | Attending: Internal Medicine | Admitting: Internal Medicine

## 2022-10-17 ENCOUNTER — Other Ambulatory Visit: Payer: Self-pay

## 2022-10-17 DIAGNOSIS — Z720 Tobacco use: Secondary | ICD-10-CM

## 2022-10-17 DIAGNOSIS — E1165 Type 2 diabetes mellitus with hyperglycemia: Secondary | ICD-10-CM | POA: Diagnosis not present

## 2022-10-17 DIAGNOSIS — Z6835 Body mass index (BMI) 35.0-35.9, adult: Secondary | ICD-10-CM

## 2022-10-17 DIAGNOSIS — E1142 Type 2 diabetes mellitus with diabetic polyneuropathy: Secondary | ICD-10-CM | POA: Diagnosis not present

## 2022-10-17 DIAGNOSIS — I10 Essential (primary) hypertension: Secondary | ICD-10-CM | POA: Diagnosis not present

## 2022-10-17 DIAGNOSIS — Z7984 Long term (current) use of oral hypoglycemic drugs: Secondary | ICD-10-CM

## 2022-10-17 DIAGNOSIS — Z89412 Acquired absence of left great toe: Secondary | ICD-10-CM

## 2022-10-17 DIAGNOSIS — Z79899 Other long term (current) drug therapy: Secondary | ICD-10-CM

## 2022-10-17 DIAGNOSIS — E785 Hyperlipidemia, unspecified: Secondary | ICD-10-CM | POA: Diagnosis present

## 2022-10-17 DIAGNOSIS — Z7985 Long-term (current) use of injectable non-insulin antidiabetic drugs: Secondary | ICD-10-CM

## 2022-10-17 DIAGNOSIS — L0211 Cutaneous abscess of neck: Secondary | ICD-10-CM | POA: Diagnosis present

## 2022-10-17 DIAGNOSIS — E871 Hypo-osmolality and hyponatremia: Secondary | ICD-10-CM | POA: Diagnosis present

## 2022-10-17 DIAGNOSIS — K113 Abscess of salivary gland: Principal | ICD-10-CM

## 2022-10-17 DIAGNOSIS — Z841 Family history of disorders of kidney and ureter: Secondary | ICD-10-CM

## 2022-10-17 DIAGNOSIS — I152 Hypertension secondary to endocrine disorders: Secondary | ICD-10-CM | POA: Diagnosis present

## 2022-10-17 DIAGNOSIS — M109 Gout, unspecified: Secondary | ICD-10-CM | POA: Diagnosis present

## 2022-10-17 DIAGNOSIS — E1159 Type 2 diabetes mellitus with other circulatory complications: Secondary | ICD-10-CM | POA: Diagnosis present

## 2022-10-17 DIAGNOSIS — Z833 Family history of diabetes mellitus: Secondary | ICD-10-CM

## 2022-10-17 DIAGNOSIS — L089 Local infection of the skin and subcutaneous tissue, unspecified: Secondary | ICD-10-CM

## 2022-10-17 DIAGNOSIS — Z794 Long term (current) use of insulin: Secondary | ICD-10-CM

## 2022-10-17 DIAGNOSIS — Z89421 Acquired absence of other right toe(s): Secondary | ICD-10-CM

## 2022-10-17 DIAGNOSIS — E878 Other disorders of electrolyte and fluid balance, not elsewhere classified: Secondary | ICD-10-CM | POA: Diagnosis present

## 2022-10-17 LAB — CBC WITH DIFFERENTIAL/PLATELET
Abs Immature Granulocytes: 0.11 10*3/uL — ABNORMAL HIGH (ref 0.00–0.07)
Basophils Absolute: 0.1 10*3/uL (ref 0.0–0.1)
Basophils Relative: 1 %
Eosinophils Absolute: 0.5 10*3/uL (ref 0.0–0.5)
Eosinophils Relative: 4 %
HCT: 46.4 % (ref 39.0–52.0)
Hemoglobin: 15.7 g/dL (ref 13.0–17.0)
Immature Granulocytes: 1 %
Lymphocytes Relative: 22 %
Lymphs Abs: 2.4 10*3/uL (ref 0.7–4.0)
MCH: 28.2 pg (ref 26.0–34.0)
MCHC: 33.8 g/dL (ref 30.0–36.0)
MCV: 83.5 fL (ref 80.0–100.0)
Monocytes Absolute: 0.8 10*3/uL (ref 0.1–1.0)
Monocytes Relative: 8 %
Neutro Abs: 6.9 10*3/uL (ref 1.7–7.7)
Neutrophils Relative %: 64 %
Platelets: 254 10*3/uL (ref 150–400)
RBC: 5.56 MIL/uL (ref 4.22–5.81)
RDW: 12.6 % (ref 11.5–15.5)
WBC: 10.7 10*3/uL — ABNORMAL HIGH (ref 4.0–10.5)
nRBC: 0 % (ref 0.0–0.2)

## 2022-10-17 LAB — BASIC METABOLIC PANEL
Anion gap: 11 (ref 5–15)
BUN: 16 mg/dL (ref 6–20)
CO2: 23 mmol/L (ref 22–32)
Calcium: 10 mg/dL (ref 8.9–10.3)
Chloride: 96 mmol/L — ABNORMAL LOW (ref 98–111)
Creatinine, Ser: 0.8 mg/dL (ref 0.61–1.24)
GFR, Estimated: >60 mL/min (ref >60)
Glucose, Bld: 295 mg/dL — ABNORMAL HIGH (ref 70–99)
Potassium: 4.4 mmol/L (ref 3.5–5.1)
Sodium: 130 mmol/L — ABNORMAL LOW (ref 135–145)

## 2022-10-17 LAB — GLUCOSE, CAPILLARY: Glucose-Capillary: 338 mg/dL — ABNORMAL HIGH (ref 70–99)

## 2022-10-17 MED ORDER — ENOXAPARIN SODIUM 40 MG/0.4ML IJ SOSY: 40.0000 mg | PREFILLED_SYRINGE | INTRAMUSCULAR | Status: DC

## 2022-10-17 MED ORDER — TRAZODONE HCL 50 MG PO TABS: 25.0000 mg | ORAL_TABLET | Freq: Every evening | ORAL | Status: DC | PRN

## 2022-10-17 MED ORDER — EMPAGLIFLOZIN 25 MG PO TABS
25.0000 mg | ORAL_TABLET | Freq: Every day | ORAL | Status: DC
  Administered 2022-10-18: 25 mg via ORAL
  Filled 2022-10-17: qty 1

## 2022-10-17 MED ORDER — VANCOMYCIN HCL IN DEXTROSE 1-5 GM/200ML-% IV SOLN: 1000.0000 mg | Freq: Once | INTRAVENOUS | Status: DC

## 2022-10-17 MED ORDER — VANCOMYCIN HCL IN DEXTROSE 1-5 GM/200ML-% IV SOLN
1000.0000 mg | Freq: Once | INTRAVENOUS | Status: AC
  Administered 2022-10-17: 1000 mg via INTRAVENOUS
  Filled 2022-10-17: qty 200

## 2022-10-17 MED ORDER — LISINOPRIL 10 MG PO TABS
10.0000 mg | ORAL_TABLET | Freq: Every day | ORAL | Status: DC
  Administered 2022-10-18 – 2022-10-20 (×3): 10 mg via ORAL
  Filled 2022-10-17 (×3): qty 1

## 2022-10-17 MED ORDER — COLCHICINE 0.6 MG PO TABS: 0.6000 mg | ORAL_TABLET | ORAL | Status: DC | PRN

## 2022-10-17 MED ORDER — HYDROCODONE-ACETAMINOPHEN 5-325 MG PO TABS
1.0000 | ORAL_TABLET | Freq: Four times a day (QID) | ORAL | Status: DC | PRN
  Administered 2022-10-19 (×2): 2 via ORAL
  Filled 2022-10-17 (×2): qty 1
  Filled 2022-10-17: qty 2

## 2022-10-17 MED ORDER — CLINDAMYCIN PHOSPHATE 600 MG/50ML IV SOLN
600.0000 mg | Freq: Once | INTRAVENOUS | Status: AC
  Administered 2022-10-17: 600 mg via INTRAVENOUS
  Filled 2022-10-17: qty 50

## 2022-10-17 MED ORDER — INSULIN GLARGINE-YFGN 100 UNIT/ML ~~LOC~~ SOLN
100.0000 [IU] | Freq: Every day | SUBCUTANEOUS | Status: DC
  Administered 2022-10-18 – 2022-10-20 (×3): 100 [IU] via SUBCUTANEOUS
  Filled 2022-10-17 (×3): qty 1

## 2022-10-17 MED ORDER — VANCOMYCIN HCL 1250 MG/250ML IV SOLN
1250.0000 mg | Freq: Two times a day (BID) | INTRAVENOUS | Status: DC
  Administered 2022-10-18 – 2022-10-19 (×4): 1250 mg via INTRAVENOUS
  Filled 2022-10-17 (×6): qty 250

## 2022-10-17 MED ORDER — ONDANSETRON HCL 4 MG PO TABS: 4.0000 mg | ORAL_TABLET | Freq: Four times a day (QID) | ORAL | Status: DC | PRN

## 2022-10-17 MED ORDER — SODIUM CHLORIDE 0.9 % IV SOLN: INTRAVENOUS | Status: DC

## 2022-10-17 MED ORDER — ATORVASTATIN CALCIUM 80 MG PO TABS
80.0000 mg | ORAL_TABLET | Freq: Every day | ORAL | Status: DC
  Administered 2022-10-17 – 2022-10-19 (×3): 80 mg via ORAL
  Filled 2022-10-17 (×3): qty 1

## 2022-10-17 MED ORDER — MAGNESIUM HYDROXIDE 400 MG/5ML PO SUSP: 30.0000 mL | Freq: Every day | ORAL | Status: DC | PRN

## 2022-10-17 MED ORDER — ACETAMINOPHEN 325 MG PO TABS: 650.0000 mg | ORAL_TABLET | Freq: Four times a day (QID) | ORAL | Status: DC | PRN

## 2022-10-17 MED ORDER — MELOXICAM 7.5 MG PO TABS
15.0000 mg | ORAL_TABLET | Freq: Every day | ORAL | Status: DC
  Administered 2022-10-18 – 2022-10-20 (×3): 15 mg via ORAL
  Filled 2022-10-17 (×3): qty 2

## 2022-10-17 MED ORDER — ACETAMINOPHEN 650 MG RE SUPP: 650.0000 mg | Freq: Four times a day (QID) | RECTAL | Status: DC | PRN

## 2022-10-17 MED ORDER — IOHEXOL 300 MG/ML  SOLN
100.0000 mL | Freq: Once | INTRAMUSCULAR | Status: AC | PRN
  Administered 2022-10-17: 80 mL via INTRAVENOUS

## 2022-10-17 MED ORDER — MORPHINE SULFATE (PF) 2 MG/ML IV SOLN
2.0000 mg | INTRAVENOUS | Status: DC | PRN
  Administered 2022-10-18: 2 mg via INTRAVENOUS
  Filled 2022-10-17: qty 1

## 2022-10-17 MED ORDER — CYCLOBENZAPRINE HCL 10 MG PO TABS: 10.0000 mg | ORAL_TABLET | Freq: Two times a day (BID) | ORAL | Status: DC | PRN

## 2022-10-17 MED ORDER — ONDANSETRON HCL 4 MG/2ML IJ SOLN: 4.0000 mg | Freq: Four times a day (QID) | INTRAMUSCULAR | Status: DC | PRN

## 2022-10-17 NOTE — Progress Notes (Signed)
Received patient from ER, Alert and oriented, in no acute distress, vss, denies any pain at time., Right neck abscess/cellulitis with serosanguinous drainage, erythema at surrounding site, dry gauze applied. Oriented to room and environment, safety/fall prec inittiated, call light/phone within reach

## 2022-10-17 NOTE — ED Provider Notes (Signed)
[place] EMERGENCY DEPARTMENT AT [facility] Provider Note   CSN: [id number] Arrival date & time: 10/17/22  1514     History  Chief Complaint  Patient presents with   Abscess    Martin [name] is a 57 y.o. male who presents with a cc of abscess of the right side of his face.  He has a past medical history of diabetes.  Patient reports that last Monday while he was at work he felt something bite him on the side of his neck.  He developed a large "welt."  He had progressively worsening swelling and went to the emergency department later that night where he was told he had an abscess, placed on Bactrim.  Over the next 12 hours he had severely progressive swelling and induration wrapping around the front of his neck without voice change or difficulty swallowing.  He did not have fever or chills.  He returned to the emergency department at [facility] where he underwent CT soft tissue of the neck which showed cellulitis without evidence of fluid collection.  He has taken very antibiotic throughout the week and has had improvement in the erythema and induration of the anterior part of his neck but has had progressive worsening of the swelling, erythema and discomfort located to the inferior of the right ear and behind the angle of the mandible.  He has noted some serosanguineous drainage from that area.  He was sent by [employer] to a [facility] [address] where he had 2 full syringes of serosanguineous fluid aspirated from the side of his neck.  He was then told he needed to come here for a full incision and drainage.  He is he feels like the pain and erythema has progressively worsened and he has completed his course of Bactrim.   Abscess      Home Medications Prior to Admission medications   Medication Sig Start Date End Date Taking? Authorizing Provider  amoxicillin-clavulanate (AUGMENTIN) 875-125 MG tablet Take 1 tablet by mouth 2 (two) times daily. 07/29/22   Vivi Barrack, DPM  atorvastatin (LIPITOR) 80 MG tablet TAKE 1 TABLET BY MOUTH ONCE A DAY AT BEDTIME Patient not taking: Reported on 07/26/2022 06/04/20 06/04/21  Talmage Coin, MD  atorvastatin (LIPITOR) 80 MG tablet Take 1 tablet (80 mg total) by mouth at bedtime. 11/08/21     Blood Glucose Monitoring Suppl (BLOOD GLUCOSE MONITOR SYSTEM) w/Device KIT Use up to four times daily as directed. 01/25/21   Hughie Closs, MD  cephALEXin (KEFLEX) 500 MG capsule Take 1 capsule (500 mg total) by mouth 3 (three) times daily. 08/18/22   Vivi Barrack, DPM  ciprofloxacin (CIPRO) 500 MG tablet Take 1 tablet (500 mg total) by mouth 2 (two) times daily. 07/22/22   Vivi Barrack, DPM  colchicine 0.6 MG tablet Take 2 tablets once and then 1 tablet 1 hour later once for 1 day for gouty flare-ups. 06/27/22     Continuous Blood Gluc Receiver (DEXCOM G7 RECEIVER) DEVI Use as directed 01/17/22     Continuous Blood Gluc Sensor (DEXCOM G7 SENSOR) MISC Change 1 sensor every 10 days on insulin 3 times daily 01/13/22     COVID-19 At Home Antigen Test [facility] COVID-19 HOME TEST) KIT Use as directed 05/05/21   Driscilla Grammes, RPH  cyclobenzaprine (FLEXERIL) 10 MG tablet Take 1 tablet (10 mg total) by mouth 2 (two) times daily as needed for muscle spasms. 11/08/21   Horton, Clabe Seal, DO  Dulaglutide (  TRULICITY) 0.75 MG/0.5ML SOPN Inject 0.75 mg into the skin once a week. Patient not taking: Reported on 07/26/2022 01/28/21     Dulaglutide (TRULICITY) 1.5 MG/0.5ML SOPN Inject 1.5 mg into the skin once a week. Patient not taking: Reported on 07/26/2022 04/27/21     empagliflozin (JARDIANCE) 25 MG TABS tablet Take 1 tablet (25 mg total) by mouth daily. 09/17/20     empagliflozin (JARDIANCE) 25 MG TABS tablet Take 1 tablet (25 mg total) by mouth daily. 03/29/22     furosemide (LASIX) 20 MG tablet Take 1 tablet (20 mg total) by mouth daily as needed. Patient not taking: Reported on 07/26/2022 01/29/21     gabapentin (NEURONTIN) 300 MG  capsule Take 1 capsule (300 mg total) by mouth 3 (three) times daily as needed. Patient not taking: Reported on 07/26/2022 05/17/20   Terrilee Files, MD  gentamicin cream (GARAMYCIN) 0.1 % Apply 1 application topically 3 (three) times daily. Patient not taking: Reported on 07/26/2022 12/14/20   Felecia Shelling, DPM  glucose blood (FREESTYLE LITE) test strip Use three times daily as directed 03/09/21     HYDROcodone-acetaminophen (NORCO/VICODIN) 5-325 MG tablet Take 1-2 tablets by mouth every 6 (six) hours as needed for Pain for up to 3 days 10/13/22     ibuprofen (ADVIL) 800 MG tablet Take 1 tablet (800 mg total) by mouth every 8 (eight) hours as needed for pain 10/11/22   Arna Snipe, MD  insulin degludec (TRESIBA FLEXTOUCH) 200 UNIT/ML FlexTouch Pen Inject 110 Units into the skin daily. Patient taking differently: Inject 120 Units into the skin daily. 05/17/21     insulin degludec (TRESIBA FLEXTOUCH) 200 UNIT/ML FlexTouch Pen Inject 100 Units into the skin daily. Patient not taking: Reported on 07/26/2022 01/13/22     insulin degludec (TRESIBA FLEXTOUCH) 200 UNIT/ML FlexTouch Pen Inject 100 Units into the skin daily. 08/25/22     insulin lispro (HUMALOG KWIKPEN) 100 UNIT/ML KwikPen Inject 4 Units into the skin daily at the beginning of the largest meal for the day. Expires 28 days after initial use Patient not taking: Reported on 07/26/2022 04/27/21     insulin lispro (HUMALOG KWIKPEN) 100 UNIT/ML KwikPen Inject 4 Units into the skin at beginning of largest meal daily. Patient not taking: Reported on 07/26/2022 01/13/22     Insulin Pen Needle 32G X 6 MM MISC Use to inject insulin 2 times a day for 90 days. 12/24/21   Talmage Coin, MD  Lancets (FREESTYLE) lancets Use three times daily as directed 03/09/21   Talmage Coin, MD  lidocaine (LIDODERM) 5 % Place 1 patch onto the skin daily. Remove & Discard patch within 12 hours or as directed by MD Patient not taking: Reported on 07/26/2022 11/08/21    Horton, Danford Bad M, DO  lisinopril (ZESTRIL) 10 MG tablet TAKE 1 TABLET BY MOUTH ONCE DAILY. Patient not taking: Reported on 07/26/2022 08/21/20 08/21/21  Talmage Coin, MD  lisinopril (ZESTRIL) 10 MG tablet Take 1 tablet (10 mg total) by mouth daily. 02/04/22     meloxicam (MOBIC) 15 MG tablet Take 1 tablet (15 mg total) by mouth daily. Patient not taking: Reported on 07/26/2022 02/15/21     meloxicam (MOBIC) 15 MG tablet Take 1 tablet (15 mg total) by mouth daily. Patient not taking: Reported on 07/26/2022 05/19/21     meloxicam (MOBIC) 15 MG tablet Take 1 tablet (15 mg total) by mouth daily. 06/27/22     metFORMIN (GLUCOPHAGE-XR) 500 MG 24 hr tablet TAKE 2  TABLETS BY MOUTH WITH A MEAL TWICE A DAY. 08/28/20 08/28/21  Talmage Coin, MD  metFORMIN (GLUCOPHAGE-XR) 500 MG 24 hr tablet Take 2 tablets (1,000 mg total) by mouth 2 (two) times daily with a meal. 11/08/21     mupirocin ointment (BACTROBAN) 2 % Apply 1 application topically 2 (two) times daily. Patient not taking: Reported on 07/26/2022 06/15/21   Vivi Barrack, DPM  [facility] VERIO test strip USE AS INSTRUCTED 2-3 X A DAY. 08/07/13   Carlus Pavlov, MD  sildenafil (VIAGRA) 100 MG tablet Take 1 tablet (100 mg total) by mouth up to once daily as needed 12/11/20     sildenafil (VIAGRA) 100 MG tablet Take 1/2-1 tablet (50-100 mg total) by mouth 1 hour before sex for E.D. 04/27/22     sulfamethoxazole-trimethoprim (BACTRIM DS) 800-160 MG tablet Take 1 tablet by mouth 2 (two) times daily for 7 days 10/11/22   Arna Snipe, MD  triamcinolone (KENALOG) 0.025 % ointment Apply 1 application topically 2 (two) times daily. Patient not taking: Reported on 07/26/2022 01/28/21   Vivi Barrack, DPM      Allergies    Patient has no known allergies.    Review of Systems   Review of Systems  Physical Exam Updated Vital Signs BP (!) 158/89 (BP Location: Left Arm)   Pulse 77   Temp 98.1 F (36.7 C) (Oral)   Resp 16   Ht 5\' 11"  (1.803 m)    Wt 114.3 kg   SpO2 99%   BMI 35.15 kg/m  Physical Exam Vitals and nursing note reviewed.  Constitutional:      General: He is not in acute distress.    Appearance: He is well-developed. He is not diaphoretic.  HENT:     Head: Normocephalic and atraumatic.  Eyes:     General: No scleral icterus.    Extraocular Movements: Extraocular movements intact.     Conjunctiva/sclera: Conjunctivae normal.     Pupils: Pupils are equal, round, and reactive to light.  Cardiovascular:     Rate and Rhythm: Normal rate and regular rhythm.     Heart sounds: Normal heart sounds.  Pulmonary:     Effort: Pulmonary effort is normal. No respiratory distress.     Breath sounds: Normal breath sounds.  Abdominal:     Palpations: Abdomen is soft.     Tenderness: There is no abdominal tenderness.  Musculoskeletal:     Cervical back: Normal range of motion and neck supple.  Skin:    General: Skin is warm and dry.  Neurological:     Mental Status: He is alert.  Psychiatric:        Behavior: Behavior normal.        ED Results / Procedures / Treatments   Labs (all labs ordered are listed, but only abnormal results are displayed) Labs Reviewed  CBC WITH DIFFERENTIAL/PLATELET - Abnormal; Notable for the following components:      Result Value   WBC 10.7 (*)    Abs Immature Granulocytes 0.11 (*)    All other components within normal limits  BASIC METABOLIC PANEL - Abnormal; Notable for the following components:   Sodium 130 (*)    Chloride 96 (*)    Glucose, Bld 295 (*)    All other components within normal limits    EKG None  Radiology CT Soft Tissue Neck W Contrast  Result Date: 10/17/2022 CLINICAL DATA:  Soft tissue infection suspected, neck, xray done. Spider bite to right neck/jaw area.  Progressive swelling since. EXAM: CT NECK WITH CONTRAST TECHNIQUE: Multidetector CT imaging of the neck was performed using the standard protocol following the bolus administration of intravenous  contrast. RADIATION DOSE REDUCTION: This exam was performed according to the departmental dose-optimization program which includes automated exposure control, adjustment of the mA and/or kV according to patient size and/or use of iterative reconstruction technique. CONTRAST:  80mL OMNIPAQUE IOHEXOL 300 MG/ML  SOLN COMPARISON:  None available. FINDINGS: Pharynx and larynx: Normal. No mass or swelling. Salivary glands: Peripherally enhancing, gas containing 1.6 x 1.8 cm fluid collection in the subcutaneous fat overlying the right parotid gland with extensive surrounding edema and skin thickening. Mild reactive edema in the underlying parotid gland. The left parotid and bilateral submandibular glands are normal. Thyroid: Normal. Lymph nodes: Mildly prominent right upper cervical lymph nodes, likely reactive. Vascular: Atherosclerotic vascular changes of the left carotid bulb. Limited intracranial: Incidental left middle cranial fossa arachnoid cyst. Visualized orbits: Unremarkable. Mastoids and visualized paranasal sinuses: Well aerated. Skeleton: Mild cervical spondylosis without high-grade spinal canal stenosis. Upper chest: Unremarkable. Other: None. IMPRESSION: 1. Peripherally enhancing, gas containing fluid collection overlying the right parotid gland with extensive surrounding edema, measuring up to 1.8 cm and concerning for abscess. 2. Mild reactive edema in the underlying right parotid gland. 3. Mildly prominent right upper cervical lymph nodes, likely reactive. Electronically Signed   By: Orvan Falconer M.D.   On: 10/17/2022 18:37    Procedures Procedures    Medications Ordered in ED Medications  clindamycin (CLEOCIN) IVPB 600 mg (has no administration in time range)  iohexol (OMNIPAQUE) 300 MG/ML solution 100 mL (80 mLs Intravenous Contrast Given 10/17/22 1808)    ED Course/ Medical Decision Making/ A&P Clinical Course as of 10/17/22 1852  Mon Oct 17, 2022  1713 This is a 57 year old man  presented to the ED with complaint of soft tissue pain and swelling in the right side of his neck.  He reports approxi-1 week ago he felt a pinch that he may have been stung by an insect or bit by an insect.  He was seen in the ED and treated with a round of IV vancomycin and discharged on Bactrim which she has been taken for 7 days.  He reports that the swelling and pain near the bite site has worsened, although overall the swelling in his neck is gone down.  He denies fevers at home.  He has been seen in the [employer]. clinic where he reportedly had a needle aspiration of some serous type fluid from his neck earlier.  Here in the ED the patient is afebrile, normal heart rate, hypertensive.  He continues to have a red, tender, and indurated, but nonfluctuant region of erythema and on the right side of the neck, see media photos.  There is potentially a central small spot of necrosis.  Bedside ultrasound does not show a large, drainable fluid collection.  Plan to repeat patient's blood work as well as CT imaging to evaluate for potential extension of infection into the deeper structures of the neck.  Patient has no difficulty swallowing.  No tongue elevation or brawny edema of the lower mandible to suggest Ludwig's angina. [MT]  1748 Sodium(!): 130 [AH]  1749 Glucose(!): 295 [AH]  1749 Creatinine: 0.80 [AH]  1749 WBC(!): 10.7 [AH]  1851 Basic metabolic panel(!) [AH]  1851 CBC with Differential(!) Patient mildly elevated white blood cell count, BMP with low sodium in the setting of glucose of 295. [AH]  1851 CT  Soft Tissue Neck W Contrast I visualized and interpreted CT soft tissue of the neck.  Suspect abscess.  Signout given to Dr. Renaye Rakers at shift change for consultation with ENT. [AH]    Clinical Course User Index [AH] Arthor Captain, PA-C [MT] Renaye Rakers Kermit Balo, MD                             Medical Decision Making Amount and/or Complexity of Data Reviewed Labs: ordered. Decision-making  details documented in ED Course. Radiology: ordered. Decision-making details documented in ED Course.  Risk Prescription drug management.           Final Clinical Impression(s) / ED Diagnoses Final diagnoses:  None    Rx / DC Orders ED Discharge Orders     None         Arthor Captain, PA-C 10/17/22 1852    Terald Sleeper, MD 10/17/22 [phone]

## 2022-10-17 NOTE — ED Triage Notes (Signed)
Patient here POV from Home.  Endorses Spider Bite to Right Neck/Jaw Area. Swelling progressively since. Has taken a Course of Antibiotics for Same. Seen again on Wednesday and had Imaging completed and was discharged.   Seen at [facility] today and sent for Evaluation. Some Drainage. No fever.   NAD Noted during Triage. A&Ox4. Gcs 15. Ambulatory.

## 2022-10-17 NOTE — ED Notes (Signed)
Report called to Windell Moulding, RN at [facility]

## 2022-10-17 NOTE — H&P (Incomplete)
[place]   PATIENT NAME: Martin [name]    MR#:  [id number]  DATE OF BIRTH:  09-04-1965  DATE OF ADMISSION:  10/17/2022  PRIMARY CARE PHYSICIAN: [facility]   Patient is coming from: Home  REQUESTING/REFERRING PHYSICIAN: Arthor Captain, PA-C ([facility])  CHIEF COMPLAINT:   Chief Complaint  Patient presents with   Abscess    HISTORY OF PRESENT ILLNESS:  Martin [name] is a 57 y.o. Caucasian male with medical history significant for type 2 diabetes mellitus, hypertension, dyslipidemia, gout and osteoarthritis who presented to the emergency room with acute onset of worsening right neck pain, erythema and swelling since Monday.  He believes that he felt an insect bite prior to that but has not seen.  It has been initially solid but later on started to soften.  He was seen on outpatient basis and had drainage when it started to open with purulent fluid filled couple of syringes.  He was given antibiotic that initially helped the swelling however the it continued to hurt and only his erythema improved.  No fever or chills.  No nausea or vomiting or abdominal pain.  No dysuria, oliguria or hematuria or flank pain.  ED Course: Upon presentation to the emergency room, BP was 169/102 with otherwise normal vital signs.  Labs revealed mild hyponatremia 1.  He with hypochloremia of 96 and blood glucose of 295 and CBC showed WBC of 10.7.  Imaging: CT soft tissue neck with contrast revealed the following: 1. Peripherally enhancing, gas containing fluid collection overlying the right parotid gland with extensive surrounding edema, measuring up to 1.8 cm and concerning for abscess. 2. Mild reactive edema in the underlying right parotid gland. 3. Mildly prominent right upper cervical lymph nodes, likely reactive.  The patient was given IV clindamycin and later IV vancomycin and will be admitted to an observation medical bed for further evaluation and  management. PAST MEDICAL HISTORY:   Past Medical History:  Diagnosis Date   Arthritis    Chest pain    Diabetes mellitus    Gout    History of kidney stones    Hyperlipidemia    Hypertension    Obesity     PAST SURGICAL HISTORY:   Past Surgical History:  Procedure Laterality Date   AMPUTATION TOE Right 01/23/2021   Procedure: AMPUTATION OF HALLUX;  Surgeon: Vivi Barrack, DPM;  Location: [facility];  Service: Podiatry;  Laterality: Right;   AMPUTATION TOE Left 07/29/2022   Procedure: AMPUTATION TOE FIRST;  Surgeon: Vivi Barrack, DPM;  Location: [facility];  Service: Podiatry;  Laterality: Left;   UMBILICAL HERNIA REPAIR      SOCIAL HISTORY:   Social History   Tobacco Use   Smoking status: Never   Smokeless tobacco: Current    Types: Chew  Substance Use Topics   Alcohol use: Yes    Alcohol/week: 0.0 standard drinks of alcohol    Comment: occ    FAMILY HISTORY:   Family History  Problem Relation Age of Onset   Diabetes Maternal Grandfather    Kidney disease Other     DRUG ALLERGIES:  No Known Allergies  REVIEW OF SYSTEMS:   ROS As per history of present illness. All pertinent systems were reviewed above. Constitutional, HEENT, cardiovascular, respiratory, GI, GU, musculoskeletal, neuro, psychiatric, endocrine, integumentary and hematologic systems were reviewed and are otherwise negative/unremarkable except for positive findings mentioned above in the HPI.   MEDICATIONS AT HOME:  Prior to Admission medications   Medication Sig Start Date End Date Taking? Authorizing Provider  amoxicillin-clavulanate (AUGMENTIN) 875-125 MG tablet Take 1 tablet by mouth 2 (two) times daily. 07/29/22   Vivi Barrack, DPM  atorvastatin (LIPITOR) 80 MG tablet TAKE 1 TABLET BY MOUTH ONCE A DAY AT BEDTIME Patient not taking: Reported on 07/26/2022 06/04/20 06/04/21  Talmage Coin, MD  atorvastatin (LIPITOR) 80 MG tablet Take 1 tablet (80 mg total) by mouth at bedtime.  11/08/21     Blood Glucose Monitoring Suppl (BLOOD GLUCOSE MONITOR SYSTEM) w/Device KIT Use up to four times daily as directed. 01/25/21   Hughie Closs, MD  cephALEXin (KEFLEX) 500 MG capsule Take 1 capsule (500 mg total) by mouth 3 (three) times daily. 08/18/22   Vivi Barrack, DPM  ciprofloxacin (CIPRO) 500 MG tablet Take 1 tablet (500 mg total) by mouth 2 (two) times daily. 07/22/22   Vivi Barrack, DPM  colchicine 0.6 MG tablet Take 2 tablets once and then 1 tablet 1 hour later once for 1 day for gouty flare-ups. 06/27/22     Continuous Blood Gluc Receiver (DEXCOM G7 RECEIVER) DEVI Use as directed 01/17/22     Continuous Blood Gluc Sensor (DEXCOM G7 SENSOR) MISC Change 1 sensor every 10 days on insulin 3 times daily 01/13/22     COVID-19 At Home Antigen Test [facility] COVID-19 HOME TEST) KIT Use as directed 05/05/21   Driscilla Grammes, RPH  cyclobenzaprine (FLEXERIL) 10 MG tablet Take 1 tablet (10 mg total) by mouth 2 (two) times daily as needed for muscle spasms. 11/08/21   Horton, Clabe Seal, DO  Dulaglutide (TRULICITY) 0.75 MG/0.5ML SOPN Inject 0.75 mg into the skin once a week. Patient not taking: Reported on 07/26/2022 01/28/21     Dulaglutide (TRULICITY) 1.5 MG/0.5ML SOPN Inject 1.5 mg into the skin once a week. Patient not taking: Reported on 07/26/2022 04/27/21     empagliflozin (JARDIANCE) 25 MG TABS tablet Take 1 tablet (25 mg total) by mouth daily. 09/17/20     empagliflozin (JARDIANCE) 25 MG TABS tablet Take 1 tablet (25 mg total) by mouth daily. 03/29/22     furosemide (LASIX) 20 MG tablet Take 1 tablet (20 mg total) by mouth daily as needed. Patient not taking: Reported on 07/26/2022 01/29/21     gabapentin (NEURONTIN) 300 MG capsule Take 1 capsule (300 mg total) by mouth 3 (three) times daily as needed. Patient not taking: Reported on 07/26/2022 05/17/20   Terrilee Files, MD  gentamicin cream (GARAMYCIN) 0.1 % Apply 1 application topically 3 (three) times daily. Patient not taking:  Reported on 07/26/2022 12/14/20   Felecia Shelling, DPM  glucose blood (FREESTYLE LITE) test strip Use three times daily as directed 03/09/21     HYDROcodone-acetaminophen (NORCO/VICODIN) 5-325 MG tablet Take 1-2 tablets by mouth every 6 (six) hours as needed for Pain for up to 3 days 10/13/22     ibuprofen (ADVIL) 800 MG tablet Take 1 tablet (800 mg total) by mouth every 8 (eight) hours as needed for pain 10/11/22   Arna Snipe, MD  insulin degludec (TRESIBA FLEXTOUCH) 200 UNIT/ML FlexTouch Pen Inject 110 Units into the skin daily. Patient taking differently: Inject 120 Units into the skin daily. 05/17/21     insulin degludec (TRESIBA FLEXTOUCH) 200 UNIT/ML FlexTouch Pen Inject 100 Units into the skin daily. Patient not taking: Reported on 07/26/2022 01/13/22     insulin degludec (TRESIBA FLEXTOUCH) 200 UNIT/ML FlexTouch Pen Inject 100 Units into  the skin daily. 08/25/22     insulin lispro (HUMALOG KWIKPEN) 100 UNIT/ML KwikPen Inject 4 Units into the skin daily at the beginning of the largest meal for the day. Expires 28 days after initial use Patient not taking: Reported on 07/26/2022 04/27/21     insulin lispro (HUMALOG KWIKPEN) 100 UNIT/ML KwikPen Inject 4 Units into the skin at beginning of largest meal daily. Patient not taking: Reported on 07/26/2022 01/13/22     Insulin Pen Needle 32G X 6 MM MISC Use to inject insulin 2 times a day for 90 days. 12/24/21   Talmage Coin, MD  Lancets (FREESTYLE) lancets Use three times daily as directed 03/09/21   Talmage Coin, MD  lidocaine (LIDODERM) 5 % Place 1 patch onto the skin daily. Remove & Discard patch within 12 hours or as directed by MD Patient not taking: Reported on 07/26/2022 11/08/21   Horton, Danford Bad M, DO  lisinopril (ZESTRIL) 10 MG tablet TAKE 1 TABLET BY MOUTH ONCE DAILY. Patient not taking: Reported on 07/26/2022 08/21/20 08/21/21  Talmage Coin, MD  lisinopril (ZESTRIL) 10 MG tablet Take 1 tablet (10 mg total) by mouth daily. 02/04/22      meloxicam (MOBIC) 15 MG tablet Take 1 tablet (15 mg total) by mouth daily. Patient not taking: Reported on 07/26/2022 02/15/21     meloxicam (MOBIC) 15 MG tablet Take 1 tablet (15 mg total) by mouth daily. Patient not taking: Reported on 07/26/2022 05/19/21     meloxicam (MOBIC) 15 MG tablet Take 1 tablet (15 mg total) by mouth daily. 06/27/22     metFORMIN (GLUCOPHAGE-XR) 500 MG 24 hr tablet TAKE 2 TABLETS BY MOUTH WITH A MEAL TWICE A DAY. 08/28/20 08/28/21  Talmage Coin, MD  metFORMIN (GLUCOPHAGE-XR) 500 MG 24 hr tablet Take 2 tablets (1,000 mg total) by mouth 2 (two) times daily with a meal. 11/08/21     mupirocin ointment (BACTROBAN) 2 % Apply 1 application topically 2 (two) times daily. Patient not taking: Reported on 07/26/2022 06/15/21   Vivi Barrack, DPM  [facility] VERIO test strip USE AS INSTRUCTED 2-3 X A DAY. 08/07/13   Carlus Pavlov, MD  sildenafil (VIAGRA) 100 MG tablet Take 1 tablet (100 mg total) by mouth up to once daily as needed 12/11/20     sildenafil (VIAGRA) 100 MG tablet Take 1/2-1 tablet (50-100 mg total) by mouth 1 hour before sex for E.D. 04/27/22     sulfamethoxazole-trimethoprim (BACTRIM DS) 800-160 MG tablet Take 1 tablet by mouth 2 (two) times daily for 7 days 10/11/22   Arna Snipe, MD  triamcinolone (KENALOG) 0.025 % ointment Apply 1 application topically 2 (two) times daily. Patient not taking: Reported on 07/26/2022 01/28/21   Vivi Barrack, DPM      VITAL SIGNS:  Blood pressure (!) 161/82, pulse 82, temperature 98.2 F (36.8 C), temperature source Oral, resp. rate 18, height 5\' 11"  (1.803 m), weight 114.3 kg, SpO2 98 %.  PHYSICAL EXAMINATION:  Physical Exam  GENERAL:  57 y.o.-year-old Caucasian male patient lying in the bed with no acute distress.  EYES: Pupils equal, round, reactive to light and accommodation. No scleral icterus. Extraocular muscles intact.  HEENT: Head atraumatic, normocephalic. Oropharynx and nasopharynx clear.  NECK:   Supple, no jugular venous distention. No thyroid enlargement, no tenderness.  LUNGS: Normal breath sounds bilaterally, no wheezing, rales,rhonchi or crepitation. No use of accessory muscles of respiration.  CARDIOVASCULAR: Regular rate and rhythm, S1, S2 normal. No murmurs, rubs, or gallops.  ABDOMEN:  Soft, nondistended, nontender. Bowel sounds present. No organomegaly or mass.  EXTREMITIES: No pedal edema, cyanosis, or clubbing.  NEUROLOGIC: Cranial nerves II through XII are intact. Muscle strength 5/5 in all extremities. Sensation intact. Gait not checked.  PSYCHIATRIC: The patient is alert and oriented x 3.  Normal affect and good eye contact. SKIN: Right infra-auricular and parotid swelling with erythema warmth and tenderness with central drainage.     LABORATORY PANEL:   CBC Recent Labs  Lab 10/18/22 0050  WBC 9.8  HGB 14.5  HCT 42.9  PLT 260   ------------------------------------------------------------------------------------------------------------------  Chemistries  Recent Labs  Lab 10/18/22 0050  NA 131*  K 4.1  CL 95*  CO2 23  GLUCOSE 450*  BUN 12  CREATININE 0.85  CALCIUM 9.0   ------------------------------------------------------------------------------------------------------------------  Cardiac Enzymes No results for input(s): "TROPONINI" in the last 168 hours. ------------------------------------------------------------------------------------------------------------------  RADIOLOGY:  CT Soft Tissue Neck W Contrast  Result Date: 10/17/2022 CLINICAL DATA:  Soft tissue infection suspected, neck, xray done. Spider bite to right neck/jaw area. Progressive swelling since. EXAM: CT NECK WITH CONTRAST TECHNIQUE: Multidetector CT imaging of the neck was performed using the standard protocol following the bolus administration of intravenous contrast. RADIATION DOSE REDUCTION: This exam was performed according to the departmental dose-optimization program  which includes automated exposure control, adjustment of the mA and/or kV according to patient size and/or use of iterative reconstruction technique. CONTRAST:  80mL OMNIPAQUE IOHEXOL 300 MG/ML  SOLN COMPARISON:  None available. FINDINGS: Pharynx and larynx: Normal. No mass or swelling. Salivary glands: Peripherally enhancing, gas containing 1.6 x 1.8 cm fluid collection in the subcutaneous fat overlying the right parotid gland with extensive surrounding edema and skin thickening. Mild reactive edema in the underlying parotid gland. The left parotid and bilateral submandibular glands are normal. Thyroid: Normal. Lymph nodes: Mildly prominent right upper cervical lymph nodes, likely reactive. Vascular: Atherosclerotic vascular changes of the left carotid bulb. Limited intracranial: Incidental left middle cranial fossa arachnoid cyst. Visualized orbits: Unremarkable. Mastoids and visualized paranasal sinuses: Well aerated. Skeleton: Mild cervical spondylosis without high-grade spinal canal stenosis. Upper chest: Unremarkable. Other: None. IMPRESSION: 1. Peripherally enhancing, gas containing fluid collection overlying the right parotid gland with extensive surrounding edema, measuring up to 1.8 cm and concerning for abscess. 2. Mild reactive edema in the underlying right parotid gland. 3. Mildly prominent right upper cervical lymph nodes, likely reactive. Electronically Signed   By: Orvan Falconer M.D.   On: 10/17/2022 18:37      IMPRESSION AND PLAN:  Assessment and Plan: * Parotid abscess - The patient will be admitted to a medical-surgical observation bed. - He will be continued on IV vancomycin and will add IV Rocephin. - Warm compresses will be applied. - Pain management will be provided. - ENT consult will be obtained. - Dr. Jearld Fenton was notified and is aware about the patient.  Uncontrolled type 2 diabetes mellitus with hyperglycemia, with long-term current use of insulin (HCC) - We will place him  on frequent fingerstick blood glucose measures with high resistant subcutaneous NovoLog protocol. - We will continue long-acting basal coverage.  Diabetic peripheral neuropathy (HCC) - We will continue Neurontin.  Gout - As needed colchicine will be continued. - The patient has no current acute attacks.    DVT prophylaxis: Lovenox.  Advanced Care Planning:  Code Status: full code.  Family Communication:  The plan of care was discussed in details with the patient (and family). I answered all questions. The patient agreed to proceed with  the above mentioned plan. Further management will depend upon hospital course. Disposition Plan: Back to previous home environment Consults called: ENT consult. All the records are reviewed and case discussed with ED provider.  Status is: Observation   I certify that at the time of admission, it is my clinical judgment that the patient will require hospital care extending less than 2 midnights.                            Dispo: The patient is from: Home              Anticipated d/c is to: Home              Patient currently is not medically stable to d/c.              Difficult to place patient: No  Hannah Beat M.D on 10/18/2022 at 2:49 AM  [facility]   From 7 PM-7 AM, contact night-coverage [email or web address]  CC: Primary care physician; [facility]

## 2022-10-17 NOTE — Progress Notes (Addendum)
Pharmacy Antibiotic Note  Martin [name] is a 57 y.o. male admitted on 10/17/2022 with cellulitis.  Pharmacy has been consulted for vancomycin dosing. Patient presented with 1 cc abscess on right side of face secondary to bug bite. Prescribed Bactrim outpatient, however abscess became larger and indurated with expanding borders. CT with [facility] showed cellulitis without fluid collection. Continued oral antibiotic throughout the week, but has had progressive worsening of the swelling, erythema and discomfort to the inferior of the right ear and behind the angle of the mandible with serosanguineous drainage with 2 full syringes of serosanguineous fluid aspirated from the side of his neck at outpatient clinic. Instructed to return to the ED for abscess drainage.  CrCl 131 ml/min. WBC elevated at 10.7. Afebrile.  Plan: Give vancomycin 2g load Start vancomycin 1250 mg every 12 hours for eAUC of 463 using Scr 0.8 and VD of 0.5. Expecting transfer. If patient remains at [facility], will need regimen changed based on supply. Clindamycin per MD  Height: 5\' 11"  (180.3 cm) Weight: 114.3 kg (252 lb) IBW/kg (Calculated) : 75.3  Temp (24hrs), Avg:98 F (36.7 C), Min:97.8 F (36.6 C), Max:98.1 F (36.7 C)  Recent Labs  Lab 10/17/22 1652  WBC 10.7*  CREATININE 0.80    Estimated Creatinine Clearance: 131 mL/min (by C-G formula based on SCr of 0.8 mg/dL).    No Known Allergies  Antimicrobials this admission: Vanc 5/13 >>   Clindamycin 5/13 >>     Thank you for allowing pharmacy to be a part of this patient's care.  Marygrace Drought 10/17/2022 7:43 PM

## 2022-10-18 ENCOUNTER — Other Ambulatory Visit (HOSPITAL_COMMUNITY): Payer: Self-pay

## 2022-10-18 ENCOUNTER — Telehealth (HOSPITAL_COMMUNITY): Payer: Self-pay | Admitting: Pharmacy Technician

## 2022-10-18 DIAGNOSIS — Z720 Tobacco use: Secondary | ICD-10-CM | POA: Diagnosis not present

## 2022-10-18 DIAGNOSIS — Z89412 Acquired absence of left great toe: Secondary | ICD-10-CM | POA: Diagnosis not present

## 2022-10-18 DIAGNOSIS — L0211 Cutaneous abscess of neck: Secondary | ICD-10-CM | POA: Diagnosis present

## 2022-10-18 DIAGNOSIS — E1142 Type 2 diabetes mellitus with diabetic polyneuropathy: Secondary | ICD-10-CM

## 2022-10-18 DIAGNOSIS — I10 Essential (primary) hypertension: Secondary | ICD-10-CM | POA: Diagnosis not present

## 2022-10-18 DIAGNOSIS — E871 Hypo-osmolality and hyponatremia: Secondary | ICD-10-CM | POA: Diagnosis present

## 2022-10-18 DIAGNOSIS — Z794 Long term (current) use of insulin: Secondary | ICD-10-CM | POA: Diagnosis not present

## 2022-10-18 DIAGNOSIS — Z7984 Long term (current) use of oral hypoglycemic drugs: Secondary | ICD-10-CM | POA: Diagnosis not present

## 2022-10-18 DIAGNOSIS — Z841 Family history of disorders of kidney and ureter: Secondary | ICD-10-CM | POA: Diagnosis not present

## 2022-10-18 DIAGNOSIS — Z6835 Body mass index (BMI) 35.0-35.9, adult: Secondary | ICD-10-CM | POA: Diagnosis not present

## 2022-10-18 DIAGNOSIS — M109 Gout, unspecified: Secondary | ICD-10-CM | POA: Diagnosis present

## 2022-10-18 DIAGNOSIS — E785 Hyperlipidemia, unspecified: Secondary | ICD-10-CM | POA: Diagnosis present

## 2022-10-18 DIAGNOSIS — E878 Other disorders of electrolyte and fluid balance, not elsewhere classified: Secondary | ICD-10-CM | POA: Diagnosis present

## 2022-10-18 DIAGNOSIS — E1165 Type 2 diabetes mellitus with hyperglycemia: Secondary | ICD-10-CM | POA: Diagnosis not present

## 2022-10-18 DIAGNOSIS — Z833 Family history of diabetes mellitus: Secondary | ICD-10-CM | POA: Diagnosis not present

## 2022-10-18 DIAGNOSIS — Z79899 Other long term (current) drug therapy: Secondary | ICD-10-CM | POA: Diagnosis not present

## 2022-10-18 DIAGNOSIS — S1086XA Insect bite of other specified part of neck, initial encounter: Secondary | ICD-10-CM | POA: Diagnosis not present

## 2022-10-18 DIAGNOSIS — K113 Abscess of salivary gland: Secondary | ICD-10-CM

## 2022-10-18 DIAGNOSIS — Z7985 Long-term (current) use of injectable non-insulin antidiabetic drugs: Secondary | ICD-10-CM | POA: Diagnosis not present

## 2022-10-18 DIAGNOSIS — Z89421 Acquired absence of other right toe(s): Secondary | ICD-10-CM | POA: Diagnosis not present

## 2022-10-18 LAB — PROCALCITONIN: Procalcitonin: <0.1 ng/mL

## 2022-10-18 LAB — GLUCOSE, CAPILLARY
Glucose-Capillary: 160 mg/dL — ABNORMAL HIGH (ref 70–99)
Glucose-Capillary: 201 mg/dL — ABNORMAL HIGH (ref 70–99)
Glucose-Capillary: 221 mg/dL — ABNORMAL HIGH (ref 70–99)
Glucose-Capillary: 228 mg/dL — ABNORMAL HIGH (ref 70–99)
Glucose-Capillary: 267 mg/dL — ABNORMAL HIGH (ref 70–99)
Glucose-Capillary: 311 mg/dL — ABNORMAL HIGH (ref 70–99)
Glucose-Capillary: 345 mg/dL — ABNORMAL HIGH (ref 70–99)
Glucose-Capillary: 463 mg/dL — ABNORMAL HIGH (ref 70–99)

## 2022-10-18 LAB — CBC
HCT: 42.9 % (ref 39.0–52.0)
Hemoglobin: 14.5 g/dL (ref 13.0–17.0)
MCH: 28.2 pg (ref 26.0–34.0)
MCHC: 33.8 g/dL (ref 30.0–36.0)
MCV: 83.3 fL (ref 80.0–100.0)
Platelets: 260 10*3/uL (ref 150–400)
RBC: 5.15 MIL/uL (ref 4.22–5.81)
RDW: 12.4 % (ref 11.5–15.5)
WBC: 9.8 10*3/uL (ref 4.0–10.5)
nRBC: 0 % (ref 0.0–0.2)

## 2022-10-18 LAB — BASIC METABOLIC PANEL
Anion gap: 13 (ref 5–15)
BUN: 12 mg/dL (ref 6–20)
CO2: 23 mmol/L (ref 22–32)
Calcium: 9 mg/dL (ref 8.9–10.3)
Chloride: 95 mmol/L — ABNORMAL LOW (ref 98–111)
Creatinine, Ser: 0.85 mg/dL (ref 0.61–1.24)
GFR, Estimated: >60 mL/min (ref >60)
Glucose, Bld: 450 mg/dL — ABNORMAL HIGH (ref 70–99)
Potassium: 4.1 mmol/L (ref 3.5–5.1)
Sodium: 131 mmol/L — ABNORMAL LOW (ref 135–145)

## 2022-10-18 LAB — HEMOGLOBIN A1C
Hgb A1c MFr Bld: 12.3 % — ABNORMAL HIGH (ref 4.8–5.6)
Mean Plasma Glucose: 306.31 mg/dL

## 2022-10-18 LAB — AEROBIC CULTURE W GRAM STAIN (SUPERFICIAL SPECIMEN)

## 2022-10-18 LAB — HIV ANTIBODY (ROUTINE TESTING W REFLEX): HIV Screen 4th Generation wRfx: NONREACTIVE

## 2022-10-18 LAB — BRAIN NATRIURETIC PEPTIDE: B Natriuretic Peptide: 19.2 pg/mL (ref 0.0–100.0)

## 2022-10-18 MED ORDER — INSULIN ASPART 100 UNIT/ML IJ SOLN
0.0000 [IU] | Freq: Every day | INTRAMUSCULAR | Status: DC
  Administered 2022-10-18 – 2022-10-19 (×2): 3 [IU] via SUBCUTANEOUS

## 2022-10-18 MED ORDER — INSULIN ASPART 100 UNIT/ML IJ SOLN
0.0000 [IU] | INTRAMUSCULAR | Status: DC
  Administered 2022-10-18: 4 [IU] via SUBCUTANEOUS
  Administered 2022-10-18: 15 [IU] via SUBCUTANEOUS
  Administered 2022-10-18: 7 [IU] via SUBCUTANEOUS
  Administered 2022-10-18: 20 [IU] via SUBCUTANEOUS

## 2022-10-18 MED ORDER — INSULIN ASPART 100 UNIT/ML IJ SOLN
0.0000 [IU] | Freq: Three times a day (TID) | INTRAMUSCULAR | Status: DC
  Administered 2022-10-18 – 2022-10-19 (×2): 7 [IU] via SUBCUTANEOUS
  Administered 2022-10-19: 4 [IU] via SUBCUTANEOUS
  Administered 2022-10-19: 3 [IU] via SUBCUTANEOUS
  Administered 2022-10-20: 11 [IU] via SUBCUTANEOUS

## 2022-10-18 MED ORDER — SODIUM CHLORIDE 0.9 % IV SOLN
2.0000 g | INTRAVENOUS | Status: DC
  Administered 2022-10-18 – 2022-10-20 (×3): 2 g via INTRAVENOUS
  Filled 2022-10-18 (×3): qty 20

## 2022-10-18 MED ORDER — ENOXAPARIN SODIUM 60 MG/0.6ML IJ SOSY
55.0000 mg | PREFILLED_SYRINGE | Freq: Every day | INTRAMUSCULAR | Status: DC
  Administered 2022-10-18 – 2022-10-20 (×3): 55 mg via SUBCUTANEOUS
  Filled 2022-10-18 (×3): qty 0.6

## 2022-10-18 NOTE — Telephone Encounter (Signed)
Pharmacy Patient Advocate Encounter  Insurance verification completed.    The patient is insured through [employer]   The patient is currently admitted and ran test claims for the following: Dexcom, [employer].  Copays and coinsurance results were relayed to Inpatient clinical team.

## 2022-10-18 NOTE — Assessment & Plan Note (Signed)
-   We will place him on frequent fingerstick blood glucose measures with high resistant subcutaneous NovoLog protocol. - We will continue long-acting basal coverage.

## 2022-10-18 NOTE — TOC Initial Note (Signed)
Transition of Care (TOC) - Initial/Assessment Note   Spoke to patient at bedside. From home with wife. Wife is a Engineer, civil (consulting) who works third shift .   PCP is Dr Andrey Campanile at [facility].   Patient does not want to use TOC Pharmacy at discharge. Prefers [facility]  Patient Details  Name: Martin [name] MRN: [id number] Date of Birth: 07-Jan-1966  Transition of Care [facility]) CM/SW Contact:    Kingsley Plan, RN Phone Number: 10/18/2022, 1:49 PM  Clinical Narrative:                   Expected Discharge Plan: Home/Self Care Barriers to Discharge: Continued Medical Work up   Patient Goals and CMS Choice Patient states their goals for this hospitalization and ongoing recovery are:: to return to home          Expected Discharge Plan and Services   Discharge Planning Services: CM Consult   Living arrangements for the past 2 months: Single Family Home                 DME Arranged: N/A         HH Arranged: NA          Prior Living Arrangements/Services Living arrangements for the past 2 months: Single Family Home Lives with:: Spouse Patient language and need for interpreter reviewed:: Yes Do you feel safe going back to the place where you live?: Yes      Need for Family Participation in Patient Care: Yes (Comment) Care giver support system in place?: Yes (comment)   Criminal Activity/Legal Involvement Pertinent to Current Situation/Hospitalization: No - Comment as needed  Activities of Daily Living Home Assistive Devices/Equipment: None ADL Screening (condition at time of admission) Patient's cognitive ability adequate to safely complete daily activities?: Yes Is the patient deaf or have difficulty hearing?: No Does the patient have difficulty seeing, even when wearing glasses/contacts?: No Does the patient have difficulty concentrating, remembering, or making decisions?: No Patient able to express need for assistance with ADLs?:  No Does the patient have difficulty dressing or bathing?: No Independently performs ADLs?: Yes (appropriate for developmental age) Does the patient have difficulty walking or climbing stairs?: No Weakness of Legs: None Weakness of Arms/Hands: None  Permission Sought/Granted   Permission granted to share information with : No              Emotional Assessment Appearance:: Appears stated age Attitude/Demeanor/Rapport: Engaged Affect (typically observed): Accepting Orientation: : Oriented to Self, Oriented to Place, Oriented to  Time, Oriented to Situation Alcohol / Substance Use: Not Applicable Psych Involvement: No (comment)  Admission diagnosis:  Neck abscess [L02.11] Neck infection [L08.9] Patient Active Problem List   Diagnosis Date Noted   Parotid abscess 10/18/2022   Diabetic peripheral neuropathy (HCC) 10/18/2022   Neck abscess 10/17/2022   Enterococcus faecalis infection 05/03/2021   Open wound of toe 05/03/2021   Health maintenance examination 03/16/2021   Need for vaccination 03/16/2021   Osteomyelitis of great toe of right foot (HCC) 01/20/2021   Acute idiopathic gout of right ankle 12/14/2018   Bilateral hand swelling 01/31/2018   Left leg swelling 01/31/2018   Low grade fever 01/31/2018   Tooth abscess 01/31/2018   Uncontrolled type 2 diabetes mellitus with hyperglycemia, with long-term current use of insulin (HCC) 10/05/2015   Essential hypertension 10/05/2015   Obesity 10/05/2015   Chest pain 12/19/2014   Hypertriglyceridemia 08/20/2012   Gout 07/25/2012   Essential hypertension,  benign 07/25/2012   Other and unspecified hyperlipidemia 07/25/2012   Glaucoma 07/25/2012   PCP:  [facility] Pharmacy:   [facility] [address] [place] [address] Phone: [phone] Fax: [phone]  [id number] - [place], [place] - [address] AT [facility] [address] [place] [address] Phone: [phone] Fax: [phone]  [id number] [place], [place] - [address] [place] [address] Phone: [phone] Fax: [phone]  [id number] [place], [place] - [address] [place] [address] Phone: [phone] Fax: [phone]  [id number] [place], [place] - [address] AT [facility] [address] [place] [address] Phone: [phone] Fax: [phone]  [facility] [address] [place] [address] Phone: [phone] Fax: [phone]     Social Determinants of Health (SDOH) Social History: SDOH Screenings   Depression (PHQ2-9): Low Risk  (05/03/2021)  Tobacco Use: High Risk (10/17/2022)   SDOH Interventions:     Readmission Risk Interventions     No data to display

## 2022-10-18 NOTE — Progress Notes (Signed)
@  01:27 CBG 463,  Dr. Arville Care made aware, orders to give 20 units of insulin and rechecked CBG after 1 hr. After an hour cheched, CBG 345. AT 05:00 am ish, CBG 311, coverage adm per  insulin sliding  scale.

## 2022-10-18 NOTE — Assessment & Plan Note (Signed)
-   We will continue Neurontin. ?

## 2022-10-18 NOTE — Assessment & Plan Note (Signed)
-   We will place him on frequent fingerstick blood glucose measures with high resistant subcutaneous NovoLog protocol. - We will continue long-acting basal coverage. 

## 2022-10-18 NOTE — TOC Benefit Eligibility Note (Signed)
Patient Product/process development scientist completed.    The patient is currently admitted and upon discharge could be taking Dexcom G7 Sensors.  Requires Prior Authorization  The patient is currently admitted and upon discharge could be taking [employer].  Requires Prior Authorization  The patient is insured through [employer]   This test claim was processed through [facility]- copay amounts may vary at other pharmacies due to pharmacy/plan contracts, or as the patient moves through the different stages of their insurance plan.  Roland Earl, CPHT Pharmacy Patient Advocate Specialist [facility] Patient Advocate Team Direct Number: [phone]  Fax: [phone]

## 2022-10-18 NOTE — Telephone Encounter (Signed)
Patient Advocate Encounter   Received notification that prior authorization for Dexcom G7 Sensor is required.   PA submitted on 10/18/2022 [facility] Insurance [employer] ePA Form  Status is pending       Roland Earl, CPhT Pharmacy Patient Advocate Specialist [facility] Patient Advocate Team Direct Number: [phone]  Fax: [phone]

## 2022-10-18 NOTE — Plan of Care (Signed)

## 2022-10-18 NOTE — Telephone Encounter (Signed)
Patient Advocate Encounter  Prior Authorization for [employer]  has been approved.    PA# [id number] Insurance [employer] ePA Form  Effective dates: 10/18/2022 through 10/17/2023  Patients co-pay is $67.82.   [employer] Receiver; please reference authorization [id number]; effective 10/18/2022 through 10/17/2023.  Patients co-pay is $55.46.  Roland Earl, CPhT Pharmacy Patient Advocate Specialist [facility] Patient Advocate Team Direct Number: [phone]  Fax: [phone]

## 2022-10-18 NOTE — Inpatient Diabetes Management (Signed)
Inpatient Diabetes Program Recommendations  AACE/ADA: New Consensus Statement on Inpatient Glycemic Control (2015)  Target Ranges:  Prepandial:   less than 140 mg/dL      Peak postprandial:   less than 180 mg/dL (1-2 hours)      Critically ill patients:  140 - 180 mg/dL   Lab Results  Component Value Date   GLUCAP 221 (H) 10/18/2022   HGBA1C 12.3 (H) 10/18/2022    Review of Glycemic Control  Latest Reference Range & Units 10/17/22 22:50 10/18/22 01:27 10/18/22 02:54 10/18/22 05:24 10/18/22 08:53  Glucose-Capillary 70 - 99 mg/dL 161 (H) 096 (H) 045 (H) 311 (H) 221 (H)   Diabetes history: DM 2 Outpatient Diabetes medications: Tresiba 100 units Daily, Metformin 1000 mg bid, Jardiance 25 mg Daily Current orders for Inpatient glycemic control:  Semglee 100 units Daily Novolog 0-20 units Q4 hours  Endocrinologist: Dr. Sharl Ma A1c 12.3% on 5/14  Spoke with pt and wife at bedside regarding A1c level and glucose control at home. Pt reports he sees Dr. Sharl Ma Outpatient for diabetes management. It has been awhile since he saw Dr. Sharl Ma. Discussed current A1c. Reviewed glucose and A1c goals for home. Encouraged follow up. Discussed diet modifications. Will need to follow glucose trends for tighter control. Pt inquiring about CGM at discharge. Will ask pharmacy to do benefits check.   Thanks,  Christena Deem RN, MSN, BC-ADM Inpatient Diabetes Coordinator Team Pager [phone] (8a-5p)

## 2022-10-18 NOTE — Progress Notes (Signed)
[facility] Progress Note  Patient: Martin [name]    ZOX:[id number]  DOA: 10/17/2022    Date of Service: the patient was seen and examined on 10/18/2022  Brief hospital course: Patient is a 57 year old male with past medical history of diabetes mellitus type 2, hypertension and morbid obesity who presented to the emergency room on 5/13 with complaints of right-sided neck pain along with erythema and swelling.  Patient states issues started a week prior when he thinks he may have been stung or bitten by an insect on the right side of his neck directly underneath his right ear.  Initially there was some hard swelling,, but then area soft and and started developing drainage and so he was seen as an outpatient with purulent fluid obtained feeling a few syringes.  Patient was put on oral Bactrim which led to improvement in erythema, but pain worsened so he came into the emergency room.  In the ED, patient found to have a blood sugar of 295, blood pressure of 169/102.  CT scan of neck noted a gas containing fluid collection overlying the right parotid gland with extensive edema concerning for abscess.  ENT consulted and patient put on IV antibiotics.   Assessment and Plan: * Parotid abscess Patient given 1 dose of IV clindamycin and then started on IV Rocephin and vancomycin.  ENT consulted and status post I&D.  Symptomatic care with warm compresses and medication for pain.  Waiting for cultures  Uncontrolled type 2 diabetes mellitus with hyperglycemia, with long-term current use of insulin (HCC) A1c notes poor control at 12.3 with average blood sugar of 300.  CBGs have actually increased and are 450 this morning.  We had extensive discussion.  While patient endorses compliance with his medications, over the last 3 months, he has been dealing with toe amputation as well as with this abscess, so that is playing somewhat of a role in his elevated sugars.  He also does not check his blood sugars when  he is at work.  Currently on Lantus and sliding scale.  Patient reports losing about 100 pounds over the last year.  He is currently around 255.  Diabetic peripheral neuropathy (HCC) - We will continue Neurontin.  Gout - As needed colchicine will be continued. - The patient has no current acute attacks.  Uncontrolled hypertension: Likely in the setting of acute illness.  Continue home medications plus as needed hydralazine however, given his very poorly controlled diabetes mellitus, concerns for underlying CAD.    Morbid obesity: Meets criteria with BMI greater than 35+ comorbidities of hypertension and diabetes   Body mass index is 35.15 kg/m.        Consultants: ENT  Procedures: None yet  Antimicrobials: IV clindamycin x 1 IV vancomycin 5/13-present IV Rocephin 5/13-present  Code Status: Full code   Subjective: Patient complains of some neck pain although improved  Objective: Mildly elevated blood pressures Vitals:   10/18/22 0517 10/18/22 0851  BP: (!) 154/77 (!) 162/79  Pulse: 84 69  Resp: 18 17  Temp: 98.2 F (36.8 C) 98.1 F (36.7 C)  SpO2: 95% 96%    Intake/Output Summary (Last 24 hours) at 10/18/2022 0950 Last data filed at 10/18/2022 0854 Gross per 24 hour  Intake 698.57 ml  Output --  Net 698.57 ml   Filed Weights   10/17/22 1522  Weight: 114.3 kg   Body mass index is 35.15 kg/m.  Exam:  General: Alert and oriented x 3, no acute distress HEENT: Normocephalic,  status post I&D on right side of neck with wound cover Cardiovascular: Regular rate and rhythm, S1-S2 Respiratory: To auscultation bilaterally Abdomen: Soft, obese, nontender, positive bowel sounds Musculoskeletal: No clubbing or cyanosis, left lower extremity has 1+ pitting edema.  Patient is status post partial amputation of first toe on each foot Skin: Status post I&D on right side of neck. Psychiatry: Appropriate, no evidence of psychoses Neurology: Mild peripheral  neuropathy  Data Reviewed: Blood sugar at 450 this morning, A1c of 12.3  Disposition:  Status is: Inpatient -Awaiting abscess cultures -Stabilization of blood sugars    Anticipated discharge date: 5/17  Family Communication: Will call wife DVT Prophylaxis: enoxaparin (LOVENOX) injection 40 mg Start: 10/18/22 1400    Author: Hollice Espy ,MD 10/18/2022 9:50 AM  To reach On-call, see care teams to locate the attending and reach out via [email or web address]. Between 7PM-7AM, please contact night-coverage If you still have difficulty reaching the attending provider, please page the [facility] (Director on Call) for [facility] on amion for assistance.

## 2022-10-18 NOTE — Hospital Course (Addendum)
Patient is a 57 year old male with past medical history of diabetes mellitus type 2, hypertension and morbid obesity who presented to the emergency room on 5/13 with complaints of right-sided neck pain along with erythema and swelling.  Patient states issues started a week prior when he thinks he may have been stung or bitten by an insect on the right side of his neck directly underneath his right ear.  Initially there was some hard swelling,, but then area soft and and started developing drainage and so he was seen as an outpatient with purulent fluid obtained feeling a few syringes.  Patient was put on oral Bactrim which led to improvement in erythema, but pain worsened so he came into the emergency room.  In the ED, patient found to have a blood sugar of 295, blood pressure of 169/102.  CT scan of neck noted a gas containing fluid collection overlying the right parotid gland with extensive edema concerning for abscess.  Patient started on IV antibiotics.  ENT consulted and did bedside I&D.

## 2022-10-18 NOTE — Assessment & Plan Note (Signed)
-   As needed colchicine will be continued. - The patient has no current acute attacks.

## 2022-10-18 NOTE — Consult Note (Signed)
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  HPI: hx of bite to the right face and now infected. He had aspiration in ERE with no culture sent. He has been on Bactrim without resolution and infact worsened. He has CT scan initially with no abscess and now with 1.8 cm abscess.   Past Medical History:  Diagnosis Date   Arthritis    Chest pain    Diabetes mellitus    Gout    History of kidney stones    Hyperlipidemia    Hypertension    Obesity     Past Surgical History:  Procedure Laterality Date   AMPUTATION TOE Right 01/23/2021   Procedure: AMPUTATION OF HALLUX;  Surgeon: Vivi Barrack, DPM;  Location: [facility];  Service: Podiatry;  Laterality: Right;   AMPUTATION TOE Left 07/29/2022   Procedure: AMPUTATION TOE FIRST;  Surgeon: Vivi Barrack, DPM;  Location: [facility];  Service: Podiatry;  Laterality: Left;   UMBILICAL HERNIA REPAIR      Family History  Problem Relation Age of Onset   Diabetes Maternal Grandfather    Kidney disease Other     Social History:  reports that he has never smoked. His smokeless tobacco use includes chew. He reports current alcohol use. He reports that he does not use drugs.  Allergies: No Known Allergies  Medications: I have reviewed the patient's current medications.  Results for orders placed or performed during the hospital encounter of 10/17/22 (from the past 48 hour(s))  CBC with Differential     Status: Abnormal   Collection Time: 10/17/22  4:52 PM  Result Value Ref Range   WBC 10.7 (H) 4.0 - 10.5 K/uL   RBC 5.56 4.22 - 5.81 MIL/uL   Hemoglobin 15.7 13.0 - 17.0 g/dL   HCT 16.1 09.6 - 04.5 %   MCV 83.5 80.0 - 100.0 fL   MCH 28.2 26.0 - 34.0 pg   MCHC 33.8 30.0 - 36.0 g/dL   RDW 40.9 81.1 - 91.4 %   Platelets 254 150 - 400 K/uL   nRBC 0.0 0.0 - 0.2 %   Neutrophils Relative % 64 %   Neutro Abs 6.9 1.7 - 7.7 K/uL   Lymphocytes Relative 22 %   Lymphs Abs 2.4 0.7 - 4.0 K/uL    Monocytes Relative 8 %   Monocytes Absolute 0.8 0.1 - 1.0 K/uL   Eosinophils Relative 4 %   Eosinophils Absolute 0.5 0.0 - 0.5 K/uL   Basophils Relative 1 %   Basophils Absolute 0.1 0.0 - 0.1 K/uL   Immature Granulocytes 1 %   Abs Immature Granulocytes 0.11 (H) 0.00 - 0.07 K/uL    Comment: Performed at [facility], [address] [place], [place] [address]  Basic metabolic panel     Status: Abnormal   Collection Time: 10/17/22  4:52 PM  Result Value Ref Range   Sodium 130 (L) 135 - 145 mmol/L   Potassium 4.4 3.5 - 5.1 mmol/L   Chloride 96 (L) 98 - 111 mmol/L   CO2 23 22 - 32 mmol/L   Glucose, Bld 295 (H) 70 - 99 mg/dL    Comment: Glucose reference range applies only to samples taken after fasting for at least 8 hours.   BUN 16 6 - 20 mg/dL   Creatinine, Ser 6.21 0.61 - 1.24 mg/dL   Calcium 30.8 8.9 - 65.7 mg/dL   GFR, Estimated >84 >69 mL/min  Comment: (NOTE) Calculated using the CKD-EPI Creatinine Equation (2021)    Anion gap 11 5 - 15    Comment: Performed at [facility], [address] [place], [place] [address]  Glucose, capillary     Status: Abnormal   Collection Time: 10/17/22 10:50 PM  Result Value Ref Range   Glucose-Capillary 338 (H) 70 - 99 mg/dL    Comment: Glucose reference range applies only to samples taken after fasting for at least 8 hours.  CBC     Status: None   Collection Time: 10/18/22 12:50 AM  Result Value Ref Range   WBC 9.8 4.0 - 10.5 K/uL   RBC 5.15 4.22 - 5.81 MIL/uL   Hemoglobin 14.5 13.0 - 17.0 g/dL   HCT 01.0 27.2 - 53.6 %   MCV 83.3 80.0 - 100.0 fL   MCH 28.2 26.0 - 34.0 pg   MCHC 33.8 30.0 - 36.0 g/dL   RDW 64.4 03.4 - 74.2 %   Platelets 260 150 - 400 K/uL   nRBC 0.0 0.0 - 0.2 %    Comment: Performed at [facility], [address] [place], [place] [address]  Hemoglobin A1c     Status: Abnormal   Collection Time: 10/18/22 12:50 AM  Result Value Ref Range   Hgb A1c MFr Bld 12.3 (H) 4.8  - 5.6 %    Comment: (NOTE) Pre diabetes:          5.7%-6.4%  Diabetes:              >6.4%  Glycemic control for   <7.0% adults with diabetes    Mean Plasma Glucose 306.31 mg/dL    Comment: Performed at [facility], [address] [place], [place] [address]  Basic metabolic panel     Status: Abnormal   Collection Time: 10/18/22 12:50 AM  Result Value Ref Range   Sodium 131 (L) 135 - 145 mmol/L   Potassium 4.1 3.5 - 5.1 mmol/L   Chloride 95 (L) 98 - 111 mmol/L   CO2 23 22 - 32 mmol/L   Glucose, Bld 450 (H) 70 - 99 mg/dL    Comment: Glucose reference range applies only to samples taken after fasting for at least 8 hours.   BUN 12 6 - 20 mg/dL   Creatinine, Ser 3.32 0.61 - 1.24 mg/dL   Calcium 9.0 8.9 - 95.1 mg/dL   GFR, Estimated >88 >41 mL/min    Comment: (NOTE) Calculated using the CKD-EPI Creatinine Equation (2021)    Anion gap 13 5 - 15    Comment: Performed at [facility], [address], [place], [place] [address]  HIV Antibody (routine testing w rflx)     Status: None   Collection Time: 10/18/22 12:50 AM  Result Value Ref Range   HIV Screen 4th Generation wRfx Non Reactive Non Reactive    Comment: Performed at [facility], [address], [place], [place] [address]  Glucose, capillary     Status: Abnormal   Collection Time: 10/18/22  1:27 AM  Result Value Ref Range   Glucose-Capillary 463 (H) 70 - 99 mg/dL    Comment: Glucose reference range applies only to samples taken after fasting for at least 8 hours.  Glucose, capillary     Status: Abnormal   Collection Time: 10/18/22  2:54 AM  Result Value Ref Range   Glucose-Capillary 345 (H) 70 - 99 mg/dL    Comment: Glucose reference range applies only  to samples taken after fasting for at least 8 hours.  Glucose, capillary     Status: Abnormal   Collection Time: 10/18/22  5:24 AM  Result Value Ref Range   Glucose-Capillary 311 (H) 70 - 99 mg/dL    Comment: Glucose reference range applies only to  samples taken after fasting for at least 8 hours.  Glucose, capillary     Status: Abnormal   Collection Time: 10/18/22  8:53 AM  Result Value Ref Range   Glucose-Capillary 221 (H) 70 - 99 mg/dL    Comment: Glucose reference range applies only to samples taken after fasting for at least 8 hours.    CT Soft Tissue Neck W Contrast  Result Date: 10/17/2022 CLINICAL DATA:  Soft tissue infection suspected, neck, xray done. Spider bite to right neck/jaw area. Progressive swelling since. EXAM: CT NECK WITH CONTRAST TECHNIQUE: Multidetector CT imaging of the neck was performed using the standard protocol following the bolus administration of intravenous contrast. RADIATION DOSE REDUCTION: This exam was performed according to the departmental dose-optimization program which includes automated exposure control, adjustment of the mA and/or kV according to patient size and/or use of iterative reconstruction technique. CONTRAST:  80mL OMNIPAQUE IOHEXOL 300 MG/ML  SOLN COMPARISON:  None available. FINDINGS: Pharynx and larynx: Normal. No mass or swelling. Salivary glands: Peripherally enhancing, gas containing 1.6 x 1.8 cm fluid collection in the subcutaneous fat overlying the right parotid gland with extensive surrounding edema and skin thickening. Mild reactive edema in the underlying parotid gland. The left parotid and bilateral submandibular glands are normal. Thyroid: Normal. Lymph nodes: Mildly prominent right upper cervical lymph nodes, likely reactive. Vascular: Atherosclerotic vascular changes of the left carotid bulb. Limited intracranial: Incidental left middle cranial fossa arachnoid cyst. Visualized orbits: Unremarkable. Mastoids and visualized paranasal sinuses: Well aerated. Skeleton: Mild cervical spondylosis without high-grade spinal canal stenosis. Upper chest: Unremarkable. Other: None. IMPRESSION: 1. Peripherally enhancing, gas containing fluid collection overlying the right parotid gland with  extensive surrounding edema, measuring up to 1.8 cm and concerning for abscess. 2. Mild reactive edema in the underlying right parotid gland. 3. Mildly prominent right upper cervical lymph nodes, likely reactive. Electronically Signed   By: Orvan Falconer M.D.   On: 10/17/2022 18:37    ROS Blood pressure (!) 162/79, pulse 69, temperature 98.1 F (36.7 C), temperature source Oral, resp. rate 17, height 5\' 11"  (1.803 m), weight 114.3 kg, SpO2 96 %. Physical Exam Constitutional:      Appearance: Normal appearance.  HENT:     Nose: Nose normal.     Mouth/Throat:     Mouth: Mucous membranes are moist.  Eyes:     Pupils: Pupils are equal, round, and reactive to light.  Neck:     Comments: Right infraauricular area with indurated erythema of skin with about 6 cm of induration.   Neurological:     Mental Status: He is alert.   Procedure- he was informed of risks, benefits, and options. All questions answered and consent obtained. The area was injected with 1% lido with epi. 2 cc. Incision made and opened with scissors. There was a large cavity that opened 360 but not much pus. Culture taken. Irrigated with saline copiously. Tolerated well.     Assessment/Plan: Right neck abscess- he most likely has MRSA. The wound was opened and irrigated with saline and culture taken. He should continue the Vancomycin until culture back.   Suzanna Obey 10/18/2022, 11:32 AM

## 2022-10-18 NOTE — Assessment & Plan Note (Addendum)
-   The patient will be admitted to a medical-surgical observation bed. - He will be continued on IV vancomycin and will add IV Rocephin. - Warm compresses will be applied. - Pain management will be provided. - ENT consult will be obtained. - Dr. Jearld Fenton was notified and is aware about the patient.

## 2022-10-19 LAB — BASIC METABOLIC PANEL
Anion gap: 11 (ref 5–15)
BUN: 16 mg/dL (ref 6–20)
CO2: 21 mmol/L — ABNORMAL LOW (ref 22–32)
Calcium: 9.3 mg/dL (ref 8.9–10.3)
Chloride: 102 mmol/L (ref 98–111)
Creatinine, Ser: 0.74 mg/dL (ref 0.61–1.24)
GFR, Estimated: >60 mL/min (ref >60)
Glucose, Bld: 174 mg/dL — ABNORMAL HIGH (ref 70–99)
Potassium: 4 mmol/L (ref 3.5–5.1)
Sodium: 134 mmol/L — ABNORMAL LOW (ref 135–145)

## 2022-10-19 LAB — GLUCOSE, CAPILLARY
Glucose-Capillary: 179 mg/dL — ABNORMAL HIGH (ref 70–99)
Glucose-Capillary: 197 mg/dL — ABNORMAL HIGH (ref 70–99)
Glucose-Capillary: 229 mg/dL — ABNORMAL HIGH (ref 70–99)
Glucose-Capillary: 138 mg/dL — ABNORMAL HIGH (ref 70–99)
Glucose-Capillary: 279 mg/dL — ABNORMAL HIGH (ref 70–99)

## 2022-10-19 LAB — AEROBIC CULTURE W GRAM STAIN (SUPERFICIAL SPECIMEN)

## 2022-10-19 MED ORDER — CYCLOBENZAPRINE HCL 10 MG PO TABS
10.0000 mg | ORAL_TABLET | Freq: Three times a day (TID) | ORAL | Status: DC
  Administered 2022-10-19 – 2022-10-20 (×3): 10 mg via ORAL
  Filled 2022-10-19 (×3): qty 1

## 2022-10-19 MED ORDER — GABAPENTIN 300 MG PO CAPS: 300.0000 mg | ORAL_CAPSULE | Freq: Three times a day (TID) | ORAL | Status: DC

## 2022-10-19 MED ORDER — MUSCLE RUB 10-15 % EX CREA
TOPICAL_CREAM | CUTANEOUS | Status: DC | PRN
  Filled 2022-10-19: qty 85

## 2022-10-19 MED ORDER — EMPAGLIFLOZIN 25 MG PO TABS
25.0000 mg | ORAL_TABLET | Freq: Every day | ORAL | Status: DC
  Administered 2022-10-19 – 2022-10-20 (×2): 25 mg via ORAL
  Filled 2022-10-19 (×2): qty 1

## 2022-10-19 NOTE — Inpatient Diabetes Management (Signed)
Inpatient Diabetes Program Recommendations  AACE/ADA: New Consensus Statement on Inpatient Glycemic Control (2015)  Target Ranges:  Prepandial:   less than 140 mg/dL      Peak postprandial:   less than 180 mg/dL (1-2 hours)      Critically ill patients:  140 - 180 mg/dL   Lab Results  Component Value Date   GLUCAP 197 (H) 10/19/2022   HGBA1C 12.3 (H) 10/18/2022    Review of Glycemic Control  Latest Reference Range & Units 10/18/22 05:24 10/18/22 08:53 10/18/22 12:45 10/18/22 15:57 10/18/22 21:20 10/18/22 23:30 10/19/22 03:43 10/19/22 08:20  Glucose-Capillary 70 - 99 mg/dL 161 (H) 096 (H) 045 (H) 201 (H) 267 (H) 228 (H) 179 (H) 197 (H)   Diabetes history: DM 2 Outpatient Diabetes medications: Tresiba 100 units Daily, Metformin 1000 mg bid, Jardiance 25 mg Daily Current orders for Inpatient glycemic control:  Semglee 100 units Daily Novolog 0-20 units Q4 hours  Endocrinologist: Dr. Sharl Ma A1c 12.3% on 5/14  -   Increase insulin to Semglee 55 units bid  Thanks,  Christena Deem RN, MSN, BC-ADM Inpatient Diabetes Coordinator Team Pager [phone] (8a-5p)

## 2022-10-19 NOTE — Progress Notes (Signed)
[facility] Progress Note Patient: Martin [name] [id number] DOB: [birth year] DOA: 10/17/2022  DOS: the patient was seen and examined on 10/19/2022  Brief hospital course: PMH of HTN, obesity, type II DM presented to hospital with complaints of right-sided neck pain. Went to [facility] ED with complaints of insect bite on 5/7 as well as 5/8.  Was started on Bactrim. CT scan of the neck here on 5/13 shows a gas containing fluid collection overlying the right parotid gland with extensive edema concerning for abscess.  Patient started on IV antibiotics.  ENT consulted and did bedside I&D. Assessment and Plan: Right neck abscess. Treated with Bactrim outpatient. Failure and therefore came back to the ER. Underwent bedside I&D on 5/14. Cultures are sent.  Growing abundant Staph aureus. Sensitivity currently pending. On IV vancomycin already improving. Monitor.  Diabetes mellitus, uncontrolled with hyperglycemia with long-term insulin use with neuropathy complication. A1c more than 12. CBC is significantly elevated. Patient will be switching his endocrinologist per his discussion. Patient has lost significant amount of weight. Currently not taking any GLP-1 analogs. Continuing insulin therapy for now. Monitor weight  Diabetic neuropathy. Continue Neurontin.  HTN. Uncontrolled. Blood pressure elevated.  Will continue current regimen.  Obesity Body mass index is 35.15 kg/m.  Placing the pt at higher risk of poor outcomes.   Subjective: Pain well-controlled.  No nausea no vomiting no fever no chills.  Physical Exam: General: in Mild distress, No Rash, right side of the neck swollen but improving per patient. Cardiovascular: S1 and S2 Present, No Murmur Respiratory: Good respiratory effort, Bilateral Air entry present. No Crackles, No wheezes Abdomen: Bowel Sound present, No tenderness Extremities: No edema Neuro: Alert and oriented x3, no new focal deficit  Data  Reviewed: I have Reviewed nursing notes, Vitals, and Lab results. Since last encounter, pertinent lab results CBC and BMP   . I have ordered test including CBC and BMP  .   Disposition: Status is: Inpatient Remains inpatient appropriate because: Need for IV antibiotics  Family Communication: No one at bedside Level of care: Med-Surg   Vitals:   10/18/22 2006 10/19/22 0341 10/19/22 0817 10/19/22 1549  BP: 124/73 (!) 150/95 (!) 142/83 128/85  Pulse: 82 80 74 64  Resp: 18 18 17 17   Temp: 98.3 F (36.8 C) 98.1 F (36.7 C) 98.1 F (36.7 C) 98.1 F (36.7 C)  TempSrc: Oral Oral Oral Oral  SpO2: 94% 96% 99% 98%  Weight:      Height:         Author: Lynden Oxford, MD 10/19/2022 7:53 PM  Please look on [email or web address] to find out who is on call.

## 2022-10-19 NOTE — Progress Notes (Signed)
Patient ID: Martin [name], male   DOB: 03/31/1966, 57 y.o.   MRN: [id number]  He is substantially better. No pain. The induration is slightly better but erythema is definitely better.  He could go home if we knew what po antibiotic. He would be best to see what is growing and sensitivity. He will wait until tomorrow.

## 2022-10-19 NOTE — Plan of Care (Signed)

## 2022-10-20 ENCOUNTER — Other Ambulatory Visit (HOSPITAL_COMMUNITY): Payer: Self-pay

## 2022-10-20 ENCOUNTER — Other Ambulatory Visit (HOSPITAL_BASED_OUTPATIENT_CLINIC_OR_DEPARTMENT_OTHER): Payer: Self-pay

## 2022-10-20 DIAGNOSIS — K113 Abscess of salivary gland: Secondary | ICD-10-CM | POA: Diagnosis not present

## 2022-10-20 LAB — BASIC METABOLIC PANEL
Anion gap: 12 (ref 5–15)
BUN: 16 mg/dL (ref 6–20)
CO2: 21 mmol/L — ABNORMAL LOW (ref 22–32)
Calcium: 9 mg/dL (ref 8.9–10.3)
Chloride: 100 mmol/L (ref 98–111)
Creatinine, Ser: 0.73 mg/dL (ref 0.61–1.24)
GFR, Estimated: >60 mL/min (ref >60)
Glucose, Bld: 244 mg/dL — ABNORMAL HIGH (ref 70–99)
Potassium: 3.7 mmol/L (ref 3.5–5.1)
Sodium: 133 mmol/L — ABNORMAL LOW (ref 135–145)

## 2022-10-20 LAB — GLUCOSE, CAPILLARY: Glucose-Capillary: 277 mg/dL — ABNORMAL HIGH (ref 70–99)

## 2022-10-20 LAB — AEROBIC CULTURE W GRAM STAIN (SUPERFICIAL SPECIMEN)

## 2022-10-20 MED ORDER — INSULIN LISPRO (1 UNIT DIAL) 100 UNIT/ML (KWIKPEN)
8.0000 [IU] | PEN_INJECTOR | Freq: Three times a day (TID) | SUBCUTANEOUS | 0 refills | Status: DC
Start: 2022-10-20 — End: 2023-05-30
  Filled 2022-10-20 (×2): qty 15, 63d supply, fill #0

## 2022-10-20 MED ORDER — HYDROCODONE-ACETAMINOPHEN 5-325 MG PO TABS
1.0000 | ORAL_TABLET | Freq: Four times a day (QID) | ORAL | 0 refills | Status: DC | PRN
  Filled 2022-10-20 (×2): qty 20, 5d supply, fill #0

## 2022-10-20 MED ORDER — FUROSEMIDE 20 MG PO TABS
ORAL_TABLET | ORAL | 0 refills | Status: DC
  Filled 2022-10-20: qty 10, 10d supply, fill #0
  Filled 2022-10-20: qty 10, fill #0

## 2022-10-20 MED ORDER — GABAPENTIN 300 MG PO CAPS
300.0000 mg | ORAL_CAPSULE | Freq: Three times a day (TID) | ORAL | 0 refills | Status: DC
  Filled 2022-10-20 (×2): qty 90, 30d supply, fill #0

## 2022-10-20 MED ORDER — DOXYCYCLINE HYCLATE 100 MG PO TABS
100.0000 mg | ORAL_TABLET | Freq: Two times a day (BID) | ORAL | Status: DC
  Administered 2022-10-20: 100 mg via ORAL
  Filled 2022-10-20: qty 1

## 2022-10-20 MED ORDER — MUSCLE RUB 10-15 % EX CREA
1.0000 | TOPICAL_CREAM | CUTANEOUS | 0 refills | Status: DC | PRN
  Filled 2022-10-20: qty 35, fill #0

## 2022-10-20 MED ORDER — DOXYCYCLINE HYCLATE 100 MG PO TABS
100.0000 mg | ORAL_TABLET | Freq: Two times a day (BID) | ORAL | 0 refills | Status: AC
  Filled 2022-10-20 (×2): qty 20, 10d supply, fill #0

## 2022-10-20 MED ORDER — TRESIBA FLEXTOUCH 200 UNIT/ML ~~LOC~~ SOPN: 110.0000 [IU] | PEN_INJECTOR | Freq: Every day | SUBCUTANEOUS | 3 refills | Status: AC

## 2022-10-20 NOTE — Discharge Summary (Signed)
Physician Discharge Summary   Patient: Martin [name] MRN: [id number] DOB: [birth year]  Admit date:     10/17/2022  Discharge date: 10/20/2022  Discharge Physician: Lynden Oxford  PCP: [facility]  Recommendations at discharge: Follow-up with ENT. Follow-up with PCP. Consider addition of GLP-1 agonist.   Follow-up Information     [place], [facility]. Schedule an appointment as soon as possible for a visit in 1 week(s).   Specialty: Family Medicine Contact information: [address] [place] [address] [phone]         Suzanna Obey, MD. Schedule an appointment as soon as possible for a visit in 1 week(s).   Specialty: Otolaryngology Why: For wound re-check Contact information: [address] [place] [address] [phone]                Discharge Diagnoses: Principal Problem:   Parotid abscess Active Problems:   Uncontrolled type 2 diabetes mellitus with hyperglycemia, with long-term current use of insulin (HCC)   Essential hypertension   Diabetic peripheral neuropathy (HCC)   Gout   Neck abscess  Hospital Course: PMH of HTN, obesity, type II DM presented to hospital with complaints of right-sided neck pain. Went to [facility] ED with complaints of insect bite on 5/7 as well as 5/8.  Was started on Bactrim. CT scan of the neck here on 5/13 shows a gas containing fluid collection overlying the right parotid gland with extensive edema concerning for abscess.  Patient started on IV antibiotics.  ENT consulted and did bedside I&D. Assessment and Plan  Right neck abscess. Treated with Bactrim outpatient. Failure and therefore came back to the ER. Underwent bedside I&D on 5/14. Cultures are sent.  Growing abundant Staph aureus.  MRSA.  Sensitive to doxycycline. Was treated with IV vancomycin and ceftriaxone.  Will switch to oral doxycycline.  Provide a longer course.   Diabetes  mellitus, uncontrolled with hyperglycemia with long-term insulin use with neuropathy complication. A1c more than 12. CBC is significantly elevated. Patient will be switching his endocrinologist per his discussion. Patient has lost significant amount of weight. Currently not taking any GLP-1 analogs. Continuing insulin therapy for now. Monitor   Diabetic neuropathy. Continue Neurontin.   HTN. Uncontrolled. Blood pressure elevated.  Will continue current regimen.   Obesity Body mass index is 35.15 kg/m.  Placing the pt at higher risk of poor outcomes.   Chronic pedal edema. On Lasix as needed. Would recommend to use Lasix on the weekend given his profession.  Pain control - [employer] Controlled Substance Reporting System database was reviewed. and patient was instructed, not to drive, operate heavy machinery, perform activities at heights, swimming or participation in water activities or provide baby-sitting services while on Pain, Sleep and Anxiety Medications; until their outpatient Physician has advised to do so again. Also recommended to not to take more than prescribed Pain, Sleep and Anxiety Medications.  Consultants:  ENT  Procedures performed:  Bedside I&D  DISCHARGE MEDICATION: Allergies as of 10/20/2022   No Known Allergies      Medication List     STOP taking these medications    amoxicillin-clavulanate 875-125 MG tablet Commonly known as: AUGMENTIN   cephALEXin 500 MG capsule Commonly known as: KEFLEX   ciprofloxacin 500 MG tablet Commonly known as: Cipro   gentamicin cream 0.1 % Commonly known as: GARAMYCIN   lidocaine 5 % Commonly known as: Lidoderm   mupirocin ointment 2 % Commonly  known as: BACTROBAN   sulfamethoxazole-trimethoprim 800-160 MG tablet Commonly known as: BACTRIM DS   triamcinolone 0.025 % ointment Commonly known as: KENALOG       TAKE these medications    atorvastatin 80 MG tablet Commonly known as: LIPITOR Take  1 tablet (80 mg total) by mouth at bedtime.   colchicine 0.6 MG tablet Take 2 tablets once and then 1 tablet 1 hour later once for 1 day for gouty flare-ups.   cyclobenzaprine 10 MG tablet Commonly known as: FLEXERIL Take 1 tablet (10 mg total) by mouth 2 (two) times daily as needed for muscle spasms.   Dexcom G7 Sensor Misc Change 1 sensor every 10 days on insulin 3 times daily   doxycycline 100 MG tablet Commonly known as: VIBRA-TABS Take 1 tablet (100 mg total) by mouth 2 (two) times daily for 10 days.   freestyle lancets Use three times daily as directed   FREESTYLE LITE test strip Generic drug: glucose blood Use three times daily as directed What changed: Another medication with the same name was removed. Continue taking this medication, and follow the directions you see here.   furosemide 20 MG tablet Commonly known as: LASIX Take 1 tablet (20 mg) daily until 10/23/2022; THEN, take 1 tablet (20 mg) daily every Saturday and Sunday thereafter. Take 1 extra tablet for weight gain of 3 lbs in 1 day or 5 lbs in 2 days as needed. What changed:  how much to take how to take this when to take this reasons to take this additional instructions   gabapentin 300 MG capsule Commonly known as: Neurontin Take 1 capsule (300 mg total) by mouth 3 (three) times daily. What changed:  when to take this reasons to take this   HYDROcodone-acetaminophen 5-325 MG tablet Commonly known as: NORCO/VICODIN Take 1 tablet by mouth every 6 (six) hours as needed for moderate pain or severe pain. What changed:  how much to take reasons to take this   ibuprofen 800 MG tablet Commonly known as: ADVIL Take 1 tablet (800 mg total) by mouth every 8 (eight) hours as needed for pain   insulin lispro 100 UNIT/ML KwikPen Commonly known as: HUMALOG Inject 8 Units into the skin 3 (three) times daily with meals. Only take if eating a meal AND Blood Glucose (BG) is 80 or higher. What changed:  how  much to take when to take this additional instructions   Jardiance 25 MG Tabs tablet Generic drug: empagliflozin Take 1 tablet (25 mg total) by mouth daily.   lisinopril 10 MG tablet Commonly known as: ZESTRIL Take 1 tablet (10 mg total) by mouth daily.   meloxicam 15 MG tablet Commonly known as: MOBIC Take 1 tablet (15 mg total) by mouth daily.   metFORMIN 500 MG 24 hr tablet Commonly known as: GLUCOPHAGE-XR Take 2 tablets (1,000 mg total) by mouth 2 (two) times daily with a meal.   Muscle Rub 10-15 % Crea Apply 1 Application topically as needed for muscle pain.   sildenafil 100 MG tablet Commonly known as: Viagra Take 1 tablet (100 mg total) by mouth up to once daily as needed   TechLite Pen Needles 32G X 6 MM Misc Generic drug: Insulin Pen Needle Use to inject insulin 2 times a day for 90 days.   Evaristo Bury FlexTouch 200 UNIT/ML FlexTouch Pen Generic drug: insulin degludec Inject 110 Units into the skin daily. What changed:  how much to take Another medication with the same name was removed. Continue  taking this medication, and follow the directions you see here.   Trulicity 1.5 MG/0.5ML Sopn Generic drug: Dulaglutide Inject 1.5 mg into the skin once a week. What changed: Another medication with the same name was removed. Continue taking this medication, and follow the directions you see here.               Discharge Care Instructions  (From admission, onward)           Start     Ordered   10/20/22 0000  Discharge wound care:       Comments: Keep dressing clean and dry, follow up with ENT in 1 week   10/20/22 1148           Disposition: Home Diet recommendation: Carb modified diet  Discharge Exam: Vitals:   10/19/22 1549 10/19/22 2111 10/20/22 0439 10/20/22 0922  BP: 128/85 (!) 142/80 (!) 162/87 138/75  Pulse: 64 78 78 79  Resp: 17   18  Temp: 98.1 F (36.7 C) 97.9 F (36.6 C) 98.2 F (36.8 C) 98.2 F (36.8 C)  TempSrc: Oral Oral Oral  Oral  SpO2: 98% 98% 98% 98%  Weight:      Height:       General: Appear in mild distress; no visible Abnormal Neck Mass Or lumps, Conjunctiva normal Cardiovascular: S1 and S2 Present, no Murmur, Respiratory: good respiratory effort, Bilateral Air entry present and CTA, no Crackles, no wheezes Abdomen: Bowel Sound present, Non tender  Extremities: bilateral  Pedal edema Neurology: alert and oriented to time, place, and person  Filed Weights   10/17/22 1522  Weight: 114.3 kg   Condition at discharge: stable  The results of significant diagnostics from this hospitalization (including imaging, microbiology, ancillary and laboratory) are listed below for reference.   Imaging Studies: CT Soft Tissue Neck W Contrast  Result Date: 10/17/2022 CLINICAL DATA:  Soft tissue infection suspected, neck, xray done. Spider bite to right neck/jaw area. Progressive swelling since. EXAM: CT NECK WITH CONTRAST TECHNIQUE: Multidetector CT imaging of the neck was performed using the standard protocol following the bolus administration of intravenous contrast. RADIATION DOSE REDUCTION: This exam was performed according to the departmental dose-optimization program which includes automated exposure control, adjustment of the mA and/or kV according to patient size and/or use of iterative reconstruction technique. CONTRAST:  80mL OMNIPAQUE IOHEXOL 300 MG/ML  SOLN COMPARISON:  None available. FINDINGS: Pharynx and larynx: Normal. No mass or swelling. Salivary glands: Peripherally enhancing, gas containing 1.6 x 1.8 cm fluid collection in the subcutaneous fat overlying the right parotid gland with extensive surrounding edema and skin thickening. Mild reactive edema in the underlying parotid gland. The left parotid and bilateral submandibular glands are normal. Thyroid: Normal. Lymph nodes: Mildly prominent right upper cervical lymph nodes, likely reactive. Vascular: Atherosclerotic vascular changes of the left carotid bulb.  Limited intracranial: Incidental left middle cranial fossa arachnoid cyst. Visualized orbits: Unremarkable. Mastoids and visualized paranasal sinuses: Well aerated. Skeleton: Mild cervical spondylosis without high-grade spinal canal stenosis. Upper chest: Unremarkable. Other: None. IMPRESSION: 1. Peripherally enhancing, gas containing fluid collection overlying the right parotid gland with extensive surrounding edema, measuring up to 1.8 cm and concerning for abscess. 2. Mild reactive edema in the underlying right parotid gland. 3. Mildly prominent right upper cervical lymph nodes, likely reactive. Electronically Signed   By: Orvan Falconer M.D.   On: 10/17/2022 18:37    Microbiology: Results for orders placed or performed during the hospital encounter of 10/17/22  Aerobic Culture  w Gram Stain (superficial specimen)     Status: None   Collection Time: 10/18/22 11:33 AM   Specimen: Abscess  Result Value Ref Range Status   Specimen Description ABSCESS  Final   Special Requests NONE  Final   Gram Stain   Final    RARE WBC PRESENT, PREDOMINANTLY PMN FEW GRAM POSITIVE COCCI Performed at [facility], [address], [place], [place] [address]    Culture   Final    ABUNDANT METHICILLIN RESISTANT STAPHYLOCOCCUS AUREUS   Report Status 10/20/2022 FINAL  Final   Organism ID, Bacteria METHICILLIN RESISTANT STAPHYLOCOCCUS AUREUS  Final      Susceptibility   Methicillin resistant staphylococcus aureus - MIC*    CIPROFLOXACIN >=8 RESISTANT Resistant     ERYTHROMYCIN >=8 RESISTANT Resistant     GENTAMICIN <=0.5 SENSITIVE Sensitive     OXACILLIN >=4 RESISTANT Resistant     TETRACYCLINE <=1 SENSITIVE Sensitive     VANCOMYCIN 1 SENSITIVE Sensitive     TRIMETH/SULFA <=10 SENSITIVE Sensitive     CLINDAMYCIN <=0.25 SENSITIVE Sensitive     RIFAMPIN <=0.5 SENSITIVE Sensitive     Inducible Clindamycin NEGATIVE Sensitive     LINEZOLID 2 SENSITIVE Sensitive     * ABUNDANT METHICILLIN RESISTANT  STAPHYLOCOCCUS AUREUS   Labs: CBC: Recent Labs  Lab 10/17/22 1652 10/18/22 0050  WBC 10.7* 9.8  NEUTROABS 6.9  --   HGB 15.7 14.5  HCT 46.4 42.9  MCV 83.5 83.3  PLT 254 260   Basic Metabolic Panel: Recent Labs  Lab 10/17/22 1652 10/18/22 0050 10/19/22 0511 10/20/22 0025  NA 130* 131* 134* 133*  K 4.4 4.1 4.0 3.7  CL 96* 95* 102 100  CO2 23 23 21* 21*  GLUCOSE 295* 450* 174* 244*  BUN 16 12 16 16   CREATININE 0.80 0.85 0.74 0.73  CALCIUM 10.0 9.0 9.3 9.0   Liver Function Tests: No results for input(s): "AST", "ALT", "ALKPHOS", "BILITOT", "PROT", "ALBUMIN" in the last 168 hours. CBG: Recent Labs  Lab 10/19/22 0820 10/19/22 1157 10/19/22 1712 10/19/22 2109 10/20/22 0857  GLUCAP 197* 229* 138* 279* 277*    Discharge time spent: greater than 30 minutes.  Signed: Lynden Oxford, MD [facility] 10/20/2022

## 2022-10-25 ENCOUNTER — Other Ambulatory Visit (HOSPITAL_COMMUNITY): Payer: Self-pay

## 2022-10-25 MED ORDER — METFORMIN HCL ER 500 MG PO TB24
1000.0000 mg | ORAL_TABLET | Freq: Two times a day (BID) | ORAL | 0 refills | Status: DC
  Filled 2022-10-25: qty 360, 90d supply, fill #0

## 2022-10-26 ENCOUNTER — Other Ambulatory Visit (HOSPITAL_COMMUNITY): Payer: Self-pay

## 2022-10-26 DIAGNOSIS — M25561 Pain in right knee: Secondary | ICD-10-CM | POA: Diagnosis not present

## 2022-10-26 DIAGNOSIS — M25562 Pain in left knee: Secondary | ICD-10-CM | POA: Diagnosis not present

## 2022-10-28 ENCOUNTER — Other Ambulatory Visit (HOSPITAL_COMMUNITY): Payer: Self-pay

## 2022-10-28 DIAGNOSIS — N529 Male erectile dysfunction, unspecified: Secondary | ICD-10-CM | POA: Diagnosis not present

## 2022-10-28 DIAGNOSIS — R600 Localized edema: Secondary | ICD-10-CM | POA: Diagnosis not present

## 2022-10-28 DIAGNOSIS — Z794 Long term (current) use of insulin: Secondary | ICD-10-CM | POA: Diagnosis not present

## 2022-10-28 DIAGNOSIS — E1165 Type 2 diabetes mellitus with hyperglycemia: Secondary | ICD-10-CM | POA: Diagnosis not present

## 2022-10-28 DIAGNOSIS — Z6835 Body mass index (BMI) 35.0-35.9, adult: Secondary | ICD-10-CM | POA: Diagnosis not present

## 2022-10-28 DIAGNOSIS — K113 Abscess of salivary gland: Secondary | ICD-10-CM | POA: Diagnosis not present

## 2022-10-28 MED ORDER — SILDENAFIL CITRATE 100 MG PO TABS
50.0000 mg | ORAL_TABLET | Freq: Every day | ORAL | 5 refills | Status: DC | PRN
  Filled 2022-10-28: qty 6, 30d supply, fill #0
  Filled 2022-11-30: qty 6, 30d supply, fill #1
  Filled 2023-01-26: qty 6, 30d supply, fill #2

## 2022-11-04 ENCOUNTER — Other Ambulatory Visit (HOSPITAL_COMMUNITY): Payer: Self-pay

## 2022-11-07 ENCOUNTER — Encounter (INDEPENDENT_AMBULATORY_CARE_PROVIDER_SITE_OTHER): Payer: Self-pay | Admitting: Otolaryngology

## 2022-11-07 ENCOUNTER — Ambulatory Visit (INDEPENDENT_AMBULATORY_CARE_PROVIDER_SITE_OTHER): Payer: Worker's Compensation | Admitting: Otolaryngology

## 2022-11-07 VITALS — BP 112/75 | HR 77 | Ht 71.0 in | Wt 250.0 lb

## 2022-11-07 DIAGNOSIS — L0211 Cutaneous abscess of neck: Secondary | ICD-10-CM | POA: Diagnosis not present

## 2022-11-07 DIAGNOSIS — S1086XD Insect bite of other specified part of neck, subsequent encounter: Secondary | ICD-10-CM

## 2022-11-07 NOTE — Progress Notes (Signed)
Martin [name] is an 57 y.o. male.   Chief Complaint: neck infection HPI: hx of a bug bite to the right side of the neck and developed an abscess. He was hospitalized for days and had I/D of the right abscess. He is now off antibiotics and doing well. No pain. No swelling. Still with some firmness  Past Medical History:  Diagnosis Date   Arthritis    Chest pain    Diabetes mellitus    Gout    History of kidney stones    Hyperlipidemia    Hypertension    Obesity     Past Surgical History:  Procedure Laterality Date   AMPUTATION TOE Right 01/23/2021   Procedure: AMPUTATION OF HALLUX;  Surgeon: Vivi Barrack, DPM;  Location: [facility];  Service: Podiatry;  Laterality: Right;   AMPUTATION TOE Left 07/29/2022   Procedure: AMPUTATION TOE FIRST;  Surgeon: Vivi Barrack, DPM;  Location: [facility];  Service: Podiatry;  Laterality: Left;   UMBILICAL HERNIA REPAIR      Family History  Problem Relation Age of Onset   Diabetes Maternal Grandfather    Kidney disease Other    Social History:  reports that he has never smoked. His smokeless tobacco use includes chew. He reports current alcohol use. He reports that he does not use drugs.  Allergies: No Known Allergies  (Not in a hospital admission)   No results found for this or any previous visit (from the past 48 hour(s)). No results found.   There were no vitals taken for this visit.  PHYSICAL EXAM: Appearance _ awake alert with no distress.  Head- atraumatic and no obvious abnormalities Eyes- PERRL, EOMI, no conjunctiva injection or ecchymosis Ears-  Right- Pinna without inflammation or swelling. canal without obstruction or injury. TM within normal limits  Left- Pinna without inflammation or swelling. canal without obstruction or injury. TM within normal limits Nose- no lesions or masses.  Oc/OP- no lesions or excessive swelling. Mouth opening normal.  Hp/Larynx- normal voice and no airway issues. No swelling or  lesions Neck- no mass or lesions. Normal movement. The area has healed on the right neck and has some scarring in the subcutaneous tissue. No pain or erythema Neuro- CNII-XII intact, no sensory deficits.  Lungs- normal effort no distress noted    Assessment/Plan Right neck abscess- he had a bite then abscess. He is now without evidence of infection. The scarring should soften up and he will follow up as needed.  Suzanna Obey 11/07/2022, 9:43 AM

## 2022-11-09 ENCOUNTER — Telehealth: Payer: Self-pay

## 2022-11-09 NOTE — Telephone Encounter (Signed)
Faxed signed Hospital Claim Form to [facility] for W/C

## 2022-11-14 ENCOUNTER — Other Ambulatory Visit: Payer: Self-pay

## 2022-11-14 ENCOUNTER — Other Ambulatory Visit (HOSPITAL_COMMUNITY): Payer: Self-pay

## 2022-11-14 MED ORDER — TRESIBA FLEXTOUCH 200 UNIT/ML ~~LOC~~ SOPN
100.0000 [IU] | PEN_INJECTOR | Freq: Every day | SUBCUTANEOUS | 3 refills | Status: DC
  Filled 2022-11-14: qty 33, 66d supply, fill #0
  Filled 2023-01-16: qty 33, 66d supply, fill #1
  Filled 2023-03-17: qty 33, 66d supply, fill #2
  Filled 2023-05-15: qty 33, 66d supply, fill #3

## 2022-11-15 ENCOUNTER — Other Ambulatory Visit (HOSPITAL_COMMUNITY): Payer: Self-pay

## 2022-11-16 ENCOUNTER — Other Ambulatory Visit (HOSPITAL_COMMUNITY): Payer: Self-pay

## 2022-11-23 ENCOUNTER — Other Ambulatory Visit (HOSPITAL_COMMUNITY): Payer: Self-pay

## 2022-11-23 ENCOUNTER — Other Ambulatory Visit: Payer: Self-pay

## 2022-11-23 DIAGNOSIS — Z794 Long term (current) use of insulin: Secondary | ICD-10-CM | POA: Diagnosis not present

## 2022-11-23 DIAGNOSIS — Z89429 Acquired absence of other toe(s), unspecified side: Secondary | ICD-10-CM | POA: Diagnosis not present

## 2022-11-23 DIAGNOSIS — E781 Pure hyperglyceridemia: Secondary | ICD-10-CM | POA: Diagnosis not present

## 2022-11-23 DIAGNOSIS — E1165 Type 2 diabetes mellitus with hyperglycemia: Secondary | ICD-10-CM | POA: Diagnosis not present

## 2022-11-23 MED ORDER — FREESTYLE LIBRE 3 READER DEVI
Freq: Four times a day (QID) | 0 refills | Status: AC
  Filled 2022-11-23: qty 1, 1d supply, fill #0

## 2022-11-23 MED ORDER — JARDIANCE 25 MG PO TABS
25.0000 mg | ORAL_TABLET | Freq: Every day | ORAL | 4 refills | Status: DC
  Filled 2022-11-23 – 2023-04-12 (×2): qty 90, 90d supply, fill #0

## 2022-11-23 MED ORDER — LISINOPRIL 10 MG PO TABS
10.0000 mg | ORAL_TABLET | Freq: Every day | ORAL | 4 refills | Status: DC
  Filled 2022-11-23 – 2023-07-12 (×2): qty 90, 90d supply, fill #0

## 2022-11-23 MED ORDER — ATORVASTATIN CALCIUM 80 MG PO TABS
80.0000 mg | ORAL_TABLET | Freq: Every evening | ORAL | 4 refills | Status: DC
  Filled 2022-11-23 – 2023-04-26 (×2): qty 90, 90d supply, fill #0

## 2022-11-23 MED ORDER — FREESTYLE LIBRE 3 SENSOR MISC
4 refills | Status: AC
  Filled 2022-11-23: qty 2, 28d supply, fill #0

## 2022-11-23 MED ORDER — METFORMIN HCL ER 500 MG PO TB24
1000.0000 mg | ORAL_TABLET | Freq: Two times a day (BID) | ORAL | 4 refills | Status: AC
  Filled 2022-11-23 – 2023-04-12 (×2): qty 360, 90d supply, fill #0
  Filled 2023-10-23: qty 360, 90d supply, fill #1

## 2022-11-30 ENCOUNTER — Other Ambulatory Visit (HOSPITAL_COMMUNITY): Payer: Self-pay

## 2022-12-02 ENCOUNTER — Other Ambulatory Visit (HOSPITAL_COMMUNITY): Payer: Self-pay

## 2022-12-05 ENCOUNTER — Other Ambulatory Visit (HOSPITAL_COMMUNITY): Payer: Self-pay

## 2022-12-06 DIAGNOSIS — E781 Pure hyperglyceridemia: Secondary | ICD-10-CM | POA: Diagnosis not present

## 2022-12-06 DIAGNOSIS — E1165 Type 2 diabetes mellitus with hyperglycemia: Secondary | ICD-10-CM | POA: Diagnosis not present

## 2022-12-09 ENCOUNTER — Other Ambulatory Visit (HOSPITAL_COMMUNITY): Payer: Self-pay

## 2022-12-09 DIAGNOSIS — M25551 Pain in right hip: Secondary | ICD-10-CM | POA: Diagnosis not present

## 2022-12-09 DIAGNOSIS — M25561 Pain in right knee: Secondary | ICD-10-CM | POA: Diagnosis not present

## 2022-12-09 MED ORDER — MELOXICAM 15 MG PO TABS
15.0000 mg | ORAL_TABLET | Freq: Every day | ORAL | 1 refills | Status: DC
  Filled 2022-12-09: qty 30, 30d supply, fill #0
  Filled 2023-01-16: qty 30, 30d supply, fill #1

## 2022-12-12 ENCOUNTER — Ambulatory Visit: Payer: Commercial Managed Care - PPO | Admitting: Podiatry

## 2022-12-26 ENCOUNTER — Other Ambulatory Visit (HOSPITAL_COMMUNITY): Payer: Self-pay

## 2023-01-16 ENCOUNTER — Other Ambulatory Visit (HOSPITAL_COMMUNITY): Payer: Self-pay

## 2023-01-19 ENCOUNTER — Other Ambulatory Visit (HOSPITAL_COMMUNITY): Payer: Self-pay

## 2023-02-07 DIAGNOSIS — E781 Pure hyperglyceridemia: Secondary | ICD-10-CM | POA: Diagnosis not present

## 2023-02-07 DIAGNOSIS — Z89429 Acquired absence of other toe(s), unspecified side: Secondary | ICD-10-CM | POA: Diagnosis not present

## 2023-02-07 DIAGNOSIS — Z794 Long term (current) use of insulin: Secondary | ICD-10-CM | POA: Diagnosis not present

## 2023-02-07 DIAGNOSIS — E1165 Type 2 diabetes mellitus with hyperglycemia: Secondary | ICD-10-CM | POA: Diagnosis not present

## 2023-03-07 ENCOUNTER — Other Ambulatory Visit (HOSPITAL_COMMUNITY): Payer: Self-pay

## 2023-03-07 DIAGNOSIS — Z8739 Personal history of other diseases of the musculoskeletal system and connective tissue: Secondary | ICD-10-CM | POA: Diagnosis not present

## 2023-03-07 DIAGNOSIS — N529 Male erectile dysfunction, unspecified: Secondary | ICD-10-CM | POA: Diagnosis not present

## 2023-03-07 DIAGNOSIS — I10 Essential (primary) hypertension: Secondary | ICD-10-CM | POA: Diagnosis not present

## 2023-03-07 DIAGNOSIS — Z0000 Encounter for general adult medical examination without abnormal findings: Secondary | ICD-10-CM | POA: Diagnosis not present

## 2023-03-07 DIAGNOSIS — Z794 Long term (current) use of insulin: Secondary | ICD-10-CM | POA: Diagnosis not present

## 2023-03-07 DIAGNOSIS — E782 Mixed hyperlipidemia: Secondary | ICD-10-CM | POA: Diagnosis not present

## 2023-03-07 DIAGNOSIS — E1165 Type 2 diabetes mellitus with hyperglycemia: Secondary | ICD-10-CM | POA: Diagnosis not present

## 2023-03-07 DIAGNOSIS — Z23 Encounter for immunization: Secondary | ICD-10-CM | POA: Diagnosis not present

## 2023-03-07 MED ORDER — TADALAFIL 20 MG PO TABS
20.0000 mg | ORAL_TABLET | ORAL | 5 refills | Status: AC | PRN
  Filled 2023-03-07: qty 6, 30d supply, fill #0
  Filled 2023-04-26: qty 6, 30d supply, fill #1
  Filled 2024-02-09: qty 6, 30d supply, fill #2

## 2023-03-07 MED ORDER — MELOXICAM 15 MG PO TABS
15.0000 mg | ORAL_TABLET | Freq: Every day | ORAL | 5 refills | Status: DC
  Filled 2023-03-07: qty 30, 30d supply, fill #0

## 2023-03-08 ENCOUNTER — Ambulatory Visit: Payer: Commercial Managed Care - PPO | Admitting: Physical Therapy

## 2023-03-08 ENCOUNTER — Other Ambulatory Visit (HOSPITAL_COMMUNITY): Payer: Self-pay

## 2023-03-10 ENCOUNTER — Other Ambulatory Visit (HOSPITAL_COMMUNITY): Payer: Self-pay

## 2023-03-15 ENCOUNTER — Ambulatory Visit: Payer: Commercial Managed Care - PPO | Admitting: Physical Therapy

## 2023-03-17 ENCOUNTER — Other Ambulatory Visit (HOSPITAL_COMMUNITY): Payer: Self-pay

## 2023-04-03 ENCOUNTER — Other Ambulatory Visit: Payer: Self-pay

## 2023-04-03 ENCOUNTER — Ambulatory Visit: Payer: Commercial Managed Care - PPO | Attending: Orthopaedic Surgery | Admitting: Physical Therapy

## 2023-04-03 DIAGNOSIS — R29898 Other symptoms and signs involving the musculoskeletal system: Secondary | ICD-10-CM | POA: Insufficient documentation

## 2023-04-03 NOTE — Therapy (Signed)
OUTPATIENT PHYSICAL THERAPY THORACOLUMBAR EVALUATION   Patient Name: Martin [name] MRN: [id number] DOB:23-Jul-1965, 57 y.o., male Today's Date: 04/03/2023  END OF SESSION:  PT End of Session - 04/03/23 1519     Visit Number 1    Number of Visits 8    Date for PT Re-Evaluation 05/15/23    PT Start Time 0236    PT Stop Time 0311    PT Time Calculation (min) 35 min    Activity Tolerance Patient tolerated treatment well    Behavior During Therapy [facility] for tasks assessed/performed             Past Medical History:  Diagnosis Date   Arthritis    Chest pain    Diabetes mellitus    Gout    History of kidney stones    Hyperlipidemia    Hypertension    Obesity    Past Surgical History:  Procedure Laterality Date   AMPUTATION TOE Right 01/23/2021   Procedure: AMPUTATION OF HALLUX;  Surgeon: Vivi Barrack, DPM;  Location: [facility];  Service: Podiatry;  Laterality: Right;   AMPUTATION TOE Left 07/29/2022   Procedure: AMPUTATION TOE FIRST;  Surgeon: Vivi Barrack, DPM;  Location: [facility];  Service: Podiatry;  Laterality: Left;   UMBILICAL HERNIA REPAIR     Patient Active Problem List   Diagnosis Date Noted   Parotid abscess 10/18/2022   Diabetic peripheral neuropathy (HCC) 10/18/2022   Neck abscess 10/17/2022   Enterococcus faecalis infection 05/03/2021   Open wound of toe 05/03/2021   Health maintenance examination 03/16/2021   Need for vaccination 03/16/2021   Osteomyelitis of great toe of right foot (HCC) 01/20/2021   Acute idiopathic gout of right ankle 12/14/2018   Bilateral hand swelling 01/31/2018   Left leg swelling 01/31/2018   Low grade fever 01/31/2018   Tooth abscess 01/31/2018   Uncontrolled type 2 diabetes mellitus with hyperglycemia, with long-term current use of insulin (HCC) 10/05/2015   Essential hypertension 10/05/2015   Obesity 10/05/2015   Chest pain 12/19/2014   Hypertriglyceridemia 08/20/2012   Gout 07/25/2012   Essential hypertension,  benign 07/25/2012   Other and unspecified hyperlipidemia 07/25/2012   Glaucoma 07/25/2012    REFERRING PROVIDER: Marcene Corning MD  REFERRING DIAG: Right hip/groin pain.  Rationale for Evaluation and Treatment: Rehabilitation  THERAPY DIAG:  Weakness of right hip  ONSET DATE: ~2 months.  SUBJECTIVE:                                                                                                                                                                                           SUBJECTIVE  STATEMENT: The patient presents to the clinic with c/o right hip weakness.  He states he was coming out of a restaurant a couple of weeks ago and tripped and fell hard on his right shin and knee.  He states that some time after that he was having difficulty lifting up his right hip such as going up steps and braking on his truck.  In fact, he uses his UE's to lift his leg.  He has no pain complaints.    PERTINENT HISTORY:  Bilateral toe amputation, H/o Sciatica    PAIN:  Are you having pain? No  PRECAUTIONS: None  RED FLAGS: None   WEIGHT BEARING RESTRICTIONS: No  FALLS:  Has patient fallen in last 6 months? Yes. Number of falls 1.    LIVING ENVIRONMENT: Lives with: lives with their spouse Lives in: House/apartment Has following equipment at home: None  OCCUPATION: Works at [employer].  PLOF: Independent  PATIENT GOALS: Get right hip stronger.    OBJECTIVE:   OBSERVATION:  Significant right quadriceps atrophy per contralateral comparison.  PALPATION: No palpable tenderness.   LOWER EXTREMITY ROM:     Right hip IR is 15 degrees (same on left).  LOWER EXTREMITY MMT:    Right hip abduction is 5/5, right knee and ankle strength is 5/5.  Right hip flexion tested in standing is 4/5 per contralateral comparison.    FUNCTIONAL/SPECIAL TESTS:  With UE support in parallel bars the patient can step up to an 8 inch step but unassisted he has a lot more difficulty.  Also, sit to  supine transitions require UE assist to his right LE to perform.  (-) FABER and FADIR testing. (-) Right SLR test.  Tight hamstrings bilaterally.  GAIT: Wide BOS and tendency for bilateral LE ER.  PATIENT EDUCATION:   HOME EXERCISE PROGRAM:  Today's treatment:  Recumbent bike x 5 minutes at level 3.  No difficulty or complaints.     ASSESSMENT:  CLINICAL IMPRESSION: The patient presents to [facility] with c/o right hip weakness.  He attributes this to a fall a couple of months ago that was extremely painful over his right shin and knee region.  He reports no pain.  He has difficulty lifting his right LE to a step, go from sit to supine and apply the brake on his truck.  He has notable right quadriceps atrophy.  His hip is weak into right hip flexion per contralateral comparison.  Special testing is negative.  Patient will benefit from skilled physical therapy intervention to address pain and deficits.  OBJECTIVE IMPAIRMENTS: decreased activity tolerance and decreased strength.   ACTIVITY LIMITATIONS: stairs and bed mobility  PARTICIPATION LIMITATIONS: driving  REHAB POTENTIAL: Good  CLINICAL DECISION MAKING: Evolving/moderate complexity  EVALUATION COMPLEXITY: Moderate   GOALS:  SHORT TERM GOALS: Target date: 05/01/23  Ind with a HEP. Goal status: INITIAL  2.  Transfer from sit to supine independently.   Goal status: INITIAL  3.  Perform stairs independently.    Goal status: INITIAL  4.  Apply brake on truck independently.  Goal status: INITIAL   PLAN:  PT FREQUENCY: 2x/week  PT DURATION: 4 weeks  PLANNED INTERVENTIONS: 97110-Therapeutic exercises, 97530- Therapeutic activity, [id number]- Neuromuscular re-education, 97535- Self Care, [address]- Manual therapy, 97014- Electrical stimulation (unattended), 97035- Ultrasound, Patient/Family education, Cryotherapy, and Moist heat.  PLAN FOR NEXT SESSION: Recumbent bike, resisted right hip flexion, please instruct in core exercises.      Elka Satterfield, [place], PT 04/03/2023, 4:19 PM

## 2023-04-04 DIAGNOSIS — L03221 Cellulitis of neck: Secondary | ICD-10-CM | POA: Diagnosis not present

## 2023-04-06 ENCOUNTER — Ambulatory Visit: Payer: Commercial Managed Care - PPO | Admitting: *Deleted

## 2023-04-06 DIAGNOSIS — R29898 Other symptoms and signs involving the musculoskeletal system: Secondary | ICD-10-CM

## 2023-04-06 NOTE — Therapy (Signed)
OUTPATIENT PHYSICAL THERAPY THORACOLUMBAR EVALUATION   Patient Name: Martin [name] MRN: [id number] DOB:12/21/1965, 57 y.o., male Today's Date: 04/06/2023  END OF SESSION:  PT End of Session - 04/06/23 1616     Visit Number 2    Number of Visits 8    Date for PT Re-Evaluation 05/15/23    PT Start Time 1600    PT Stop Time 1647    PT Time Calculation (min) 47 min             Past Medical History:  Diagnosis Date   Arthritis    Chest pain    Diabetes mellitus    Gout    History of kidney stones    Hyperlipidemia    Hypertension    Obesity    Past Surgical History:  Procedure Laterality Date   AMPUTATION TOE Right 01/23/2021   Procedure: AMPUTATION OF HALLUX;  Surgeon: Vivi Barrack, DPM;  Location: [facility];  Service: Podiatry;  Laterality: Right;   AMPUTATION TOE Left 07/29/2022   Procedure: AMPUTATION TOE FIRST;  Surgeon: Vivi Barrack, DPM;  Location: [facility];  Service: Podiatry;  Laterality: Left;   UMBILICAL HERNIA REPAIR     Patient Active Problem List   Diagnosis Date Noted   Parotid abscess 10/18/2022   Diabetic peripheral neuropathy (HCC) 10/18/2022   Neck abscess 10/17/2022   Enterococcus faecalis infection 05/03/2021   Open wound of toe 05/03/2021   Health maintenance examination 03/16/2021   Need for vaccination 03/16/2021   Osteomyelitis of great toe of right foot (HCC) 01/20/2021   Acute idiopathic gout of right ankle 12/14/2018   Bilateral hand swelling 01/31/2018   Left leg swelling 01/31/2018   Low grade fever 01/31/2018   Tooth abscess 01/31/2018   Uncontrolled type 2 diabetes mellitus with hyperglycemia, with long-term current use of insulin (HCC) 10/05/2015   Essential hypertension 10/05/2015   Obesity 10/05/2015   Chest pain 12/19/2014   Hypertriglyceridemia 08/20/2012   Gout 07/25/2012   Essential hypertension, benign 07/25/2012   Other and unspecified hyperlipidemia 07/25/2012   Glaucoma 07/25/2012    REFERRING PROVIDER:  Marcene Corning MD  REFERRING DIAG: Right hip/groin pain.  Rationale for Evaluation and Treatment: Rehabilitation  THERAPY DIAG:  Weakness of right hip  ONSET DATE: ~2 months.  SUBJECTIVE:                                                                                                                                                                                           SUBJECTIVE STATEMENT: Pt arrived today doing fair with RT hip.   PERTINENT HISTORY:  Bilateral toe amputation, H/o Sciatica  PAIN:  Are you having pain? No  PRECAUTIONS: None  RED FLAGS: None   WEIGHT BEARING RESTRICTIONS: No  FALLS:  Has patient fallen in last 6 months? Yes. Number of falls 1.    LIVING ENVIRONMENT: Lives with: lives with their spouse Lives in: House/apartment Has following equipment at home: None  OCCUPATION: Works at [employer].  PLOF: Independent  PATIENT GOALS: Get right hip stronger.    OBJECTIVE:  DATE 06-06-23                                    EXERCISE LOG   RT Hip  Exercise Repetitions and Resistance Comments  Bike L3 x 15 mins   side lying IR 3x fatigue   Clam shell 3x fatigue    ABD 3xfatigue   Balance tandem 2x1 min holds each side Very challenging  Seated ER hip stretch             Blank cell = exercise not performed today  Reviewed current Hep OBSERVATION:  Significant right quadriceps atrophy per contralateral comparison.  PALPATION: No palpable tenderness.   LOWER EXTREMITY ROM:     Right hip IR is 15 degrees (same on left).  LOWER EXTREMITY MMT:    Right hip abduction is 5/5, right knee and ankle strength is 5/5.  Right hip flexion tested in standing is 4/5 per contralateral comparison.    FUNCTIONAL/SPECIAL TESTS:  With UE support in parallel bars the patient can step up to an 8 inch step but unassisted he has a lot more difficulty.  Also, sit to supine transitions require UE assist to his right LE to perform.  (-) FABER and FADIR testing.  (-) Right SLR test.  Tight hamstrings bilaterally.  GAIT: Wide BOS and tendency for bilateral LE ER.  PATIENT EDUCATION:   HOME EXERCISE PROGRAM:  Today's treatment:  Recumbent bike x 5 minutes at level 3.  No difficulty or complaints.     ASSESSMENT:  CLINICAL IMPRESSION:  Pt arrived today doing fair with RT hip and reports not trusting RT hip due to feeling like it gives away at times and is afraid of falling. Rx focused on mm control and activation hip rotators as well as balance activities and did fairly well. Balance fairly challenging to Pt    OBJECTIVE IMPAIRMENTS: decreased activity tolerance and decreased strength.   ACTIVITY LIMITATIONS: stairs and bed mobility  PARTICIPATION LIMITATIONS: driving  REHAB POTENTIAL: Good  CLINICAL DECISION MAKING: Evolving/moderate complexity  EVALUATION COMPLEXITY: Moderate   GOALS:  SHORT TERM GOALS: Target date: 05/01/23  Ind with a HEP. Goal status: INITIAL  2.  Transfer from sit to supine independently.   Goal status: INITIAL  3.  Perform stairs independently.    Goal status: INITIAL  4.  Apply brake on truck independently.  Goal status: INITIAL   PLAN:  PT FREQUENCY: 2x/week  PT DURATION: 4 weeks  PLANNED INTERVENTIONS: 97110-Therapeutic exercises, 97530- Therapeutic activity, [id number]- Neuromuscular re-education, 97535- Self Care, 16109- Manual therapy, 97014- Electrical stimulation (unattended), 97035- Ultrasound, Patient/Family education, Cryotherapy, and Moist heat.  PLAN FOR NEXT SESSION: Recumbent bike, resisted right hip flexion, please instruct in core exercises.     Shaleah Nissley,[name], PTA 04/06/2023, 5:53 PM

## 2023-04-10 ENCOUNTER — Encounter: Payer: Self-pay | Admitting: Physical Therapy

## 2023-04-10 ENCOUNTER — Ambulatory Visit: Payer: Commercial Managed Care - PPO | Attending: Orthopaedic Surgery | Admitting: Physical Therapy

## 2023-04-10 DIAGNOSIS — R29898 Other symptoms and signs involving the musculoskeletal system: Secondary | ICD-10-CM | POA: Insufficient documentation

## 2023-04-10 NOTE — Therapy (Signed)
OUTPATIENT PHYSICAL THERAPY THORACOLUMBAR EVALUATION   Patient Name: Martin [name] MRN: [id number] DOB:[birth year], 57 y.o., male Today's Date: 04/10/2023  END OF SESSION:  PT End of Session - 04/10/23 0958     Visit Number 3    Number of Visits 8    Date for PT Re-Evaluation 05/15/23    PT Start Time 0941    PT Stop Time 1014    PT Time Calculation (min) 33 min    Activity Tolerance Patient tolerated treatment well    Behavior During Therapy [facility] for tasks assessed/performed             Past Medical History:  Diagnosis Date   Arthritis    Chest pain    Diabetes mellitus    Gout    History of kidney stones    Hyperlipidemia    Hypertension    Obesity    Past Surgical History:  Procedure Laterality Date   AMPUTATION TOE Right 01/23/2021   Procedure: AMPUTATION OF HALLUX;  Surgeon: Vivi Barrack, DPM;  Location: [facility];  Service: Podiatry;  Laterality: Right;   AMPUTATION TOE Left 07/29/2022   Procedure: AMPUTATION TOE FIRST;  Surgeon: Vivi Barrack, DPM;  Location: [facility];  Service: Podiatry;  Laterality: Left;   UMBILICAL HERNIA REPAIR     Patient Active Problem List   Diagnosis Date Noted   Parotid abscess 10/18/2022   Diabetic peripheral neuropathy (HCC) 10/18/2022   Neck abscess 10/17/2022   Enterococcus faecalis infection 05/03/2021   Open wound of toe 05/03/2021   Health maintenance examination 03/16/2021   Need for vaccination 03/16/2021   Osteomyelitis of great toe of right foot (HCC) 01/20/2021   Acute idiopathic gout of right ankle 12/14/2018   Bilateral hand swelling 01/31/2018   Left leg swelling 01/31/2018   Low grade fever 01/31/2018   Tooth abscess 01/31/2018   Uncontrolled type 2 diabetes mellitus with hyperglycemia, with long-term current use of insulin (HCC) 10/05/2015   Essential hypertension 10/05/2015   Obesity 10/05/2015   Chest pain 12/19/2014   Hypertriglyceridemia 08/20/2012   Gout 07/25/2012   Essential hypertension,  benign 07/25/2012   Other and unspecified hyperlipidemia 07/25/2012   Glaucoma 07/25/2012    REFERRING PROVIDER: Marcene Corning MD  REFERRING DIAG: Right hip/groin pain.  Rationale for Evaluation and Treatment: Rehabilitation  THERAPY DIAG:  Weakness of right hip  ONSET DATE: ~2 months.  SUBJECTIVE:                                                                                                                                                                                           SUBJECTIVE  STATEMENT: No new complaints.  PERTINENT HISTORY:  Bilateral toe amputation, H/o Sciatica    PAIN:  Are you having pain? No  PRECAUTIONS: None  RED FLAGS: None   WEIGHT BEARING RESTRICTIONS: No  FALLS:  Has patient fallen in last 6 months? Yes. Number of falls 1.    LIVING ENVIRONMENT: Lives with: lives with their spouse Lives in: House/apartment Has following equipment at home: None  OCCUPATION: Works at [employer].  PLOF: Independent  PATIENT GOALS: Get right hip stronger.    OBJECTIVE:  DATE   04/10/23:                                       EXERCISE LOG  Exercise Repetitions and Resistance Comments  Recumbent bike 18 minutes at level 3.   Knee ext 10# x 3 minutes   Ham curls 40# x 3 minutes   Leg Press 3 plates x 3 minutes.           06-06-23                                    EXERCISE LOG   RT Hip  Exercise Repetitions and Resistance Comments  Bike L3 x 15 mins   side lying IR 3x fatigue   Clam shell 3x fatigue    ABD 3xfatigue   Balance tandem 2x1 min holds each side Very challenging  Seated ER hip stretch             Blank cell = exercise not performed today  Reviewed current Hep OBSERVATION:  Significant right quadriceps atrophy per contralateral comparison.  PALPATION: No palpable tenderness.   LOWER EXTREMITY ROM:     Right hip IR is 15 degrees (same on left).  LOWER EXTREMITY MMT:    Right hip abduction is 5/5, right knee and  ankle strength is 5/5.  Right hip flexion tested in standing is 4/5 per contralateral comparison.    FUNCTIONAL/SPECIAL TESTS:  With UE support in parallel bars the patient can step up to an 8 inch step but unassisted he has a lot more difficulty.  Also, sit to supine transitions require UE assist to his right LE to perform.  (-) FABER and FADIR testing. (-) Right SLR test.  Tight hamstrings bilaterally.  GAIT: Wide BOS and tendency for bilateral LE ER.  PATIENT EDUCATION:   HOME EXERCISE PROGRAM:  Today's treatment:  Recumbent bike x 5 minutes at level 3.  No difficulty or complaints.     ASSESSMENT:  CLINICAL IMPRESSION:  Patient did great today with exercise performing all activities with excellent technique and no complaints.   OBJECTIVE IMPAIRMENTS: decreased activity tolerance and decreased strength.   ACTIVITY LIMITATIONS: stairs and bed mobility  PARTICIPATION LIMITATIONS: driving  REHAB POTENTIAL: Good  CLINICAL DECISION MAKING: Evolving/moderate complexity  EVALUATION COMPLEXITY: Moderate   GOALS:  SHORT TERM GOALS: Target date: 05/01/23  Ind with a HEP. Goal status: INITIAL  2.  Transfer from sit to supine independently.   Goal status: INITIAL  3.  Perform stairs independently.    Goal status: INITIAL  4.  Apply brake on truck independently.  Goal status: INITIAL   PLAN:  PT FREQUENCY: 2x/week  PT DURATION: 4 weeks  PLANNED INTERVENTIONS: 97110-Therapeutic exercises, 97530- Therapeutic activity, [id number]- Neuromuscular re-education, 97535- Self Care, 16109- Manual therapy, 97014- Electrical  stimulation (unattended), [id number]- Ultrasound, Patient/Family education, Cryotherapy, and Moist heat.  PLAN FOR NEXT SESSION: Recumbent bike, resisted right hip flexion, please instruct in core exercises.     Keily Lepp, [place], PT 04/10/2023, 10:53 AM

## 2023-04-12 ENCOUNTER — Other Ambulatory Visit (HOSPITAL_COMMUNITY): Payer: Self-pay

## 2023-04-12 MED ORDER — METFORMIN HCL ER 500 MG PO TB24
500.0000 mg | ORAL_TABLET | Freq: Two times a day (BID) | ORAL | 3 refills | Status: DC
  Filled 2023-04-12: qty 360, 180d supply, fill #0

## 2023-04-12 MED ORDER — METFORMIN HCL ER 500 MG PO TB24
1000.0000 mg | ORAL_TABLET | Freq: Two times a day (BID) | ORAL | 3 refills | Status: DC
  Filled 2023-04-12: qty 360, 90d supply, fill #0

## 2023-04-13 ENCOUNTER — Encounter: Payer: Commercial Managed Care - PPO | Admitting: *Deleted

## 2023-04-17 ENCOUNTER — Ambulatory Visit: Payer: Commercial Managed Care - PPO | Admitting: *Deleted

## 2023-04-20 ENCOUNTER — Encounter: Payer: Commercial Managed Care - PPO | Admitting: *Deleted

## 2023-04-21 ENCOUNTER — Encounter: Payer: Commercial Managed Care - PPO | Admitting: Physical Therapy

## 2023-04-26 ENCOUNTER — Other Ambulatory Visit (HOSPITAL_COMMUNITY): Payer: Self-pay

## 2023-04-27 ENCOUNTER — Other Ambulatory Visit (HOSPITAL_COMMUNITY): Payer: Self-pay

## 2023-05-15 ENCOUNTER — Other Ambulatory Visit (HOSPITAL_COMMUNITY): Payer: Self-pay

## 2023-05-30 ENCOUNTER — Encounter (HOSPITAL_BASED_OUTPATIENT_CLINIC_OR_DEPARTMENT_OTHER): Payer: Self-pay | Admitting: Emergency Medicine

## 2023-05-30 ENCOUNTER — Inpatient Hospital Stay (HOSPITAL_BASED_OUTPATIENT_CLINIC_OR_DEPARTMENT_OTHER)
Admission: EM | Admit: 2023-05-30 | Discharge: 2023-06-03 | DRG: 603 | Disposition: A | Discharge status: Home / Self Care | Payer: Commercial Managed Care - PPO | Attending: Family Medicine | Admitting: Family Medicine

## 2023-05-30 ENCOUNTER — Other Ambulatory Visit (HOSPITAL_COMMUNITY): Payer: Self-pay

## 2023-05-30 ENCOUNTER — Emergency Department (HOSPITAL_BASED_OUTPATIENT_CLINIC_OR_DEPARTMENT_OTHER): Payer: Commercial Managed Care - PPO

## 2023-05-30 ENCOUNTER — Other Ambulatory Visit: Payer: Self-pay

## 2023-05-30 DIAGNOSIS — Z841 Family history of disorders of kidney and ureter: Secondary | ICD-10-CM

## 2023-05-30 DIAGNOSIS — G930 Cerebral cysts: Secondary | ICD-10-CM | POA: Diagnosis not present

## 2023-05-30 DIAGNOSIS — Z79899 Other long term (current) drug therapy: Secondary | ICD-10-CM

## 2023-05-30 DIAGNOSIS — L03221 Cellulitis of neck: Principal | ICD-10-CM | POA: Diagnosis present

## 2023-05-30 DIAGNOSIS — R600 Localized edema: Secondary | ICD-10-CM | POA: Diagnosis not present

## 2023-05-30 DIAGNOSIS — A419 Sepsis, unspecified organism: Secondary | ICD-10-CM | POA: Diagnosis not present

## 2023-05-30 DIAGNOSIS — Z7985 Long-term (current) use of injectable non-insulin antidiabetic drugs: Secondary | ICD-10-CM

## 2023-05-30 DIAGNOSIS — E119 Type 2 diabetes mellitus without complications: Secondary | ICD-10-CM

## 2023-05-30 DIAGNOSIS — T63301A Toxic effect of unspecified spider venom, accidental (unintentional), initial encounter: Secondary | ICD-10-CM | POA: Diagnosis present

## 2023-05-30 DIAGNOSIS — E669 Obesity, unspecified: Secondary | ICD-10-CM | POA: Diagnosis present

## 2023-05-30 DIAGNOSIS — E1165 Type 2 diabetes mellitus with hyperglycemia: Secondary | ICD-10-CM | POA: Diagnosis present

## 2023-05-30 DIAGNOSIS — Z791 Long term (current) use of non-steroidal anti-inflammatories (NSAID): Secondary | ICD-10-CM

## 2023-05-30 DIAGNOSIS — Z87442 Personal history of urinary calculi: Secondary | ICD-10-CM

## 2023-05-30 DIAGNOSIS — Z8614 Personal history of Methicillin resistant Staphylococcus aureus infection: Secondary | ICD-10-CM

## 2023-05-30 DIAGNOSIS — Z833 Family history of diabetes mellitus: Secondary | ICD-10-CM

## 2023-05-30 DIAGNOSIS — Z7984 Long term (current) use of oral hypoglycemic drugs: Secondary | ICD-10-CM

## 2023-05-30 DIAGNOSIS — E785 Hyperlipidemia, unspecified: Secondary | ICD-10-CM | POA: Diagnosis present

## 2023-05-30 DIAGNOSIS — L0211 Cutaneous abscess of neck: Secondary | ICD-10-CM | POA: Diagnosis not present

## 2023-05-30 DIAGNOSIS — Z6833 Body mass index (BMI) 33.0-33.9, adult: Secondary | ICD-10-CM

## 2023-05-30 DIAGNOSIS — E1159 Type 2 diabetes mellitus with other circulatory complications: Secondary | ICD-10-CM | POA: Diagnosis present

## 2023-05-30 DIAGNOSIS — E1169 Type 2 diabetes mellitus with other specified complication: Secondary | ICD-10-CM | POA: Diagnosis present

## 2023-05-30 DIAGNOSIS — Z794 Long term (current) use of insulin: Secondary | ICD-10-CM

## 2023-05-30 DIAGNOSIS — Z89422 Acquired absence of other left toe(s): Secondary | ICD-10-CM

## 2023-05-30 DIAGNOSIS — B9562 Methicillin resistant Staphylococcus aureus infection as the cause of diseases classified elsewhere: Secondary | ICD-10-CM | POA: Diagnosis present

## 2023-05-30 DIAGNOSIS — Z89421 Acquired absence of other right toe(s): Secondary | ICD-10-CM

## 2023-05-30 DIAGNOSIS — E871 Hypo-osmolality and hyponatremia: Secondary | ICD-10-CM | POA: Insufficient documentation

## 2023-05-30 DIAGNOSIS — I152 Hypertension secondary to endocrine disorders: Secondary | ICD-10-CM | POA: Diagnosis present

## 2023-05-30 DIAGNOSIS — F17220 Nicotine dependence, chewing tobacco, uncomplicated: Secondary | ICD-10-CM | POA: Diagnosis present

## 2023-05-30 LAB — CBC WITH DIFFERENTIAL/PLATELET
Abs Immature Granulocytes: 0.11 10*3/uL — ABNORMAL HIGH (ref 0.00–0.07)
Basophils Absolute: 0 10*3/uL (ref 0.0–0.1)
Basophils Relative: 0 %
Eosinophils Absolute: 0.3 10*3/uL (ref 0.0–0.5)
Eosinophils Relative: 2 %
HCT: 41.5 % (ref 39.0–52.0)
Hemoglobin: 14.2 g/dL (ref 13.0–17.0)
Immature Granulocytes: 1 %
Lymphocytes Relative: 10 %
Lymphs Abs: 1.8 10*3/uL (ref 0.7–4.0)
MCH: 28.7 pg (ref 26.0–34.0)
MCHC: 34.2 g/dL (ref 30.0–36.0)
MCV: 83.8 fL (ref 80.0–100.0)
Monocytes Absolute: 1.3 10*3/uL — ABNORMAL HIGH (ref 0.1–1.0)
Monocytes Relative: 7 %
Neutro Abs: 14.3 10*3/uL — ABNORMAL HIGH (ref 1.7–7.7)
Neutrophils Relative %: 80 %
Platelets: 231 10*3/uL (ref 150–400)
RBC: 4.95 MIL/uL (ref 4.22–5.81)
RDW: 12.3 % (ref 11.5–15.5)
WBC: 17.8 10*3/uL — ABNORMAL HIGH (ref 4.0–10.5)
nRBC: 0 % (ref 0.0–0.2)

## 2023-05-30 LAB — GLUCOSE, CAPILLARY: Glucose-Capillary: 262 mg/dL — ABNORMAL HIGH (ref 70–99)

## 2023-05-30 LAB — BASIC METABOLIC PANEL
Anion gap: 8 (ref 5–15)
BUN: 10 mg/dL (ref 6–20)
CO2: 25 mmol/L (ref 22–32)
Calcium: 9.1 mg/dL (ref 8.9–10.3)
Chloride: 97 mmol/L — ABNORMAL LOW (ref 98–111)
Creatinine, Ser: 0.77 mg/dL (ref 0.61–1.24)
GFR, Estimated: >60 mL/min (ref >60)
Glucose, Bld: 385 mg/dL — ABNORMAL HIGH (ref 70–99)
Potassium: 4.1 mmol/L (ref 3.5–5.1)
Sodium: 130 mmol/L — ABNORMAL LOW (ref 135–145)

## 2023-05-30 LAB — PROTIME-INR
INR: 1 (ref 0.8–1.2)
Prothrombin Time: 12.9 s (ref 11.4–15.2)

## 2023-05-30 LAB — LACTIC ACID, PLASMA
Lactic Acid, Venous: 2 mmol/L (ref 0.5–1.9)
Lactic Acid, Venous: 2.1 mmol/L (ref 0.5–1.9)

## 2023-05-30 LAB — APTT: aPTT: 25 s (ref 24–36)

## 2023-05-30 MED ORDER — VANCOMYCIN HCL 1250 MG/250ML IV SOLN
1250.0000 mg | Freq: Two times a day (BID) | INTRAVENOUS | Status: DC
  Administered 2023-05-31 – 2023-06-03 (×7): 1250 mg via INTRAVENOUS
  Filled 2023-05-30 (×7): qty 250

## 2023-05-30 MED ORDER — MORPHINE SULFATE (PF) 4 MG/ML IV SOLN
4.0000 mg | Freq: Once | INTRAVENOUS | Status: AC
  Administered 2023-05-30: 4 mg via INTRAVENOUS
  Filled 2023-05-30: qty 1

## 2023-05-30 MED ORDER — ATORVASTATIN CALCIUM 80 MG PO TABS
80.0000 mg | ORAL_TABLET | Freq: Every day | ORAL | Status: DC
  Administered 2023-05-30 – 2023-06-02 (×4): 80 mg via ORAL
  Filled 2023-05-30 (×4): qty 1

## 2023-05-30 MED ORDER — LISINOPRIL 10 MG PO TABS
10.0000 mg | ORAL_TABLET | Freq: Every day | ORAL | Status: DC
  Administered 2023-05-31 – 2023-06-03 (×4): 10 mg via ORAL
  Filled 2023-05-30 (×4): qty 1

## 2023-05-30 MED ORDER — ACETAMINOPHEN 650 MG RE SUPP: 650.0000 mg | Freq: Four times a day (QID) | RECTAL | Status: DC | PRN

## 2023-05-30 MED ORDER — VANCOMYCIN HCL IN DEXTROSE 1-5 GM/200ML-% IV SOLN: 1000.0000 mg | Freq: Once | INTRAVENOUS | Status: DC

## 2023-05-30 MED ORDER — INSULIN GLARGINE-YFGN 100 UNIT/ML ~~LOC~~ SOLN
10.0000 [IU] | Freq: Two times a day (BID) | SUBCUTANEOUS | Status: DC
  Administered 2023-05-30 – 2023-05-31 (×3): 10 [IU] via SUBCUTANEOUS
  Filled 2023-05-30 (×5): qty 0.1

## 2023-05-30 MED ORDER — IOHEXOL 300 MG/ML  SOLN
100.0000 mL | Freq: Once | INTRAMUSCULAR | Status: AC | PRN
  Administered 2023-05-30: 75 mL via INTRAVENOUS

## 2023-05-30 MED ORDER — ENOXAPARIN SODIUM 40 MG/0.4ML IJ SOSY
40.0000 mg | PREFILLED_SYRINGE | INTRAMUSCULAR | Status: DC
  Administered 2023-05-30: 40 mg via SUBCUTANEOUS
  Filled 2023-05-30: qty 0.4

## 2023-05-30 MED ORDER — VANCOMYCIN HCL 500 MG IV SOLR
500.0000 mg | Freq: Once | INTRAVENOUS | Status: AC
  Administered 2023-05-30: 500 mg via INTRAVENOUS

## 2023-05-30 MED ORDER — ONDANSETRON HCL 4 MG PO TABS: 4.0000 mg | ORAL_TABLET | Freq: Four times a day (QID) | ORAL | Status: DC | PRN

## 2023-05-30 MED ORDER — VANCOMYCIN HCL IN DEXTROSE 1-5 GM/200ML-% IV SOLN
1000.0000 mg | Freq: Once | INTRAVENOUS | Status: AC
  Administered 2023-05-30: 1000 mg via INTRAVENOUS
  Filled 2023-05-30: qty 200

## 2023-05-30 MED ORDER — INSULIN ASPART 100 UNIT/ML IJ SOLN
0.0000 [IU] | Freq: Three times a day (TID) | INTRAMUSCULAR | Status: DC
  Administered 2023-05-31: 3 [IU] via SUBCUTANEOUS
  Administered 2023-05-31: 8 [IU] via SUBCUTANEOUS
  Administered 2023-05-31: 5 [IU] via SUBCUTANEOUS
  Administered 2023-06-01: 2 [IU] via SUBCUTANEOUS
  Administered 2023-06-01: 11 [IU] via SUBCUTANEOUS
  Administered 2023-06-02 (×2): 2 [IU] via SUBCUTANEOUS

## 2023-05-30 MED ORDER — LACTATED RINGERS IV BOLUS (SEPSIS)
1000.0000 mL | Freq: Once | INTRAVENOUS | Status: AC
  Administered 2023-05-30: 1000 mL via INTRAVENOUS

## 2023-05-30 MED ORDER — LACTATED RINGERS IV SOLN: INTRAVENOUS | Status: DC

## 2023-05-30 MED ORDER — OXYCODONE HCL 5 MG PO TABS
5.0000 mg | ORAL_TABLET | ORAL | Status: DC | PRN
  Administered 2023-05-30 – 2023-06-01 (×6): 5 mg via ORAL
  Filled 2023-05-30 (×6): qty 1

## 2023-05-30 MED ORDER — ACETAMINOPHEN 500 MG PO TABS: 1000.0000 mg | ORAL_TABLET | Freq: Four times a day (QID) | ORAL | Status: DC | PRN

## 2023-05-30 MED ORDER — SENNOSIDES-DOCUSATE SODIUM 8.6-50 MG PO TABS: 1.0000 | ORAL_TABLET | Freq: Every evening | ORAL | Status: DC | PRN

## 2023-05-30 MED ORDER — INSULIN ASPART 100 UNIT/ML IJ SOLN
0.0000 [IU] | Freq: Every day | INTRAMUSCULAR | Status: DC
  Administered 2023-05-30 – 2023-05-31 (×2): 3 [IU] via SUBCUTANEOUS

## 2023-05-30 MED ORDER — FAMOTIDINE 20 MG PO TABS
20.0000 mg | ORAL_TABLET | Freq: Every day | ORAL | Status: DC
  Administered 2023-05-30 – 2023-06-02 (×4): 20 mg via ORAL
  Filled 2023-05-30 (×4): qty 1

## 2023-05-30 MED ORDER — SODIUM CHLORIDE 0.9 % IV SOLN
3.0000 g | Freq: Four times a day (QID) | INTRAVENOUS | Status: DC
  Administered 2023-05-30 – 2023-06-01 (×8): 3 g via INTRAVENOUS
  Filled 2023-05-30 (×9): qty 8

## 2023-05-30 MED ORDER — GABAPENTIN 300 MG PO CAPS
300.0000 mg | ORAL_CAPSULE | Freq: Three times a day (TID) | ORAL | Status: DC
  Administered 2023-05-30: 300 mg via ORAL
  Filled 2023-05-30 (×2): qty 1

## 2023-05-30 MED ORDER — NAPROXEN 250 MG PO TABS
500.0000 mg | ORAL_TABLET | Freq: Two times a day (BID) | ORAL | Status: DC
  Administered 2023-05-31 – 2023-06-03 (×7): 500 mg via ORAL
  Filled 2023-05-30 (×8): qty 2

## 2023-05-30 MED ORDER — ONDANSETRON HCL 4 MG/2ML IJ SOLN: 4.0000 mg | Freq: Four times a day (QID) | INTRAMUSCULAR | Status: DC | PRN

## 2023-05-30 MED ORDER — MORPHINE SULFATE (PF) 2 MG/ML IV SOLN
2.0000 mg | INTRAVENOUS | Status: AC | PRN
  Administered 2023-05-30 – 2023-05-31 (×4): 2 mg via INTRAVENOUS
  Filled 2023-05-30 (×4): qty 1

## 2023-05-30 NOTE — ED Notes (Signed)
Pt refused EKG. PA notified and at bedside with pt.

## 2023-05-30 NOTE — Progress Notes (Signed)
Pharmacy Antibiotic Note  Martin [name] is a 57 y.o. male admitted on 05/30/2023 with neck cellulitis with abscess. Pharmacy has been consulted for Vancomycin dosing. Unasyn dosed per MD. Patient is s/p Vancomycin 1500 mg IV in the ED.  Plan: Vancomycin 1250 mg IV q12h (eAUC: 470.2, Scr 0.77) Monitor clinical response, renal function, and de-escalation Vancomycin levels as indicated  Height: 5\' 11"  (180.3 cm) Weight: 109.8 kg (242 lb) IBW/kg (Calculated) : 75.3  Temp (24hrs), Avg:98.2 F (36.8 C), Min:98.1 F (36.7 C), Max:98.3 F (36.8 C)  Recent Labs  Lab 05/30/23 1448 05/30/23 1530  WBC 17.8*  --   CREATININE 0.77  --   LATICACIDVEN  --  2.0*    Estimated Creatinine Clearance: 126.8 mL/min (by C-G formula based on SCr of 0.77 mg/dL).    No Known Allergies  Antimicrobials this admission: Vanc 12/24 >>  Unasyn 12/24 >>   Dose adjustments this admission: N/a  Microbiology results: 05/30/23 BCx: IP  Thank you for allowing pharmacy to be a part of this patient's care.  Lora Paula, PharmD PGY-2 Infectious Diseases Pharmacy Resident [facility] 05/30/2023 7:24 PM

## 2023-05-30 NOTE — Hospital Course (Signed)
Martin [name] is a 57 y.o. male with medical history significant for insulin-dependent T2DM, HTN, HLD who is admitted with cellulitis of the right posterior neck.

## 2023-05-30 NOTE — Progress Notes (Signed)
CC: Right neck cellulitis  HPI:  Martin [name] is a 57 y.o. male who was admitted from the [facility] emergency room for treatment of his right neck cellulitis.  According to the patient, his  infection started with a pimple on the back of the right neck.  He first noted the swelling and pain 2 days ago.  It has progressively worsened.  The patient has not noted any purulent drainage.  The patient had an episode of right neck abscess in May 2024.  He underwent incision and drainage of the abscess by Dr. Suzanna Obey.  He was also treated with doxycycline and vancomycin.  His current infection is separate from his previous neck abscess.  The patient has a history of insulin-dependent diabetes.  Past Medical History:  Diagnosis Date   Arthritis    Chest pain    Diabetes mellitus    Gout    History of kidney stones    Hyperlipidemia    Hypertension    Obesity     Past Surgical History:  Procedure Laterality Date   AMPUTATION TOE Right 01/23/2021   Procedure: AMPUTATION OF HALLUX;  Surgeon: Vivi Barrack, DPM;  Location: [facility];  Service: Podiatry;  Laterality: Right;   AMPUTATION TOE Left 07/29/2022   Procedure: AMPUTATION TOE FIRST;  Surgeon: Vivi Barrack, DPM;  Location: [facility];  Service: Podiatry;  Laterality: Left;   UMBILICAL HERNIA REPAIR      Family History  Problem Relation Age of Onset   Diabetes Maternal Grandfather    Kidney disease Other     Social History:  reports that he has never smoked. His smokeless tobacco use includes chew. He reports current alcohol use. He reports that he does not use drugs.  Allergies: No Known Allergies  Medications: I have reviewed the patient's current medications. Scheduled:  atorvastatin  80 mg Oral QHS   enoxaparin (LOVENOX) injection  40 mg Subcutaneous Q24H   famotidine  20 mg Oral Daily   gabapentin  300 mg Oral TID   insulin aspart  0-15 Units Subcutaneous TID WC   insulin aspart  0-5 Units Subcutaneous QHS    insulin glargine-yfgn  10 Units Subcutaneous BID   lisinopril  10 mg Oral Daily   naproxen  500 mg Oral BID WC   Continuous:  ampicillin-sulbactam (UNASYN) IV 3 g (05/30/23 1939)   lactated ringers 150 mL/hr at 05/30/23 1654   vancomycin     DGL:OVFIEPPIRJJOA **OR** acetaminophen, morphine injection, ondansetron **OR** ondansetron (ZOFRAN) IV, oxyCODONE, senna-docusate  Results for orders placed or performed during the hospital encounter of 05/30/23 (from the past 48 hours)  CBC with Differential     Status: Abnormal   Collection Time: 05/30/23  2:48 PM  Result Value Ref Range   WBC 17.8 (H) 4.0 - 10.5 K/uL   RBC 4.95 4.22 - 5.81 MIL/uL   Hemoglobin 14.2 13.0 - 17.0 g/dL   HCT 41.6 60.6 - 30.1 %   MCV 83.8 80.0 - 100.0 fL   MCH 28.7 26.0 - 34.0 pg   MCHC 34.2 30.0 - 36.0 g/dL   RDW 60.1 09.3 - 23.5 %   Platelets 231 150 - 400 K/uL   nRBC 0.0 0.0 - 0.2 %   Neutrophils Relative % 80 %   Neutro Abs 14.3 (H) 1.7 - 7.7 K/uL   Lymphocytes Relative 10 %   Lymphs Abs 1.8 0.7 - 4.0 K/uL   Monocytes Relative 7 %   Monocytes Absolute 1.3 (H)  0.1 - 1.0 K/uL   Eosinophils Relative 2 %   Eosinophils Absolute 0.3 0.0 - 0.5 K/uL   Basophils Relative 0 %   Basophils Absolute 0.0 0.0 - 0.1 K/uL   Immature Granulocytes 1 %   Abs Immature Granulocytes 0.11 (H) 0.00 - 0.07 K/uL    Comment: Performed at [facility], [address] [place], [place] [address]  Basic metabolic panel     Status: Abnormal   Collection Time: 05/30/23  2:48 PM  Result Value Ref Range   Sodium 130 (L) 135 - 145 mmol/L   Potassium 4.1 3.5 - 5.1 mmol/L   Chloride 97 (L) 98 - 111 mmol/L   CO2 25 22 - 32 mmol/L   Glucose, Bld 385 (H) 70 - 99 mg/dL    Comment: Glucose reference range applies only to samples taken after fasting for at least 8 hours.   BUN 10 6 - 20 mg/dL   Creatinine, Ser 6.30 0.61 - 1.24 mg/dL   Calcium 9.1 8.9 - 16.0 mg/dL   GFR, Estimated >10 >93 mL/min    Comment:  (NOTE) Calculated using the CKD-EPI Creatinine Equation (2021)    Anion gap 8 5 - 15    Comment: Performed at [facility], [address], [place], [place] [address]  Lactic acid, plasma     Status: Abnormal   Collection Time: 05/30/23  3:30 PM  Result Value Ref Range   Lactic Acid, Venous 2.0 (HH) 0.5 - 1.9 mmol/L    Comment: CRITICAL RESULT CALLED TO, READ BACK BY AND VERIFIED WITH: MAS,C AT 1613 ON 05/30/2023 BY MOSLEY,J Performed at [facility], [address], [place], [place] [address]   Protime-INR     Status: None   Collection Time: 05/30/23  3:30 PM  Result Value Ref Range   Prothrombin Time 12.9 11.4 - 15.2 seconds   INR 1.0 0.8 - 1.2    Comment: (NOTE) INR goal varies based on device and disease states. Performed at [facility], [address], [place], [place] [address]   APTT     Status: None   Collection Time: 05/30/23  3:30 PM  Result Value Ref Range   aPTT 25 24 - 36 seconds    Comment: Performed at [facility], [address], [place], [place] [address]  Lactic acid, plasma     Status: Abnormal   Collection Time: 05/30/23  7:15 PM  Result Value Ref Range   Lactic Acid, Venous 2.1 (HH) 0.5 - 1.9 mmol/L    Comment: CRITICAL RESULT CALLED TO, READ BACK BY AND VERIFIED WITH Bertram Gala, RN @ [phone] 05/30/23 BY [facility] Performed at [facility], [address] [place], [place] [address]   Glucose, capillary     Status: Abnormal   Collection Time: 05/30/23  9:39 PM  Result Value Ref Range   Glucose-Capillary 262 (H) 70 - 99 mg/dL    Comment: Glucose reference range applies only to samples taken after fasting for at least 8 hours.   Comment 1 Notify RN    Comment 2 Document in Chart     CT Soft Tissue Neck W Contrast Result Date: 05/30/2023 CLINICAL DATA:  Neck mass, nonpulsatile. Abscess of posterior neck. Symptoms worsening over the last 2 days. Redness and swelling. EXAM:  CT NECK WITH CONTRAST TECHNIQUE: Multidetector CT imaging of the neck was performed using the standard protocol following the bolus administration of intravenous contrast. RADIATION DOSE REDUCTION: This exam was performed  according to the departmental dose-optimization program which includes automated exposure control, adjustment of the mA and/or kV according to patient size and/or use of iterative reconstruction technique. CONTRAST:  75mL OMNIPAQUE IOHEXOL 300 MG/ML  SOLN COMPARISON:  10/17/2022 FINDINGS: Pharynx and larynx: No mucosal or submucosal lesion. Salivary glands: Parotid and submandibular glands are normal. Thyroid: Normal Lymph nodes: No enlarged or low-density nodes on either side of the neck. Vascular: Normal Limited intracranial: Chronic arachnoid cyst on the left as seen previously, not primarily evaluated. Visualized orbits: Normal Mastoids and visualized paranasal sinuses: Clear Skeleton: Mild cervical spondylosis. Upper chest: Lung apices are clear. Other: Edematous change throughout the subcutaneous fat of the right posterior neck consistent with regional cellulitis. Overlying skin thickening. Question few small punctate bits of air/gas which could be subsequent to the infection or could relate to attempted lancing or drainage. There is not a well-circumscribed or apparently drainable fluid collection in the region. No evidence of deep space extension. IMPRESSION: Edematous change throughout the subcutaneous fat of the right posterior neck consistent with regional cellulitis. Overlying skin thickening. Question few small punctate bit of air/gas which could be subsequent to the infection or could relate to attempted lancing or drainage. There is not a well-circumscribed or apparently drainable fluid collection in the region. No evidence of deep space extension. Electronically Signed   By: Paulina Fusi M.D.   On: 05/30/2023 16:11   Review of Systems:  All systems reviewed and are negative  except as documented in history of present illness above.   Blood pressure (!) 158/98, pulse 98, temperature 97.8 F (36.6 C), temperature source Oral, resp. rate 16, height 5\' 11"  (1.803 m), weight 109.8 kg, SpO2 96%. Physical Exam Eyes: Pupils are equal, round, reactive to light. Extraocular motion is intact.  Ears: Examination of the ears shows normal auricles and external auditory canals bilaterally. Both tympanic membranes are intact.  Nose: Nasal examination shows normal mucosa, septum, turbinates.  Face: Facial examination shows no asymmetry. Palpation of the face elicit no significant tenderness.  Mouth: Oral cavity examination shows no mucosal lacerations. No significant trismus is noted.  Neck: Diffusely edematous and erythematous induration noted on the right posterior neck.  No fluctuance is noted.  The trachea is midline. The thyroid is not significantly enlarged.  Neuro: Cranial nerves 2-12 are all grossly in tact.   Assessment/Plan: Acute right posterior neck cellulitis.  His current infection is separate from his previous neck abscess, which was drained by Dr. Suzanna Obey. -His CT scan showed no drainable abscess. -Agree with IV antibiotic treatment.  The patient is currently on vancomycin and Unasyn. -May convert to oral antibiotic if he clinically improves.  Fahmida Jurich W Vianey Caniglia 05/31/2023, 12:01 AM

## 2023-05-30 NOTE — H&P (Signed)
History and Physical    [name]:[id number] DOB: May 16, 1966 DOA: 05/30/2023  PCP: [facility]  Patient coming from: Home  I have personally briefly reviewed patient's old medical records in [facility]  Chief Complaint: Neck pain and swelling  HPI: Martin [name] is a 57 y.o. male with medical history significant for insulin-dependent T2DM, HTN, HLD who presented to the ED for evaluation of neck pain and swelling.  Patient states that 2 days ago he noticed a pimple with a whitehead on the back of the right part of his neck.  He attempted to pop the area itself but nothing came out.  Over the last 2 days the surrounding area has become more swollen and painful.  He has not seen any active drainage from the area.  He denies fevers, chills, diaphoresis, chest pain, dyspnea.  He states that he often gets pimples after getting his haircut.  He had a similar issue in May 2024 required admission for right neck abscess.  He was seen by ENT who performed I&D.  Culture came back positive for MRSA.  He was treated with vancomycin and hospital and discharged on doxycycline.  Again had similar issue late October this year.  He was prescribed a course of doxycycline with improvement.  [facility] ED Course  Labs/Imaging on admission: I have personally reviewed following labs and imaging studies.  Initial vitals showed BP 148/72, pulse 90, RR 16, temp 98.1 F, SpO2 100% on room air.  Labs showed WBC 17.8, hemoglobin 14.2, platelets 231,000, sodium 130 (137 when corrected for hyperglycemia), potassium 4.1, bicarb 25, BUN 10, creatinine 0.77, serum glucose 385, lactic acid 2.0.  Blood cultures in process.  CT soft tissue neck with contrast showed edematous change throughout the subcutaneous fat of the right posterior neck consistent with regional cellulitis.  Overlying skin thickening.  Question few small punctate bit of air/gas which could be  subsequent to the infection or could relate to attempted lancing or drainage.  There is not a well circumscribed or apparently drainable fluid collection in the region.  No evidence of deep space extension.  Patient was given 1 L LR, IV vancomycin and morphine.  EDP discussed with ENT Dr. Suszanne Conners who recommended medical admission and they will see in consultation.  The hospitalist service was consulted to admit.  Review of Systems: All systems reviewed and are negative except as documented in history of present illness above.   Past Medical History:  Diagnosis Date   Arthritis    Chest pain    Diabetes mellitus    Gout    History of kidney stones    Hyperlipidemia    Hypertension    Obesity     Past Surgical History:  Procedure Laterality Date   AMPUTATION TOE Right 01/23/2021   Procedure: AMPUTATION OF HALLUX;  Surgeon: Vivi Barrack, DPM;  Location: [facility];  Service: Podiatry;  Laterality: Right;   AMPUTATION TOE Left 07/29/2022   Procedure: AMPUTATION TOE FIRST;  Surgeon: Vivi Barrack, DPM;  Location: [facility];  Service: Podiatry;  Laterality: Left;   UMBILICAL HERNIA REPAIR      Social History:  reports that he has never smoked. His smokeless tobacco use includes chew. He reports current alcohol use. He reports that he does not use drugs.  No Known Allergies  Family History  Problem Relation Age of Onset   Diabetes Maternal Grandfather    Kidney disease Other  Prior to Admission medications   Medication Sig Start Date End Date Taking? Authorizing Provider  atorvastatin (LIPITOR) 80 MG tablet Take 1 tablet (80 mg total) by mouth at bedtime. 11/23/22     colchicine 0.6 MG tablet Take 2 tablets once and then 1 tablet 1 hour later once for 1 day for gouty flare-ups. 06/27/22     Continuous Blood Gluc Sensor (DEXCOM G7 SENSOR) MISC Change 1 sensor every 10 days on insulin 3 times daily 01/13/22     Continuous Glucose Receiver (FREESTYLE LIBRE 3 READER) DEVI Use 4  (four) times daily. 11/23/22     Continuous Glucose Sensor (FREESTYLE LIBRE 3 SENSOR) MISC Change sensor every 14 days. 11/23/22     cyclobenzaprine (FLEXERIL) 10 MG tablet Take 1 tablet (10 mg total) by mouth 2 (two) times daily as needed for muscle spasms. 11/08/21   Horton, Clabe Seal, DO  Dulaglutide (TRULICITY) 1.5 MG/0.5ML SOPN Inject 1.5 mg into the skin once a week. Patient not taking: Reported on 07/26/2022 04/27/21     empagliflozin (JARDIANCE) 25 MG TABS tablet Take 1 tablet (25 mg total) by mouth daily. 03/29/22     empagliflozin (JARDIANCE) 25 MG TABS tablet Take 1 tablet (25 mg total) by mouth daily. 11/23/22     furosemide (LASIX) 20 MG tablet Take 1 tablet (20 mg) daily until 10/23/2022; THEN, take 1 tablet (20 mg) daily every Saturday and Sunday thereafter. Take 1 extra tablet for weight gain of 3 lbs in 1 day or 5 lbs in 2 days as needed. 10/20/22   Rolly Salter, MD  gabapentin (NEURONTIN) 300 MG capsule Take 1 capsule (300 mg total) by mouth 3 (three) times daily. 10/20/22   Rolly Salter, MD  glucose blood (FREESTYLE LITE) test strip Use three times daily as directed 03/09/21     HYDROcodone-acetaminophen (NORCO/VICODIN) 5-325 MG tablet Take 1 tablet by mouth every 6 (six) hours as needed for moderate pain or severe pain. 10/20/22   Rolly Salter, MD  ibuprofen (ADVIL) 800 MG tablet Take 1 tablet (800 mg total) by mouth every 8 (eight) hours as needed for pain 10/11/22   Arna Snipe, MD  insulin degludec (TRESIBA FLEXTOUCH) 200 UNIT/ML FlexTouch Pen Inject 110 Units into the skin daily. 10/20/22   Rolly Salter, MD  insulin degludec (TRESIBA FLEXTOUCH) 200 UNIT/ML FlexTouch Pen Inject 100 Units into the skin daily. 11/14/22     insulin lispro (HUMALOG) 100 UNIT/ML KwikPen Inject 8 Units into the skin 3 (three) times daily with meals. Only take if eating a meal AND Blood Glucose (BG) is 80 or higher. 10/20/22   Rolly Salter, MD  Insulin Pen Needle 32G X 6 MM MISC Use to  inject insulin 2 times a day for 90 days. 12/24/21   Talmage Coin, MD  Lancets (FREESTYLE) lancets Use three times daily as directed 03/09/21   Talmage Coin, MD  lisinopril (ZESTRIL) 10 MG tablet Take 1 tablet (10 mg total) by mouth daily. 02/04/22     lisinopril (ZESTRIL) 10 MG tablet Take 1 tablet (10 mg total) by mouth daily. 11/23/22     meloxicam (MOBIC) 15 MG tablet Take 1 tablet (15 mg total) by mouth daily. 06/27/22     meloxicam (MOBIC) 15 MG tablet Take 1 tablet by mouth once a day after meals FOR 7-10 DAYS THEN AS NEEDED AFTER THAT 12/09/22     Menthol-Methyl Salicylate (MUSCLE RUB) 10-15 % CREA Apply 1 Application topically as needed for muscle  pain. 10/20/22   Rolly Salter, MD  metFORMIN (GLUCOPHAGE-XR) 500 MG 24 hr tablet TAKE 2 TABLETS BY MOUTH WITH A MEAL TWICE A DAY 90 days 10/25/22     metFORMIN (GLUCOPHAGE-XR) 500 MG 24 hr tablet Take 2 tablets (1,000 mg total) by mouth 2 (two) times daily with a meal. 10/25/22     metFORMIN (GLUCOPHAGE-XR) 500 MG 24 hr tablet Take 2 tablets (1,000 mg total) by mouth 2 (two) times daily with a meal. 11/23/22     metFORMIN (GLUCOPHAGE-XR) 500 MG 24 hr tablet Take 2 tablets (1,000 mg total) by mouth 2 (two) times daily with a meal. 04/12/23     metFORMIN (GLUCOPHAGE-XR) 500 MG 24 hr tablet Take 1 tablet (500 mg total) by mouth 2 (two) times daily with a meal twice a day for 90 days. 04/12/23     sildenafil (VIAGRA) 100 MG tablet Take 1 tablet (100 mg total) by mouth up to once daily as needed 12/11/20     tadalafil (CIALIS) 20 MG tablet Take 1 tablet (20 mg total) by mouth one hour before sex as needed for E.D. 03/07/23       Physical Exam: Vitals:   05/30/23 1430 05/30/23 1736 05/30/23 1827 05/30/23 1926  BP: 138/74  (!) 161/87   Pulse:   87   Resp:   16 15  Temp:  98.2 F (36.8 C) 98.3 F (36.8 C) 98.1 F (36.7 C)  TempSrc:  Oral Oral Oral  SpO2:   98%   Weight:      Height:   5\' 11"  (1.803 m)    Constitutional: Resting in bed, NAD Eyes: EOMI,  lids and conjunctivae normal ENMT: Mucous membranes are moist. Posterior pharynx clear of any exudate or lesions.Normal dentition.  Neck: Whitehead right posterior neck with surrounding indurated swelling and erythema, area is tender to light palpation.  No active discharge.  See picture below. Respiratory: clear to auscultation bilaterally, no wheezing, no crackles. Normal respiratory effort. No accessory muscle use.  Cardiovascular: Regular rate and rhythm, no murmurs / rubs / gallops. No extremity edema. 2+ pedal pulses. Abdomen: no tenderness, no masses palpated. Musculoskeletal: no clubbing / cyanosis.  Good ROM, no contractures. Normal muscle tone.  Skin: no rashes, lesions, ulcers. No induration Neurologic: Sensation intact. Strength 5/5 in all 4.  Psychiatric: Alert and oriented x 3. Normal mood.    EKG: Ordered and pending.  Assessment/Plan Principal Problem:   Cellulitis of neck Active Problems:   Insulin dependent type 2 diabetes mellitus (HCC)   Hypertension associated with diabetes (HCC)   Hyperlipidemia associated with type 2 diabetes mellitus (HCC)   LAVELLE STOEVER is a 57 y.o. male with medical history significant for insulin-dependent T2DM, HTN, HLD who is admitted with cellulitis of the right posterior neck.  Assessment and Plan: Cellulitis of the right posterior neck: Recurrent issue with history of MRSA.  WBC 17.8, vitals are stable.  CT with question of few small punctate bit of air/gas but no obvious drainable fluid collection or evidence of deep space extension. -Continue IV vancomycin -Add IV Unasyn -EDP spoke with ENT Dr. Suszanne Conners who will see in consultation -Schedule naproxen in addition to prn analgesics  Insulin-dependent type 2 diabetes: Placed on Semglee 10 units BID plus SSI.  Adjust as necessary.  Hypertension: Continue lisinopril.  Hyperlipidemia: Continue atorvastatin.   DVT prophylaxis: enoxaparin (LOVENOX) injection 40 mg Start: 05/30/23  2100 Code Status: Full code, confirmed with patient on admission Family Communication: Discussed with patient,  he has discussed with family Disposition Plan: From home and likely discharge to home pending clinical progress Consults called: EDP discussed with ENT Dr. Suszanne Conners Severity of Illness: The appropriate patient status for this patient is OBSERVATION. Observation status is judged to be reasonable and necessary in order to provide the required intensity of service to ensure the patient's safety. The patient's presenting symptoms, physical exam findings, and initial radiographic and laboratory data in the context of their medical condition is felt to place them at decreased risk for further clinical deterioration. Furthermore, it is anticipated that the patient will be medically stable for discharge from the hospital within 2 midnights of admission.   Darreld Mclean MD [facility]  If 7PM-7AM, please contact night-coverage [email or web address]  05/30/2023, 9:21 PM

## 2023-05-30 NOTE — ED Provider Notes (Signed)
[place] EMERGENCY DEPARTMENT AT [facility] Provider Note   CSN: [id number] Arrival date & time: 05/30/23  1312     History  Chief Complaint  Patient presents with   Abscess    Martin [name] is a 57 y.o. male.  Patient with history of diabetes, hypertension, hyperlipidemia, obesity presents today with complaints of neck pain, swelling. He states that same began 2 days ago and has been persistently worsening since then. States that 2 days ago he noticed a white head on the back of his neck, he tried to pop the area himself and nothing came out. A short time later he noticed that the area was swelling and becoming more painful. States that he had a similar occurrence back in May and he required admission for IV antibiotics. States he is having chills but no fevers. Notes that he has been in the hospital staying with a sick family member and therefore has not had his medications. Denies shortness of breath or swelling in his mouth or throat area.   The history is provided by the patient. No language interpreter was used.  Abscess      Home Medications Prior to Admission medications   Medication Sig Start Date End Date Taking? Authorizing Provider  atorvastatin (LIPITOR) 80 MG tablet Take 1 tablet (80 mg total) by mouth at bedtime. 11/23/22     colchicine 0.6 MG tablet Take 2 tablets once and then 1 tablet 1 hour later once for 1 day for gouty flare-ups. 06/27/22     Continuous Blood Gluc Sensor (DEXCOM G7 SENSOR) MISC Change 1 sensor every 10 days on insulin 3 times daily 01/13/22     Continuous Glucose Receiver (FREESTYLE LIBRE 3 READER) DEVI Use 4 (four) times daily. 11/23/22     Continuous Glucose Sensor (FREESTYLE LIBRE 3 SENSOR) MISC Change sensor every 14 days. 11/23/22     cyclobenzaprine (FLEXERIL) 10 MG tablet Take 1 tablet (10 mg total) by mouth 2 (two) times daily as needed for muscle spasms. 11/08/21   Horton, Clabe Seal, DO  Dulaglutide (TRULICITY) 1.5 MG/0.5ML  SOPN Inject 1.5 mg into the skin once a week. Patient not taking: Reported on 07/26/2022 04/27/21     empagliflozin (JARDIANCE) 25 MG TABS tablet Take 1 tablet (25 mg total) by mouth daily. 03/29/22     empagliflozin (JARDIANCE) 25 MG TABS tablet Take 1 tablet (25 mg total) by mouth daily. 11/23/22     furosemide (LASIX) 20 MG tablet Take 1 tablet (20 mg) daily until 10/23/2022; THEN, take 1 tablet (20 mg) daily every Saturday and Sunday thereafter. Take 1 extra tablet for weight gain of 3 lbs in 1 day or 5 lbs in 2 days as needed. 10/20/22   Rolly Salter, MD  gabapentin (NEURONTIN) 300 MG capsule Take 1 capsule (300 mg total) by mouth 3 (three) times daily. 10/20/22   Rolly Salter, MD  glucose blood (FREESTYLE LITE) test strip Use three times daily as directed 03/09/21     HYDROcodone-acetaminophen (NORCO/VICODIN) 5-325 MG tablet Take 1 tablet by mouth every 6 (six) hours as needed for moderate pain or severe pain. 10/20/22   Rolly Salter, MD  ibuprofen (ADVIL) 800 MG tablet Take 1 tablet (800 mg total) by mouth every 8 (eight) hours as needed for pain 10/11/22   Arna Snipe, MD  insulin degludec (TRESIBA FLEXTOUCH) 200 UNIT/ML FlexTouch Pen Inject 110 Units into the skin daily. 10/20/22   Rolly Salter, MD  insulin degludec (  TRESIBA FLEXTOUCH) 200 UNIT/ML FlexTouch Pen Inject 100 Units into the skin daily. 11/14/22     insulin lispro (HUMALOG) 100 UNIT/ML KwikPen Inject 8 Units into the skin 3 (three) times daily with meals. Only take if eating a meal AND Blood Glucose (BG) is 80 or higher. 10/20/22   Rolly Salter, MD  Insulin Pen Needle 32G X 6 MM MISC Use to inject insulin 2 times a day for 90 days. 12/24/21   Talmage Coin, MD  Lancets (FREESTYLE) lancets Use three times daily as directed 03/09/21   Talmage Coin, MD  lisinopril (ZESTRIL) 10 MG tablet Take 1 tablet (10 mg total) by mouth daily. 02/04/22     lisinopril (ZESTRIL) 10 MG tablet Take 1 tablet (10 mg total) by mouth  daily. 11/23/22     meloxicam (MOBIC) 15 MG tablet Take 1 tablet (15 mg total) by mouth daily. 06/27/22     meloxicam (MOBIC) 15 MG tablet Take 1 tablet by mouth once a day after meals FOR 7-10 DAYS THEN AS NEEDED AFTER THAT 12/09/22     Menthol-Methyl Salicylate (MUSCLE RUB) 10-15 % CREA Apply 1 Application topically as needed for muscle pain. 10/20/22   Rolly Salter, MD  metFORMIN (GLUCOPHAGE-XR) 500 MG 24 hr tablet TAKE 2 TABLETS BY MOUTH WITH A MEAL TWICE A DAY 90 days 10/25/22     metFORMIN (GLUCOPHAGE-XR) 500 MG 24 hr tablet Take 2 tablets (1,000 mg total) by mouth 2 (two) times daily with a meal. 10/25/22     metFORMIN (GLUCOPHAGE-XR) 500 MG 24 hr tablet Take 2 tablets (1,000 mg total) by mouth 2 (two) times daily with a meal. 11/23/22     metFORMIN (GLUCOPHAGE-XR) 500 MG 24 hr tablet Take 2 tablets (1,000 mg total) by mouth 2 (two) times daily with a meal. 04/12/23     metFORMIN (GLUCOPHAGE-XR) 500 MG 24 hr tablet Take 1 tablet (500 mg total) by mouth 2 (two) times daily with a meal twice a day for 90 days. 04/12/23     sildenafil (VIAGRA) 100 MG tablet Take 1 tablet (100 mg total) by mouth up to once daily as needed 12/11/20     tadalafil (CIALIS) 20 MG tablet Take 1 tablet (20 mg total) by mouth one hour before sex as needed for E.D. 03/07/23         Allergies    Patient has no known allergies.    Review of Systems   Review of Systems  Skin:  Positive for wound.  All other systems reviewed and are negative.   Physical Exam Updated Vital Signs BP 138/74   Pulse 90   Temp 98.1 F (36.7 C) (Oral)   Resp 16   Ht 5\' 11"  (1.803 m)   Wt 109.8 kg   SpO2 100%   BMI 33.75 kg/m  Physical Exam Vitals and nursing note reviewed.  Constitutional:      General: He is not in acute distress.    Appearance: Normal appearance. He is normal weight. He is not ill-appearing, toxic-appearing or diaphoretic.  HENT:     Head: Normocephalic and atraumatic.     Mouth/Throat:     Comments: Tolerating  secretions without issue. No intraoral swelling Neck:     Comments: Erythema, warmth, and induration noted to the right posterior neck area tracking to the lateral neck area as well. No fluctuance, crepitus, or drainage. See images below for further Cardiovascular:     Rate and Rhythm: Normal rate and regular rhythm.  Heart sounds: Normal heart sounds.  Pulmonary:     Effort: Pulmonary effort is normal. No respiratory distress.     Breath sounds: Normal breath sounds. No stridor.  Musculoskeletal:        General: Normal range of motion.     Cervical back: Normal range of motion and neck supple. No rigidity.  Skin:    General: Skin is warm and dry.  Neurological:     General: No focal deficit present.     Mental Status: He is alert.  Psychiatric:        Mood and Affect: Mood normal.        Behavior: Behavior normal.     ED Results / Procedures / Treatments   Labs (all labs ordered are listed, but only abnormal results are displayed) Labs Reviewed  CBC WITH DIFFERENTIAL/PLATELET - Abnormal; Notable for the following components:      Result Value   WBC 17.8 (*)    Neutro Abs 14.3 (*)    Monocytes Absolute 1.3 (*)    Abs Immature Granulocytes 0.11 (*)    All other components within normal limits  BASIC METABOLIC PANEL - Abnormal; Notable for the following components:   Sodium 130 (*)    Chloride 97 (*)    Glucose, Bld 385 (*)    All other components within normal limits  LACTIC ACID, PLASMA - Abnormal; Notable for the following components:   Lactic Acid, Venous 2.0 (*)    All other components within normal limits  CULTURE, BLOOD (ROUTINE X 2)  CULTURE, BLOOD (ROUTINE X 2)  PROTIME-INR  APTT  LACTIC ACID, PLASMA    EKG None  Radiology CT Soft Tissue Neck W Contrast Result Date: 05/30/2023 CLINICAL DATA:  Neck mass, nonpulsatile. Abscess of posterior neck. Symptoms worsening over the last 2 days. Redness and swelling. EXAM: CT NECK WITH CONTRAST TECHNIQUE:  Multidetector CT imaging of the neck was performed using the standard protocol following the bolus administration of intravenous contrast. RADIATION DOSE REDUCTION: This exam was performed according to the departmental dose-optimization program which includes automated exposure control, adjustment of the mA and/or kV according to patient size and/or use of iterative reconstruction technique. CONTRAST:  75mL OMNIPAQUE IOHEXOL 300 MG/ML  SOLN COMPARISON:  10/17/2022 FINDINGS: Pharynx and larynx: No mucosal or submucosal lesion. Salivary glands: Parotid and submandibular glands are normal. Thyroid: Normal Lymph nodes: No enlarged or low-density nodes on either side of the neck. Vascular: Normal Limited intracranial: Chronic arachnoid cyst on the left as seen previously, not primarily evaluated. Visualized orbits: Normal Mastoids and visualized paranasal sinuses: Clear Skeleton: Mild cervical spondylosis. Upper chest: Lung apices are clear. Other: Edematous change throughout the subcutaneous fat of the right posterior neck consistent with regional cellulitis. Overlying skin thickening. Question few small punctate bits of air/gas which could be subsequent to the infection or could relate to attempted lancing or drainage. There is not a well-circumscribed or apparently drainable fluid collection in the region. No evidence of deep space extension. IMPRESSION: Edematous change throughout the subcutaneous fat of the right posterior neck consistent with regional cellulitis. Overlying skin thickening. Question few small punctate bit of air/gas which could be subsequent to the infection or could relate to attempted lancing or drainage. There is not a well-circumscribed or apparently drainable fluid collection in the region. No evidence of deep space extension. Electronically Signed   By: Paulina Fusi M.D.   On: 05/30/2023 16:11    Procedures .Critical Care  Performed by: Silva Bandy,  PA-C Authorized by: Silva Bandy, PA-C   Critical care provider statement:    Critical care time (minutes):  35   Critical care was necessary to treat or prevent imminent or life-threatening deterioration of the following conditions:  Sepsis   Critical care was time spent personally by me on the following activities:  Development of treatment plan with patient or surrogate, discussions with consultants, discussions with primary provider, evaluation of patient's response to treatment, examination of patient, obtaining history from patient or surrogate, ordering and review of laboratory studies, ordering and review of radiographic studies, pulse oximetry, re-evaluation of patient's condition and review of old charts   Care discussed with: admitting provider       Medications Ordered in ED Medications  lactated ringers infusion (has no administration in time range)  lactated ringers bolus 1,000 mL (1,000 mLs Intravenous New Bag/Given 05/30/23 1524)  vancomycin (VANCOCIN) IVPB 1000 mg/200 mL premix (1,000 mg Intravenous New Bag/Given 05/30/23 1526)    Followed by  vancomycin (VANCOCIN) 500 mg in sodium chloride 0.9 % 100 mL IVPB (has no administration in time range)  morphine (PF) 4 MG/ML injection 4 mg (has no administration in time range)  iohexol (OMNIPAQUE) 300 MG/ML solution 100 mL (75 mLs Intravenous Contrast Given 05/30/23 1553)    ED Course/ Medical Decision Making/ A&P                                 Medical Decision Making Amount and/or Complexity of Data Reviewed Labs: ordered. Radiology: ordered. ECG/medicine tests: ordered.  Risk Prescription drug management.   This patient is a 57 y.o. male who presents to the ED for concern of neck swelling, pain, this involves an extensive number of treatment options, and is a complaint that carries with it a high risk of complications and morbidity. The emergent differential diagnosis prior to evaluation includes, but is not limited to,  abscess, cellulitis,  necrotizing fascitis. This is not an exhaustive differential.   Past Medical History / Co-morbidities / Social History:  has a past medical history of Arthritis, Chest pain, Diabetes mellitus, Gout, History of kidney stones, Hyperlipidemia, Hypertension, and Obesity.  Additional history: Chart reviewed. Pertinent results include: patient admitted for neck abscess in May 2024 after failure of outpatient antibiotics. ENT Dr. Jearld Fenton drained the area and MRSA grew. Given IV vancomycin and rocephin  Physical Exam: Physical exam performed. The pertinent findings include: per above images, erythema, warmth, and induration noted to the right posterior neck area tracking to the lateral neck area as well. No fluctuance, crepitus, or drainage. No intraoral swelling or stridor  Lab Tests: I ordered, and personally interpreted labs.  The pertinent results include:  WBC 17.8, glucose 385, lactic 2.0   Imaging Studies: I ordered imaging studies including CT soft tissue neck. I independently visualized and interpreted imaging which showed   Edematous change throughout the subcutaneous fat of the right posterior neck consistent with regional cellulitis. Overlying skin thickening. Question few small punctate bit of air/gas which could be subsequent to the infection or could relate to attempted lancing or drainage. There is not a well-circumscribed or apparently drainable fluid collection in the region. No evidence of deep space extension.   I agree with the radiologist interpretation.   Medications: I ordered medication including vancomycin, rocephin, fluids  for sepsis, neck cellulitis. Reevaluation of the patient after these medicines showed that the patient stayed the same. I have  reviewed the patients home medicines and have made adjustments as needed.  Consultations Obtained: I requested consultation with the ENT Dr. Suszanne Conners,  and discussed lab and imaging findings as well as pertinent plan - they  recommend: admit to medicine, they will see the patient. No further requests at this time.   Disposition: After consideration of the diagnostic results and the patients response to treatment, I feel that patient will require admission for sepsis due to cellulitis of the neck. Discussed with patient and family at bedside who are understanding and in agreement with this.   Discussed patient with hospitalist who accepts patient for admission.    I discussed this case with my attending physician Dr. Karene Fry who cosigned this note including patient's presenting symptoms, physical exam, and planned diagnostics and interventions. Attending physician stated agreement with plan or made changes to plan which were implemented.    Final Clinical Impression(s) / ED Diagnoses Final diagnoses:  Sepsis without acute organ dysfunction, due to unspecified organism [facility])  Cellulitis of neck    Rx / DC Orders ED Discharge Orders     None         Vear Clock 05/30/23 1909    Ernie Avena, MD 05/31/23 1245

## 2023-05-30 NOTE — ED Triage Notes (Signed)
C/o abscess on posterior neck. Hx of same. States symptoms started 48 hr ago. Denies any respiratory issues. Redness and swellings extending to anterior neck.

## 2023-05-30 NOTE — Progress Notes (Signed)
Have affirmed with bedside RN that the blood cultures were drawn before antibiotic given, the times are result of discrepancy in data entry with the scanners.

## 2023-05-30 NOTE — Progress Notes (Incomplete)
CC: Right neck cellulitis  HPI:  Martin [name] is a 57 y.o. male who was admitted from the [facility] emergency room for treatment of his right neck cellulitis.  According to the patient, his  infection started with a pimple on the back of the right neck.  He first noted the swelling and pain 2 days ago.  It has progressively worsened.  The patient has not noted any purulent drainage.  The patient had an episode of right neck abscess in May 2024.  He underwent incision and drainage of the abscess by Dr. Suzanna Obey.  He was also treated with doxycycline and vancomycin.  His current infection is separate from his previous neck abscess.  Past Medical History:  Diagnosis Date  . Arthritis   . Chest pain   . Diabetes mellitus   . Gout   . History of kidney stones   . Hyperlipidemia   . Hypertension   . Obesity     Past Surgical History:  Procedure Laterality Date  . AMPUTATION TOE Right 01/23/2021   Procedure: AMPUTATION OF HALLUX;  Surgeon: Vivi Barrack, DPM;  Location: [facility] ORS;  Service: Podiatry;  Laterality: Right;  . AMPUTATION TOE Left 07/29/2022   Procedure: AMPUTATION TOE FIRST;  Surgeon: Vivi Barrack, DPM;  Location: [facility] ORS;  Service: Podiatry;  Laterality: Left;  . UMBILICAL HERNIA REPAIR      Family History  Problem Relation Age of Onset  . Diabetes Maternal Grandfather   . Kidney disease Other     Social History:  reports that he has never smoked. His smokeless tobacco use includes chew. He reports current alcohol use. He reports that he does not use drugs.  Allergies: No Known Allergies  Prior to Admission medications   Medication Sig Start Date End Date Taking? Authorizing Provider  atorvastatin (LIPITOR) 80 MG tablet Take 1 tablet (80 mg total) by mouth at bedtime. 11/23/22  Yes   empagliflozin (JARDIANCE) 25 MG TABS tablet Take 1 tablet (25 mg total) by mouth daily. 03/29/22  Yes   gabapentin (NEURONTIN) 300 MG capsule Take 1 capsule (300 mg  total) by mouth 3 (three) times daily. 10/20/22  Yes Rolly Salter, MD  insulin degludec (TRESIBA FLEXTOUCH) 200 UNIT/ML FlexTouch Pen Inject 110 Units into the skin daily. 10/20/22  Yes Rolly Salter, MD  lisinopril (ZESTRIL) 10 MG tablet Take 1 tablet (10 mg total) by mouth daily. 11/23/22  Yes   meloxicam (MOBIC) 15 MG tablet Take 1 tablet (15 mg total) by mouth daily. 06/27/22  Yes   Menthol-Methyl Salicylate (MUSCLE RUB) 10-15 % CREA Apply 1 Application topically as needed for muscle pain. 10/20/22  Yes Rolly Salter, MD  metFORMIN (GLUCOPHAGE-XR) 500 MG 24 hr tablet Take 2 tablets (1,000 mg total) by mouth 2 (two) times daily with a meal. 11/23/22  Yes   Continuous Blood Gluc Sensor (DEXCOM G7 SENSOR) MISC Change 1 sensor every 10 days on insulin 3 times daily 01/13/22     Continuous Glucose Receiver (FREESTYLE LIBRE 3 READER) DEVI Use 4 (four) times daily. 11/23/22     Continuous Glucose Sensor (FREESTYLE LIBRE 3 SENSOR) MISC Change sensor every 14 days. 11/23/22     glucose blood (FREESTYLE LITE) test strip Use three times daily as directed 03/09/21     Insulin Pen Needle 32G X 6 MM MISC Use to inject insulin 2 times a day for 90 days. 12/24/21   Talmage Coin, MD  Lancets (FREESTYLE) lancets Use three  times daily as directed 03/09/21   Talmage Coin, MD  sildenafil (VIAGRA) 100 MG tablet Take 1 tablet (100 mg total) by mouth up to once daily as needed Patient not taking: Reported on 05/30/2023 12/11/20     tadalafil (CIALIS) 20 MG tablet Take 1 tablet (20 mg total) by mouth one hour before sex as needed for E.D. Patient not taking: Reported on 05/30/2023 03/07/23       Blood pressure (!) 158/98, pulse 98, temperature 97.8 F (36.6 C), temperature source Oral, resp. rate 16, height 5\' 11"  (1.803 m), weight 109.8 kg, SpO2 96%. Exam: General: Communicates without difficulty, well nourished, no acute distress. Head: Normocephalic, no evidence injury, no tenderness, facial buttresses intact without  stepoff. Face/sinus: No tenderness to palpation and percussion. Facial movement is normal and symmetric. Eyes: PERRL, EOMI. No scleral icterus, conjunctivae clear. Neuro: CN II exam reveals vision grossly intact.  No nystagmus at any point of gaze. Ears: Auricles well formed without lesions.  Ear canals are intact without mass or lesion.  No erythema or edema is appreciated.  The TMs are intact without fluid. Nose: External evaluation reveals normal support and skin without lesions.  Dorsum is intact.  Anterior rhinoscopy reveals congested mucosa over anterior aspect of inferior turbinates and intact septum.  No purulence noted. Oral:  Oral cavity and oropharynx are intact, symmetric, without erythema or edema.  Mucosa is moist without lesions. Neck: Full range of motion without pain.  There is no significant lymphadenopathy.  No masses palpable.  Thyroid bed within normal limits to palpation.  Parotid glands and submandibular glands equal bilaterally without mass.  Trachea is midline. Neuro:  CN 2-12 grossly intact.   Assessment:   Plan:   [name] 05/30/2023, 11:57 PM

## 2023-05-30 NOTE — Progress Notes (Signed)
Elink following for sepsis protocol. 

## 2023-05-30 NOTE — Progress Notes (Addendum)
Patient arrived via transport from [facility]; alert and oriented x4 for assessment and treatment of possible sepsis/neck abcess  CHG bath and skin assessment completed. Patient's skin is intact. Redness and swelling noted in patient's posterior and right-sided neck. Patient's vitals stable. Patient oriented to unit and room to include call light and phone.  Patient refusing cardiac monitor.  Attending MD paged and made aware.

## 2023-05-31 DIAGNOSIS — Z791 Long term (current) use of non-steroidal anti-inflammatories (NSAID): Secondary | ICD-10-CM | POA: Diagnosis not present

## 2023-05-31 DIAGNOSIS — T63301A Toxic effect of unspecified spider venom, accidental (unintentional), initial encounter: Secondary | ICD-10-CM | POA: Diagnosis not present

## 2023-05-31 DIAGNOSIS — E1169 Type 2 diabetes mellitus with other specified complication: Secondary | ICD-10-CM | POA: Diagnosis not present

## 2023-05-31 DIAGNOSIS — Z833 Family history of diabetes mellitus: Secondary | ICD-10-CM | POA: Diagnosis not present

## 2023-05-31 DIAGNOSIS — B9562 Methicillin resistant Staphylococcus aureus infection as the cause of diseases classified elsewhere: Secondary | ICD-10-CM | POA: Diagnosis not present

## 2023-05-31 DIAGNOSIS — Z794 Long term (current) use of insulin: Secondary | ICD-10-CM | POA: Diagnosis not present

## 2023-05-31 DIAGNOSIS — Z7985 Long-term (current) use of injectable non-insulin antidiabetic drugs: Secondary | ICD-10-CM | POA: Diagnosis not present

## 2023-05-31 DIAGNOSIS — Z87442 Personal history of urinary calculi: Secondary | ICD-10-CM | POA: Diagnosis not present

## 2023-05-31 DIAGNOSIS — Z6833 Body mass index (BMI) 33.0-33.9, adult: Secondary | ICD-10-CM | POA: Diagnosis not present

## 2023-05-31 DIAGNOSIS — E1165 Type 2 diabetes mellitus with hyperglycemia: Secondary | ICD-10-CM | POA: Diagnosis not present

## 2023-05-31 DIAGNOSIS — Z89421 Acquired absence of other right toe(s): Secondary | ICD-10-CM | POA: Diagnosis not present

## 2023-05-31 DIAGNOSIS — Z8614 Personal history of Methicillin resistant Staphylococcus aureus infection: Secondary | ICD-10-CM | POA: Diagnosis not present

## 2023-05-31 DIAGNOSIS — Z841 Family history of disorders of kidney and ureter: Secondary | ICD-10-CM | POA: Diagnosis not present

## 2023-05-31 DIAGNOSIS — Z79899 Other long term (current) drug therapy: Secondary | ICD-10-CM | POA: Diagnosis not present

## 2023-05-31 DIAGNOSIS — E785 Hyperlipidemia, unspecified: Secondary | ICD-10-CM | POA: Diagnosis not present

## 2023-05-31 DIAGNOSIS — L0211 Cutaneous abscess of neck: Secondary | ICD-10-CM | POA: Diagnosis not present

## 2023-05-31 DIAGNOSIS — Z7984 Long term (current) use of oral hypoglycemic drugs: Secondary | ICD-10-CM | POA: Diagnosis not present

## 2023-05-31 DIAGNOSIS — F17220 Nicotine dependence, chewing tobacco, uncomplicated: Secondary | ICD-10-CM | POA: Diagnosis present

## 2023-05-31 DIAGNOSIS — Z89422 Acquired absence of other left toe(s): Secondary | ICD-10-CM | POA: Diagnosis not present

## 2023-05-31 DIAGNOSIS — I152 Hypertension secondary to endocrine disorders: Secondary | ICD-10-CM | POA: Diagnosis not present

## 2023-05-31 DIAGNOSIS — E669 Obesity, unspecified: Secondary | ICD-10-CM | POA: Diagnosis not present

## 2023-05-31 DIAGNOSIS — L03221 Cellulitis of neck: Secondary | ICD-10-CM | POA: Diagnosis not present

## 2023-05-31 LAB — GLUCOSE, CAPILLARY
Glucose-Capillary: 261 mg/dL — ABNORMAL HIGH (ref 70–99)
Glucose-Capillary: 234 mg/dL — ABNORMAL HIGH (ref 70–99)
Glucose-Capillary: 189 mg/dL — ABNORMAL HIGH (ref 70–99)
Glucose-Capillary: 206 mg/dL — ABNORMAL HIGH (ref 70–99)
Glucose-Capillary: 271 mg/dL — ABNORMAL HIGH (ref 70–99)

## 2023-05-31 LAB — CBC
HCT: 39.1 % (ref 39.0–52.0)
Hemoglobin: 13.1 g/dL (ref 13.0–17.0)
MCH: 28.3 pg (ref 26.0–34.0)
MCHC: 33.5 g/dL (ref 30.0–36.0)
MCV: 84.4 fL (ref 80.0–100.0)
Platelets: 246 10*3/uL (ref 150–400)
RBC: 4.63 MIL/uL (ref 4.22–5.81)
RDW: 12.3 % (ref 11.5–15.5)
WBC: 19.6 10*3/uL — ABNORMAL HIGH (ref 4.0–10.5)
nRBC: 0 % (ref 0.0–0.2)

## 2023-05-31 LAB — BASIC METABOLIC PANEL
Anion gap: 9 (ref 5–15)
BUN: 6 mg/dL (ref 6–20)
CO2: 22 mmol/L (ref 22–32)
Calcium: 8.7 mg/dL — ABNORMAL LOW (ref 8.9–10.3)
Chloride: 100 mmol/L (ref 98–111)
Creatinine, Ser: 0.75 mg/dL (ref 0.61–1.24)
GFR, Estimated: >60 mL/min (ref >60)
Glucose, Bld: 322 mg/dL — ABNORMAL HIGH (ref 70–99)
Potassium: 3.8 mmol/L (ref 3.5–5.1)
Sodium: 131 mmol/L — ABNORMAL LOW (ref 135–145)

## 2023-05-31 MED ORDER — ENOXAPARIN SODIUM 60 MG/0.6ML IJ SOSY
50.0000 mg | PREFILLED_SYRINGE | INTRAMUSCULAR | Status: DC
  Administered 2023-05-31 – 2023-06-02 (×3): 50 mg via SUBCUTANEOUS
  Filled 2023-05-31 (×3): qty 0.6

## 2023-05-31 NOTE — Plan of Care (Signed)
  Problem: Education: Goal: Knowledge of General Education information will improve Description: Including pain rating scale, medication(s)/side effects and non-pharmacologic comfort measures Outcome: Progressing   Problem: Clinical Measurements: Goal: Ability to maintain clinical measurements within normal limits will improve Outcome: Progressing Goal: Diagnostic test results will improve Outcome: Progressing   

## 2023-05-31 NOTE — Progress Notes (Signed)
  Progress Note   Patient: Martin [name] ZOX:[id number] DOB: 1966/05/11 DOA: 05/30/2023     0 DOS: the patient was seen and examined on 05/31/2023 at 9:22 and 3:20PM      Brief hospital course: 57 y.o. M with obesity, DM, HTN, HLD and hx MRSA skin infection of neck who is admitted with cellulitis of the right posterior neck.     Assessment and Plan:  Cellulitis of the neck Recent "spider bite", likely MRSA.  Slightly better but white count still 19, still a lot of pain and redness and swelling.  No fluid collection to drain. - Continue vancomycin, Unasyn   Obesity BMI 33  Hypertension - Continue lisinopril  Diabetes Blood sugar controlled - Hold metformin - Continue glargine, sliding scale corrections - Hold empagliflozin, gabapentin  Hyperlipidemia - Continue Lipitor  Hyponatremia Mild, asymptomatic - Hold further fluids          Subjective: Patient is gradually better, but still has a lot of redness and pain     Physical Exam: BP 138/79 (BP Location: Left Arm)   Pulse 88   Temp 98.8 F (37.1 C) (Oral)   Resp 16   Ht 5\' 11"  (1.803 m)   Wt 109.8 kg   SpO2 95%   BMI 33.75 kg/m   Obese adult male, sitting up in recliner, interactive and appropriate RRR, no murmurs, no peripheral edema Respiratory rate normal, lungs clear without rales or wheezes Abdomen soft without tenderness palpation The right neck has a small carbuncle, draining some purulent material, and there is a large amount of surrounding redness and subcutaneous edema and swelling, extending around the front of the neck, without affecting breathing or phonation He has numerous excoriated marks on his arms    Data Reviewed: Basic metabolic panel shows sodium 131 CBC shows white count 19K  Family Communication:-Wife at the bedside    Disposition: Status is: Inpatient The patient was admitted with cellulitis of the neck.  He has a significant elevated white count,  significant swelling and redness still.  This is a MRSA infection of this head and neck, and I recommend continued IV antibiotics        Author: Alberteen Sam, MD 05/31/2023 3:43 PM  For on call review [email or web address].

## 2023-06-01 ENCOUNTER — Other Ambulatory Visit (HOSPITAL_COMMUNITY): Payer: Self-pay

## 2023-06-01 DIAGNOSIS — E871 Hypo-osmolality and hyponatremia: Secondary | ICD-10-CM | POA: Insufficient documentation

## 2023-06-01 DIAGNOSIS — L03221 Cellulitis of neck: Secondary | ICD-10-CM | POA: Diagnosis not present

## 2023-06-01 LAB — GLUCOSE, CAPILLARY
Glucose-Capillary: 303 mg/dL — ABNORMAL HIGH (ref 70–99)
Glucose-Capillary: 227 mg/dL — ABNORMAL HIGH (ref 70–99)
Glucose-Capillary: 200 mg/dL — ABNORMAL HIGH (ref 70–99)
Glucose-Capillary: 135 mg/dL — ABNORMAL HIGH (ref 70–99)
Glucose-Capillary: 196 mg/dL — ABNORMAL HIGH (ref 70–99)

## 2023-06-01 LAB — CBC
HCT: 39.6 % (ref 39.0–52.0)
Hemoglobin: 13.2 g/dL (ref 13.0–17.0)
MCH: 28.1 pg (ref 26.0–34.0)
MCHC: 33.3 g/dL (ref 30.0–36.0)
MCV: 84.4 fL (ref 80.0–100.0)
Platelets: 247 10*3/uL (ref 150–400)
RBC: 4.69 MIL/uL (ref 4.22–5.81)
RDW: 12.3 % (ref 11.5–15.5)
WBC: 17 10*3/uL — ABNORMAL HIGH (ref 4.0–10.5)
nRBC: 0 % (ref 0.0–0.2)

## 2023-06-01 LAB — MRSA NEXT GEN BY PCR, NASAL: MRSA by PCR Next Gen: DETECTED — AB

## 2023-06-01 MED ORDER — AMOXICILLIN-POT CLAVULANATE 875-125 MG PO TABS
1.0000 | ORAL_TABLET | Freq: Two times a day (BID) | ORAL | Status: DC
  Administered 2023-06-01: 1 via ORAL
  Filled 2023-06-01: qty 1

## 2023-06-01 MED ORDER — CHLORHEXIDINE GLUCONATE CLOTH 2 % EX PADS
6.0000 | MEDICATED_PAD | Freq: Every day | CUTANEOUS | Status: DC
  Administered 2023-06-02 – 2023-06-03 (×2): 6 via TOPICAL

## 2023-06-01 MED ORDER — INSULIN PEN NEEDLE 32G X 6 MM MISC
Freq: Two times a day (BID) | 0 refills | Status: DC
  Filled 2023-06-01: qty 200, 90d supply, fill #0

## 2023-06-01 MED ORDER — EMPAGLIFLOZIN 25 MG PO TABS
25.0000 mg | ORAL_TABLET | Freq: Every day | ORAL | Status: DC
  Administered 2023-06-01 – 2023-06-03 (×3): 25 mg via ORAL
  Filled 2023-06-01 (×3): qty 1

## 2023-06-01 MED ORDER — INSULIN GLARGINE-YFGN 100 UNIT/ML ~~LOC~~ SOLN
40.0000 [IU] | Freq: Two times a day (BID) | SUBCUTANEOUS | Status: DC
  Administered 2023-06-01 – 2023-06-03 (×5): 40 [IU] via SUBCUTANEOUS
  Filled 2023-06-01 (×6): qty 0.4

## 2023-06-01 MED ORDER — MUPIROCIN 2 % EX OINT
1.0000 | TOPICAL_OINTMENT | Freq: Two times a day (BID) | CUTANEOUS | Status: DC
  Administered 2023-06-01 – 2023-06-03 (×4): 1 via NASAL
  Filled 2023-06-01 (×2): qty 22

## 2023-06-01 NOTE — Assessment & Plan Note (Signed)
WBC down slightly to 17K but still quite red, indurated and draining.  Agree with ENT, nothing to open up at this point. - Continue vancomycin, Unasyn

## 2023-06-01 NOTE — Progress Notes (Signed)
  Progress Note   Patient: Martin [name] ZOX:[id number] DOB: February 11, 1966 DOA: 05/30/2023     1 DOS: the patient was seen and examined on 06/01/2023        Brief hospital course: 57 y.o. M with obesity, DM, HTN, HLD and hx MRSA skin infection of neck who is admitted with cellulitis of the right posterior neck.     Assessment and Plan: * Cellulitis of neck WBC down slightly to 17K but still quite red, indurated and draining.  Agree with ENT, nothing to open up at this point. - Continue vancomycin, Unasyn    Insulin dependent type 2 diabetes mellitus (HCC) Glucoses elevated - Increase glargine - Resume Jardiance - Continue SS corrections - Hold metformin  Hypertension associated with diabetes (HCC) BP high normal - Hold lisinopril today - Resume Jardiance  Hyponatremia Mild, asymptomatic, holding fluids  Hyperlipidemia associated with type 2 diabetes mellitus (HCC) - Continue Lipitor          Subjective: Patient is feeling a lot better, still has draining and pain and induration and redness in the back of the neck     Physical Exam: BP 136/74 (BP Location: Left Arm)   Pulse 98   Temp 98.5 F (36.9 C) (Oral)   Resp 18   Ht 5\' 11"  (1.803 m)   Wt 109.8 kg   SpO2 98%   BMI 33.75 kg/m   Adult male, sitting up in bed, interactive and appropriate RRR, no murmurs, no peripheral edema Respiratory rate normal, lungs clear without rales or wheezes There is a 4 x 4 centimeter very red and indurated area of the back of the neck, extending around the side, this has a whitehead at the middle that is draining purulence  Data Reviewed: CBC shows white count down to 14 Discussed with ENT  Family Communication:     Disposition: Status is: Inpatient Given location, high sugars and white count, I think he should have continued IV antibiotics today, hopefully home in the next 1-2 days        Author: Alberteen Sam, MD 06/01/2023 2:53 PM  For on  call review [email or web address].

## 2023-06-01 NOTE — Progress Notes (Signed)
Subjective: No new issues ovenight. Right neck is still indurated. Started draining this morning.  Objective: Vital signs in last 24 hours: Temp:  [98.1 F (36.7 C)-98.6 F (37 C)] 98.4 F (36.9 C) (12/26 0806) Pulse Rate:  [76-98] 78 (12/26 0806) Resp:  [16-18] 18 (12/26 0806) BP: (141-153)/(73-91) 153/91 (12/26 0806) SpO2:  [93 %-99 %] 93 % (12/26 0806)  Physical Exam Eyes: Pupils are equal, round, reactive to light. Extraocular motion is intact.  Ears: Examination of the ears shows normal auricles and external auditory canals bilaterally.  Nose: Nasal examination shows normal mucosa, septum, turbinates.  Face: Facial examination shows no asymmetry. Palpation of the face elicit no significant tenderness.  Mouth: Oral cavity examination shows no mucosal abnormalities. No significant trismus is noted.  Neck: Diffuse induration noted on the right posterior neck.  No fluctuance is noted. Slight drainage noted.  The trachea is midline. The thyroid is not significantly enlarged.  Neuro: Cranial nerves 2-12 are all grossly in tact.   Recent Labs    05/31/23 0256 06/01/23 0246  WBC 19.6* 17.0*  HGB 13.1 13.2  HCT 39.1 39.6  PLT 246 247   Recent Labs    05/30/23 1448 05/31/23 0256  NA 130* 131*  K 4.1 3.8  CL 97* 100  CO2 25 22  GLUCOSE 385* 322*  BUN 10 6  CREATININE 0.77 0.75  CALCIUM 9.1 8.7*    Medications: I have reviewed the patient's current medications. Scheduled:  atorvastatin  80 mg Oral QHS   enoxaparin (LOVENOX) injection  50 mg Subcutaneous Q24H   famotidine  20 mg Oral Daily   insulin aspart  0-15 Units Subcutaneous TID WC   insulin aspart  0-5 Units Subcutaneous QHS   insulin glargine-yfgn  40 Units Subcutaneous BID   lisinopril  10 mg Oral Daily   naproxen  500 mg Oral BID WC   Continuous:  ampicillin-sulbactam (UNASYN) IV 3 g (06/01/23 0828)   vancomycin 1,250 mg (06/01/23 0348)   ZOX:[id number] **OR** acetaminophen, ondansetron **OR**  ondansetron (ZOFRAN) IV, oxyCODONE, senna-docusate  Assessment/Plan: Acute right posterior neck cellulitis. The skin erythema has decreased. Still indurated but no fluctuance. - WBC has decreased. - Continue IV abx (Unasyn/vancomycin). - May convert to oral antibiotic if he clinically improves.    LOS: 1 day   Martin [name] 06/01/2023, 12:26 PM

## 2023-06-01 NOTE — Assessment & Plan Note (Addendum)
BP high normal - Hold lisinopril today - Resume Jardiance

## 2023-06-01 NOTE — Assessment & Plan Note (Signed)
Mild, asymptomatic, holding fluids

## 2023-06-01 NOTE — Assessment & Plan Note (Signed)
-  Continue Lipitor °

## 2023-06-01 NOTE — Assessment & Plan Note (Signed)
Glucoses elevated - Increase glargine - Resume Jardiance - Continue SS corrections - Hold metformin

## 2023-06-01 NOTE — Plan of Care (Signed)

## 2023-06-02 DIAGNOSIS — A419 Sepsis, unspecified organism: Secondary | ICD-10-CM

## 2023-06-02 DIAGNOSIS — L0211 Cutaneous abscess of neck: Secondary | ICD-10-CM

## 2023-06-02 DIAGNOSIS — L03221 Cellulitis of neck: Secondary | ICD-10-CM | POA: Diagnosis not present

## 2023-06-02 LAB — BASIC METABOLIC PANEL
Anion gap: 12 (ref 5–15)
BUN: 10 mg/dL (ref 6–20)
CO2: 23 mmol/L (ref 22–32)
Calcium: 9.3 mg/dL (ref 8.9–10.3)
Chloride: 101 mmol/L (ref 98–111)
Creatinine, Ser: 0.67 mg/dL (ref 0.61–1.24)
GFR, Estimated: >60 mL/min (ref >60)
Glucose, Bld: 109 mg/dL — ABNORMAL HIGH (ref 70–99)
Potassium: 3.6 mmol/L (ref 3.5–5.1)
Sodium: 136 mmol/L (ref 135–145)

## 2023-06-02 LAB — GLUCOSE, CAPILLARY
Glucose-Capillary: 123 mg/dL — ABNORMAL HIGH (ref 70–99)
Glucose-Capillary: 111 mg/dL — ABNORMAL HIGH (ref 70–99)
Glucose-Capillary: 128 mg/dL — ABNORMAL HIGH (ref 70–99)
Glucose-Capillary: 128 mg/dL — ABNORMAL HIGH (ref 70–99)
Glucose-Capillary: 183 mg/dL — ABNORMAL HIGH (ref 70–99)

## 2023-06-02 LAB — CBC
HCT: 39.1 % (ref 39.0–52.0)
Hemoglobin: 13.2 g/dL (ref 13.0–17.0)
MCH: 28.4 pg (ref 26.0–34.0)
MCHC: 33.8 g/dL (ref 30.0–36.0)
MCV: 84.1 fL (ref 80.0–100.0)
Platelets: 281 10*3/uL (ref 150–400)
RBC: 4.65 MIL/uL (ref 4.22–5.81)
RDW: 12.2 % (ref 11.5–15.5)
WBC: 15.2 10*3/uL — ABNORMAL HIGH (ref 4.0–10.5)
nRBC: 0 % (ref 0.0–0.2)

## 2023-06-02 LAB — VANCOMYCIN, PEAK: Vancomycin Pk: 24 ug/mL — ABNORMAL LOW (ref 30–40)

## 2023-06-02 LAB — HEMOGLOBIN A1C
Hgb A1c MFr Bld: 11.6 % — ABNORMAL HIGH (ref 4.8–5.6)
Mean Plasma Glucose: 286 mg/dL

## 2023-06-02 MED ORDER — BISACODYL 5 MG PO TBEC
5.0000 mg | DELAYED_RELEASE_TABLET | Freq: Every day | ORAL | Status: DC | PRN
  Administered 2023-06-02 – 2023-06-03 (×2): 5 mg via ORAL
  Filled 2023-06-02 (×2): qty 1

## 2023-06-02 MED ORDER — SENNOSIDES-DOCUSATE SODIUM 8.6-50 MG PO TABS
1.0000 | ORAL_TABLET | Freq: Two times a day (BID) | ORAL | Status: DC | PRN
  Administered 2023-06-02: 1 via ORAL
  Filled 2023-06-02: qty 1

## 2023-06-02 MED ORDER — CLINDAMYCIN HCL 300 MG PO CAPS
300.0000 mg | ORAL_CAPSULE | Freq: Four times a day (QID) | ORAL | Status: DC
  Administered 2023-06-02 – 2023-06-03 (×4): 300 mg via ORAL
  Filled 2023-06-02 (×6): qty 1

## 2023-06-02 MED ORDER — LIVING WELL WITH DIABETES BOOK
Freq: Once | Status: AC
  Filled 2023-06-02: qty 1

## 2023-06-02 NOTE — Progress Notes (Signed)
Pharmacy Antibiotic Note  Martin [name] is a 57 y.o. male admitted on 05/30/2023 with neck cellulitis with abscess. Pharmacy has been consulted for Vancomycin dosing.   Patient continues on vancomycin. His neck cellulitis remains significantly indurated and there is now possible abscess formation. Per both ENT and primary MD, would like for patient to remain on vancomycin. Noted I&D planned for this afternoon, 12/27. Was switched to Augmentin by MD due to increased edema with Unasyn, then switched to clindamycin PO.   Plan: Continue vancomycin 1250 mg IV q12h (eAUC: 470.2, Scr 0.77) Obtain vancomycin peak and trough surrounding 12/27 1600 dose and adjust dose as indicated by patient specific kinetics Monitor clinical response, renal function, and de-escalation Repeat vancomycin levels as indicated  Height: 5\' 11"  (180.3 cm) Weight: 109.8 kg (242 lb) IBW/kg (Calculated) : 75.3  Temp (24hrs), Avg:98.5 F (36.9 C), Min:97.6 F (36.4 C), Max:98.8 F (37.1 C)  Recent Labs  Lab 05/30/23 1448 05/30/23 1530 05/30/23 1915 05/31/23 0256 06/01/23 0246  WBC 17.8*  --   --  19.6* 17.0*  CREATININE 0.77  --   --  0.75  --   LATICACIDVEN  --  2.0* 2.1*  --   --     Estimated Creatinine Clearance: 126.8 mL/min (by C-G formula based on SCr of 0.75 mg/dL).    No Known Allergies  Antimicrobials this admission: Vanc 12/24 >>  Unasyn 12/24 >> 12/27 Augmentin 12/26  Clindamycin 12/26 >>  Dose adjustments this admission: N/a  Microbiology results: 05/30/23 BCx: NGTD  Thank you for allowing pharmacy to be a part of this patient's care.  Lennie Muckle, PharmD PGY1 Pharmacy Resident 06/02/2023 9:23 AM

## 2023-06-02 NOTE — Progress Notes (Signed)
Explained the procedure to the patient by the Dr, consent signed, vital signs taken.   06/02/23 1353  Vitals  Temp 97.6 F (36.4 C)  Temp Source Oral  BP (!) 159/80  MAP (mmHg) 103  BP Location Left Arm  BP Method Automatic  Patient Position (if appropriate) Lying  Pulse Rate 78  Pulse Rate Source Monitor  Resp 18  MEWS COLOR  MEWS Score Color Green  Oxygen Therapy  SpO2 100 %  O2 Device Room Air  MEWS Score  MEWS Temp 0  MEWS Systolic 0  MEWS Pulse 0  MEWS RR 0  MEWS LOC 0  MEWS Score 0

## 2023-06-02 NOTE — Progress Notes (Signed)
Procedure: Incision and drainage of the left posterior neck abscess  Anesthesia: local anesthesia with 1% lidocaine with epinephrine.    Description: The patient is placed upright on the hospital bed.  The patient is prepped and draped in a sterile fashion for incision and drainage of his neck abscess.  1% lidocaine with epinephrine is injected at the planned site of incision.  After adequate local anesthesia is achieved, an 15 blade is used to make a vertical incision over his large right posterior neck abscess.  The incision is carried down past the subcutaneous tissue.  An abscess pocket is noted deep to the subcutaneous tissue.  Purulent drainage is expressed.  Aerobic and anaerobic cultures are taken.  Blunt dissection is then carried out to connect several small abscess pockets.  The abscess cavity is then packed with a sterile gauze.  The patient tolerated the procedure well.     Disposition: Continue his clindamycin and vancomycin.  The packing will likely be removed tomorrow.

## 2023-06-02 NOTE — Plan of Care (Signed)
  Problem: Clinical Measurements: Goal: Ability to maintain clinical measurements within normal limits will improve Outcome: Progressing   Problem: Clinical Measurements: Goal: Will remain free from infection Outcome: Progressing   Problem: Clinical Measurements: Goal: Diagnostic test results will improve Outcome: Progressing   Problem: Clinical Measurements: Goal: Respiratory complications will improve Outcome: Progressing   Problem: Coping: Goal: Level of anxiety will decrease Outcome: Progressing   Problem: Elimination: Goal: Will not experience complications related to bowel motility Outcome: Progressing Goal: Will not experience complications related to urinary retention Outcome: Progressing

## 2023-06-02 NOTE — Inpatient Diabetes Management (Addendum)
Inpatient Diabetes Program Recommendations  AACE/ADA: New Consensus Statement on Inpatient Glycemic Control (2015)  Target Ranges:  Prepandial:   less than 140 mg/dL      Peak postprandial:   less than 180 mg/dL (1-2 hours)      Critically ill patients:  140 - 180 mg/dL   Lab Results  Component Value Date   GLUCAP 111 (H) 06/02/2023   HGBA1C 11.6 (H) 05/31/2023    Review of Glycemic Control  Latest Reference Range & Units 06/01/23 08:53 06/01/23 12:10 06/01/23 17:56 06/01/23 21:32 06/02/23 06:18 06/02/23 08:08  Glucose-Capillary 70 - 99 mg/dL 960 (H) 454 (H) 098 (H) 196 (H) 123 (H) 111 (H)  (H): Data is abnormally high  Diabetes history: DM2 Outpatient Diabetes medications: Tresiba 120 units every day, Metformin 2000 mg every day, Jardiance 25 mg QD Current orders for Inpatient glycemic control: Semglee 40 units BID, Novolog 0-15 units TID and 0-5 units at bedtime, Jardiance 25 mg QD  Met with patient at bedside.  Reviewed patient's current A1c of 11.6%. Explained what a A1c is and what it measures. Also reviewed goal A1c with patient, importance of good glucose control @ home, and blood sugar goals.  He is in disbelief that this is an accurate A1C.  He states it is probably more like 5%.  He is Current with Endocrinologist, Dr. Sharl Ma.    He has had several stressors over the last few weeks including his sister in law passing away.  He checks his glucose and administers his insulin as prescribed.  Denies difficulty obtaining DM medications.    Educated on The Plate Method, CHO's, portion control, CBGs at home fasting and mid afternoon, F/U with PCP every 3 months, bring meter to PCP office, long and short term complications of uncontrolled BG, and importance of exercise.  He states he gets plenty of exercise with his [employer] job.    Encouraged him to follow up with Dr. Sharl Ma and maybe discuss starting a GLP-1 RA to assist with glucose control.  He verbalizes understanding.   He denies  episodes of hypoglycemia; he is aware of sign, symptoms and treatments.  Ordered the LWWD booklet.  Will continue to follow while inpatient.  Thank you, Dulce Sellar, MSN, CDCES Diabetes Coordinator Inpatient Diabetes Program [phone] (team pager from 8a-5p)

## 2023-06-02 NOTE — Progress Notes (Signed)
  Progress Note   Patient: Martin [name] QVZ:[id number] DOB: 04-30-1966 DOA: 05/30/2023     2 DOS: the patient was seen and examined on 06/02/2023 at 8:58 AM      Brief hospital course: 57 y.o. M with obesity, DM, HTN, HLD and hx MRSA skin infection of neck who is admitted with cellulitis of the right posterior neck.     Assessment and Plan: * Cellulitis of neck ENT plan to lance the abscess this afternoon White count down to 15K - Continue vancomycin - Changed to clindamycin for anaerobic coverage    Insulin dependent type 2 diabetes mellitus (HCC) A1c 11.6%. Glucoses much better - Continue glargine - Continue Jardiance - Continue SS corrections - Hold metformin  Hypertension associated with diabetes (HCC) BP elevated Continue lisinopril, continue Jardiance  Hyponatremia Mild, asymptomatic, holding fluids  Hyperlipidemia associated with type 2 diabetes mellitus (HCC) - Continue Lipitor  Obesity BMI 33.7         Subjective: No new concerns.  Still a hard firm spot on the back of the neck.     Physical Exam: BP (!) 155/97 (BP Location: Left Arm)   Pulse 77   Temp 97.6 F (36.4 C) (Oral)   Resp 19   Ht 5\' 11"  (1.803 m)   Wt 109.8 kg   SpO2 100%   BMI 33.75 kg/m   Adult male, sitting up in bed, interactive and appropriate RRR, no murmurs, no peripheral edema Respiratory rate normal, lungs clear without rales or wheezes Abdomen soft no tenderness palpation or guarding There is still a fairly well-circumscribed indurated red subcutaneous lump on the back of the patient's neck with overlying purulence and some drainage.  Data Reviewed: Discussed with ENT Creatinine stable CBC shows white count down to 15     Disposition: Status is: Inpatient         Author: Alberteen Sam, MD 06/02/2023 4:03 PM  For on call review [email or web address].

## 2023-06-02 NOTE — Progress Notes (Signed)
Subjective: No issues overnight.  The neck pain has decreased.  Objective: Vital signs in last 24 hours: Temp:  [97.6 F (36.4 C)-98.8 F (37.1 C)] 97.6 F (36.4 C) (12/27 0813) Pulse Rate:  [74-98] 75 (12/27 0813) Resp:  [17-18] 18 (12/27 0813) BP: (136-169)/(74-95) 169/95 (12/27 0813) SpO2:  [94 %-99 %] 99 % (12/27 0813)  Physical Exam Eyes: Pupils are equal, round, reactive to light. Extraocular motion is intact.  Ears: Examination of the ears shows normal auricles and external auditory canals bilaterally.  Nose: Nasal examination shows normal mucosa, septum, turbinates.  Face: Facial examination shows no asymmetry. Palpation of the face elicit no significant tenderness.  Mouth: Oral cavity examination shows no mucosal abnormalities. No significant trismus is noted.  Neck: Diffuse induration noted on the right posterior neck.  An area of softening is noted centrally.  Slight drainage noted.  The trachea is midline. The thyroid is not significantly enlarged.  Neuro: Cranial nerves 2-12 are all grossly intact.   Recent Labs    05/31/23 0256 06/01/23 0246  WBC 19.6* 17.0*  HGB 13.1 13.2  HCT 39.1 39.6  PLT 246 247   Recent Labs    05/30/23 1448 05/31/23 0256  NA 130* 131*  K 4.1 3.8  CL 97* 100  CO2 25 22  GLUCOSE 385* 322*  BUN 10 6  CREATININE 0.77 0.75  CALCIUM 9.1 8.7*    Medications: I have reviewed the patient's current medications. Scheduled:  amoxicillin-clavulanate  1 tablet Oral Q12H   atorvastatin  80 mg Oral QHS   Chlorhexidine Gluconate Cloth  6 each Topical Q0600   empagliflozin  25 mg Oral Daily   enoxaparin (LOVENOX) injection  50 mg Subcutaneous Q24H   famotidine  20 mg Oral Daily   insulin aspart  0-15 Units Subcutaneous TID WC   insulin aspart  0-5 Units Subcutaneous QHS   insulin glargine-yfgn  40 Units Subcutaneous BID   lisinopril  10 mg Oral Daily   mupirocin ointment  1 Application Nasal BID   naproxen  500 mg Oral BID WC    Continuous:  vancomycin 1,250 mg (06/02/23 0423)   NWG:[id number] **OR** acetaminophen, ondansetron **OR** ondansetron (ZOFRAN) IV, oxyCODONE, senna-docusate  Assessment/Plan: Right posterior neck cellulitis, with possible abscess formation centrally.  His right neck is still significantly indurated. -Continue antibiotic treatment. -Plan to perform I&D this afternoon at bedside to drain any possible abscess.   LOS: 2 days   Dia Donate W Dvonte Gatliff 06/02/2023, 8:18 AM

## 2023-06-03 ENCOUNTER — Other Ambulatory Visit (HOSPITAL_COMMUNITY): Payer: Self-pay

## 2023-06-03 DIAGNOSIS — L03221 Cellulitis of neck: Secondary | ICD-10-CM | POA: Diagnosis not present

## 2023-06-03 LAB — BASIC METABOLIC PANEL
Anion gap: 10 (ref 5–15)
BUN: 10 mg/dL (ref 6–20)
CO2: 22 mmol/L (ref 22–32)
Calcium: 9.2 mg/dL (ref 8.9–10.3)
Chloride: 103 mmol/L (ref 98–111)
Creatinine, Ser: 0.68 mg/dL (ref 0.61–1.24)
GFR, Estimated: >60 mL/min (ref >60)
Glucose, Bld: 153 mg/dL — ABNORMAL HIGH (ref 70–99)
Potassium: 3.5 mmol/L (ref 3.5–5.1)
Sodium: 135 mmol/L (ref 135–145)

## 2023-06-03 LAB — VANCOMYCIN, TROUGH: Vancomycin Tr: 14 ug/mL — ABNORMAL LOW (ref 15–20)

## 2023-06-03 LAB — CBC
HCT: 41.7 % (ref 39.0–52.0)
Hemoglobin: 14 g/dL (ref 13.0–17.0)
MCH: 28.2 pg (ref 26.0–34.0)
MCHC: 33.6 g/dL (ref 30.0–36.0)
MCV: 83.9 fL (ref 80.0–100.0)
Platelets: 313 10*3/uL (ref 150–400)
RBC: 4.97 MIL/uL (ref 4.22–5.81)
RDW: 12.1 % (ref 11.5–15.5)
WBC: 12.9 10*3/uL — ABNORMAL HIGH (ref 4.0–10.5)
nRBC: 0 % (ref 0.0–0.2)

## 2023-06-03 LAB — GLUCOSE, CAPILLARY: Glucose-Capillary: 119 mg/dL — ABNORMAL HIGH (ref 70–99)

## 2023-06-03 MED ORDER — CLINDAMYCIN HCL 300 MG PO CAPS
300.0000 mg | ORAL_CAPSULE | Freq: Four times a day (QID) | ORAL | 0 refills | Status: AC
  Filled 2023-06-03: qty 40, 10d supply, fill #0

## 2023-06-03 MED ORDER — CLINDAMYCIN HCL 300 MG PO CAPS
300.0000 mg | ORAL_CAPSULE | Freq: Four times a day (QID) | ORAL | 0 refills | Status: DC
  Filled 2023-06-03: qty 40, 10d supply, fill #0

## 2023-06-03 MED ORDER — DOXYCYCLINE HYCLATE 100 MG PO TABS
100.0000 mg | ORAL_TABLET | Freq: Two times a day (BID) | ORAL | 0 refills | Status: AC
  Filled 2023-06-03: qty 20, 10d supply, fill #0

## 2023-06-03 MED ORDER — DOXYCYCLINE HYCLATE 100 MG PO TABS
100.0000 mg | ORAL_TABLET | Freq: Two times a day (BID) | ORAL | 0 refills | Status: DC
  Filled 2023-06-03: qty 20, 10d supply, fill #0

## 2023-06-03 NOTE — Progress Notes (Signed)
Pharmacy Antibiotic Note  DEACAN SCHARFF is a 57 y.o. male admitted on 05/30/2023 with neck cellulitis with abscess. Pharmacy has been consulted for Vancomycin dosing.   Patient continues on vancomycin. His neck cellulitis remains significantly indurated and there is now possible abscess formation. Per both ENT and primary MD, would like for patient to remain on vancomycin. Noted I&D planned for this afternoon, 12/27. Was switched to Augmentin by MD due to increased edema with Unasyn, then switched to clindamycin PO.   12/28 AM update:  Vancomycin AUC therapeutic at 480 WBC improving  Renal function stable  Plan: Cont vancomycin 1250 mg IV q12h Cont oral clindamycin  Repeat vancomycin level as needed  Height: 5\' 11"  (180.3 cm) Weight: 109.8 kg (242 lb) IBW/kg (Calculated) : 75.3  Temp (24hrs), Avg:97.8 F (36.6 C), Min:97.6 F (36.4 C), Max:98.2 F (36.8 C)  Recent Labs  Lab 05/30/23 1448 05/30/23 1530 05/30/23 1915 05/31/23 0256 06/01/23 0246 06/02/23 0835 06/02/23 1005 06/02/23 1931 06/03/23 0258  WBC 17.8*  --   --  19.6* 17.0*  --  15.2*  --  12.9*  CREATININE 0.77  --   --  0.75  --  0.67  --   --  0.68  LATICACIDVEN  --  2.0* 2.1*  --   --   --   --   --   --   VANCOTROUGH  --   --   --   --   --   --   --   --  14*  VANCOPEAK  --   --   --   --   --   --   --  24*  --     Estimated Creatinine Clearance: 126.8 mL/min (by C-G formula based on SCr of 0.68 mg/dL).    No Known Allergies  Antimicrobials this admission: Vanc 12/24 >>  Unasyn 12/24 >> 12/27 Augmentin 12/26  Clindamycin 12/26 >>   Microbiology results: 05/30/23 BCx: NGTD  Abran Duke, PharmD, BCPS Clinical Pharmacist Phone: [phone]

## 2023-06-03 NOTE — Progress Notes (Signed)
 Patient discharged from unit  medications and property returned to designated person. Discharge instructions reviewed and patient questions answered.

## 2023-06-03 NOTE — Progress Notes (Signed)
Subjective: No issues overnight.  The patient has been having significant drainage from the incision site.  Neck pain has improved.  Objective: Vital signs in last 24 hours: Temp:  [97.6 F (36.4 C)-98.6 F (37 C)] 98.6 F (37 C) (12/28 0808) Pulse Rate:  [67-78] 70 (12/28 0808) Resp:  [18-19] 19 (12/28 0808) BP: (136-163)/(73-97) 144/88 (12/28 0820) SpO2:  [96 %-100 %] 96 % (12/28 1610)  Physical Exam Eyes: Pupils are equal, round, reactive to light. Extraocular motion is intact.  Ears: Examination of the ears shows normal auricles and external auditory canals bilaterally.  Nose: Nasal examination shows normal mucosa, septum, turbinates.  Face: Facial examination shows no asymmetry. Palpation of the face elicit no significant tenderness.  Mouth: Oral cavity examination shows no mucosal abnormalities. No significant trismus is noted.  Neck: Diffuse induration noted on the right posterior neck.  I&D dressing is changed without difficulty.   Neuro: Cranial nerves 2-12 are all grossly intact.     Recent Labs    06/02/23 1005 06/03/23 0258  WBC 15.2* 12.9*  HGB 13.2 14.0  HCT 39.1 41.7  PLT 281 313   Recent Labs    06/02/23 0835 06/03/23 0258  NA 136 135  K 3.6 3.5  CL 101 103  CO2 23 22  GLUCOSE 109* 153*  BUN 10 10  CREATININE 0.67 0.68  CALCIUM 9.3 9.2    Medications: I have reviewed the patient's current medications. Scheduled:  atorvastatin  80 mg Oral QHS   Chlorhexidine Gluconate Cloth  6 each Topical Q0600   clindamycin  300 mg Oral Q6H   empagliflozin  25 mg Oral Daily   enoxaparin (LOVENOX) injection  50 mg Subcutaneous Q24H   famotidine  20 mg Oral Daily   insulin aspart  0-15 Units Subcutaneous TID WC   insulin aspart  0-5 Units Subcutaneous QHS   insulin glargine-yfgn  40 Units Subcutaneous BID   lisinopril  10 mg Oral Daily   mupirocin ointment  1 Application Nasal BID   naproxen  500 mg Oral BID WC   Continuous:  vancomycin 1,250 mg (06/03/23  0337)   RUE:[id number] **OR** acetaminophen, bisacodyl, ondansetron **OR** ondansetron (ZOFRAN) IV, oxyCODONE, senna-docusate  Assessment/Plan: Right posterior neck cellulitis/abscess. -1 day status post I&D of right neck abscess. -WBC has significantly decreased. -May DC home on oral clindamycin 300 mg 4 times daily for 10 additional days. -Dressing change as needed. -The patient is instructed to call my office on Monday to schedule an appointment in 2 weeks.   LOS: 3 days   Martin [name] 06/03/2023, 11:08 AM

## 2023-06-04 ENCOUNTER — Telehealth (INDEPENDENT_AMBULATORY_CARE_PROVIDER_SITE_OTHER): Payer: Self-pay | Admitting: Otolaryngology

## 2023-06-04 LAB — CULTURE, BLOOD (ROUTINE X 2)
Culture: NO GROWTH
Culture: NO GROWTH
Special Requests: ADEQUATE

## 2023-06-04 NOTE — Discharge Summary (Signed)
Physician Discharge Summary   Patient: Martin [name] MRN: [id number] DOB: 08-28-1965  Admit date:     05/30/2023  Discharge date: 06/04/23  Discharge Physician: Alberteen Sam   PCP: [facility]     Recommendations at discharge:  Follow up with ENT Dr. Suszanne Conners in 2 weeks for MRSA abscess Complete 2 weeks clindamycin     Discharge Diagnoses: Principal Problem:   MRSA abscess and cellulitis of neck Active Problems:   Insulin dependent type 2 diabetes mellitus (HCC)   Hypertension associated with diabetes (HCC)   Hyperlipidemia associated with type 2 diabetes mellitus (HCC)   Hyponatremia   Sepsis ruled out     Hospital Course: 57 y.o. M with obesity, DM, HTN, HLD and hx MRSA skin infection of neck who is admitted with cellulitis of the right posterior neck.      * Cellulitis of neck Admitted and started on vancomycin and Unasyn.    ENT consulted, they observed for 48 hours, then performed I&D.  WBC normalized after I&D, and ENT gave patient wound care instructions and follow up instructions  Discharged on clindamycin and doxycycline.  Culture post-discharge showed clinda susceptibility, doxycycline stopped.    Insulin dependent type 2 diabetes mellitus, with hyperglycemia, poorly controlled (HCC) HgbA1c 11.6%  Hypertension associated with diabetes (HCC)  Hyponatremia Mild, asymptomatic  Hyperlipidemia associated with type 2 diabetes mellitus (HCC)            The [facility] Controlled Substances Registry was reviewed for this patient prior to discharge.  Consultants: ENT  Procedures performed:  I&D   Disposition: Home Diet recommendation:  Carb modified diet  DISCHARGE MEDICATION: Allergies as of 06/03/2023   No Known Allergies      Medication List     TAKE these medications    atorvastatin 80 MG tablet Commonly known as: LIPITOR Take 1 tablet (80 mg total) by mouth at bedtime.    clindamycin 300 MG capsule Commonly known as: CLEOCIN Take 1 capsule (300 mg total) by mouth 4 (four) times daily for 10 days.   Dexcom G7 Sensor Misc Change 1 sensor every 10 days on insulin 3 times daily   FreeStyle Libre 3 Sensor Misc Change sensor every 14 days.   doxycycline 100 MG tablet Commonly known as: VIBRA-TABS Take 1 tablet (100 mg total) by mouth 2 (two) times daily for 10 days.   freestyle lancets Use three times daily as directed   FreeStyle Libre 3 Reader Covel Use 4 (four) times daily.   FREESTYLE LITE test strip Generic drug: glucose blood Use three times daily as directed   gabapentin 300 MG capsule Commonly known as: Neurontin Take 1 capsule (300 mg total) by mouth 3 (three) times daily.   Jardiance 25 MG Tabs tablet Generic drug: empagliflozin Take 1 tablet (25 mg total) by mouth daily.   lisinopril 10 MG tablet Commonly known as: ZESTRIL Take 1 tablet (10 mg total) by mouth daily.   meloxicam 15 MG tablet Commonly known as: MOBIC Take 1 tablet (15 mg total) by mouth daily.   metFORMIN 500 MG 24 hr tablet Commonly known as: GLUCOPHAGE-XR Take 2 tablets (1,000 mg total) by mouth 2 (two) times daily with a meal.   Muscle Rub 10-15 % Crea Apply 1 Application topically as needed for muscle pain.   sildenafil 100 MG tablet Commonly known as: Viagra Take 1 tablet (100 mg total) by mouth up to once daily as needed   tadalafil 20 MG tablet  Commonly known as: CIALIS Take 1 tablet (20 mg total) by mouth one hour before sex as needed for E.D.   TechLite Pen Needles 32G X 6 MM Misc Generic drug: Insulin Pen Needle Use to inject insulin 2 times a day for 90 days. What changed: Another medication with the same name was added. Make sure you understand how and when to take each.   Insulin Pen Needle 32G X 6 MM Misc Use to inject insulin 2 (two) times daily. What changed: You were already taking a medication with the same name, and this prescription  was added. Make sure you understand how and when to take each.   Evaristo Bury FlexTouch 200 UNIT/ML FlexTouch Pen Generic drug: insulin degludec Inject 110 Units into the skin daily.        Follow-up Information     Newman Pies, MD. Call.   Specialty: Otolaryngology Why: for an appointment as instructed Contact information: [address] [place] [address] [phone]                 Discharge Instructions     Discharge instructions   Complete by: As directed    **IMPORTANT DISCHARGE INSTRUCTIONS**   From Dr. Maryfrances Bunnell: You were admitted for a MRSA infection of the neck You were treated with IV vancomycin and clindamycin  You had an incision and drainage by the surgeon Dr. Tracie Harrier should follow up with Dr. Suszanne Conners in 2 weeks as recommended  In the meantime, take doxycycline 100 mg twice daily and clindamycin 300 mg four times daily for the next 10 days starting today  I will call you if you can stop the doxycycline  Don't spend too much time in the sun while on the doxycycline  Cover the wound with a nonstick dressing, and change as needed.  I suspect you will need to change several times per day at first  Do not submerge in water, but you may shower  Return for fever, chills, or worsening redness and pain   Increase activity slowly   Complete by: As directed        Discharge Exam: Filed Weights   05/30/23 1327  Weight: 109.8 kg    General: Pt is alert, awake, not in acute distress Cardiovascular: RRR, nl S1-S2, no murmurs appreciated.   No LE edema.   Respiratory: Normal respiratory rate and rhythm.  CTAB without rales or wheezes. Abdominal: Abdomen soft and non-tender.  No distension or HSM.   Neuro/Psych: Strength symmetric in upper and lower extremities.  Judgment and insight appear normal.   Condition at discharge: good  The results of significant diagnostics from this hospitalization (including imaging, microbiology, ancillary and  laboratory) are listed below for reference.   Imaging Studies: CT Soft Tissue Neck W Contrast Result Date: 05/30/2023 CLINICAL DATA:  Neck mass, nonpulsatile. Abscess of posterior neck. Symptoms worsening over the last 2 days. Redness and swelling. EXAM: CT NECK WITH CONTRAST TECHNIQUE: Multidetector CT imaging of the neck was performed using the standard protocol following the bolus administration of intravenous contrast. RADIATION DOSE REDUCTION: This exam was performed according to the departmental dose-optimization program which includes automated exposure control, adjustment of the mA and/or kV according to patient size and/or use of iterative reconstruction technique. CONTRAST:  75mL OMNIPAQUE IOHEXOL 300 MG/ML  SOLN COMPARISON:  10/17/2022 FINDINGS: Pharynx and larynx: No mucosal or submucosal lesion. Salivary glands: Parotid and submandibular glands are normal. Thyroid: Normal Lymph nodes: No enlarged or low-density nodes on  either side of the neck. Vascular: Normal Limited intracranial: Chronic arachnoid cyst on the left as seen previously, not primarily evaluated. Visualized orbits: Normal Mastoids and visualized paranasal sinuses: Clear Skeleton: Mild cervical spondylosis. Upper chest: Lung apices are clear. Other: Edematous change throughout the subcutaneous fat of the right posterior neck consistent with regional cellulitis. Overlying skin thickening. Question few small punctate bits of air/gas which could be subsequent to the infection or could relate to attempted lancing or drainage. There is not a well-circumscribed or apparently drainable fluid collection in the region. No evidence of deep space extension. IMPRESSION: Edematous change throughout the subcutaneous fat of the right posterior neck consistent with regional cellulitis. Overlying skin thickening. Question few small punctate bit of air/gas which could be subsequent to the infection or could relate to attempted lancing or drainage.  There is not a well-circumscribed or apparently drainable fluid collection in the region. No evidence of deep space extension. Electronically Signed   By: Paulina Fusi M.D.   On: 05/30/2023 16:11    Microbiology: Results for orders placed or performed during the hospital encounter of 05/30/23  Blood Culture (routine x 2)     Status: None   Collection Time: 05/30/23  3:30 PM   Specimen: BLOOD  Result Value Ref Range Status   Specimen Description   Final    BLOOD RIGHT ANTECUBITAL Performed at [facility], [address] [place], [place] [address]    Special Requests   Final    BOTTLES DRAWN AEROBIC AND ANAEROBIC Blood Culture adequate volume Performed at [facility], [address], [place], [place] [address]    Culture   Final    NO GROWTH 5 DAYS Performed at [facility], [address], [place], [place] [address]    Report Status 06/04/2023 FINAL  Final  Blood Culture (routine x 2)     Status: None   Collection Time: 05/30/23  7:15 PM   Specimen: BLOOD  Result Value Ref Range Status   Specimen Description BLOOD SITE NOT SPECIFIED  Final   Special Requests   Final    BOTTLES DRAWN AEROBIC AND ANAEROBIC Blood Culture results may not be optimal due to an inadequate volume of blood received in culture bottles   Culture   Final    NO GROWTH 5 DAYS Performed at [facility], [address], [place], [place] [address]    Report Status 06/04/2023 FINAL  Final  MRSA Next Gen by PCR, Nasal     Status: Abnormal   Collection Time: 06/01/23 11:01 AM   Specimen: Nasal Mucosa; Nasal Swab  Result Value Ref Range Status   MRSA by PCR Next Gen DETECTED (A) NOT DETECTED Final    Comment: RESULT CALLED TO, READ BACK BY AND VERIFIED WITH: RN Kristine on 1224 @1319H  by SM (NOTE) The GeneXpert MRSA Assay (FDA approved for NASAL specimens only), is one component of a comprehensive MRSA colonization surveillance program. It is not intended  to diagnose MRSA infection nor to guide or monitor treatment for MRSA infections. Test performance is not FDA approved in patients less than 63 years old. Performed at [facility], [address], [place], [place] [address]   Aerobic/Anaerobic Culture w Gram Stain (surgical/deep wound)     Status: None (Preliminary result)   Collection Time: 06/02/23  2:03 PM   Specimen: Abscess  Result Value Ref Range Status   Specimen Description ABSCESS RIGHT NECK  Final   Special Requests  Normal  Final   Gram Stain   Final    FEW WBC PRESENT, PREDOMINANTLY PMN RARE GRAM POSITIVE COCCI Performed at [facility], [address] [place], [place] [address]    Culture FEW METHICILLIN RESISTANT STAPHYLOCOCCUS AUREUS  Final   Report Status PENDING  Incomplete   Organism ID, Bacteria METHICILLIN RESISTANT STAPHYLOCOCCUS AUREUS  Final      Susceptibility   Methicillin resistant staphylococcus aureus - MIC*    CIPROFLOXACIN >=8 RESISTANT Resistant     ERYTHROMYCIN >=8 RESISTANT Resistant     GENTAMICIN <=0.5 SENSITIVE Sensitive     OXACILLIN >=4 RESISTANT Resistant     TETRACYCLINE >=16 RESISTANT Resistant     VANCOMYCIN 1 SENSITIVE Sensitive     TRIMETH/SULFA <=10 SENSITIVE Sensitive     CLINDAMYCIN <=0.25 SENSITIVE Sensitive     RIFAMPIN <=0.5 SENSITIVE Sensitive     Inducible Clindamycin NEGATIVE Sensitive     LINEZOLID 2 SENSITIVE Sensitive     * FEW METHICILLIN RESISTANT STAPHYLOCOCCUS AUREUS    Labs: CBC: Recent Labs  Lab 05/30/23 1448 05/31/23 0256 06/01/23 0246 06/02/23 1005 06/03/23 0258  WBC 17.8* 19.6* 17.0* 15.2* 12.9*  NEUTROABS 14.3*  --   --   --   --   HGB 14.2 13.1 13.2 13.2 14.0  HCT 41.5 39.1 39.6 39.1 41.7  MCV 83.8 84.4 84.4 84.1 83.9  PLT 231 246 247 281 313   Basic Metabolic Panel: Recent Labs  Lab 05/30/23 1448 05/31/23 0256 06/02/23 0835 06/03/23 0258  NA 130* 131* 136 135  K 4.1 3.8 3.6 3.5  CL 97* 100 101 103  CO2 25 22 23 22   GLUCOSE  385* 322* 109* 153*  BUN 10 6 10 10   CREATININE 0.77 0.75 0.67 0.68  CALCIUM 9.1 8.7* 9.3 9.2   Liver Function Tests: No results for input(s): "AST", "ALT", "ALKPHOS", "BILITOT", "PROT", "ALBUMIN" in the last 168 hours. CBG: Recent Labs  Lab 06/02/23 0808 06/02/23 1233 06/02/23 1616 06/02/23 2119 06/03/23 0605  GLUCAP 111* 128* 128* 183* 119*    Discharge time spent: approximately 45 minutes spent on discharge counseling, evaluation of patient on day of discharge, and coordination of discharge planning with nursing, social work, pharmacy and case management  Signed: Alberteen Sam, MD [facility] 06/04/2023

## 2023-06-04 NOTE — Telephone Encounter (Signed)
ENT Telephone note:  Patient called to make sure re: abx regimen after medicine physician called to report he should stop taking doxycycline and just take clindamycin. Continues to have some drainage but doing about the same, no fevers. Per chart review, was recommended only clindamycin 300mg  QID x10d. Cultures growing MRSA, sensitive to Clinda. Advised him to only take clinda and can d/c doxy per chart review.

## 2023-06-05 ENCOUNTER — Other Ambulatory Visit (HOSPITAL_COMMUNITY): Payer: Self-pay

## 2023-06-07 LAB — AEROBIC/ANAEROBIC CULTURE W GRAM STAIN (SURGICAL/DEEP WOUND): Special Requests: NORMAL

## 2023-06-15 ENCOUNTER — Other Ambulatory Visit (HOSPITAL_COMMUNITY): Payer: Self-pay

## 2023-06-15 ENCOUNTER — Other Ambulatory Visit (INDEPENDENT_AMBULATORY_CARE_PROVIDER_SITE_OTHER): Payer: Self-pay | Admitting: Otolaryngology

## 2023-06-15 MED ORDER — CLINDAMYCIN HCL 300 MG PO CAPS
300.0000 mg | ORAL_CAPSULE | Freq: Four times a day (QID) | ORAL | 0 refills | Status: AC
  Filled 2023-06-15: qty 40, 10d supply, fill #0

## 2023-06-21 ENCOUNTER — Other Ambulatory Visit (HOSPITAL_COMMUNITY): Payer: Self-pay

## 2023-06-23 ENCOUNTER — Telehealth (INDEPENDENT_AMBULATORY_CARE_PROVIDER_SITE_OTHER): Payer: Self-pay | Admitting: Otolaryngology

## 2023-06-23 NOTE — Telephone Encounter (Signed)
LVM to confirm appt & location [phone] afm

## 2023-06-26 ENCOUNTER — Ambulatory Visit (INDEPENDENT_AMBULATORY_CARE_PROVIDER_SITE_OTHER): Payer: Commercial Managed Care - PPO | Admitting: Otolaryngology

## 2023-06-26 ENCOUNTER — Encounter (INDEPENDENT_AMBULATORY_CARE_PROVIDER_SITE_OTHER): Payer: Self-pay

## 2023-06-26 VITALS — BP 100/65 | HR 82 | Ht 71.0 in | Wt 240.0 lb

## 2023-06-26 DIAGNOSIS — L0211 Cutaneous abscess of neck: Secondary | ICD-10-CM

## 2023-06-27 NOTE — Progress Notes (Signed)
Patient ID: Martin [name], male   DOB: [birth year], 58 y.o.   MRN: [id number]  Follow-up: Right neck abscess  HPI: The patient is a 58 year old male who returns today for his follow-up evaluation.  The patient has a history of recurrent right neck abscess.  He underwent incision and drainage of his right neck abscess 1 month ago at [facility].  He was admitted for IV antibiotic treatment.  The patient was discharged home on oral clindamycin.  He returns today reporting significant improvement in his right neck infection.  The right neck swelling has mostly resolved.  He has not noted any recent drainage.  Culture from his right neck drainage was positive for MRSA.  It was sensitive to clindamycin.  Exam: General: Communicates without difficulty, well nourished, no acute distress. Head: Normocephalic, no evidence injury, no tenderness, facial buttresses intact without stepoff. Face/sinus: No tenderness to palpation and percussion. Facial movement is normal and symmetric. Eyes: PERRL, EOMI. No scleral icterus, conjunctivae clear. Neuro: CN II exam reveals vision grossly intact.  No nystagmus at any point of gaze. Ears: Auricles well formed without lesions.  Ear canals are intact without mass or lesion.  No erythema or edema is appreciated.  The TMs are intact without fluid. Nose: External evaluation reveals normal support and skin without lesions.  Dorsum is intact.  Anterior rhinoscopy reveals congested mucosa over anterior aspect of inferior turbinates and intact septum.  No purulence noted. Oral:  Oral cavity and oropharynx are intact, symmetric, without erythema or edema.  Mucosa is moist without lesions. Neck: Full range of motion without pain.  The right posterior neck I&D site has healed.  Thyroid bed within normal limits to palpation.  Parotid glands and submandibular glands equal bilaterally without mass.  Trachea is midline. Neuro:  CN 2-12 grossly intact.   Assessment: 1.  The patient's  right neck abscess has mostly healed.  No significant erythema, edema, or drainage is noted today. 2.  His wound culture was positive for MRSA.  Plan: 1.  The physical exam findings are reviewed with the patient. 2.  Complete his current course of oral clindamycin. 3.  No other ENT intervention is needed at this time. 4.  The patient is encouraged to call with any questions or concerns.

## 2023-07-12 ENCOUNTER — Other Ambulatory Visit (HOSPITAL_COMMUNITY): Payer: Self-pay

## 2023-08-03 ENCOUNTER — Other Ambulatory Visit (HOSPITAL_COMMUNITY): Payer: Self-pay

## 2023-08-03 DIAGNOSIS — L97528 Non-pressure chronic ulcer of other part of left foot with other specified severity: Secondary | ICD-10-CM | POA: Diagnosis not present

## 2023-08-03 DIAGNOSIS — E11621 Type 2 diabetes mellitus with foot ulcer: Secondary | ICD-10-CM | POA: Diagnosis not present

## 2023-08-03 DIAGNOSIS — J340 Abscess, furuncle and carbuncle of nose: Secondary | ICD-10-CM | POA: Diagnosis not present

## 2023-08-03 MED ORDER — MUPIROCIN 2 % EX OINT
1.0000 | TOPICAL_OINTMENT | Freq: Three times a day (TID) | CUTANEOUS | 0 refills | Status: DC
  Filled 2023-08-03: qty 22, 10d supply, fill #0

## 2023-08-03 MED ORDER — TRESIBA FLEXTOUCH 200 UNIT/ML ~~LOC~~ SOPN
100.0000 [IU] | PEN_INJECTOR | Freq: Every day | SUBCUTANEOUS | 3 refills | Status: DC
Start: 2023-08-03 — End: 2023-12-01
  Filled 2023-08-03: qty 12, 24d supply, fill #0
  Filled 2023-08-03: qty 36, 72d supply, fill #0
  Filled 2023-08-24 – 2023-08-31 (×3): qty 12, 24d supply, fill #1
  Filled 2023-09-28: qty 15, 30d supply, fill #2
  Filled 2023-11-20: qty 15, 30d supply, fill #3

## 2023-08-03 MED ORDER — OXYCODONE HCL 10 MG PO TABS
10.0000 mg | ORAL_TABLET | Freq: Three times a day (TID) | ORAL | 0 refills | Status: DC | PRN
  Filled 2023-08-03: qty 15, 5d supply, fill #0

## 2023-08-03 MED ORDER — CLINDAMYCIN HCL 300 MG PO CAPS
300.0000 mg | ORAL_CAPSULE | Freq: Four times a day (QID) | ORAL | 0 refills | Status: DC
  Filled 2023-08-03: qty 40, 10d supply, fill #0

## 2023-08-07 ENCOUNTER — Encounter (HOSPITAL_BASED_OUTPATIENT_CLINIC_OR_DEPARTMENT_OTHER): Payer: Self-pay

## 2023-08-07 ENCOUNTER — Other Ambulatory Visit: Payer: Self-pay

## 2023-08-07 ENCOUNTER — Emergency Department (HOSPITAL_BASED_OUTPATIENT_CLINIC_OR_DEPARTMENT_OTHER)
Admission: EM | Admit: 2023-08-07 | Discharge: 2023-08-07 | Disposition: A | Discharge status: Home / Self Care | Attending: Emergency Medicine | Admitting: Emergency Medicine

## 2023-08-07 ENCOUNTER — Emergency Department (HOSPITAL_BASED_OUTPATIENT_CLINIC_OR_DEPARTMENT_OTHER)

## 2023-08-07 ENCOUNTER — Ambulatory Visit: Payer: Commercial Managed Care - PPO | Admitting: Podiatry

## 2023-08-07 ENCOUNTER — Other Ambulatory Visit (HOSPITAL_COMMUNITY): Payer: Self-pay

## 2023-08-07 DIAGNOSIS — R339 Retention of urine, unspecified: Secondary | ICD-10-CM | POA: Diagnosis not present

## 2023-08-07 DIAGNOSIS — N3001 Acute cystitis with hematuria: Secondary | ICD-10-CM | POA: Diagnosis not present

## 2023-08-07 DIAGNOSIS — Z794 Long term (current) use of insulin: Secondary | ICD-10-CM | POA: Insufficient documentation

## 2023-08-07 DIAGNOSIS — N401 Enlarged prostate with lower urinary tract symptoms: Secondary | ICD-10-CM | POA: Insufficient documentation

## 2023-08-07 DIAGNOSIS — K5730 Diverticulosis of large intestine without perforation or abscess without bleeding: Secondary | ICD-10-CM | POA: Diagnosis not present

## 2023-08-07 DIAGNOSIS — N1330 Unspecified hydronephrosis: Secondary | ICD-10-CM | POA: Diagnosis not present

## 2023-08-07 DIAGNOSIS — R350 Frequency of micturition: Secondary | ICD-10-CM | POA: Diagnosis present

## 2023-08-07 LAB — URINALYSIS, W/ REFLEX TO CULTURE (INFECTION SUSPECTED)
Bilirubin Urine: NEGATIVE
Glucose, UA: >1000 mg/dL — AB
Ketones, ur: NEGATIVE mg/dL
Nitrite: POSITIVE — AB
Protein, ur: 30 mg/dL — AB
RBC / HPF: >50 RBC/hpf (ref 0–5)
Specific Gravity, Urine: 1.013 (ref 1.005–1.030)
WBC, UA: >50 WBC/hpf (ref 0–5)
pH: 5.5 (ref 5.0–8.0)

## 2023-08-07 LAB — CBC
HCT: 43.1 % (ref 39.0–52.0)
Hemoglobin: 14.2 g/dL (ref 13.0–17.0)
MCH: 27.9 pg (ref 26.0–34.0)
MCHC: 32.9 g/dL (ref 30.0–36.0)
MCV: 84.7 fL (ref 80.0–100.0)
Platelets: 387 10*3/uL (ref 150–400)
RBC: 5.09 MIL/uL (ref 4.22–5.81)
RDW: 13.1 % (ref 11.5–15.5)
WBC: 27.7 10*3/uL — ABNORMAL HIGH (ref 4.0–10.5)
nRBC: 0 % (ref 0.0–0.2)

## 2023-08-07 LAB — BASIC METABOLIC PANEL
Anion gap: 12 (ref 5–15)
BUN: 30 mg/dL — ABNORMAL HIGH (ref 6–20)
CO2: 23 mmol/L (ref 22–32)
Calcium: 9.7 mg/dL (ref 8.9–10.3)
Chloride: 93 mmol/L — ABNORMAL LOW (ref 98–111)
Creatinine, Ser: 0.98 mg/dL (ref 0.61–1.24)
GFR, Estimated: >60 mL/min (ref >60)
Glucose, Bld: 145 mg/dL — ABNORMAL HIGH (ref 70–99)
Potassium: 4.5 mmol/L (ref 3.5–5.1)
Sodium: 128 mmol/L — ABNORMAL LOW (ref 135–145)

## 2023-08-07 MED ORDER — ONDANSETRON 4 MG PO TBDP
4.0000 mg | ORAL_TABLET | Freq: Three times a day (TID) | ORAL | 0 refills | Status: DC | PRN
  Filled 2023-08-07 – 2023-08-08 (×2): qty 20, 7d supply, fill #0

## 2023-08-07 MED ORDER — CEFTRIAXONE SODIUM 1 G IJ SOLR
1.0000 g | Freq: Once | INTRAMUSCULAR | Status: AC
  Administered 2023-08-07: 1 g via INTRAMUSCULAR
  Filled 2023-08-07: qty 10

## 2023-08-07 MED ORDER — ONDANSETRON 4 MG PO TBDP
4.0000 mg | ORAL_TABLET | Freq: Once | ORAL | Status: AC
  Administered 2023-08-07: 4 mg via ORAL
  Filled 2023-08-07: qty 1

## 2023-08-07 MED ORDER — LIDOCAINE HCL (PF) 1 % IJ SOLN
1.0000 mL | Freq: Once | INTRAMUSCULAR | Status: AC
  Administered 2023-08-07: 2.1 mL
  Filled 2023-08-07: qty 5

## 2023-08-07 MED ORDER — CEFADROXIL 500 MG PO CAPS
1.0000 g | ORAL_CAPSULE | Freq: Every day | ORAL | 0 refills | Status: AC
Start: 2023-08-06 — End: 2023-08-17
  Filled 2023-08-07: qty 20, 10d supply, fill #0

## 2023-08-07 NOTE — ED Provider Notes (Signed)
 [place] EMERGENCY DEPARTMENT AT [facility] Provider Note   CSN: [id number] Arrival date & time: 08/07/23  1401     History  Chief Complaint  Patient presents with   Urinary Frequency   Urinary Tract Infection    Martin [name] is a 58 y.o. male.  Patient presents today due to increased urinary frequency and concern for possible UTI.  Symptoms started on Friday 2/28.  He was also having burning when he pees.  He tells me that he has a history of kidney stones that felt similar to this. Denies any back pain or flank pain. No fevers/chills. Does tells me that he has had to strain to urinate for quite some time now. Has never been told he has an enlarged prostate.  Of note, he has been following with his PCP for a infected lesion at the tip of his nose.  This was cultured and culture returned growing up MSSA.  He was previously on clindamycin but was transitioned to cefadroxil earlier today.  He has follow-up with Dr. Suszanne Conners tomorrow for his nose.  He has only had 1 dose of the cefadroxil since his PCP made the switch with his antibiotics.      Home Medications Prior to Admission medications   Medication Sig Start Date End Date Taking? Authorizing Provider  ondansetron (ZOFRAN-ODT) 4 MG disintegrating tablet Take 1 tablet (4 mg total) by mouth every 8 (eight) hours as needed for nausea or vomiting. 08/07/23  Yes Alicia Amel, MD  atorvastatin (LIPITOR) 80 MG tablet Take 1 tablet (80 mg total) by mouth at bedtime. 11/23/22     cefadroxil (DURICEF) 500 MG capsule Take 2 capsules (1,000 mg total) by mouth daily for 10 days. 08/06/23 08/17/23    clindamycin (CLEOCIN) 300 MG capsule Take 1 capsule (300 mg total) by mouth every 6 (six) hours for 10 days. 08/03/23     Continuous Blood Gluc Sensor (DEXCOM G7 SENSOR) MISC Change 1 sensor every 10 days on insulin 3 times daily 01/13/22     Continuous Glucose Receiver (FREESTYLE LIBRE 3 READER) DEVI Use 4 (four) times daily. 11/23/22      Continuous Glucose Sensor (FREESTYLE LIBRE 3 SENSOR) MISC Change sensor every 14 days. 11/23/22     empagliflozin (JARDIANCE) 25 MG TABS tablet Take 1 tablet (25 mg total) by mouth daily. 03/29/22     gabapentin (NEURONTIN) 300 MG capsule Take 1 capsule (300 mg total) by mouth 3 (three) times daily. 10/20/22   Rolly Salter, MD  glucose blood (FREESTYLE LITE) test strip Use three times daily as directed 03/09/21     insulin degludec (TRESIBA FLEXTOUCH) 200 UNIT/ML FlexTouch Pen Inject 110 Units into the skin daily. 10/20/22   Rolly Salter, MD  insulin degludec (TRESIBA FLEXTOUCH) 200 UNIT/ML FlexTouch Pen Inject 100 Units into the skin daily. 08/03/23     Insulin Pen Needle 32G X 6 MM MISC Use to inject insulin 2 times a day for 90 days. 12/24/21   Talmage Coin, MD  Insulin Pen Needle 32G X 6 MM MISC Use to inject insulin 2 (two) times daily. 06/01/23     Lancets (FREESTYLE) lancets Use three times daily as directed 03/09/21   Talmage Coin, MD  lisinopril (ZESTRIL) 10 MG tablet Take 1 tablet (10 mg total) by mouth daily. 11/23/22     meloxicam (MOBIC) 15 MG tablet Take 1 tablet (15 mg total) by mouth daily. 06/27/22     Menthol-Methyl Salicylate (MUSCLE RUB) 10-15 %  CREA Apply 1 Application topically as needed for muscle pain. 10/20/22   Rolly Salter, MD  metFORMIN (GLUCOPHAGE-XR) 500 MG 24 hr tablet Take 2 tablets (1,000 mg total) by mouth 2 (two) times daily with a meal. 11/23/22     mupirocin ointment (BACTROBAN) 2 % Apply 1 Application topically 3 (three) times daily for 7 days. 08/03/23     Oxycodone HCl 10 MG TABS Take 1 tablet (10 mg total) by mouth every 8 (eight) hours as needed for severe pain (7-10). 08/03/23     sildenafil (VIAGRA) 100 MG tablet Take 1 tablet (100 mg total) by mouth up to once daily as needed 12/11/20     tadalafil (CIALIS) 20 MG tablet Take 1 tablet (20 mg total) by mouth one hour before sex as needed for E.D. 03/07/23         Allergies    Patient has no known allergies.     Review of Systems   Review of Systems  Constitutional:  Negative for chills and fever.  Gastrointestinal:  Negative for abdominal pain.  Genitourinary:  Positive for difficulty urinating, dysuria and frequency. Negative for flank pain, hematuria and urgency.    Physical Exam Updated Vital Signs BP (!) 141/75   Pulse 80   Temp 98 F (36.7 C)   Resp 18   SpO2 95%  Physical Exam Constitutional:      General: He is not in acute distress. HENT:     Nose:     Comments: The entirety of the nose is erythematous with a ~4-53mm ulcerated lesion near the tip of the nose on the right side without active drainage or purulence  Cardiovascular:     Rate and Rhythm: Normal rate and regular rhythm.     Heart sounds: No murmur heard. Pulmonary:     Effort: Pulmonary effort is normal. No respiratory distress.     Breath sounds: Normal breath sounds.  Abdominal:     Comments: There is no suprapubic tenderness or CVA tenderness   Skin:    General: Skin is warm and dry.  Neurological:     General: No focal deficit present.  Psychiatric:        Mood and Affect: Mood normal.     ED Results / Procedures / Treatments   Labs (all labs ordered are listed, but only abnormal results are displayed) Labs Reviewed  URINALYSIS, W/ REFLEX TO CULTURE (INFECTION SUSPECTED) - Abnormal; Notable for the following components:      Result Value   APPearance CLOUDY (*)    Glucose, UA >1,000 (*)    Hgb urine dipstick MODERATE (*)    Protein, ur 30 (*)    Nitrite POSITIVE (*)    Leukocytes,Ua LARGE (*)    Bacteria, UA FEW (*)    All other components within normal limits  CBC - Abnormal; Notable for the following components:   WBC 27.7 (*)    All other components within normal limits  BASIC METABOLIC PANEL - Abnormal; Notable for the following components:   Sodium 128 (*)    Chloride 93 (*)    Glucose, Bld 145 (*)    BUN 30 (*)    All other components within normal limits  URINE CULTURE     EKG None  Radiology CT Renal Stone Study Result Date: 08/07/2023 CLINICAL DATA:  Dysuria.  History of kidney stones. EXAM: CT ABDOMEN AND PELVIS WITHOUT CONTRAST TECHNIQUE: Multidetector CT imaging of the abdomen and pelvis was performed following the standard  protocol without IV contrast. RADIATION DOSE REDUCTION: This exam was performed according to the departmental dose-optimization program which includes automated exposure control, adjustment of the mA and/or kV according to patient size and/or use of iterative reconstruction technique. COMPARISON:  CT abdomen/pelvis dated November 08, 2021. FINDINGS: Lower chest: No acute abnormality. Hepatobiliary: Decreased attenuation of the hepatic parenchyma, suggestive of hepatic steatosis. No suspicious focal hepatic lesion identified within the limits of an unenhanced exam. No gallstones, gallbladder wall thickening, or biliary dilatation. Pancreas: Unremarkable. No pancreatic ductal dilatation or surrounding inflammatory changes. Spleen: Normal in size without focal abnormality. Adrenals/Urinary Tract: Adrenal glands are unremarkable. Similar right renal atrophy. Mild-to-moderate right hydroureteronephrosis. No right-sided renal or ureteral calculi identified. No left-sided urolithiasis or hydronephrosis. Marked circumferential bladder wall thickening with surrounding stranding. No bladder calculi identified. Stomach/Bowel: Stomach is within normal limits. Appendix appears normal. No evidence of bowel wall thickening, distention, or inflammatory changes. Scattered colonic diverticula without evidence of acute diverticulitis. Vascular/Lymphatic: The abdominal aorta is normal in caliber with mild atherosclerotic calcification. Borderline enlarged left common iliac lymph node measures 10 mm in short axis, may be reactive. Reproductive: The prostate is enlarged measuring up to 6.9 cm in diameter. Other: No abdominopelvic ascites. No intraperitoneal free air.  Postoperative changes along the ventral abdominal wall. Small fat containing right inguinal hernia. Musculoskeletal: No acute osseous abnormality. No suspicious osseous lesion. Multilevel degenerative changes of the spine. IMPRESSION: 1. Mild-to-moderate right hydroureteronephrosis. No renal or ureteral calculi are identified and hydronephrosis may be related to recently passed stone, chronic obstruction, or infection. 2. Marked circumferential bladder wall thickening may be due to chronic outlet obstruction secondary to prostatomegaly or cystitis, recommend correlation with urinalysis. 3. Scattered colonic diverticula without evidence of acute diverticulitis. 4. Hepatic steatosis. 5.  Aortic Atherosclerosis (ICD10-I70.0). Electronically Signed   By: Hart Robinsons M.D.   On: 08/07/2023 17:41    Procedures Procedures    Medications Ordered in ED Medications  cefTRIAXone (ROCEPHIN) injection 1 g (1 g Intramuscular Given 08/07/23 2005)  lidocaine (PF) (XYLOCAINE) 1 % injection 1-2.1 mL (2.1 mLs Other Given 08/07/23 2005)  ondansetron (ZOFRAN-ODT) disintegrating tablet 4 mg (4 mg Oral Given 08/07/23 2043)    ED Course/ Medical Decision Making/ A&P    Medical Decision Making Patient presenting with urinary frequency fround to have grossly infected urinalysis with large leukocytes and positive nitrites in addition to hematuria. CT Renal Stone Study negative for renal or ureteral calculi but positive for mild-to-moderate R hydroureteronephrosis and marked circumferential bladder wall thickening which "may be due to chronic outlet obstruction secondary to prostatomegaly or cystitis."  Patient's history is suggestive of UTI secondary to chronic urinary retention related to his prostatomegaly as identified on CT. His initial post-void residual was . Recommended foley catheter placement, to which the patient was adamantly opposed. He attempted to void again with a second PVR of . Based on this,  continued to recommend foley placement but the patient politely declined. "I'm NOT wearing a catheter."  Note markedly elevated WBC to 27.7. Patient is not meeting any other criteria for SIRS/Sepsis and he is generally well-appearing on exam. Suspect this is more likely due to him having two infections (of the nose and of the urinary bladder) as opposed to overwhelming systemic illness given stable vitals and an overall reassuring exam. No evidence of pyelo on exam or imaging.   Treated this UTI with one dose of ceftriaxone here in the ED and advised that the cefadroxil his PCP started him on for  his nose should also be effective in treating his UTI. We did obtain a urine culture here that will be followed by our pharmacy team, though note yield may be impacted by him having antibiotics on board.   He will need follow-up with urology for his apparent chronic retention with prostatomegaly. Also recommend PCP follow-up for repeat labs and to ensure improvement. He voices understanding regarding return precautions for signs/symptoms of systemic illness.   Amount and/or Complexity of Data Reviewed Radiology: ordered.  Risk Prescription drug management.   Final Clinical Impression(s) / ED Diagnoses Final diagnoses:  Enlarged prostate with urinary retention  Acute cystitis with hematuria    Rx / DC Orders ED Discharge Orders          Ordered    ondansetron (ZOFRAN-ODT) 4 MG disintegrating tablet  Every 8 hours PRN        08/07/23 2129           Eliezer Mccoy, MD 08/07/23 11:22 PM    Alicia Amel, MD 08/07/23 2323    Edwin Dada P, DO 08/12/23 1739

## 2023-08-07 NOTE — ED Triage Notes (Signed)
 Pt c/o urinary symptoms onset Friday. Frequent urination, painful. Hx kidney stones, similar symptoms  On clindamycin, stopped taking "in case my symptoms were because of that," prescribed cefadroxil today

## 2023-08-07 NOTE — Discharge Instructions (Addendum)
 Mr. Martin [name],  Please call [facility] at [phone]. I believe that your enlarged prostate may be the cause of your urinary retention and subsequent UTI. You seem to be chronically retaining some urine, we recommended a foley catheter here, but understand that this is not something you would want at this time.  The antibiotics that your PCP has you on for your nose should also cover your urinary tract infection. Our pharmacy team will follow your urine cultures and will call you if the antibiotics that you are getting need to be adjusted.   IF you are not able to urinate or you start having back pain, chills, fevers, or if you start getting worse rather than better, please come back to see [place] ASAP. Also, please see your PCP to have repeat labwork done later this week. Your white blood cell count (which can be a sign of infection) was quite high and we want to make sure that comes down as we treat your infection.    Eliezer Mccoy, MD

## 2023-08-08 ENCOUNTER — Other Ambulatory Visit (HOSPITAL_COMMUNITY): Payer: Self-pay

## 2023-08-08 ENCOUNTER — Encounter (INDEPENDENT_AMBULATORY_CARE_PROVIDER_SITE_OTHER): Payer: Self-pay

## 2023-08-08 ENCOUNTER — Ambulatory Visit (INDEPENDENT_AMBULATORY_CARE_PROVIDER_SITE_OTHER): Admitting: Otolaryngology

## 2023-08-08 VITALS — BP 96/67 | HR 98 | Ht 71.0 in | Wt 229.0 lb

## 2023-08-08 DIAGNOSIS — J340 Abscess, furuncle and carbuncle of nose: Secondary | ICD-10-CM

## 2023-08-08 DIAGNOSIS — Z794 Long term (current) use of insulin: Secondary | ICD-10-CM

## 2023-08-08 DIAGNOSIS — E1165 Type 2 diabetes mellitus with hyperglycemia: Secondary | ICD-10-CM | POA: Diagnosis not present

## 2023-08-10 ENCOUNTER — Other Ambulatory Visit (HOSPITAL_COMMUNITY): Payer: Self-pay

## 2023-08-10 DIAGNOSIS — R339 Retention of urine, unspecified: Secondary | ICD-10-CM | POA: Diagnosis not present

## 2023-08-10 DIAGNOSIS — N3001 Acute cystitis with hematuria: Secondary | ICD-10-CM | POA: Diagnosis not present

## 2023-08-10 DIAGNOSIS — J340 Abscess, furuncle and carbuncle of nose: Secondary | ICD-10-CM | POA: Diagnosis not present

## 2023-08-10 LAB — URINE CULTURE: Culture: >=100000 — AB

## 2023-08-10 IMAGING — MR MR FOOT*R* W/O CM
5 series · 40 of 40 positions shown · non-contrast
Comparison: None.

CLINICAL DATA: Great toe osteomyelitis.

EXAM:
MRI OF THE RIGHT FOREFOOT WITHOUT CONTRAST
TECHNIQUE: Multiplanar, multisequence MR imaging of the right forefoot was
performed. No intravenous contrast was administered.

[Series 5: T1 · coronal · left · 3.5mm · 0.47mm/px · 10 of 37 slices shown (1 of 2)]
[im 1/37]
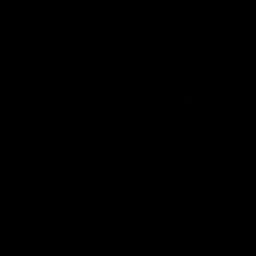
[im 5/37]
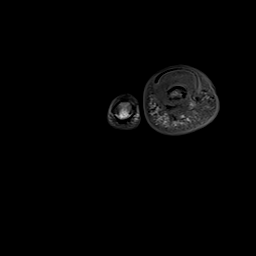
[im 9/37]
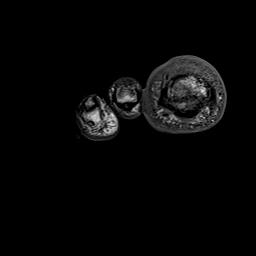
[im 13/37]
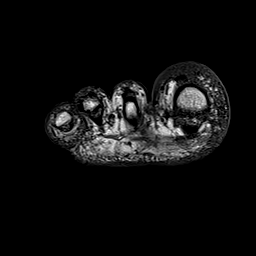
[im 17/37]
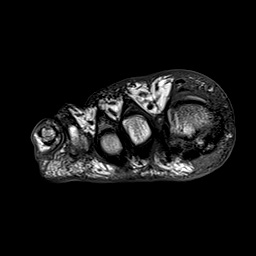
[im 21/37]
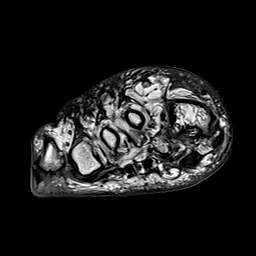
[im 25/37]
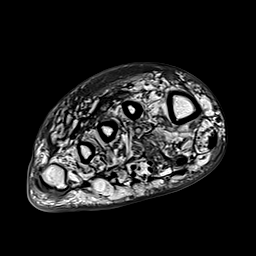
[im 29/37]
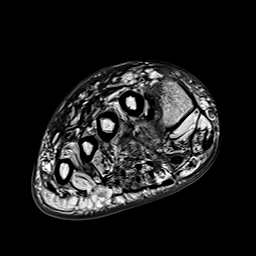
[im 33/37]
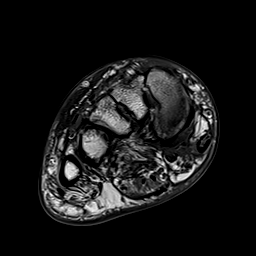
[im 37/37]
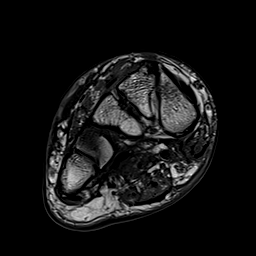

[Series 6: T2 fat-sat · coronal · left · 3.5mm · 0.38mm/px · 10 of 38 slices shown (1 of 2)]
[im 1/38]
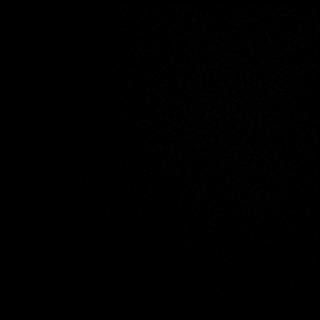
[im 5/38]
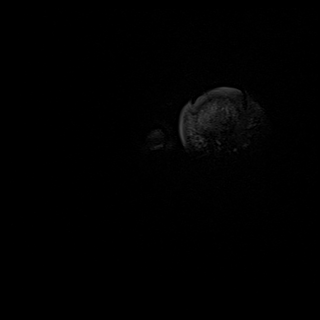
[im 9/38]
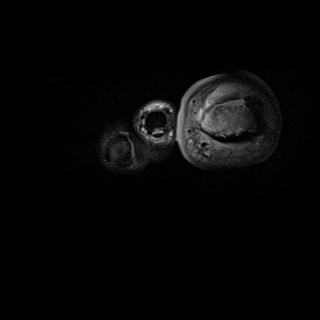
[im 13/38]
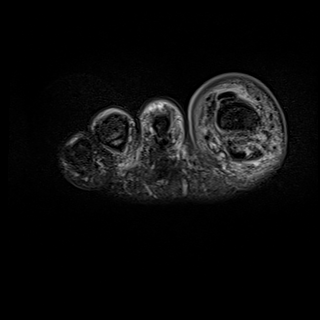
[im 17/38]
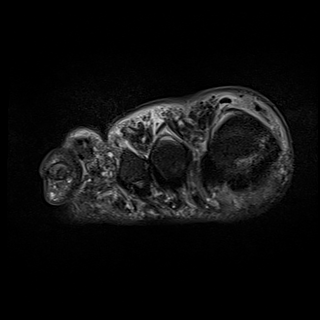
[im 21/38]
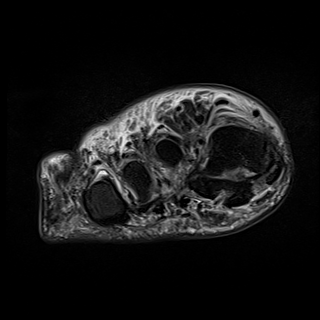
[im 25/38]
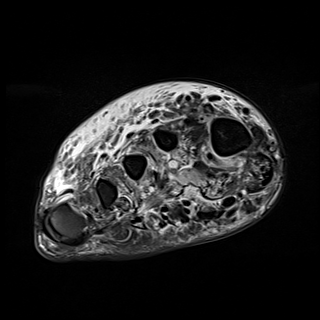
[im 29/38]
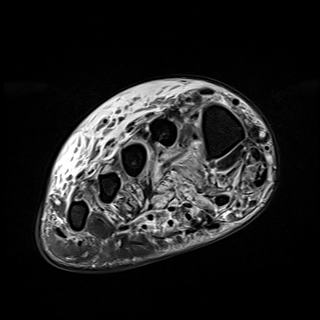
[im 33/38]
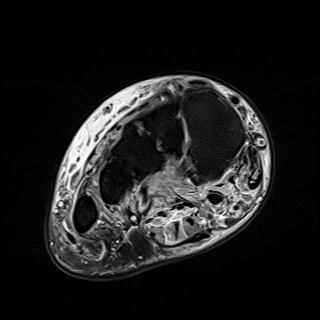
[im 38/38]
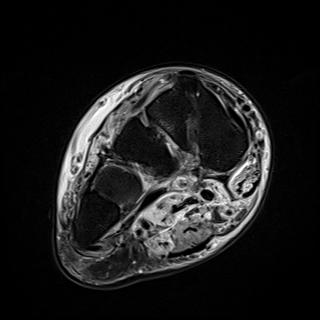

[Series 7: T2 fat-sat · axial · left · 3.0mm · 0.70mm/px · z∈[-71,-14]mm · 6 of 20 slices shown (2 of 2)]
[im 1/20]
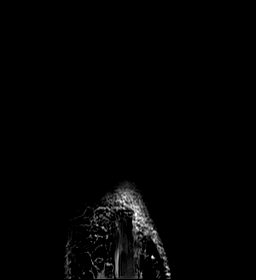
[im 4/20]
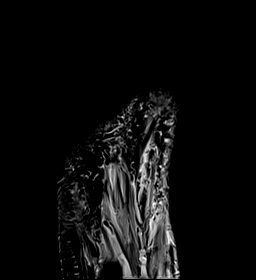
[im 8/20]
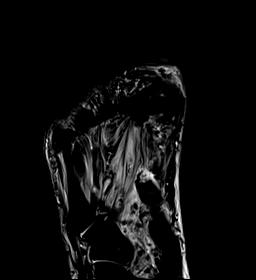
[im 12/20]
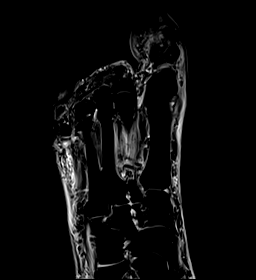
[im 16/20]
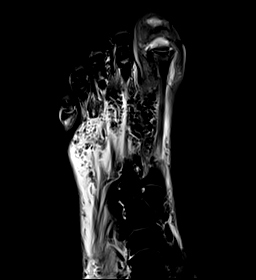
[im 20/20]
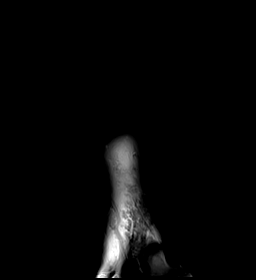

[Series 8: T1 · axial · left · 3.0mm · 0.70mm/px · z∈[-76,-19]mm · 6 of 20 slices shown (2 of 2)]
[im 1/20]
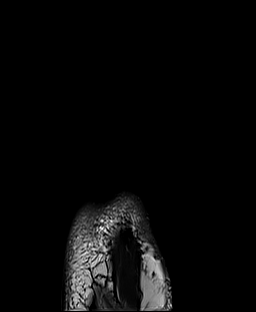
[im 4/20]
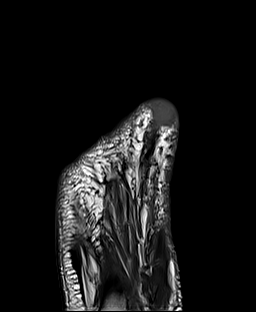
[im 8/20]
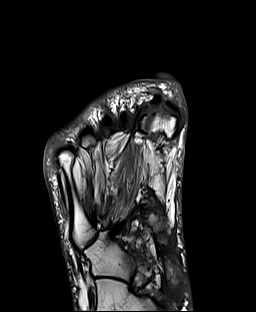
[im 12/20]
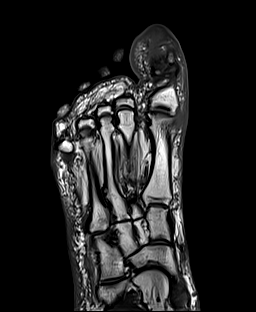
[im 16/20]
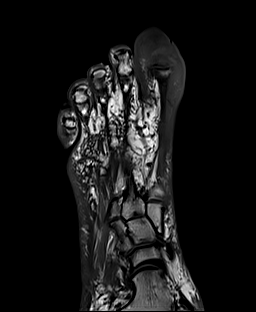
[im 20/20]
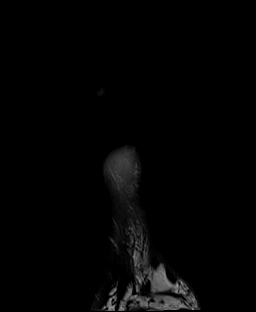

[Series 9: STIR · sagittal · left · 3.0mm · 0.35mm/px · 8 of 30 slices shown]
[im 1/30]
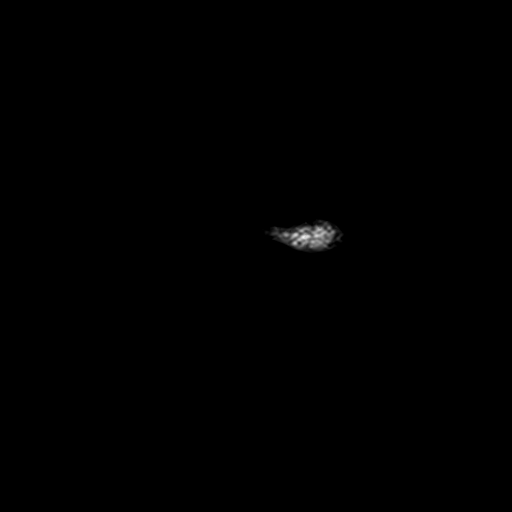
[im 5/30]
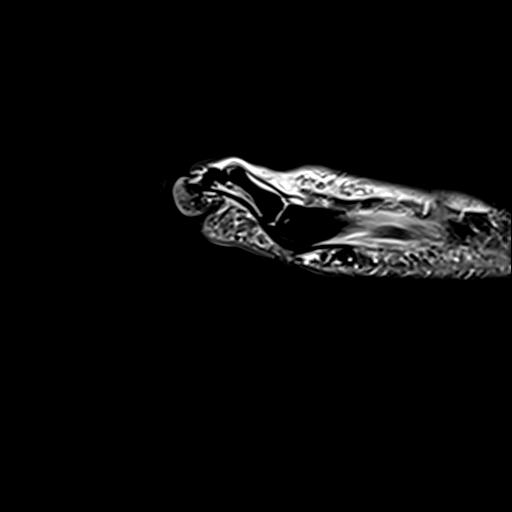
[im 9/30]
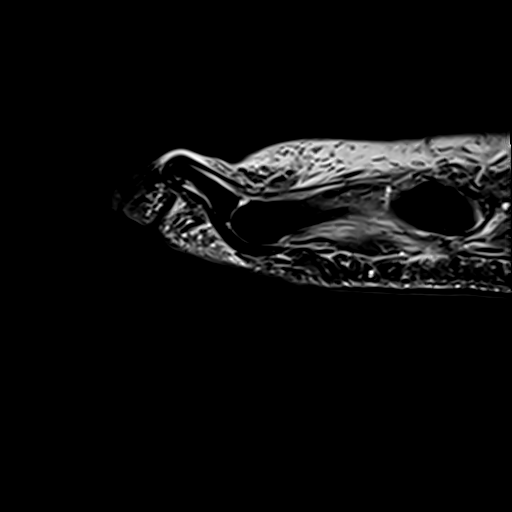
[im 13/30]
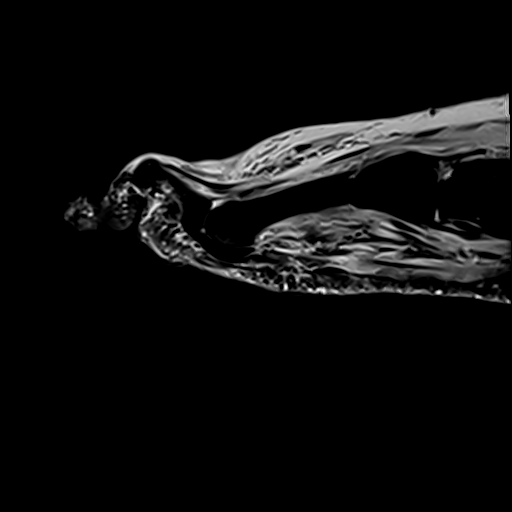
[im 17/30]
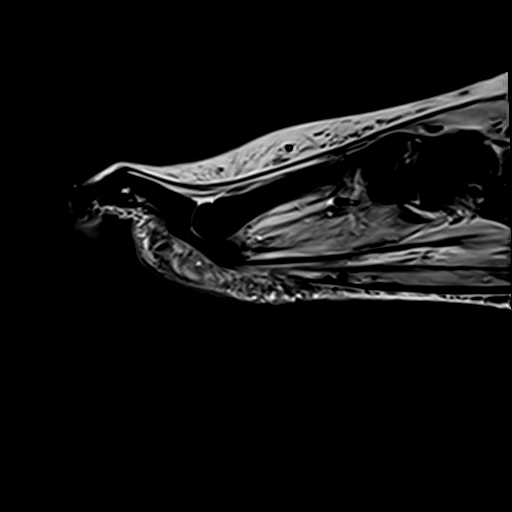
[im 21/30]
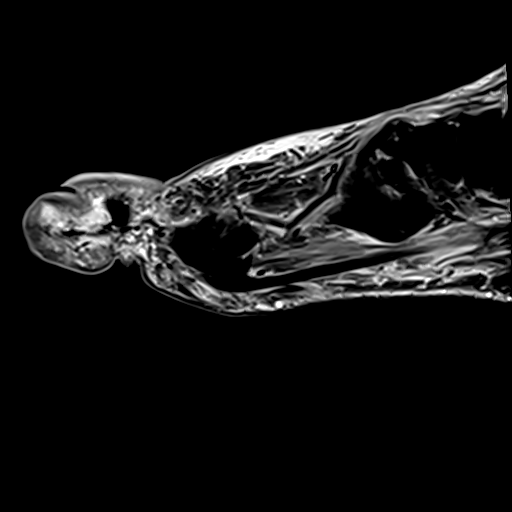
[im 25/30]
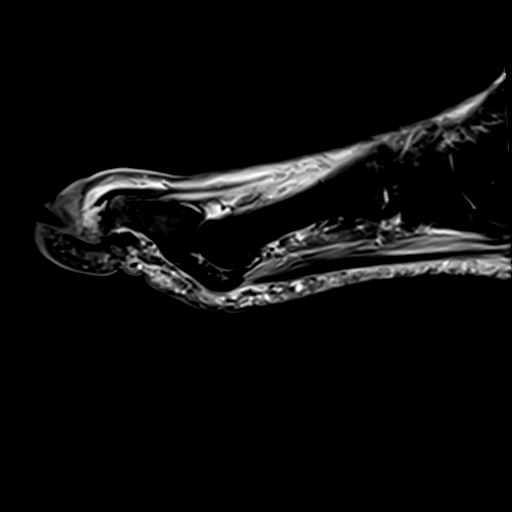
[im 30/30]
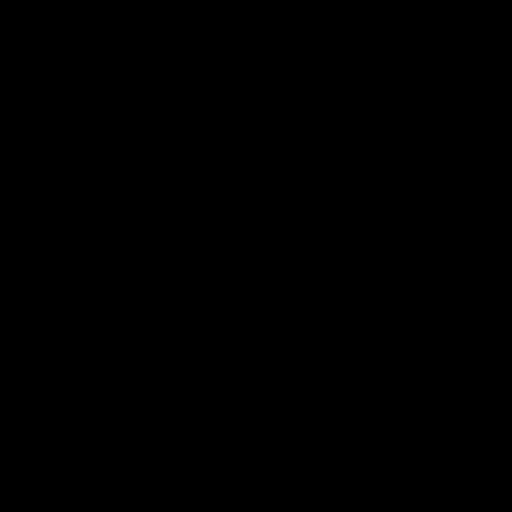

[40 of 40 positions shown; findings below may reference images not displayed]

FINDINGS: Bones/Joint/Cartilage

Soft tissue wound of the tip of the great toe. Severe bone marrow
edema involving the entirety of the first distal phalanx with
cortical destruction consistent with osteomyelitis.

No other areas of bone destruction. Mild bone marrow edema in the
medial hallux sesamoid likely reflecting sesamoiditis. Normal
alignment. No joint effusion. Moderate osteoarthritis of the second
tarsometatarsal joint.

Ligaments

Collateral ligaments are intact.  Lisfranc ligament is intact.

Muscles and Tendons

Flexor, peroneal and extensor compartment tendons are intact. T2
hyperintense signal throughout the plantar musculature likely
neurogenic.

Soft tissue
No fluid collection or hematoma. No soft tissue mass. Soft tissue
edema along the dorsal aspect of the foot extending into the great
toe consistent with cellulitis.
IMPRESSION: 1. Soft tissue wound at the tip of the great toe. Osteomyelitis of
the first distal phalanx. Cellulitis of the great toe extending into
the dorsal aspect of the foot.

## 2023-08-10 MED ORDER — SULFAMETHOXAZOLE-TRIMETHOPRIM 800-160 MG PO TABS
1.0000 | ORAL_TABLET | Freq: Two times a day (BID) | ORAL | 0 refills | Status: DC
  Filled 2023-08-10: qty 14, 7d supply, fill #0

## 2023-08-11 ENCOUNTER — Telehealth (HOSPITAL_BASED_OUTPATIENT_CLINIC_OR_DEPARTMENT_OTHER): Payer: Self-pay | Admitting: *Deleted

## 2023-08-11 DIAGNOSIS — J340 Abscess, furuncle and carbuncle of nose: Secondary | ICD-10-CM | POA: Insufficient documentation

## 2023-08-11 NOTE — Progress Notes (Signed)
 ED Antimicrobial Stewardship Positive Culture Follow Up   Martin [name] is an 58 y.o. male who presented to [facility] on 08/07/2023 with a chief complaint of  Chief Complaint  Patient presents with   Urinary Frequency   Urinary Tract Infection    Recent Results (from the past 720 hours)  Urine Culture     Status: Abnormal   Collection Time: 08/07/23  3:35 PM   Specimen: Urine, Clean Catch  Result Value Ref Range Status   Specimen Description   Final    URINE, CLEAN CATCH Performed at [facility], [address] [place], [place] [address]    Special Requests   Final    NONE Performed at [facility], [address] [place], [place] [address]    Culture (A)  Final    >=100,000 COLONIES/mL METHICILLIN RESISTANT STAPHYLOCOCCUS AUREUS   Report Status 08/10/2023 FINAL  Final   Organism ID, Bacteria METHICILLIN RESISTANT STAPHYLOCOCCUS AUREUS (A)  Final      Susceptibility   Methicillin resistant staphylococcus aureus - MIC*    CIPROFLOXACIN >=8 RESISTANT Resistant     GENTAMICIN <=0.5 SENSITIVE Sensitive     NITROFURANTOIN <=16 SENSITIVE Sensitive     OXACILLIN >=4 RESISTANT Resistant     TETRACYCLINE >=16 RESISTANT Resistant     VANCOMYCIN 1 SENSITIVE Sensitive     TRIMETH/SULFA <=10 SENSITIVE Sensitive     RIFAMPIN <=0.5 SENSITIVE Sensitive     Inducible Clindamycin NEGATIVE Sensitive     LINEZOLID 2 SENSITIVE Sensitive     * >=100,000 COLONIES/mL METHICILLIN RESISTANT STAPHYLOCOCCUS AUREUS    [x]  Patient discharged originally without antimicrobial agent and treatment may be indicated     - Call patient for a symptom check: > if patient is continuing to have symptoms of dysuria, is febrile, or generally does not feel well, then ask patient to return and obtain blood cultures  > if patient is not symptomatic, do not treat   ED Provider: Arthor Captain, PA-C   Lennie Muckle, PharmD PGY1 Pharmacy Resident 08/11/2023 9:34 AM

## 2023-08-11 NOTE — Telephone Encounter (Signed)
 Post ED Visit - Positive Culture Follow-up: Unsuccessful Patient Follow-up  Culture assessed and recommendations reviewed by:  [x]  Lennie Muckle, Pharm.D. []  Celedonio Miyamoto, Pharm.D., BCPS AQ-ID []  Garvin Fila, Pharm.D., BCPS []  Georgina Pillion, Pharm.D., BCPS []  [place], [place].D., BCPS, AAHIVP []  Estella Husk, Pharm.D., BCPS, AAHIVP []  Sherlynn Carbon, PharmD []  Pollyann Samples, PharmD, BCPS  Positive urine culture  [x]  Patient discharged without antimicrobial prescription and treatment is now indicated []  Organism is resistant to prescribed ED discharge antimicrobial []  Patient with positive blood cultures   Patient report not feeling better and became frustrated when I asked him to explain to me his symptoms. Patient stating he has more important things to than to sit in the Emergency room, He stated he wasn't feeling well and needed to rest. I explained that way he may need to come to the ED. The then said lady please leave me alone and hung up phone. Attempted to call patient back no answer. Letter mailed for him to contact out office.  Nena Polio Garner Nash 08/11/2023, 11:28 AM

## 2023-08-11 NOTE — Progress Notes (Signed)
 Patient ID: Martin [name], male   DOB: [birth year], 58 y.o.   MRN: [id number]  New complaint: Nasal abscess  HPI: The patient is a 58 year old male who returns today with a new complaint of nasal abscess.  The patient was previously seen for his recurrent right neck abscesses.  He was previously treated with multiple incision and drainage procedures.  He was treated with both oral and IV antibiotics.  At his last visit in January 2025.  His right neck abscess site has healed.  His previous infections were noted to be a result of MRSA.  According to the patient, he was doing well until 5 days ago, when he started noticing increasing nasal swelling and pain.  He was seen by his primary care physician, and the nasal infection was drained and cultured.  The bacteria was noted to be resistant to clindamycin, Bactrim, and penicillin.  It was sensitive to cefadroxil and gentamicin.  The patient is currently on Bactroban ointment and cefadroxil.  Exam: General: Communicates without difficulty, well nourished, no acute distress. Head: Normocephalic, no evidence injury, no tenderness, facial buttresses intact without stepoff. Face/sinus: No tenderness to palpation and percussion. Facial movement is normal and symmetric. Eyes: PERRL, EOMI. No scleral icterus, conjunctivae clear. Neuro: CN II exam reveals vision grossly intact.  No nystagmus at any point of gaze. Ears: Auricles well formed without lesions.  Ear canals are intact without mass or lesion.  No erythema or edema is appreciated.  The TMs are intact without fluid. Nose: Severe edema and erythema are noted over the nasal tip.  An area of necrotic tissue is noted on the nasal tip.  Nasal mucosal congestion is noted.  Oral:  Oral cavity and oropharynx are intact, symmetric, without erythema or edema.  Mucosa is moist without lesions. Neck: Full range of motion without pain.  The right posterior neck I&D site has healed.  Thyroid bed within normal limits to  palpation.  Parotid glands and submandibular glands equal bilaterally without mass.  Trachea is midline. Neuro:  CN 2-12 grossly intact.   Assessment: 1.  Nasal abscess, likely secondary to recurrent MRSA infection. 2.  The patient has a history of frequent recurrent infections.  He was recently treated for 2 deep neck infections.  The infections were all a result of MRSA. 3.  Poorly controlled diabetes.  Her A1c last year was 8-12.  The patient is likely immunocompromised.  Plan: 1.  The physical exam findings are reviewed with the patient. 2.  I had an extensive discussion with the patient and his wife regarding his recurrent infections.  It is explained to the patient that his frequent infection is likely a result of his poorly controlled diabetes and immunocompromised status. 3.  The patient is strongly encouraged to follow-up with his endocrinologist to better control his blood sugar.  He should consider the use of a GLP-1 medication. 4.  The patient should complete his current course of cefadroxil and Bactroban ointment. 5.  The patient is encouraged to call with any questions or concerns.

## 2023-08-14 ENCOUNTER — Other Ambulatory Visit (HOSPITAL_COMMUNITY): Payer: Self-pay

## 2023-08-14 DIAGNOSIS — N401 Enlarged prostate with lower urinary tract symptoms: Secondary | ICD-10-CM | POA: Diagnosis not present

## 2023-08-14 DIAGNOSIS — R3914 Feeling of incomplete bladder emptying: Secondary | ICD-10-CM | POA: Diagnosis not present

## 2023-08-14 DIAGNOSIS — R3916 Straining to void: Secondary | ICD-10-CM | POA: Diagnosis not present

## 2023-08-14 DIAGNOSIS — N130 Hydronephrosis with ureteropelvic junction obstruction: Secondary | ICD-10-CM | POA: Diagnosis not present

## 2023-08-14 DIAGNOSIS — N453 Epididymo-orchitis: Secondary | ICD-10-CM | POA: Diagnosis not present

## 2023-08-14 DIAGNOSIS — N3000 Acute cystitis without hematuria: Secondary | ICD-10-CM | POA: Diagnosis not present

## 2023-08-14 MED ORDER — SULFAMETHOXAZOLE-TRIMETHOPRIM 800-160 MG PO TABS
ORAL_TABLET | ORAL | 0 refills | Status: DC
Start: 2023-08-14 — End: 2023-09-29
  Filled 2023-08-14 – 2023-08-15 (×4): qty 28, 14d supply, fill #0

## 2023-08-14 MED ORDER — TAMSULOSIN HCL 0.4 MG PO CAPS
ORAL_CAPSULE | ORAL | 11 refills | Status: DC
  Filled 2023-08-14 (×2): qty 30, 30d supply, fill #0
  Filled 2023-09-25: qty 30, 30d supply, fill #1

## 2023-08-14 MED ORDER — FINASTERIDE 5 MG PO TABS
ORAL_TABLET | ORAL | 3 refills | Status: AC
Start: 2023-08-14 — End: ?
  Filled 2023-08-14: qty 30, 30d supply, fill #0
  Filled 2023-08-14: qty 90, 90d supply, fill #0
  Filled 2023-11-20: qty 90, 90d supply, fill #1
  Filled 2024-01-08: qty 90, 90d supply, fill #0
  Filled 2024-01-08: qty 90, 90d supply, fill #1

## 2023-08-15 ENCOUNTER — Other Ambulatory Visit (HOSPITAL_COMMUNITY): Payer: Self-pay

## 2023-08-15 ENCOUNTER — Ambulatory Visit: Payer: Commercial Managed Care - PPO | Admitting: Podiatry

## 2023-08-18 ENCOUNTER — Other Ambulatory Visit (HOSPITAL_COMMUNITY): Payer: Self-pay

## 2023-08-18 DIAGNOSIS — M1712 Unilateral primary osteoarthritis, left knee: Secondary | ICD-10-CM | POA: Diagnosis not present

## 2023-08-18 MED ORDER — COLCHICINE 0.6 MG PO TABS
0.6000 mg | ORAL_TABLET | Freq: Three times a day (TID) | ORAL | 0 refills | Status: DC
  Filled 2023-08-18: qty 21, 7d supply, fill #0

## 2023-08-21 DIAGNOSIS — N130 Hydronephrosis with ureteropelvic junction obstruction: Secondary | ICD-10-CM | POA: Diagnosis not present

## 2023-08-21 DIAGNOSIS — N453 Epididymo-orchitis: Secondary | ICD-10-CM | POA: Diagnosis not present

## 2023-08-21 DIAGNOSIS — R3914 Feeling of incomplete bladder emptying: Secondary | ICD-10-CM | POA: Diagnosis not present

## 2023-08-21 DIAGNOSIS — N3000 Acute cystitis without hematuria: Secondary | ICD-10-CM | POA: Diagnosis not present

## 2023-08-21 DIAGNOSIS — N401 Enlarged prostate with lower urinary tract symptoms: Secondary | ICD-10-CM | POA: Diagnosis not present

## 2023-08-24 ENCOUNTER — Ambulatory Visit: Admitting: Podiatry

## 2023-08-24 ENCOUNTER — Other Ambulatory Visit (HOSPITAL_COMMUNITY): Payer: Self-pay

## 2023-08-31 ENCOUNTER — Other Ambulatory Visit (HOSPITAL_COMMUNITY): Payer: Self-pay

## 2023-08-31 ENCOUNTER — Other Ambulatory Visit: Payer: Self-pay

## 2023-09-08 ENCOUNTER — Ambulatory Visit: Admitting: Podiatry

## 2023-09-26 ENCOUNTER — Other Ambulatory Visit (HOSPITAL_COMMUNITY): Payer: Self-pay

## 2023-09-27 DIAGNOSIS — Z23 Encounter for immunization: Secondary | ICD-10-CM | POA: Diagnosis not present

## 2023-09-27 DIAGNOSIS — Z87440 Personal history of urinary (tract) infections: Secondary | ICD-10-CM | POA: Diagnosis not present

## 2023-09-27 DIAGNOSIS — K5900 Constipation, unspecified: Secondary | ICD-10-CM | POA: Diagnosis not present

## 2023-09-27 DIAGNOSIS — T148XXA Other injury of unspecified body region, initial encounter: Secondary | ICD-10-CM | POA: Diagnosis not present

## 2023-09-28 ENCOUNTER — Other Ambulatory Visit (HOSPITAL_COMMUNITY): Payer: Self-pay

## 2023-09-29 ENCOUNTER — Other Ambulatory Visit (HOSPITAL_COMMUNITY): Payer: Self-pay

## 2023-09-29 MED ORDER — SULFAMETHOXAZOLE-TRIMETHOPRIM 800-160 MG PO TABS
1.0000 | ORAL_TABLET | Freq: Two times a day (BID) | ORAL | 0 refills | Status: DC
  Filled 2023-09-29: qty 28, 14d supply, fill #0

## 2023-10-02 ENCOUNTER — Other Ambulatory Visit (HOSPITAL_COMMUNITY): Payer: Self-pay

## 2023-10-23 ENCOUNTER — Other Ambulatory Visit (HOSPITAL_COMMUNITY): Payer: Self-pay

## 2023-10-23 MED ORDER — JARDIANCE 25 MG PO TABS
25.0000 mg | ORAL_TABLET | Freq: Every day | ORAL | 3 refills | Status: AC
  Filled 2023-10-23: qty 90, 90d supply, fill #0

## 2023-10-27 ENCOUNTER — Other Ambulatory Visit (HOSPITAL_COMMUNITY): Payer: Self-pay

## 2023-11-10 DIAGNOSIS — A084 Viral intestinal infection, unspecified: Secondary | ICD-10-CM | POA: Diagnosis not present

## 2023-11-15 ENCOUNTER — Other Ambulatory Visit (HOSPITAL_COMMUNITY): Payer: Self-pay

## 2023-11-15 DIAGNOSIS — E1165 Type 2 diabetes mellitus with hyperglycemia: Secondary | ICD-10-CM | POA: Diagnosis not present

## 2023-11-15 DIAGNOSIS — R319 Hematuria, unspecified: Secondary | ICD-10-CM | POA: Diagnosis not present

## 2023-11-15 DIAGNOSIS — N390 Urinary tract infection, site not specified: Secondary | ICD-10-CM | POA: Diagnosis not present

## 2023-11-15 DIAGNOSIS — N433 Hydrocele, unspecified: Secondary | ICD-10-CM | POA: Diagnosis not present

## 2023-11-15 DIAGNOSIS — N5089 Other specified disorders of the male genital organs: Secondary | ICD-10-CM | POA: Diagnosis not present

## 2023-11-15 DIAGNOSIS — N453 Epididymo-orchitis: Secondary | ICD-10-CM | POA: Diagnosis not present

## 2023-11-15 DIAGNOSIS — Z794 Long term (current) use of insulin: Secondary | ICD-10-CM | POA: Diagnosis not present

## 2023-11-15 MED ORDER — NITROFURANTOIN MONOHYD MACRO 100 MG PO CAPS
100.0000 mg | ORAL_CAPSULE | Freq: Two times a day (BID) | ORAL | 0 refills | Status: DC
  Filled 2023-11-15: qty 14, 7d supply, fill #0

## 2023-11-20 ENCOUNTER — Other Ambulatory Visit (HOSPITAL_COMMUNITY): Payer: Self-pay

## 2023-11-20 MED ORDER — SULFAMETHOXAZOLE-TRIMETHOPRIM 800-160 MG PO TABS
1.0000 | ORAL_TABLET | Freq: Two times a day (BID) | ORAL | 0 refills | Status: DC
  Filled 2023-11-20: qty 28, 14d supply, fill #0

## 2023-11-21 ENCOUNTER — Other Ambulatory Visit (HOSPITAL_COMMUNITY): Payer: Self-pay

## 2023-11-21 DIAGNOSIS — N453 Epididymo-orchitis: Secondary | ICD-10-CM | POA: Diagnosis not present

## 2023-11-21 DIAGNOSIS — R3914 Feeling of incomplete bladder emptying: Secondary | ICD-10-CM | POA: Diagnosis not present

## 2023-11-21 MED ORDER — TRAMADOL HCL 50 MG PO TABS
ORAL_TABLET | ORAL | 0 refills | Status: DC
  Filled 2023-11-21 (×2): qty 10, 2d supply, fill #0
  Filled 2023-11-21: qty 10, 3d supply, fill #0

## 2023-11-21 MED ORDER — TRAMADOL HCL 50 MG PO TABS
50.0000 mg | ORAL_TABLET | Freq: Four times a day (QID) | ORAL | 0 refills | Status: DC | PRN
  Filled 2023-11-21: qty 10, 3d supply, fill #0

## 2023-11-21 MED ORDER — TAMSULOSIN HCL 0.4 MG PO CAPS
ORAL_CAPSULE | ORAL | 0 refills | Status: DC
  Filled 2023-11-21 (×2): qty 60, 30d supply, fill #0

## 2023-11-21 MED ORDER — TAMSULOSIN HCL 0.4 MG PO CAPS
0.8000 mg | ORAL_CAPSULE | Freq: Every day | ORAL | 0 refills | Status: DC
  Filled 2023-11-21: qty 60, 60d supply, fill #0

## 2023-12-01 ENCOUNTER — Emergency Department (HOSPITAL_BASED_OUTPATIENT_CLINIC_OR_DEPARTMENT_OTHER)

## 2023-12-01 ENCOUNTER — Other Ambulatory Visit: Payer: Self-pay

## 2023-12-01 ENCOUNTER — Encounter (HOSPITAL_BASED_OUTPATIENT_CLINIC_OR_DEPARTMENT_OTHER): Payer: Self-pay

## 2023-12-01 ENCOUNTER — Inpatient Hospital Stay (HOSPITAL_BASED_OUTPATIENT_CLINIC_OR_DEPARTMENT_OTHER)
Admission: EM | Admit: 2023-12-01 | Discharge: 2023-12-06 | DRG: 713 | Disposition: A | Discharge status: Home Health | Attending: Family Medicine | Admitting: Family Medicine

## 2023-12-01 DIAGNOSIS — E871 Hypo-osmolality and hyponatremia: Secondary | ICD-10-CM | POA: Diagnosis present

## 2023-12-01 DIAGNOSIS — Z89412 Acquired absence of left great toe: Secondary | ICD-10-CM

## 2023-12-01 DIAGNOSIS — E119 Type 2 diabetes mellitus without complications: Secondary | ICD-10-CM

## 2023-12-01 DIAGNOSIS — Z87442 Personal history of urinary calculi: Secondary | ICD-10-CM | POA: Diagnosis not present

## 2023-12-01 DIAGNOSIS — N412 Abscess of prostate: Principal | ICD-10-CM | POA: Diagnosis present

## 2023-12-01 DIAGNOSIS — Z79899 Other long term (current) drug therapy: Secondary | ICD-10-CM | POA: Diagnosis not present

## 2023-12-01 DIAGNOSIS — N5082 Scrotal pain: Secondary | ICD-10-CM | POA: Diagnosis not present

## 2023-12-01 DIAGNOSIS — I10 Essential (primary) hypertension: Secondary | ICD-10-CM | POA: Diagnosis not present

## 2023-12-01 DIAGNOSIS — I700 Atherosclerosis of aorta: Secondary | ICD-10-CM | POA: Diagnosis not present

## 2023-12-01 DIAGNOSIS — Z89411 Acquired absence of right great toe: Secondary | ICD-10-CM | POA: Diagnosis not present

## 2023-12-01 DIAGNOSIS — N401 Enlarged prostate with lower urinary tract symptoms: Secondary | ICD-10-CM | POA: Diagnosis present

## 2023-12-01 DIAGNOSIS — E669 Obesity, unspecified: Secondary | ICD-10-CM | POA: Diagnosis present

## 2023-12-01 DIAGNOSIS — F17220 Nicotine dependence, chewing tobacco, uncomplicated: Secondary | ICD-10-CM | POA: Diagnosis present

## 2023-12-01 DIAGNOSIS — B9562 Methicillin resistant Staphylococcus aureus infection as the cause of diseases classified elsewhere: Secondary | ICD-10-CM | POA: Diagnosis not present

## 2023-12-01 DIAGNOSIS — M1A0620 Idiopathic chronic gout, left knee, without tophus (tophi): Secondary | ICD-10-CM | POA: Diagnosis not present

## 2023-12-01 DIAGNOSIS — Z7984 Long term (current) use of oral hypoglycemic drugs: Secondary | ICD-10-CM

## 2023-12-01 DIAGNOSIS — D72829 Elevated white blood cell count, unspecified: Secondary | ICD-10-CM | POA: Diagnosis not present

## 2023-12-01 DIAGNOSIS — N433 Hydrocele, unspecified: Secondary | ICD-10-CM | POA: Diagnosis present

## 2023-12-01 DIAGNOSIS — K4020 Bilateral inguinal hernia, without obstruction or gangrene, not specified as recurrent: Secondary | ICD-10-CM | POA: Diagnosis not present

## 2023-12-01 DIAGNOSIS — M1A0610 Idiopathic chronic gout, right knee, without tophus (tophi): Secondary | ICD-10-CM | POA: Diagnosis not present

## 2023-12-01 DIAGNOSIS — R739 Hyperglycemia, unspecified: Secondary | ICD-10-CM | POA: Diagnosis not present

## 2023-12-01 DIAGNOSIS — M898X8 Other specified disorders of bone, other site: Secondary | ICD-10-CM | POA: Diagnosis not present

## 2023-12-01 DIAGNOSIS — E1165 Type 2 diabetes mellitus with hyperglycemia: Secondary | ICD-10-CM | POA: Diagnosis present

## 2023-12-01 DIAGNOSIS — Z6830 Body mass index (BMI) 30.0-30.9, adult: Secondary | ICD-10-CM | POA: Diagnosis not present

## 2023-12-01 DIAGNOSIS — N451 Epididymitis: Secondary | ICD-10-CM | POA: Diagnosis not present

## 2023-12-01 DIAGNOSIS — R339 Retention of urine, unspecified: Secondary | ICD-10-CM | POA: Diagnosis not present

## 2023-12-01 DIAGNOSIS — N419 Inflammatory disease of prostate, unspecified: Secondary | ICD-10-CM | POA: Diagnosis not present

## 2023-12-01 DIAGNOSIS — E785 Hyperlipidemia, unspecified: Secondary | ICD-10-CM | POA: Diagnosis present

## 2023-12-01 DIAGNOSIS — M109 Gout, unspecified: Secondary | ICD-10-CM | POA: Diagnosis present

## 2023-12-01 DIAGNOSIS — Z794 Long term (current) use of insulin: Secondary | ICD-10-CM

## 2023-12-01 DIAGNOSIS — N138 Other obstructive and reflux uropathy: Secondary | ICD-10-CM | POA: Diagnosis present

## 2023-12-01 DIAGNOSIS — R590 Localized enlarged lymph nodes: Secondary | ICD-10-CM | POA: Diagnosis not present

## 2023-12-01 DIAGNOSIS — R338 Other retention of urine: Secondary | ICD-10-CM | POA: Diagnosis not present

## 2023-12-01 DIAGNOSIS — N453 Epididymo-orchitis: Secondary | ICD-10-CM | POA: Diagnosis not present

## 2023-12-01 DIAGNOSIS — N454 Abscess of epididymis or testis: Secondary | ICD-10-CM | POA: Diagnosis present

## 2023-12-01 DIAGNOSIS — M25461 Effusion, right knee: Secondary | ICD-10-CM | POA: Diagnosis not present

## 2023-12-01 DIAGNOSIS — N5089 Other specified disorders of the male genital organs: Secondary | ICD-10-CM | POA: Diagnosis not present

## 2023-12-01 LAB — URINALYSIS, ROUTINE W REFLEX MICROSCOPIC
Bilirubin Urine: NEGATIVE
Glucose, UA: >=500 mg/dL — AB
Hgb urine dipstick: NEGATIVE
Ketones, ur: NEGATIVE mg/dL
Nitrite: NEGATIVE
Protein, ur: NEGATIVE mg/dL
Specific Gravity, Urine: 1.01 (ref 1.005–1.030)
pH: 5.5 (ref 5.0–8.0)

## 2023-12-01 LAB — URINALYSIS, MICROSCOPIC (REFLEX)

## 2023-12-01 LAB — COMPREHENSIVE METABOLIC PANEL WITH GFR
ALT: 11 U/L (ref 0–44)
AST: 16 U/L (ref 15–41)
Albumin: 3.3 g/dL — ABNORMAL LOW (ref 3.5–5.0)
Alkaline Phosphatase: 241 U/L — ABNORMAL HIGH (ref 38–126)
Anion gap: 13 (ref 5–15)
BUN: 13 mg/dL (ref 6–20)
CO2: 22 mmol/L (ref 22–32)
Calcium: 9.6 mg/dL (ref 8.9–10.3)
Chloride: 99 mmol/L (ref 98–111)
Creatinine, Ser: 0.74 mg/dL (ref 0.61–1.24)
GFR, Estimated: >60 mL/min (ref >60)
Glucose, Bld: 163 mg/dL — ABNORMAL HIGH (ref 70–99)
Potassium: 4 mmol/L (ref 3.5–5.1)
Sodium: 134 mmol/L — ABNORMAL LOW (ref 135–145)
Total Bilirubin: 0.3 mg/dL (ref 0.0–1.2)
Total Protein: 9.2 g/dL — ABNORMAL HIGH (ref 6.5–8.1)

## 2023-12-01 LAB — CBC WITH DIFFERENTIAL/PLATELET
Abs Immature Granulocytes: 0.14 10*3/uL — ABNORMAL HIGH (ref 0.00–0.07)
Basophils Absolute: 0 10*3/uL (ref 0.0–0.1)
Basophils Relative: 0 %
Eosinophils Absolute: 0.5 10*3/uL (ref 0.0–0.5)
Eosinophils Relative: 3 %
HCT: 36.3 % — ABNORMAL LOW (ref 39.0–52.0)
Hemoglobin: 12 g/dL — ABNORMAL LOW (ref 13.0–17.0)
Immature Granulocytes: 1 %
Lymphocytes Relative: 9 %
Lymphs Abs: 1.6 10*3/uL (ref 0.7–4.0)
MCH: 27.3 pg (ref 26.0–34.0)
MCHC: 33.1 g/dL (ref 30.0–36.0)
MCV: 82.7 fL (ref 80.0–100.0)
Monocytes Absolute: 1.3 10*3/uL — ABNORMAL HIGH (ref 0.1–1.0)
Monocytes Relative: 7 %
Neutro Abs: 13.8 10*3/uL — ABNORMAL HIGH (ref 1.7–7.7)
Neutrophils Relative %: 80 %
Platelets: 473 10*3/uL — ABNORMAL HIGH (ref 150–400)
RBC: 4.39 MIL/uL (ref 4.22–5.81)
RDW: 13.5 % (ref 11.5–15.5)
WBC: 17.4 10*3/uL — ABNORMAL HIGH (ref 4.0–10.5)
nRBC: 0 % (ref 0.0–0.2)

## 2023-12-01 LAB — HEMOGLOBIN A1C
Hgb A1c MFr Bld: 10.9 % — ABNORMAL HIGH (ref 4.8–5.6)
Mean Plasma Glucose: 266.13 mg/dL

## 2023-12-01 LAB — HIV ANTIBODY (ROUTINE TESTING W REFLEX): HIV Screen 4th Generation wRfx: NONREACTIVE

## 2023-12-01 MED ORDER — IOHEXOL 300 MG/ML  SOLN
100.0000 mL | Freq: Once | INTRAMUSCULAR | Status: AC | PRN
  Administered 2023-12-01: 100 mL via INTRAVENOUS

## 2023-12-01 MED ORDER — LIDOCAINE HCL URETHRAL/MUCOSAL 2 % EX GEL
1.0000 | Freq: Once | CUTANEOUS | Status: AC
  Administered 2023-12-01: 1 via URETHRAL
  Filled 2023-12-01: qty 11

## 2023-12-01 MED ORDER — NALOXONE HCL 0.4 MG/ML IJ SOLN: 0.4000 mg | INTRAMUSCULAR | Status: DC | PRN

## 2023-12-01 MED ORDER — OXYCODONE-ACETAMINOPHEN 5-325 MG PO TABS: 1.0000 | ORAL_TABLET | Freq: Once | ORAL | Status: DC

## 2023-12-01 MED ORDER — INSULIN ASPART 100 UNIT/ML IJ SOLN
0.0000 [IU] | Freq: Three times a day (TID) | INTRAMUSCULAR | Status: DC
  Administered 2023-12-02: 3 [IU] via SUBCUTANEOUS
  Administered 2023-12-03: 5 [IU] via SUBCUTANEOUS
  Administered 2023-12-03 (×2): 3 [IU] via SUBCUTANEOUS
  Administered 2023-12-04: 5 [IU] via SUBCUTANEOUS
  Administered 2023-12-04 – 2023-12-05 (×2): 2 [IU] via SUBCUTANEOUS
  Administered 2023-12-05: 3 [IU] via SUBCUTANEOUS
  Administered 2023-12-06 (×2): 5 [IU] via SUBCUTANEOUS

## 2023-12-01 MED ORDER — GENTAMICIN SULFATE 40 MG/ML IJ SOLN: 2.0000 mg/kg | Freq: Once | INTRAVENOUS | Status: DC

## 2023-12-01 MED ORDER — FINASTERIDE 5 MG PO TABS
5.0000 mg | ORAL_TABLET | Freq: Every day | ORAL | Status: DC
Start: 2023-12-02 — End: 2023-12-06
  Administered 2023-12-03 – 2023-12-06 (×4): 5 mg via ORAL
  Filled 2023-12-01 (×5): qty 1

## 2023-12-01 MED ORDER — FENTANYL CITRATE PF 50 MCG/ML IJ SOSY
50.0000 ug | PREFILLED_SYRINGE | Freq: Once | INTRAMUSCULAR | Status: AC
  Administered 2023-12-01: 50 ug via INTRAVENOUS
  Filled 2023-12-01: qty 1

## 2023-12-01 MED ORDER — KETOROLAC TROMETHAMINE 15 MG/ML IJ SOLN
15.0000 mg | Freq: Once | INTRAMUSCULAR | Status: AC
  Administered 2023-12-01: 15 mg via INTRAVENOUS
  Filled 2023-12-01: qty 1

## 2023-12-01 MED ORDER — LISINOPRIL 10 MG PO TABS
10.0000 mg | ORAL_TABLET | Freq: Every day | ORAL | Status: DC
  Administered 2023-12-03: 10 mg via ORAL
  Filled 2023-12-01: qty 1

## 2023-12-01 MED ORDER — INSULIN GLARGINE-YFGN 100 UNIT/ML ~~LOC~~ SOLN
80.0000 [IU] | Freq: Every day | SUBCUTANEOUS | Status: DC
  Filled 2023-12-01: qty 0.8

## 2023-12-01 MED ORDER — GENTAMICIN SULFATE 40 MG/ML IJ SOLN: 7.0000 mg/kg | Freq: Once | INTRAVENOUS | Status: DC

## 2023-12-01 MED ORDER — LACTATED RINGERS IV BOLUS
1000.0000 mL | Freq: Once | INTRAVENOUS | Status: AC
  Administered 2023-12-01: 1000 mL via INTRAVENOUS

## 2023-12-01 MED ORDER — LIDOCAINE 4 % EX CREA
TOPICAL_CREAM | Freq: Once | CUTANEOUS | Status: AC
  Administered 2023-12-01: 1 via TOPICAL
  Filled 2023-12-01: qty 5

## 2023-12-01 MED ORDER — VANCOMYCIN HCL IN DEXTROSE 1-5 GM/200ML-% IV SOLN
1000.0000 mg | Freq: Once | INTRAVENOUS | Status: AC
  Administered 2023-12-01: 1000 mg via INTRAVENOUS
  Filled 2023-12-01: qty 200

## 2023-12-01 MED ORDER — SODIUM CHLORIDE 0.9 % IV SOLN: INTRAVENOUS | Status: AC

## 2023-12-01 MED ORDER — TAMSULOSIN HCL 0.4 MG PO CAPS
0.4000 mg | ORAL_CAPSULE | Freq: Every day | ORAL | Status: DC
  Administered 2023-12-03 – 2023-12-06 (×4): 0.4 mg via ORAL
  Filled 2023-12-01 (×5): qty 1

## 2023-12-01 MED ORDER — ATORVASTATIN CALCIUM 40 MG PO TABS
80.0000 mg | ORAL_TABLET | Freq: Every day | ORAL | Status: DC
  Administered 2023-12-01 – 2023-12-05 (×5): 80 mg via ORAL
  Filled 2023-12-01 (×5): qty 2

## 2023-12-01 MED ORDER — FENTANYL CITRATE PF 50 MCG/ML IJ SOSY
50.0000 ug | PREFILLED_SYRINGE | INTRAMUSCULAR | Status: DC | PRN
  Administered 2023-12-01 – 2023-12-03 (×14): 50 ug via INTRAVENOUS
  Filled 2023-12-01 (×14): qty 1

## 2023-12-01 MED ORDER — PIPERACILLIN-TAZOBACTAM 3.375 G IVPB
3.3750 g | Freq: Three times a day (TID) | INTRAVENOUS | Status: DC
  Administered 2023-12-01 – 2023-12-04 (×8): 3.375 g via INTRAVENOUS
  Filled 2023-12-01 (×8): qty 50

## 2023-12-01 MED ORDER — VANCOMYCIN HCL 2000 MG/400ML IV SOLN: 2000.0000 mg | Freq: Once | INTRAVENOUS | Status: DC

## 2023-12-01 MED ORDER — PIPERACILLIN-TAZOBACTAM 3.375 G IVPB 30 MIN
3.3750 g | Freq: Once | INTRAVENOUS | Status: AC
  Administered 2023-12-01: 3.375 g via INTRAVENOUS
  Filled 2023-12-01: qty 50

## 2023-12-01 MED ORDER — HYDRALAZINE HCL 20 MG/ML IJ SOLN
10.0000 mg | Freq: Four times a day (QID) | INTRAMUSCULAR | Status: DC | PRN
  Administered 2023-12-02: 10 mg via INTRAVENOUS
  Filled 2023-12-01: qty 1

## 2023-12-01 NOTE — ED Notes (Signed)
 Pt upset at the idea of having a foley catheter placed. Pt's CT scan showed distended bladder - bladder scan showed 800 mL. Pt unable to void at this time. PA, Alan Harari at bedside to educate pt on need for catheter.   Aleck FORBES Molt, RN

## 2023-12-01 NOTE — Consult Note (Signed)
 Reason for Consult: Large Prostate Abscess / Non-Compliant Diabetic  Referring Physician: Alan Harari PA  Martin [name] is an 58 y.o. male.   HPI:   1 - Large Prostate Abscess / Bilateral Epidiymorchitis / Non-Compliant Diabetic - diabetic with A1c 11's and prior amputations with huge prostate abscess (R>L) on CT 11/2023 on eval fevers and perineal pain. Scrotal US  also with bilateral epidiymorchitis. WBC 17k.  UCX 08/2023 MRSA sens vanc, gent.   2 - Urinary Retention - massively distended bladder on CT 2025 form prostate abscess. Cr 0.74. Foley placed in ER.   PMH sig for obesity, DM2 (A1c 11s, follows endocrine Martin [name]), umbilical hernia repair / mesh. No ischemic CV disease /blood thinners. Wife Martin [name] is OR nurse at [employer]. His PCP is with [facility].  Today Martin [name] is seen as emergent consultation for above. Transferred from [facility].   Past Medical History:  Diagnosis Date   Arthritis    Chest pain    Diabetes mellitus    Gout    History of kidney stones    Hyperlipidemia    Hypertension    Obesity     Past Surgical History:  Procedure Laterality Date   AMPUTATION TOE Right 01/23/2021   Procedure: AMPUTATION OF HALLUX;  Surgeon: Martin [name], DPM;  Location: [facility];  Service: Podiatry;  Laterality: Right;   AMPUTATION TOE Left 07/29/2022   Procedure: AMPUTATION TOE FIRST;  Surgeon: Martin [name], DPM;  Location: [facility];  Service: Podiatry;  Laterality: Left;   UMBILICAL HERNIA REPAIR      Family History  Problem Relation Age of Onset   Diabetes Maternal Grandfather    Kidney disease Other     Social History:  reports that he has never smoked. His smokeless tobacco use includes chew. He reports current alcohol use. He reports that he does not use drugs.  Allergies: No Known Allergies  Medications: I have reviewed the patient's current medications.  Results for orders placed or performed during the hospital encounter of 12/01/23 (from the  past 48 hours)  Urinalysis, Routine w reflex microscopic -Urine, Clean Catch     Status: Abnormal   Collection Time: 12/01/23  1:04 PM  Result Value Ref Range   Color, Urine YELLOW YELLOW   APPearance CLEAR CLEAR   Specific Gravity, Urine 1.010 1.005 - 1.030   pH 5.5 5.0 - 8.0   Glucose, UA >=500 (A) NEGATIVE mg/dL   Hgb urine dipstick NEGATIVE NEGATIVE   Bilirubin Urine NEGATIVE NEGATIVE   Ketones, ur NEGATIVE NEGATIVE mg/dL   Protein, ur NEGATIVE NEGATIVE mg/dL   Nitrite NEGATIVE NEGATIVE   Leukocytes,Ua SMALL (A) NEGATIVE    Comment: Performed at [facility], [address] [place], [place] [address]  CBC with Differential     Status: Abnormal   Collection Time: 12/01/23  1:04 PM  Result Value Ref Range   WBC 17.4 (H) 4.0 - 10.5 K/uL   RBC 4.39 4.22 - 5.81 MIL/uL   Hemoglobin 12.0 (L) 13.0 - 17.0 g/dL   HCT 63.6 (L) 60.9 - 47.9 %   MCV 82.7 80.0 - 100.0 fL   MCH 27.3 26.0 - 34.0 pg   MCHC 33.1 30.0 - 36.0 g/dL   RDW 86.4 88.4 - 84.4 %   Platelets 473 (H) 150 - 400 K/uL   nRBC 0.0 0.0 - 0.2 %   Neutrophils Relative % 80 %   Neutro Abs 13.8 (H) 1.7 - 7.7 K/uL  Lymphocytes Relative 9 %   Lymphs Abs 1.6 0.7 - 4.0 K/uL   Monocytes Relative 7 %   Monocytes Absolute 1.3 (H) 0.1 - 1.0 K/uL   Eosinophils Relative 3 %   Eosinophils Absolute 0.5 0.0 - 0.5 K/uL   Basophils Relative 0 %   Basophils Absolute 0.0 0.0 - 0.1 K/uL   Immature Granulocytes 1 %   Abs Immature Granulocytes 0.14 (H) 0.00 - 0.07 K/uL    Comment: Performed at [facility], [address] [place], [place] [address]  Comprehensive metabolic panel     Status: Abnormal   Collection Time: 12/01/23  1:04 PM  Result Value Ref Range   Sodium 134 (L) 135 - 145 mmol/L   Potassium 4.0 3.5 - 5.1 mmol/L   Chloride 99 98 - 111 mmol/L   CO2 22 22 - 32 mmol/L   Glucose, Bld 163 (H) 70 - 99 mg/dL    Comment: Glucose reference range applies only to samples taken after fasting for at least 8 hours.    BUN 13 6 - 20 mg/dL   Creatinine, Ser 9.25 0.61 - 1.24 mg/dL   Calcium  9.6 8.9 - 10.3 mg/dL   Total Protein 9.2 (H) 6.5 - 8.1 g/dL   Albumin 3.3 (L) 3.5 - 5.0 g/dL   AST 16 15 - 41 U/L   ALT 11 0 - 44 U/L   Alkaline Phosphatase 241 (H) 38 - 126 U/L   Total Bilirubin 0.3 0.0 - 1.2 mg/dL   GFR, Estimated >39 >39 mL/min    Comment: (NOTE) Calculated using the CKD-EPI Creatinine Equation (2021)    Anion gap 13 5 - 15    Comment: Performed at [facility], [address] [place], [place] [address]  Urinalysis, Microscopic (reflex)     Status: Abnormal   Collection Time: 12/01/23  1:04 PM  Result Value Ref Range   RBC / HPF 0-5 0 - 5 RBC/hpf   WBC, UA 11-20 0 - 5 WBC/hpf   Bacteria, UA MANY (A) NONE SEEN   Squamous Epithelial / HPF 0-5 0 - 5 /HPF    Comment: Performed at [facility], [address] [place], [place] [address]    CT PELVIS W CONTRAST Result Date: 12/01/2023 CLINICAL DATA:  Hemorrhoids and rectal pain for the past 2 weeks. Clinical concern for possible prostate abscess. Left testicle swelling and pain. Started on antibiotics 1 week ago. EXAM: CT PELVIS WITH CONTRAST TECHNIQUE: Multidetector CT imaging of the pelvis was performed using the standard protocol following the bolus administration of intravenous contrast. RADIATION DOSE REDUCTION: This exam was performed according to the departmental dose-optimization program which includes automated exposure control, adjustment of the mA and/or kV according to patient size and/or use of iterative reconstruction technique. CONTRAST:  OMNIPAQUE  IOHEXOL  300 MG/ML  SOLN COMPARISON:  08/07/2023. FINDINGS: Urinary Tract: Interval dilated bladder extending into the mid abdomen with mild diffuse wall thickening. Moderate dilatation of both distal ureters with mild progression. Bowel:  Unremarkable visualized pelvic bowel loops. Vascular/Lymphatic: The previously demonstrated 10 mm mildly enlarged left common  iliac lymph node is unchanged on image number 15/2. No other enlarged lymph nodes. No significant vascular abnormalities. Reproductive: Interval significant increase in size of the prostate gland, containing multiple loculated fluid collections with thin and moderately thickened surrounding walls occupying the majority of the prostate gland. The largest of these is located on the right, measuring 7.7 x 3.9 cm on image  number 64/2. Other: Stable postsurgical scarring in the anterior abdominal wall and subcutaneous tissues at the level of the umbilicus. Small bilateral inguinal hernias containing fat. Musculoskeletal: Stable probable bone island in the anterior right sacral ala and lumbosacral spine degenerative changes. IMPRESSION: 1. Interval significant increase in size of the prostate gland, containing multiple loculated fluid collections with thin and moderately thickened surrounding walls occupying the majority of the prostate gland. The largest of these is located on the right, measuring 7.7 x 3.9 cm. These are consistent with prostate abscesses. 2. Interval dilated bladder extending into the mid abdomen with mild diffuse wall thickening. This is consistent with bladder outlet obstruction by the markedly enlarged prostate gland. 3. Moderate dilatation of both distal ureters with mild progression. 4. Stable 10 mm mildly enlarged left common iliac lymph node, most likely reactive. 5. Small bilateral inguinal hernias containing fat. Electronically Signed   By: Elspeth Bathe M.D.   On: 12/01/2023 15:01   US  SCROTUM W/DOPPLER Result Date: 12/01/2023 CLINICAL DATA:  Left scrotal edema and pain EXAM: SCROTAL ULTRASOUND DOPPLER ULTRASOUND OF THE TESTICLES TECHNIQUE: Complete ultrasound examination of the testicles, epididymis, and other scrotal structures was performed. Color and spectral Doppler ultrasound were also utilized to evaluate blood flow to the testicles. COMPARISON:  None Available. FINDINGS: Right  testicle Measurements: 5.4 x 2.2 x 3.5 cm. Normal parenchymal echogenicity and echotexture. No intratesticular mass or calcification. Normal color flow vascularity. Left testicle Measurements: 4.9 x 2.9 x 2.8 cm. Normal parenchymal echogenicity and echotexture. No intratesticular mass or calcification identified. Diffusely increased parenchymal vascularity best appreciated on color flow imaging (image # 79). Right epididymis: Thickened and heterogeneous within the tail with diffusely increased vascularity in keeping with changes of acute epididymitis. Left epididymis: Diffusely thickened and heterogeneous with diffusely increased vascularity in keeping with changes of acute epididymitis. Hydrocele: A small complex right hydrocele is present containing extensive septa and internal debris possibly representing a hematocele or pyocele. Varicocele:  None visualized. Pulsed Doppler interrogation of both testes demonstrates normal low resistance arterial and venous waveforms bilaterally. IMPRESSION: 1. Acute right epididymitis. 2. Acute left epididymo-orchitis. 3. Small complex right hydrocele containing extensive septa and internal debris possibly representing a hematocele or pyocele. Electronically Signed   By: Dorethia Molt M.D.   On: 12/01/2023 14:23    Review of Systems  Constitutional:  Positive for fatigue.  Genitourinary:  Positive for difficulty urinating, dysuria, scrotal swelling, testicular pain and urgency.   Blood pressure (!) 162/72, pulse 87, temperature 97.6 F (36.4 C), temperature source Oral, resp. rate 18, weight 99.8 kg, SpO2 99%. Physical Exam Vitals reviewed.  Constitutional:      Comments: Pleasant, non-toxic. Wife at bedside.   HENT:     Head: Normocephalic.     Nose: Nose normal.   Eyes:     Pupils: Pupils are equal, round, and reactive to light.    Cardiovascular:     Rate and Rhythm: Normal rate.  Pulmonary:     Effort: Pulmonary effort is normal.  Abdominal:      Comments: Moderate truncal obesity.   Genitourinary:    Comments: Foley in place with medium yellow urine. Bilateral testes swellign and tenderness.   Musculoskeletal:        General: Normal range of motion.   Skin:    General: Skin is warm.   Neurological:     Mental Status: He is alert.     Assessment/Plan:  [place] discussed he has severe GU infections that are a  result of his hyperglycemia. Rec OR for TURP / Prostate Abscess unroofing, then medical admission for IV ABX and glycemic control.  Rec empiric Vanc + Zosn based on most recent priro CX's. Very likely all MRSA again.  Risksk, benefits, alternatives, expected peri-op course discussed as well as need for post-op catheter x several days at least.   Will proceed AM tomorrow as non-toxic, non-septic.   Ricardo KATHEE Alvaro Mickey. 12/01/2023, 4:09 PM

## 2023-12-01 NOTE — ED Notes (Signed)
 Foley catheter placed by this RN and Jenifer Schatz, NT. Clear yellow urine return. Foley clamped at 1,750 mL output. Made PA aware of large amount of output. She was okay to unclamp and empty out rest of urine. 2,350 mL total output. Pt endorses relief.   Aleck FORBES Molt, RN

## 2023-12-01 NOTE — Progress Notes (Signed)
 ED Pharmacy Antibiotic Sign Off An antibiotic consult was received from an ED provider for vancomycin  and zosyn per pharmacy dosing for prostate abscess. A chart review was completed to assess appropriateness.  The following one time order(s) were placed per pharmacy consult:  vancomycin  2000 mg x 1 dose zosyn 3.375 g x 1 dose  Further antibiotic and/or antibiotic pharmacy consults should be ordered by the admitting provider if indicated.   Thank you for allowing pharmacy to be a part of this patient's care.   Dorn Buttner, PharmD, BCPS 12/01/2023 5:43 PM ED Clinical Pharmacist -  [phone]

## 2023-12-01 NOTE — H&P (Signed)
 History and Physical    Martin [name] [name]:[id number] DOB: 1965-11-25 DOA: 12/01/2023  PCP: [name], PA-C Patient coming from: [facility] ED  I have personally briefly reviewed patient's old medical records in [facility] Link  Chief Complaint: rectal pain  HPI: Martin [name] is a 58 y.o. male with medical history significant of IDDM2, HTN presents to [facility] ED due to rectal pain for two weeks.   ED Course:   Data reviewed:  Blood pressure (!) 153/81, pulse 83, temperature 98.8 F (37.1 C), temperature source Oral, resp. rate 19, height 5' 11 (1.803 m), weight 98.6 kg, SpO2 97%.  Scrotal ultrasound with acute right epididymitis and acute left epididymoorchitis.  Small complex right hydrocele containing extensive septa and internal debris possibly representing a hematocele or pyocele CT pelvis with containing multiple loculated fluid collections with thin and moderately thickened surrounding walls occupying the majority of the prostate gland. The largest of these is located on the right, measuring 7.7 x 3.9 cm. These are consistent with prostate abscesses. 2. Interval dilated bladder extending into the mid abdomen with mild diffuse wall thickening. This is consistent with bladder outlet obstruction by the markedly enlarged prostate gland. 3. Moderate dilatation of both distal ureters with mild progression. 4. Stable 10 mm mildly enlarged left common iliac lymph node, most likely reactive. 5. Small bilateral inguinal hernias containing fat.  Labs Reviewed  URINALYSIS, ROUTINE W REFLEX MICROSCOPIC - Abnormal; Notable for the following components:      Result Value   Glucose, UA >=500 (*)    Leukocytes,Ua SMALL (*)    All other components within normal limits  CBC WITH DIFFERENTIAL/PLATELET - Abnormal; Notable for the following components:   WBC 17.4 (*)    Hemoglobin 12.0 (*)    HCT 36.3 (*)    Platelets 473 (*)    Neutro Abs 13.8 (*)    Monocytes Absolute  1.3 (*)    Abs Immature Granulocytes 0.14 (*)    All other components within normal limits  COMPREHENSIVE METABOLIC PANEL WITH GFR - Abnormal; Notable for the following components:   Sodium 134 (*)    Glucose, Bld 163 (*)    Total Protein 9.2 (*)    Albumin 3.3 (*)    Alkaline Phosphatase 241 (*)    All other components within normal limits  URINALYSIS, MICROSCOPIC (REFLEX) - Abnormal; Notable for the following components:   Bacteria, UA MANY (*)    All other components within normal limits  URINE CULTURE  CULTURE, BLOOD (ROUTINE X 2)  CULTURE, BLOOD (ROUTINE X 2)  HIV ANTIBODY (ROUTINE TESTING W REFLEX)  HEMOGLOBIN A1C   Foley placed at [facility] ED with 2liter urine drained He was given vanc and zosyn,  Urology Dr Alvaro consulted, patient is accepted to transfer to [facility] long.      Review of Systems: As per HPI otherwise all other systems reviewed and are negative.   Past Medical History:  Diagnosis Date   Arthritis    Chest pain    Diabetes mellitus    Gout    History of kidney stones    Hyperlipidemia    Hypertension    Obesity     Past Surgical History:  Procedure Laterality Date   AMPUTATION TOE Right 01/23/2021   Procedure: AMPUTATION OF HALLUX;  Surgeon: Gershon Donnice SAUNDERS, DPM;  Location: [facility] ORS;  Service: Podiatry;  Laterality: Right;   AMPUTATION TOE Left 07/29/2022   Procedure: AMPUTATION TOE FIRST;  Surgeon:  Gershon Donnice SAUNDERS, DPM;  Location: [facility] OR[facility];  Service: Podiatry;  Laterality: Left;   UMBILICAL HERNIA REPAIR      Social History  reports that he has never smoked. His smokeless tobacco use includes chew. He reports current alcohol use. He reports that he does not use drugs.  No Known Allergies  Family History  Problem Relation Age of Onset   Diabetes Maternal Grandfather    Kidney disease Other     Prior to Admission medications   Medication Sig Start Date End Date Taking? Authorizing Provider  atorvastatin  (LIPITOR) 80 MG tablet Take 1  tablet (80 mg total) by mouth at bedtime. 11/23/22     clindamycin  (CLEOCIN ) 300 MG capsule Take 1 capsule (300 mg total) by mouth every 6 (six) hours for 10 days. 08/03/23     Continuous Blood Gluc Sensor (DEXCOM G7 SENSOR) MISC Change 1 sensor every 10 days on insulin  3 times daily 01/13/22     Continuous Glucose Receiver (FREESTYLE LIBRE 3 READER) DEVI Use 4 (four) times daily. 11/23/22     Continuous Glucose Sensor (FREESTYLE LIBRE 3 SENSOR) MISC Change sensor every 14 days. 11/23/22     empagliflozin  (JARDIANCE ) 25 MG TABS tablet Take 1 tablet (25 mg total) by mouth daily. 03/29/22     empagliflozin  (JARDIANCE ) 25 MG TABS tablet Take 1 tablet (25 mg total) by mouth daily. 10/23/23     finasteride  (PROSCAR ) 5 MG tablet Take 1 (one) tablet by mouth daily. 08/14/23     gabapentin  (NEURONTIN ) 300 MG capsule Take 1 capsule (300 mg total) by mouth 3 (three) times daily. 10/20/22   Tobie Yetta HERO, MD  glucose blood (FREESTYLE LITE) test strip Use three times daily as directed 03/09/21     insulin  degludec (TRESIBA  FLEXTOUCH) 200 UNIT/ML FlexTouch Pen Inject 110 Units into the skin daily. 10/20/22   Tobie Yetta HERO, MD  insulin  degludec (TRESIBA  FLEXTOUCH) 200 UNIT/ML FlexTouch Pen Inject 100 Units into the skin daily. 08/03/23     Insulin  Pen Needle 32G X 6 MM MISC Use to inject insulin  2 times a day for 90 days. 12/24/21   [name] Purchase, MD  Insulin  Pen Needle 32G X 6 MM MISC Use to inject insulin  2 (two) times daily. 06/01/23     Lancets (FREESTYLE) lancets Use three times daily as directed 03/09/21   [name] Purchase, MD  lisinopril  (ZESTRIL ) 10 MG tablet Take 1 tablet (10 mg total) by mouth daily. 11/23/22     meloxicam  (MOBIC ) 15 MG tablet Take 1 tablet (15 mg total) by mouth daily. 06/27/22     Menthol -Methyl Salicylate  (MUSCLE RUB) 10-15 % CREA Apply 1 Application topically as needed for muscle pain. 10/20/22   Tobie Yetta HERO, MD  metFORMIN  (GLUCOPHAGE -XR) 500 MG 24 hr tablet Take 2 tablets (1,000 mg total) by  mouth 2 (two) times daily with a meal. 11/23/22     mupirocin  ointment (BACTROBAN ) 2 % Apply 1 Application topically 3 (three) times daily for 7 days. 08/03/23     ondansetron  (ZOFRAN -ODT) 4 MG disintegrating tablet Take 1 tablet (4 mg total) by mouth every 8 (eight) hours as needed for nausea or vomiting. 08/07/23   Marlee Lynwood NOVAK, MD  sildenafil  (VIAGRA ) 100 MG tablet Take 1 tablet (100 mg total) by mouth up to once daily as needed 12/11/20     sulfamethoxazole -trimethoprim  (BACTRIM  DS) 800-160 MG tablet Take 1 tablet by mouth 2 (two) times a day for 14 days. 11/20/23     tadalafil  (CIALIS )  20 MG tablet Take 1 tablet (20 mg total) by mouth one hour before sex as needed for E.D. 03/07/23     tamsulosin  (FLOMAX ) 0.4 MG CAPS capsule Take 1 (one) capsule by mouth daily. 08/14/23     tamsulosin  (FLOMAX ) 0.4 MG CAPS capsule Take 2 capsules by mouth daily. 11/21/23     tamsulosin  (FLOMAX ) 0.4 MG CAPS capsule Take 2 capsules (0.8 mg total) by mouth daily. 11/21/23     traMADol  (ULTRAM ) 50 MG tablet Take 1-2 tablets by mouth every 6 hours as needed. 11/21/23       Physical Exam: Vitals:   12/01/23 1730 12/01/23 1800 12/01/23 1945 12/01/23 2006  BP: (!) 163/93 (!) 156/98 (!) 153/81 (!) 153/81  Pulse: 88 81 83 83  Resp:   19 19  Temp:   98.8 F (37.1 C) 98.8 F (37.1 C)  TempSrc:   Oral Oral  SpO2: 96% 96% 97%   Weight:    98.6 kg  Height:    5' 11 (1.803 m)    Constitutional: NAD, calm, comfortable, + foley with clear urine  Eyes: PERRL, lids and conjunctivae normal ENMT: Mucous membranes are moist.  Respiratory: clear to auscultation bilaterally, no wheezing, no crackles. Normal respiratory effort. No accessory muscle use.  Cardiovascular: Regular rate and rhythm,  No extremity edema. 2+ pedal pulses. No carotid bruits.  Abdomen: no tenderness, not distended, Bowel sounds positive.  Musculoskeletal: no clubbing / cyanosis. No joint deformity upper and lower extremities. Good ROM, no contractures.  Normal muscle tone.  Skin: no rashes, lesions, ulcers. No induration Neurologic: CN 2-12 grossly intact. Sensation intact, Strength 5/5 in all 4.  Psychiatric: Normal judgment and insight. Alert and oriented x 3. Normal mood.    Urine analysis:    Component Value Date/Time   COLORURINE YELLOW 12/01/2023 1304   APPEARANCEUR CLEAR 12/01/2023 1304   LABSPEC 1.010 12/01/2023 1304   PHURINE 5.5 12/01/2023 1304   GLUCOSEU >=500 (A) 12/01/2023 1304   HGBUR NEGATIVE 12/01/2023 1304   BILIRUBINUR NEGATIVE 12/01/2023 1304   KETONESUR NEGATIVE 12/01/2023 1304   PROTEINUR NEGATIVE 12/01/2023 1304   UROBILINOGEN 0.2 06/05/2010 1558   NITRITE NEGATIVE 12/01/2023 1304   LEUKOCYTESUR SMALL (A) 12/01/2023 1304    Radiological Exams on Admission: CT PELVIS W CONTRAST Result Date: 12/01/2023 CLINICAL DATA:  Hemorrhoids and rectal pain for the past 2 weeks. Clinical concern for possible prostate abscess. Left testicle swelling and pain. Started on antibiotics 1 week ago. EXAM: CT PELVIS WITH CONTRAST TECHNIQUE: Multidetector CT imaging of the pelvis was performed using the standard protocol following the bolus administration of intravenous contrast. RADIATION DOSE REDUCTION: This exam was performed according to the departmental dose-optimization program which includes automated exposure control, adjustment of the mA and/or kV according to patient size and/or use of iterative reconstruction technique. CONTRAST:  OMNIPAQUE  IOHEXOL  300 MG/ML  SOLN COMPARISON:  08/07/2023. FINDINGS: Urinary Tract: Interval dilated bladder extending into the mid abdomen with mild diffuse wall thickening. Moderate dilatation of both distal ureters with mild progression. Bowel:  Unremarkable visualized pelvic bowel loops. Vascular/Lymphatic: The previously demonstrated 10 mm mildly enlarged left common iliac lymph node is unchanged on image number 15/2. No other enlarged lymph nodes. No significant vascular abnormalities.  Reproductive: Interval significant increase in size of the prostate gland, containing multiple loculated fluid collections with thin and moderately thickened surrounding walls occupying the majority of the prostate gland. The largest of these is located on the right, measuring 7.7 x 3.9 cm on  image number 64/2. Other: Stable postsurgical scarring in the anterior abdominal wall and subcutaneous tissues at the level of the umbilicus. Small bilateral inguinal hernias containing fat. Musculoskeletal: Stable probable bone island in the anterior right sacral ala and lumbosacral spine degenerative changes. IMPRESSION: 1. Interval significant increase in size of the prostate gland, containing multiple loculated fluid collections with thin and moderately thickened surrounding walls occupying the majority of the prostate gland. The largest of these is located on the right, measuring 7.7 x 3.9 cm. These are consistent with prostate abscesses. 2. Interval dilated bladder extending into the mid abdomen with mild diffuse wall thickening. This is consistent with bladder outlet obstruction by the markedly enlarged prostate gland. 3. Moderate dilatation of both distal ureters with mild progression. 4. Stable 10 mm mildly enlarged left common iliac lymph node, most likely reactive. 5. Small bilateral inguinal hernias containing fat. Electronically Signed   By: Elspeth Bathe M.D.   On: 12/01/2023 15:01   US  SCROTUM W/DOPPLER Result Date: 12/01/2023 CLINICAL DATA:  Left scrotal edema and pain EXAM: SCROTAL ULTRASOUND DOPPLER ULTRASOUND OF THE TESTICLES TECHNIQUE: Complete ultrasound examination of the testicles, epididymis, and other scrotal structures was performed. Color and spectral Doppler ultrasound were also utilized to evaluate blood flow to the testicles. COMPARISON:  None Available. FINDINGS: Right testicle Measurements: 5.4 x 2.2 x 3.5 cm. Normal parenchymal echogenicity and echotexture. No intratesticular mass or  calcification. Normal color flow vascularity. Left testicle Measurements: 4.9 x 2.9 x 2.8 cm. Normal parenchymal echogenicity and echotexture. No intratesticular mass or calcification identified. Diffusely increased parenchymal vascularity best appreciated on color flow imaging (image # 79). Right epididymis: Thickened and heterogeneous within the tail with diffusely increased vascularity in keeping with changes of acute epididymitis. Left epididymis: Diffusely thickened and heterogeneous with diffusely increased vascularity in keeping with changes of acute epididymitis. Hydrocele: A small complex right hydrocele is present containing extensive septa and internal debris possibly representing a hematocele or pyocele. Varicocele:  None visualized. Pulsed Doppler interrogation of both testes demonstrates normal low resistance arterial and venous waveforms bilaterally. IMPRESSION: 1. Acute right epididymitis. 2. Acute left epididymo-orchitis. 3. Small complex right hydrocele containing extensive septa and internal debris possibly representing a hematocele or pyocele. Electronically Signed   By: Dorethia Molt M.D.   On: 12/01/2023 14:23      Assessment/Plan Principal Problem:   Prostate abscess Active Problems:   Insulin  dependent type 2 diabetes mellitus (HCC)   Testicular abscess   Epididymoorchitis   Prostate Abscess  Bilateral Epididymo-orchitis  Acute urinary retention Does have leukocytosis, and blood culture Cr wnl Continue Zosyn Pain control  N.p.o. after midnight, OR in a.m., appreciate urology Dr Alvaro input  IDDM2 Lab Results  Component Value Date   HGBA1C 11.6 (H) 05/31/2023  Update a1c Lower long acting insulin  dose, add ssi Continue adjust as needed    Hyponatremia, appear dehydrated, will hydrate with ns  HTN: BP elevated likely due to pain , pain control ,continue home medication lisinopril , as needed hydralazine  DVT prophylaxis: SCDs   Code Status:   Full  code Family Communication:  Family at bedside  Patient is from: Home   Anticipated DC to: Home   Anticipated DC date: 2-3 days     Consults called:  Urology  Admission status:  Inpatient  Severity of Illness:   The appropriate patient status for this patient is INPATIENT due to history and comorbidities, severity of illness, required intensity of service to ensure the patient's safety and to  avoid risk of adverse events/further clinical deterioration.  Severity of illness/comorbidities: Large prostate abscess, bilateral epididymo-orchitis  , acute urinary retention,  Intensity of service: tests, high frequency of surveillance, interventions It is not anticipated that the patient will be medically stable for discharge from the hospital within 2 midnights of admission.    Voice Recognition Betti dictation system was used to create this note, attempts have been made to correct errors. Please contact the author with questions and/or clarifications.  Ileana Cummins MD PhD FACP [facility]  How to contact the [facility] Attending or Consulting provider 7A - 7P or covering provider during after hours 7P -7A, for this patient?   Check the care team in [facility] and look for a) attending/consulting [facility] provider listed and b) the [facility] team listed Log into [email or web address] and use [place]'s universal password to access. If you do not have the password, please contact the hospital operator. Locate the [facility] provider you are looking for under [facility] and page to a number that you can be directly reached. If you still have difficulty reaching the provider, please page the [facility] (Director on Call) for the Hospitalists listed on amion for assistance.  12/01/2023, 10:12 PM

## 2023-12-01 NOTE — ED Triage Notes (Signed)
 Pt reports hemorrhoids for 2 weeks. Has been using prep H for pain without relief.  Also left testicle swelling and pain Started on antibiotics 1 week ago by urology

## 2023-12-01 NOTE — ED Provider Notes (Cosign Needed Addendum)
 No Name EM[facility] DEPARTMENT AT [facility] Provider Note   CSN: [id number] Arrival date & time: 12/01/23  1130     Patient presents with: Hemorrhoids   Martin [name] is a 58 y.o. male with history of type II diabetes, enlarged prostate followed by urology, hypertension, presents with concern for rectal pain that has been ongoing for past 2 weeks.  Pain is constant but is temporarily relieved with bowel movement.  Denies any bloody or melanotic stools.  Denies any abdominal pain, nausea or vomiting, fever or chills.  Denies any dysuria or hematuria.  Reports he does have difficulty with urination due to enlarged prostate, but was able to urinate this morning.  States he has been having constipation ever since starting Bactrim  for testicular swelling about a week ago.  Denies any penile discharge or any concern for STIs. Denies any testicular pain or increase in swelling. He is concern for a hemorrhoid.  Last bowel movement was yesterday.   HPI     Prior to Admission medications   Medication Sig Start Date End Date Taking? Authorizing Provider  atorvastatin  (LIPITOR) 80 MG tablet Take 1 tablet (80 mg total) by mouth at bedtime. 11/23/22     clindamycin  (CLEOCIN ) 300 MG capsule Take 1 capsule (300 mg total) by mouth every 6 (six) hours for 10 days. 08/03/23     Continuous Blood Gluc Sensor (DEXCOM G7 SENSOR) MISC Change 1 sensor every 10 days on insulin  3 times daily 01/13/22     Continuous Glucose Receiver (FREESTYLE LIBRE 3 READER) DEVI Use 4 (four) times daily. 11/23/22     Continuous Glucose Sensor (FREESTYLE LIBRE 3 SENSOR) MISC Change sensor every 14 days. 11/23/22     empagliflozin  (JARDIANCE ) 25 MG TABS tablet Take 1 tablet (25 mg total) by mouth daily. 03/29/22     empagliflozin  (JARDIANCE ) 25 MG TABS tablet Take 1 tablet (25 mg total) by mouth daily. 10/23/23     finasteride  (PROSCAR ) 5 MG tablet Take 1 (one) tablet by mouth daily. 08/14/23     gabapentin  (NEURONTIN ) 300  MG capsule Take 1 capsule (300 mg total) by mouth 3 (three) times daily. 10/20/22   Tobie Yetta HERO, MD  glucose blood (FREESTYLE LITE) test strip Use three times daily as directed 03/09/21     insulin  degludec (TRESIBA  FLEXTOUCH) 200 UNIT/ML FlexTouch Pen Inject 110 Units into the skin daily. 10/20/22   Tobie Yetta HERO, MD  insulin  degludec (TRESIBA  FLEXTOUCH) 200 UNIT/ML FlexTouch Pen Inject 100 Units into the skin daily. 08/03/23     Insulin  Pen Needle 32G X 6 MM MISC Use to inject insulin  2 times a day for 90 days. 12/24/21   [name] Purchase, MD  Insulin  Pen Needle 32G X 6 MM MISC Use to inject insulin  2 (two) times daily. 06/01/23     Lancets (FREESTYLE) lancets Use three times daily as directed 03/09/21   [name] Purchase, MD  lisinopril  (ZESTRIL ) 10 MG tablet Take 1 tablet (10 mg total) by mouth daily. 11/23/22     meloxicam  (MOBIC ) 15 MG tablet Take 1 tablet (15 mg total) by mouth daily. 06/27/22     Menthol -Methyl Salicylate  (MUSCLE RUB) 10-15 % CREA Apply 1 Application topically as needed for muscle pain. 10/20/22   Tobie Yetta HERO, MD  metFORMIN  (GLUCOPHAGE -XR) 500 MG 24 hr tablet Take 2 tablets (1,000 mg total) by mouth 2 (two) times daily with a meal. 11/23/22     mupirocin  ointment (BACTROBAN ) 2 % Apply 1 Application topically 3 (  three) times daily for 7 days. 08/03/23     ondansetron  (ZOFRAN -ODT) 4 MG disintegrating tablet Take 1 tablet (4 mg total) by mouth every 8 (eight) hours as needed for nausea or vomiting. 08/07/23   Marlee Lynwood NOVAK, MD  sildenafil  (VIAGRA ) 100 MG tablet Take 1 tablet (100 mg total) by mouth up to once daily as needed 12/11/20     sulfamethoxazole -trimethoprim  (BACTRIM  DS) 800-160 MG tablet Take 1 tablet by mouth 2 (two) times a day for 14 days. 11/20/23     tadalafil  (CIALIS ) 20 MG tablet Take 1 tablet (20 mg total) by mouth one hour before sex as needed for E.D. 03/07/23     tamsulosin  (FLOMAX ) 0.4 MG CAPS capsule Take 1 (one) capsule by mouth daily. 08/14/23     tamsulosin   (FLOMAX ) 0.4 MG CAPS capsule Take 2 capsules by mouth daily. 11/21/23     tamsulosin  (FLOMAX ) 0.4 MG CAPS capsule Take 2 capsules (0.8 mg total) by mouth daily. 11/21/23     traMADol  (ULTRAM ) 50 MG tablet Take 1-2 tablets by mouth every 6 hours as needed. 11/21/23       Allergies: Patient has no known allergies.    Review of Systems  Genitourinary:  Positive for scrotal swelling. Negative for testicular pain.    Updated Vital Signs BP (!) 156/98   Pulse 81   Temp 98.2 F (36.8 C) (Oral)   Resp 18   Wt 99.8 kg   SpO2 96%   BMI 30.68 kg/m   Physical Exam Vitals and nursing note reviewed. Exam conducted with a chaperone present.  Constitutional:      Appearance: Normal appearance.     Comments: Appears slightly uncomfortable due to pain.  No acute distress.  HENT:     Head: Atraumatic.   Cardiovascular:     Rate and Rhythm: Normal rate and regular rhythm.  Pulmonary:     Effort: Pulmonary effort is normal.  Genitourinary:    Comments: RN Aleck Molt present for testicular and rectal exam  On rectal exam, patient without external anal fissure or hemorrhoid. No internal hemorrhoid appreciated. Discomfort with rectal exam. Stool brown appearing, no melanotic stools. No rectal bleeding  Left scrotum firm and swollen with mild overlying erythema. No area of fluctuance. Unable to distinguish the testicle from epididymis on exam  Right scrotum soft and without erythema or edema.  Right testicle and epididymis without tenderness on exam.  Penis without any erythema, edema, lesions.  No penile discharge  Neurological:     General: No focal deficit present.     Mental Status: He is alert.   Psychiatric:        Mood and Affect: Mood normal.        Behavior: Behavior normal.     (all labs ordered are listed, but only abnormal results are displayed) Labs Reviewed  URINALYSIS, ROUTINE W REFLEX MICROSCOPIC - Abnormal; Notable for the following components:      Result Value    Glucose, UA >=500 (*)    Leukocytes,Ua SMALL (*)    All other components within normal limits  CBC WITH DIFFERENTIAL/PLATELET - Abnormal; Notable for the following components:   WBC 17.4 (*)    Hemoglobin 12.0 (*)    HCT 36.3 (*)    Platelets 473 (*)    Neutro Abs 13.8 (*)    Monocytes Absolute 1.3 (*)    Abs Immature Granulocytes 0.14 (*)    All other components within normal limits  COMPREHENSIVE METABOLIC PANEL WITH  GFR - Abnormal; Notable for the following components:   Sodium 134 (*)    Glucose, Bld 163 (*)    Total Protein 9.2 (*)    Albumin 3.3 (*)    Alkaline Phosphatase 241 (*)    All other components within normal limits  URINALYSIS, MICROSCOPIC (REFLEX) - Abnormal; Notable for the following components:   Bacteria, UA MANY (*)    All other components within normal limits  URINE CULTURE  RAPID HIV SCREEN (HIV 1/2 AB+AG)    EKG: None  Radiology: CT PELVIS W CONTRAST Result Date: 12/01/2023 CLINICAL DATA:  Hemorrhoids and rectal pain for the past 2 weeks. Clinical concern for possible prostate abscess. Left testicle swelling and pain. Started on antibiotics 1 week ago. EXAM: CT PELVIS WITH CONTRAST TECHNIQUE: Multidetector CT imaging of the pelvis was performed using the standard protocol following the bolus administration of intravenous contrast. RADIATION DOSE REDUCTION: This exam was performed according to the departmental dose-optimization program which includes automated exposure control, adjustment of the mA and/or kV according to patient size and/or use of iterative reconstruction technique. CONTRAST:  OMNIPAQUE  IOHEXOL  300 MG/ML  SOLN COMPARISON:  08/07/2023. FINDINGS: Urinary Tract: Interval dilated bladder extending into the mid abdomen with mild diffuse wall thickening. Moderate dilatation of both distal ureters with mild progression. Bowel:  Unremarkable visualized pelvic bowel loops. Vascular/Lymphatic: The previously demonstrated 10 mm mildly enlarged left  common iliac lymph node is unchanged on image number 15/2. No other enlarged lymph nodes. No significant vascular abnormalities. Reproductive: Interval significant increase in size of the prostate gland, containing multiple loculated fluid collections with thin and moderately thickened surrounding walls occupying the majority of the prostate gland. The largest of these is located on the right, measuring 7.7 x 3.9 cm on image number 64/2. Other: Stable postsurgical scarring in the anterior abdominal wall and subcutaneous tissues at the level of the umbilicus. Small bilateral inguinal hernias containing fat. Musculoskeletal: Stable probable bone island in the anterior right sacral ala and lumbosacral spine degenerative changes. IMPRESSION: 1. Interval significant increase in size of the prostate gland, containing multiple loculated fluid collections with thin and moderately thickened surrounding walls occupying the majority of the prostate gland. The largest of these is located on the right, measuring 7.7 x 3.9 cm. These are consistent with prostate abscesses. 2. Interval dilated bladder extending into the mid abdomen with mild diffuse wall thickening. This is consistent with bladder outlet obstruction by the markedly enlarged prostate gland. 3. Moderate dilatation of both distal ureters with mild progression. 4. Stable 10 mm mildly enlarged left common iliac lymph node, most likely reactive. 5. Small bilateral inguinal hernias containing fat. Electronically Signed   By: Elspeth Bathe M.D.   On: 12/01/2023 15:01   US  SCROTUM W/DOPPLER Result Date: 12/01/2023 CLINICAL DATA:  Left scrotal edema and pain EXAM: SCROTAL ULTRASOUND DOPPLER ULTRASOUND OF THE TESTICLES TECHNIQUE: Complete ultrasound examination of the testicles, epididymis, and other scrotal structures was performed. Color and spectral Doppler ultrasound were also utilized to evaluate blood flow to the testicles. COMPARISON:  None Available. FINDINGS: Right  testicle Measurements: 5.4 x 2.2 x 3.5 cm. Normal parenchymal echogenicity and echotexture. No intratesticular mass or calcification. Normal color flow vascularity. Left testicle Measurements: 4.9 x 2.9 x 2.8 cm. Normal parenchymal echogenicity and echotexture. No intratesticular mass or calcification identified. Diffusely increased parenchymal vascularity best appreciated on color flow imaging (image # 79). Right epididymis: Thickened and heterogeneous within the tail with diffusely increased vascularity in keeping with changes  of acute epididymitis. Left epididymis: Diffusely thickened and heterogeneous with diffusely increased vascularity in keeping with changes of acute epididymitis. Hydrocele: A small complex right hydrocele is present containing extensive septa and internal debris possibly representing a hematocele or pyocele. Varicocele:  None visualized. Pulsed Doppler interrogation of both testes demonstrates normal low resistance arterial and venous waveforms bilaterally. IMPRESSION: 1. Acute right epididymitis. 2. Acute left epididymo-orchitis. 3. Small complex right hydrocele containing extensive septa and internal debris possibly representing a hematocele or pyocele. Electronically Signed   By: Dorethia Molt M.D.   On: 12/01/2023 14:23     Procedures   Medications Ordered in the ED  vancomycin  (VANCOCIN ) IVPB 1000 mg/200 mL premix (0 mg Intravenous Stopped 12/01/23 1759)    Followed by  vancomycin  (VANCOCIN ) IVPB 1000 mg/200 mL premix (1,000 mg Intravenous New Bag/Given 12/01/23 1802)  lidocaine  (LMX) 4 % cream (1 Application Topical Given 12/01/23 1306)  fentaNYL  (SUBLIMAZE ) injection 50 mcg (50 mcg Intravenous Given 12/01/23 1304)  iohexol  (OMNIPAQUE ) 300 MG/ML solution 100 mL (100 mLs Intravenous Contrast Given 12/01/23 1429)  fentaNYL  (SUBLIMAZE ) injection 50 mcg (50 mcg Intravenous Given 12/01/23 1448)  piperacillin-tazobactam (ZOSYN) IVPB 3.375 g (3.375 g Intravenous New Bag/Given 12/01/23  1824)  fentaNYL  (SUBLIMAZE ) injection 50 mcg (50 mcg Intravenous Given 12/01/23 1755)  ketorolac  (TORADOL ) 15 MG/ML injection 15 mg (15 mg Intravenous Given 12/01/23 1755)  lactated ringers  bolus 1,000 mL (1,000 mLs Intravenous New Bag/Given 12/01/23 1828)  lidocaine  (XYLOCAINE ) 2 % jelly 1 Application (1 Application Urethral Given 12/01/23 1824)    Clinical Course as of 12/01/23 1901  Fri Dec 01, 2023  1615 Patient declines foley catheter even after bladder scan shows 800 post void.   [AF]    Clinical Course User Index [AF] Veta Palma, PA-C                                 Medical Decision Making Amount and/or Complexity of Data Reviewed Labs: ordered. Radiology: ordered.  Risk OTC drugs. Prescription drug management. Decision regarding hospitalization.     Differential diagnosis includes but is not limited to epididymitis, orchitis, proctitis, prostate abscess, hemorrhoid, anal fissure, urinary retention, UTI  ED Course:  Upon initial evaluation, patient appears uncomfortable, in no acute distress.  Stable vitals aside from his mildly elevated blood pressure 162/72.  Abdomen is soft and nontender to palpation.  On testicular exam, patient with significantly edematous and firm left scrotum.  Difficult to distinguish testicle and epididymis due to the edema.  No areas of fluctuance.  Right scrotum without erythema or edema.  Right testicle and epididymis without tenderness to palpation.  Penis without discharge, lesions, erythema, edema. On rectal exam, no external hemorrhoid or anal fissure noted.  No internal hemorrhoid appreciated.  Patient with mild discomfort on digital rectal exam.  Stool brown appearing, no rectal bleeding or melanotic stools. Discussed this case with my attending Dr. Neysa who felt patient may benefit from CT pelvis to rule out colonic/prostate abscess.  I discussed this with the patient who is in agreement with CT scan.  Labs Ordered: I Ordered, and  personally interpreted labs.  The pertinent results include:   CBC with leukocytosis of 17.4, hemoglobin 12 CMP with elevated alk phos at 241 Urinalysis with leukocytes and bacteria noted Urine culture pending Rapid HIV pending  Imaging Studies ordered: I ordered imaging studies including CT pelvis, scrotal ultrasound I independently visualized the imaging with scope of  interpretation limited to determining acute life threatening conditions related to emergency care. Scrotal ultrasound with acute right epididymitis and acute left epididymoorchitis.  Small complex right hydrocele containing extensive septa and internal debris possibly representing a hematocele or pyocele CT pelvis with containing multiple loculated fluid collections with thin and moderately thickened surrounding walls occupying the majority of the prostate gland. The largest of these is located on the right, measuring 7.7 x 3.9 cm. These are consistent with prostate abscesses. 2. Interval dilated bladder extending into the mid abdomen with mild diffuse wall thickening. This is consistent with bladder outlet obstruction by the markedly enlarged prostate gland. 3. Moderate dilatation of both distal ureters with mild progression. 4. Stable 10 mm mildly enlarged left common iliac lymph node, most likely reactive. 5. Small bilateral inguinal hernias containing fat. I agree with the radiologist interpretation   Consultations Obtained: I requested consultation with the urology Dr. Alvaro,  and discussed lab and imaging findings as well as pertinent plan - they recommend: Admission to [facility] hospitalist.  They will see patient in consult in anticipation of surgical management of his abscess.  Recommended starting patient on vancomycin  and gentamicin  and requested HIV test be added on.  He request patient be kept n.p.o.  I requested consultation with the hospitalist Dr. Cindy,  and discussed lab and imaging findings as well as  pertinent plan - they recommend: admission to [place]  Medications Given: Fentanyl  and toradol  for pain LR for maintenance fluids   2:51 PM.  Patient had bladder scan performed which showed over 800 mL in the bladder.  Patient did void just prior to the scan.  This is concerning for acute urinary retention.  I recommended placing Foley catheter.  Patient declines catheter at this time. He understands bladder could rupture or lead to worsening UTI/pyelonephritis/other infection without catheter in place.  4pm: Upon re-evaluation, patient with pain improved after receiving fentanyl .  Discussed that a large prostate abscess was found on his CT scan which would explain his rectal pain.  We discussed that urology recommends admission for IV antibiotics and likely surgical management.  Patient is in agreement with admission.  IV vancomycin  and gentamicin  has been ordered per urology recommendations.  Patient being kept n.p.o. Last water intake around 4pm.  He does have leukocytosis, but no fever, tachycardia, increased respiratory rate or hypotension, no concern for sepsis.  6:01pm. Updated patient that he has been admitted to [facility].  I again discussed the concern for acute urinary retention with the patient and that this could also be contributing to pain.  Discussed that they will likely have to place a catheter anyways for surgery.  He is now in agreement with placement of catheter. Gentamycin changed to Zosyn per pharmacy.  6:48 PM.  Catheter placed with over 2 L of urine output. Reports improvement in his pain.     Impression: Prostate abscess Left epididymoorchitis Right epididymitis  Disposition:  Admission with hospitalist Dr. Cindy for IV antibiotics in the setting of prostate abscess and orchitis/epididymitis. Urology Dr. Patrcia to consult    Record Review: External records from outside source obtained and reviewed including  Scrotal ultrasound performed 11/15/2023 for his left  testicular swelling which revealed orchitis and epididymitis.  Mildly complex hydrocele and scrotal wall thickening noted.     This chart was dictated using voice recognition software, Dragon. Despite the best efforts of this provider to proofread and correct errors, errors may still occur which can change documentation meaning.  Final diagnoses:  Prostate abscess    ED Discharge Orders     None          Veta Palma, PA-C 12/01/23 1747    Veta Palma, PA-C 12/01/23 1901    Neysa Caron PARAS, DO 12/02/23 1135

## 2023-12-02 ENCOUNTER — Inpatient Hospital Stay (HOSPITAL_COMMUNITY): Admitting: Certified Registered Nurse Anesthetist

## 2023-12-02 ENCOUNTER — Encounter (HOSPITAL_COMMUNITY)
Admission: EM | Disposition: A | Discharge status: Home Health | Payer: Self-pay | Source: Home / Self Care | Attending: Internal Medicine

## 2023-12-02 DIAGNOSIS — Z794 Long term (current) use of insulin: Secondary | ICD-10-CM | POA: Diagnosis not present

## 2023-12-02 DIAGNOSIS — E1165 Type 2 diabetes mellitus with hyperglycemia: Secondary | ICD-10-CM

## 2023-12-02 DIAGNOSIS — I10 Essential (primary) hypertension: Secondary | ICD-10-CM

## 2023-12-02 DIAGNOSIS — N412 Abscess of prostate: Secondary | ICD-10-CM | POA: Diagnosis not present

## 2023-12-02 HISTORY — PX: TRANSURETHRAL RESECTION OF PROSTATE: SHX73

## 2023-12-02 LAB — CBC
HCT: 35.7 % — ABNORMAL LOW (ref 39.0–52.0)
Hemoglobin: 10.9 g/dL — ABNORMAL LOW (ref 13.0–17.0)
MCH: 27.2 pg (ref 26.0–34.0)
MCHC: 30.5 g/dL (ref 30.0–36.0)
MCV: 89 fL (ref 80.0–100.0)
Platelets: 398 10*3/uL (ref 150–400)
RBC: 4.01 MIL/uL — ABNORMAL LOW (ref 4.22–5.81)
RDW: 13.2 % (ref 11.5–15.5)
WBC: 18.6 10*3/uL — ABNORMAL HIGH (ref 4.0–10.5)
nRBC: 0.5 % — ABNORMAL HIGH (ref 0.0–0.2)

## 2023-12-02 LAB — COMPREHENSIVE METABOLIC PANEL WITH GFR
ALT: 11 U/L (ref 0–44)
AST: 13 U/L — ABNORMAL LOW (ref 15–41)
Albumin: 2.3 g/dL — ABNORMAL LOW (ref 3.5–5.0)
Alkaline Phosphatase: 128 U/L — ABNORMAL HIGH (ref 38–126)
Anion gap: 9 (ref 5–15)
BUN: 11 mg/dL (ref 6–20)
CO2: 23 mmol/L (ref 22–32)
Calcium: 8.6 mg/dL — ABNORMAL LOW (ref 8.9–10.3)
Chloride: 100 mmol/L (ref 98–111)
Creatinine, Ser: 0.7 mg/dL (ref 0.61–1.24)
GFR, Estimated: >60 mL/min (ref >60)
Glucose, Bld: 174 mg/dL — ABNORMAL HIGH (ref 70–99)
Potassium: 3.6 mmol/L (ref 3.5–5.1)
Sodium: 132 mmol/L — ABNORMAL LOW (ref 135–145)
Total Bilirubin: 0.7 mg/dL (ref 0.0–1.2)
Total Protein: 7.8 g/dL (ref 6.5–8.1)

## 2023-12-02 LAB — GLUCOSE, CAPILLARY
Glucose-Capillary: 165 mg/dL — ABNORMAL HIGH (ref 70–99)
Glucose-Capillary: 133 mg/dL — ABNORMAL HIGH (ref 70–99)
Glucose-Capillary: 121 mg/dL — ABNORMAL HIGH (ref 70–99)
Glucose-Capillary: 244 mg/dL — ABNORMAL HIGH (ref 70–99)

## 2023-12-02 SURGERY — TURP (TRANSURETHRAL RESECTION OF PROSTATE)
Anesthesia: General | Site: Prostate

## 2023-12-02 MED ORDER — FENTANYL CITRATE (PF) 250 MCG/5ML IJ SOLN
INTRAMUSCULAR | Status: AC
  Filled 2023-12-02: qty 5

## 2023-12-02 MED ORDER — PROPOFOL 10 MG/ML IV BOLUS
INTRAVENOUS | Status: DC | PRN
  Administered 2023-12-02: 200 mg via INTRAVENOUS

## 2023-12-02 MED ORDER — LACTATED RINGERS IV SOLN: INTRAVENOUS | Status: DC | PRN

## 2023-12-02 MED ORDER — AMISULPRIDE (ANTIEMETIC) 5 MG/2ML IV SOLN: 10.0000 mg | Freq: Once | INTRAVENOUS | Status: DC | PRN

## 2023-12-02 MED ORDER — ACETAMINOPHEN 10 MG/ML IV SOLN: 1000.0000 mg | Freq: Once | INTRAVENOUS | Status: DC | PRN

## 2023-12-02 MED ORDER — FENTANYL CITRATE (PF) 250 MCG/5ML IJ SOLN
INTRAMUSCULAR | Status: DC | PRN
  Administered 2023-12-02 (×4): 25 ug via INTRAVENOUS
  Administered 2023-12-02: 50 ug via INTRAVENOUS
  Administered 2023-12-02 (×2): 25 ug via INTRAVENOUS

## 2023-12-02 MED ORDER — FENTANYL CITRATE PF 50 MCG/ML IJ SOSY
25.0000 ug | PREFILLED_SYRINGE | INTRAMUSCULAR | Status: DC | PRN
  Administered 2023-12-02 (×3): 50 ug via INTRAVENOUS

## 2023-12-02 MED ORDER — OXYCODONE HCL 5 MG PO TABS: 5.0000 mg | ORAL_TABLET | Freq: Once | ORAL | Status: DC | PRN

## 2023-12-02 MED ORDER — ACETAMINOPHEN 10 MG/ML IV SOLN
INTRAVENOUS | Status: DC | PRN
  Administered 2023-12-02: 1000 mg via INTRAVENOUS

## 2023-12-02 MED ORDER — SODIUM CHLORIDE 0.9 % IR SOLN
3000.0000 mL | Status: DC
  Administered 2023-12-02 – 2023-12-04 (×8): 3000 mL

## 2023-12-02 MED ORDER — STERILE WATER FOR IRRIGATION IR SOLN
Status: DC | PRN
  Administered 2023-12-02: 500 mL

## 2023-12-02 MED ORDER — PROPOFOL 10 MG/ML IV BOLUS
INTRAVENOUS | Status: AC
  Filled 2023-12-02: qty 20

## 2023-12-02 MED ORDER — STERILE WATER FOR IRRIGATION IR SOLN
Status: DC | PRN
  Administered 2023-12-02: 6000 mL

## 2023-12-02 MED ORDER — DEXAMETHASONE SODIUM PHOSPHATE 10 MG/ML IJ SOLN
INTRAMUSCULAR | Status: DC | PRN
Start: 2023-12-02 — End: 2023-12-02
  Administered 2023-12-02: 5 mg via INTRAVENOUS

## 2023-12-02 MED ORDER — LIDOCAINE 2% (20 MG/ML) 5 ML SYRINGE
INTRAMUSCULAR | Status: DC | PRN
Start: 2023-12-02 — End: 2023-12-02
  Administered 2023-12-02: 60 mg via INTRAVENOUS

## 2023-12-02 MED ORDER — MIDAZOLAM HCL 2 MG/2ML IJ SOLN
INTRAMUSCULAR | Status: AC
Start: 2023-12-02 — End: 2023-12-02
  Filled 2023-12-02: qty 2

## 2023-12-02 MED ORDER — CHLORHEXIDINE GLUCONATE CLOTH 2 % EX PADS
6.0000 | MEDICATED_PAD | Freq: Every day | CUTANEOUS | Status: DC
  Administered 2023-12-02 – 2023-12-06 (×5): 6 via TOPICAL

## 2023-12-02 MED ORDER — ACETAMINOPHEN 10 MG/ML IV SOLN
INTRAVENOUS | Status: AC
  Filled 2023-12-02: qty 100

## 2023-12-02 MED ORDER — ONDANSETRON HCL 4 MG/2ML IJ SOLN
INTRAMUSCULAR | Status: DC | PRN
Start: 2023-12-02 — End: 2023-12-02
  Administered 2023-12-02: 4 mg via INTRAVENOUS

## 2023-12-02 MED ORDER — SODIUM CHLORIDE 0.9 % IR SOLN
Status: DC | PRN
  Administered 2023-12-02: 1000 mL

## 2023-12-02 MED ORDER — MIDAZOLAM HCL 2 MG/2ML IJ SOLN
INTRAMUSCULAR | Status: DC | PRN
  Administered 2023-12-02: 2 mg via INTRAVENOUS

## 2023-12-02 MED ORDER — INSULIN GLARGINE-YFGN 100 UNIT/ML ~~LOC~~ SOLN
40.0000 [IU] | Freq: Every day | SUBCUTANEOUS | Status: DC
  Administered 2023-12-03 – 2023-12-06 (×3): 40 [IU] via SUBCUTANEOUS
  Filled 2023-12-02 (×5): qty 0.4

## 2023-12-02 MED ORDER — FENTANYL CITRATE PF 50 MCG/ML IJ SOSY
PREFILLED_SYRINGE | INTRAMUSCULAR | Status: AC
  Filled 2023-12-02: qty 3

## 2023-12-02 MED ORDER — OXYCODONE HCL 5 MG/5ML PO SOLN: 5.0000 mg | Freq: Once | ORAL | Status: DC | PRN

## 2023-12-02 MED ORDER — SODIUM CHLORIDE 0.9 % IR SOLN
Status: DC | PRN
  Administered 2023-12-02: 15000 mL

## 2023-12-02 MED ORDER — KETOROLAC TROMETHAMINE 30 MG/ML IJ SOLN: 30.0000 mg | Freq: Once | INTRAMUSCULAR | Status: DC | PRN

## 2023-12-02 MED ORDER — PHENYLEPHRINE 80 MCG/ML (10ML) SYRINGE FOR IV PUSH (FOR BLOOD PRESSURE SUPPORT)
PREFILLED_SYRINGE | INTRAVENOUS | Status: DC | PRN
  Administered 2023-12-02: 160 ug via INTRAVENOUS
  Administered 2023-12-02 (×2): 80 ug via INTRAVENOUS
  Administered 2023-12-02: 160 ug via INTRAVENOUS

## 2023-12-02 SURGICAL SUPPLY — 21 items
BAG URINE DRAIN 2000ML AR STRL (UROLOGICAL SUPPLIES) ×2 IMPLANT
BAG URO CATCHER STRL LF (MISCELLANEOUS) ×2 IMPLANT
CATH HEMA 3WAY 30CC 24FR COUDE (CATHETERS) IMPLANT
CATH URTH STD 24FR FL 3W 2 (CATHETERS) IMPLANT
CNTNR URN SCR LID CUP LEK RST (MISCELLANEOUS) IMPLANT
DRAPE FOOT SWITCH (DRAPES) ×2 IMPLANT
GLOVE SURG LX STRL 7.5 STRW (GLOVE) ×2 IMPLANT
GOWN STRL REUS W/ TWL XL LVL3 (GOWN DISPOSABLE) ×2 IMPLANT
GUIDEWIRE STR DUAL SENSOR (WIRE) IMPLANT
HOLDER FOLEY CATH W/STRAP (MISCELLANEOUS) IMPLANT
IV CATH AUTO 14GX1.75 SAFE ORG (IV SOLUTION) IMPLANT
KIT TURNOVER KIT A (KITS) ×2 IMPLANT
LOOP CUT BIPOLAR 24F LRG (ELECTROSURGICAL) IMPLANT
MANIFOLD NEPTUNE II (INSTRUMENTS) ×2 IMPLANT
PACK CYSTO (CUSTOM PROCEDURE TRAY) ×2 IMPLANT
PAD PREP 24X48 CUFFED NSTRL (MISCELLANEOUS) ×2 IMPLANT
SYR 30ML LL (SYRINGE) ×2 IMPLANT
SYRINGE TOOMEY IRRIG 70ML (MISCELLANEOUS) ×2 IMPLANT
TUBING CONNECTING 10 (TUBING) ×2 IMPLANT
TUBING UROLOGY SET (TUBING) ×2 IMPLANT
WATER STERILE IRR 500ML POUR (IV SOLUTION) IMPLANT

## 2023-12-02 NOTE — Plan of Care (Signed)

## 2023-12-02 NOTE — Progress Notes (Signed)
 Subjective/Chief Complaint:   1 - Large Prostate Abscess / Bilateral Epidiymorchitis / Non-Compliant Diabetic - diabetic with A1c 11's and prior amputations with huge prostate abscess (R>L) on CT 11/2023 on eval fevers and perineal pain. Scrotal US  also with bilateral epidiymorchitis. WBC 17k.  UCX 08/2023 MRSA. Plaec on VAnc + Zosyn.    2 - Urinary Retention - massively distended bladder on CT 2025 form prostate abscess. Cr 0.74. Foley placed in ER.     Today Martin [name] is stable. Only low grade fevers overnight. Catheter working well. WBC still quite elevated at 18k.   Objective: Vital signs in last 24 hours: Temp:  [97.6 F (36.4 C)-99.4 F (37.4 C)] 99.4 F (37.4 C) (06/28 0324) Pulse Rate:  [78-88] 78 (06/28 0324) Resp:  [17-19] 17 (06/28 0324) BP: (127-171)/(69-98) 171/81 (06/28 0324) SpO2:  [95 %-99 %] 98 % (06/28 0324) Weight:  [98.6 kg-99.8 kg] 98.6 kg (06/27 2006) Last BM Date : 11/30/23  Intake/Output from previous day: 06/27 0701 - 06/28 0700 In: 95.3 [I.V.:45.3; IV Piggyback:50] Out: 3250 [Urine:3250] Intake/Output this shift: No intake/output data recorded.  NAD, AOx3, pleasant, non-toxic Non-labored breathing on RA RRR STable moderate truncal obesity Foely in place with medium yellow urine   Lab Results:  Recent Labs    12/01/23 1304 12/02/23 0531  WBC 17.4* 18.6*  HGB 12.0* 10.9*  HCT 36.3* 35.7*  PLT 473* 398   BMET Recent Labs    12/01/23 1304 12/02/23 0531  NA 134* 132*  K 4.0 3.6  CL 99 100  CO2 22 23  GLUCOSE 163* 174*  BUN 13 11  CREATININE 0.74 0.70  CALCIUM  9.6 8.6*   PT/INR No results for input(s): LABPROT, INR in the last 72 hours. ABG No results for input(s): PHART, HCO3 in the last 72 hours.  Invalid input(s): PCO2, PO2  Studies/Results: CT PELVIS W CONTRAST Result Date: 12/01/2023 CLINICAL DATA:  Hemorrhoids and rectal pain for the past 2 weeks. Clinical concern for possible prostate abscess. Left testicle  swelling and pain. Started on antibiotics 1 week ago. EXAM: CT PELVIS WITH CONTRAST TECHNIQUE: Multidetector CT imaging of the pelvis was performed using the standard protocol following the bolus administration of intravenous contrast. RADIATION DOSE REDUCTION: This exam was performed according to the departmental dose-optimization program which includes automated exposure control, adjustment of the mA and/or kV according to patient size and/or use of iterative reconstruction technique. CONTRAST:  OMNIPAQUE  IOHEXOL  300 MG/ML  SOLN COMPARISON:  08/07/2023. FINDINGS: Urinary Tract: Interval dilated bladder extending into the mid abdomen with mild diffuse wall thickening. Moderate dilatation of both distal ureters with mild progression. Bowel:  Unremarkable visualized pelvic bowel loops. Vascular/Lymphatic: The previously demonstrated 10 mm mildly enlarged left common iliac lymph node is unchanged on image number 15/2. No other enlarged lymph nodes. No significant vascular abnormalities. Reproductive: Interval significant increase in size of the prostate gland, containing multiple loculated fluid collections with thin and moderately thickened surrounding walls occupying the majority of the prostate gland. The largest of these is located on the right, measuring 7.7 x 3.9 cm on image number 64/2. Other: Stable postsurgical scarring in the anterior abdominal wall and subcutaneous tissues at the level of the umbilicus. Small bilateral inguinal hernias containing fat. Musculoskeletal: Stable probable bone island in the anterior right sacral ala and lumbosacral spine degenerative changes. IMPRESSION: 1. Interval significant increase in size of the prostate gland, containing multiple loculated fluid collections with thin and moderately thickened surrounding walls occupying the majority of  the prostate gland. The largest of these is located on the right, measuring 7.7 x 3.9 cm. These are consistent with prostate  abscesses. 2. Interval dilated bladder extending into the mid abdomen with mild diffuse wall thickening. This is consistent with bladder outlet obstruction by the markedly enlarged prostate gland. 3. Moderate dilatation of both distal ureters with mild progression. 4. Stable 10 mm mildly enlarged left common iliac lymph node, most likely reactive. 5. Small bilateral inguinal hernias containing fat. Electronically Signed   By: Elspeth Bathe M.D.   On: 12/01/2023 15:01   US  SCROTUM W/DOPPLER Result Date: 12/01/2023 CLINICAL DATA:  Left scrotal edema and pain EXAM: SCROTAL ULTRASOUND DOPPLER ULTRASOUND OF THE TESTICLES TECHNIQUE: Complete ultrasound examination of the testicles, epididymis, and other scrotal structures was performed. Color and spectral Doppler ultrasound were also utilized to evaluate blood flow to the testicles. COMPARISON:  None Available. FINDINGS: Right testicle Measurements: 5.4 x 2.2 x 3.5 cm. Normal parenchymal echogenicity and echotexture. No intratesticular mass or calcification. Normal color flow vascularity. Left testicle Measurements: 4.9 x 2.9 x 2.8 cm. Normal parenchymal echogenicity and echotexture. No intratesticular mass or calcification identified. Diffusely increased parenchymal vascularity best appreciated on color flow imaging (image # 79). Right epididymis: Thickened and heterogeneous within the tail with diffusely increased vascularity in keeping with changes of acute epididymitis. Left epididymis: Diffusely thickened and heterogeneous with diffusely increased vascularity in keeping with changes of acute epididymitis. Hydrocele: A small complex right hydrocele is present containing extensive septa and internal debris possibly representing a hematocele or pyocele. Varicocele:  None visualized. Pulsed Doppler interrogation of both testes demonstrates normal low resistance arterial and venous waveforms bilaterally. IMPRESSION: 1. Acute right epididymitis. 2. Acute left  epididymo-orchitis. 3. Small complex right hydrocele containing extensive septa and internal debris possibly representing a hematocele or pyocele. Electronically Signed   By: Dorethia Molt M.D.   On: 12/01/2023 14:23    Anti-infectives: Anti-infectives (From admission, onward)    Start     Dose/Rate Route Frequency Ordered Stop   12/02/23 0000  piperacillin-tazobactam (ZOSYN) IVPB 3.375 g        3.375 g 12.5 mL/hr over 240 Minutes Intravenous Every 8 hours 12/01/23 1910     12/01/23 1800  vancomycin  (VANCOCIN ) IVPB 1000 mg/200 mL premix       Placed in Followed by Linked Group   1,000 mg 200 mL/hr over 60 Minutes Intravenous  Once 12/01/23 1645 12/01/23 1902   12/01/23 1730  piperacillin-tazobactam (ZOSYN) IVPB 3.375 g        3.375 g 100 mL/hr over 30 Minutes Intravenous  Once 12/01/23 1721 12/01/23 1854   12/01/23 1700  gentamicin  (GARAMYCIN ) 600 mg in dextrose  5 % 100 mL IVPB  Status:  Discontinued        7 mg/kg  85.1 kg (Adjusted) 115 mL/hr over 60 Minutes Intravenous  Once 12/01/23 1645 12/01/23 1721   12/01/23 1645  vancomycin  (VANCOCIN ) IVPB 1000 mg/200 mL premix       Placed in Followed by Linked Group   1,000 mg 200 mL/hr over 60 Minutes Intravenous  Once 12/01/23 1645 12/01/23 1759   12/01/23 1615  vancomycin  (VANCOREADY) IVPB 2000 mg/400 mL  Status:  Discontinued        2,000 mg 200 mL/hr over 120 Minutes Intravenous  Once 12/01/23 1614 12/01/23 1645   12/01/23 1615  gentamicin  (GARAMYCIN ) 200 mg in dextrose  5 % 50 mL IVPB  Status:  Discontinued        2 mg/kg  99.8 kg 110 mL/hr over 30 Minutes Intravenous  Once 12/01/23 1614 12/01/23 1645       Assessment/Plan:  Proceed as planned with TURP / Unroof prosate abscess today. RIsks, benefits, alternatives, expected peri-op cours discussed previousliy and reiterated today. HE will need cathteer post-op as well.    Ricardo KATHEE Alvaro Mickey. 12/02/2023

## 2023-12-02 NOTE — Inpatient Diabetes Management (Signed)
 Inpatient Diabetes Program Recommendations  AACE/ADA: New Consensus Statement on Inpatient Glycemic Control (2015)  Target Ranges:  Prepandial:   less than 140 mg/dL      Peak postprandial:   less than 180 mg/dL (1-2 hours)      Critically ill patients:  140 - 180 mg/dL   Lab Results  Component Value Date   GLUCAP 165 (H) 12/02/2023   HGBA1C 10.9 (H) 12/01/2023    Review of Glycemic Control  Latest Reference Range & Units 06/03/23 06:05 12/02/23 07:43  Glucose-Capillary 70 - 99 mg/dL 880 (H) 834 (H)   Diabetes history: DM 2 Outpatient Diabetes medications:  Jardiance  25 mg daily Tresiba  110 units daily Metformin  1000 mg bid Current orders for Inpatient glycemic control:  Semglee  40 units daily Novolog  0-15 units tid with meals  Inpatient Diabetes Program Recommendations:    Referral received for elevated A1C.  Of note, patient was seen by DM coordinator in December of [address]9975 for A1C of 11.6%.  There has been slight improvement since then.   -? If Jardiance  needs to be d/c'd from home regimen due to prostate abscess?   DM coordinator will follow up on 6/30 with patient.   NOTE: Noted consult for Diabetes Coordinator. Diabetes Coordinator is not on campus over the weekend but available by pager from 8am to 5pm for questions or concerns.     Thanks,  Randall Bullocks, RN, BC-ADM Inpatient Diabetes Coordinator Pager [phone]  (on call 8a-5p)

## 2023-12-02 NOTE — Anesthesia Preprocedure Evaluation (Addendum)
 Anesthesia Evaluation  Patient identified by MRN, date of birth, ID band Patient awake    Reviewed: Allergy & Precautions, NPO status , Patient's Chart, lab work & pertinent test results  Airway Mallampati: III  TM Distance: >3 FB Neck ROM: Full    Dental  (+) Poor Dentition, Missing   Pulmonary neg pulmonary ROS   Pulmonary exam normal        Cardiovascular hypertension, Pt. on medications Normal cardiovascular exam     Neuro/Psych  Neuromuscular disease  negative psych ROS   GI/Hepatic negative GI ROS, Neg liver ROS,,,  Endo/Other  diabetes, Poorly Controlled, Oral Hypoglycemic Agents, Insulin  Dependent    Renal/GU negative Renal ROS     Musculoskeletal  (+) Arthritis ,    Abdominal  (+) + obese  Peds  Hematology  (+) Blood dyscrasia, anemia   Anesthesia Other Findings UNROOFED PROSTATE ABSCESS  Reproductive/Obstetrics                             Anesthesia Physical Anesthesia Plan  ASA: 3  Anesthesia Plan: General   Post-op Pain Management:    Induction: Intravenous  PONV Risk Score and Plan: 2 and Ondansetron , Dexamethasone, Midazolam  and Treatment may vary due to age or medical condition  Airway Management Planned: LMA  Additional Equipment:   Intra-op Plan:   Post-operative Plan: Extubation in OR  Informed Consent: I have reviewed the patients History and Physical, chart, labs and discussed the procedure including the risks, benefits and alternatives for the proposed anesthesia with the patient or authorized representative who has indicated his/her understanding and acceptance.     Dental advisory given  Plan Discussed with: CRNA  Anesthesia Plan Comments:         Anesthesia Quick Evaluation

## 2023-12-02 NOTE — Anesthesia Procedure Notes (Signed)
 Procedure Name: LMA Insertion Date/Time: 12/02/2023 12:42 PM  Performed by: Cena Epps, CRNAPre-anesthesia Checklist: Patient identified, Emergency Drugs available, Suction available and Patient being monitored Patient Re-evaluated:Patient Re-evaluated prior to induction Oxygen Delivery Method: Circle System Utilized Preoxygenation: Pre-oxygenation with 100% oxygen Induction Type: IV induction Ventilation: Mask ventilation without difficulty LMA: LMA inserted LMA Size: 4.0 Number of attempts: 1 Airway Equipment and Method: Bite block Placement Confirmation: positive ETCO2 Tube secured with: Tape Dental Injury: Teeth and Oropharynx as per pre-operative assessment

## 2023-12-02 NOTE — Brief Op Note (Signed)
 12/02/2023  1:27 PM  PATIENT:  Martin [name]  58 y.o. male  PRE-OPERATIVE DIAGNOSIS:  Very Large PROSTATE ABSCESS  POST-OPERATIVE DIAGNOSIS:  Very Large Prostate Abscess  PROCEDURE:  Procedure(s): CYSTOSCOPY WITH TURP (TRANSURETHRAL RESECTION OF PROSTATE) /ABSCESS UNROOFING (N/A)  SURGEON:  Surgeons and Role:    * Manny, Ricardo KATHEE Raddle., MD - Primary  PHYSICIAN ASSISTANT:   ASSISTANTS: none   ANESTHESIA:   general  EBL:  20mL   BLOOD ADMINISTERED:none  DRAINS: 23F 3 way foley to NS irigation   LOCAL MEDICATIONS USED:  NONE  SPECIMEN:  Source of Specimen:  1 - prostate abscess fluid (microbiology); 2 - prostate chips (pathology)  DISPOSITION OF SPECIMEN:  PATHOLOGY  COUNTS:  YES  TOURNIQUET:  * No tourniquets in log *  DICTATION: .Other Dictation: Dictation Number [id number]  PLAN OF CARE: Admit to inpatient   PATIENT DISPOSITION:  PACU - hemodynamically stable.   Delay start of Pharmacological VTE agent (>24hrs) due to surgical blood loss or risk of bleeding: yes

## 2023-12-02 NOTE — Op Note (Unsigned)
 NAME: Gillingham, Cecilia G. MEDICAL RECORD NO: [id number] ACCOUNT NO: [id number] DATE OF BIRTH: 29-Nov-1965 FACILITY: [facility] LOCATION: [id number] PHYSICIAN: Ricardo Likens, MD  Operative Report   DATE OF PROCEDURE: 12/02/2023  PREOPERATIVE DIAGNOSES:  Very large prostate abscess, bilateral epididymo orchitis.  PROCEDURE PERFORMED:  Transurethral resection of the prostate and prostate abscess unroofing.  ESTIMATED BLOOD LOSS:  20 mL.  COMPLICATIONS:  None.  FINDINGS: 1.  Massive near annular prostate abscess as anticipated. 2.  Wide open urinary channel from the bladder neck to the verumontanum post transurethral resection.  DRAINS:  24-French 3-way Foley catheter, normal saline irrigation.  SPECIMEN: 1.  Prostate fluid Gram stain and culture. 2.  Prostate chips for permanent pathology.  INDICATIONS:  The patient is a 58 year old man with a history of severe hyperglycemia with A1c greater than 11.  He follows with endocrinology for this.  He was found on workup of irritative voiding, obstructive voiding, scrotal swelling, and rectal pain  to have a massive prostate abscess on axial imaging as well as severe bilateral epididymo orchitis.  He is known to have MRSA bacteruria earlier this year.  Initial evaluation was performed at a referring med center.  I recommended transfer to [facility] for further aggressive management with IV antibiotics and surgical drainage of the large abscess.  He presented yesterday and was not floridly septic whatsoever.  He was placed on IV antibiotics with plan for surgery today.  Informed  consent was obtained and placed in medical record.  DESCRIPTION OF PROCEDURE:  The patient was being identified and verified.  Procedure being transurethral resection of the prostates and prostate unroofing of abscess was confirmed.  Procedure time out was performed.  Intervenous antibiotics were  administered.  General anesthesia induced.  The patient was placed into a  low lithotomy position.  Sterile field was created, prepped and draped the patient's penis, perineum, proximal thighs using iodine and in-situ catheter was removed.   Cystourethroscopy performed with a 26-French resectoscope sheath with the visual obturator.  The anterior and posterior urethra revealed very swollen and high-riding prostate consistent with likely abscess.  Urinary bladder was inflamed, but no papillary  lesions or calcifications noted.  Next, using a resectoscope loop, transurethral resection was performed first in a top-down fashion versus 12 o'clock position from the bladder neck to the verumontanum down to the superficial fibromuscular stroma of the  prostate capsule and then of the right lobe.  Taken exquisite care to avoid undermining in the bladder neck.  During resection at essentially at all points, multiple abscess cavities that were small were entered and then at the approximately 6 o'clock  to 9 o'clock position, an enormous abscess cavity was entered as anticipated.  This generated a significant amount of purulence, a sample of which was taken for Gram stain and culture.  The abscess cavity was gently massaged with a cystoscope loop, but  purposely not resected or energized given likely close proximity to rectum.  This expelled an incredible amount of pus into the urinary channel, which was irrigated away.  Next, the left lobe of the prostate was resected similarly in a top-down fashion.   Another abscess cavity was entered that appeared to connect to the previous one, thus confirming a near annular type orientation of the cavity.  Additional purulent material was expelled from this.  This was at approximately the 4 o'clock position.   Having very well drained and communicated the large abscess cavity to the urinary channel and allowed a  more obstructed path for urine.  We achieved the goals of the surgery today.  Prostate chips were irrigated and set aside for permanent pathology.    The resection area was carefully fulgurated in a descending spiral fashion.  A sensory wire was advanced to the level of the urinary bladder over which a 3-way 24-French hematuria catheter was carefully placed using Seldinger technique.  30 mL sterile water in  the balloon.  This was led to normal saline irrigation.  The efflux being light pink and procedure was terminated.  The patient tolerated the procedure well.  No immediate periprocedural complications.  The patient was taken to the postanesthesia care  unit in stable condition.  Plan for continued inpatient admission and aggressive IV antibiotics.   PUS D: 12/02/2023 1:34:33 pm T: 12/02/2023 4:03:00 pm  JOB: [id number]/ [id number]

## 2023-12-02 NOTE — Anesthesia Postprocedure Evaluation (Signed)
 Anesthesia Post Note  Patient: Martin [name]  Procedure(s) Performed: CYSTOSCOPY WITH TURP (TRANSURETHRAL RESECTION OF PROSTATE) /ABSCESS UNROOFING (Prostate)     Patient location during evaluation: PACU Anesthesia Type: General Level of consciousness: awake Pain management: pain level controlled Vital Signs Assessment: post-procedure vital signs reviewed and stable Respiratory status: spontaneous breathing, nonlabored ventilation and respiratory function stable Cardiovascular status: blood pressure returned to baseline and stable Postop Assessment: no apparent nausea or vomiting Anesthetic complications: no   No notable events documented.  Last Vitals:  Vitals:   12/02/23 1430 12/02/23 1448  BP: (!) 114/57 (!) 142/76  Pulse: 70 70  Resp: 15 16  Temp:  (!) 36.3 C  SpO2: 100% 97%    Last Pain:  Vitals:   12/02/23 1503  TempSrc:   PainSc: 3                  Myrlene Riera P Trew Sunde

## 2023-12-02 NOTE — Progress Notes (Signed)
 PROGRESS NOTE  Martin [name]  [name]:[id number] DOB: 12/24/1965 DOA: 12/01/2023 PCP: [name], PA-C   Brief Narrative: Patient is a 58 year old male with history of insulin -dependent diabetes type 2, hypertension who presented with rectal pain for 2 weeks.  On presentation he was hypertensive, afebrile.  Ultrasound of the scrotum showed acute right epididymitis, acute left epididymoorchitis, small complex right hydrocele.  CT pelvis showed multiple loculated fluid collection with thin and moderately concerning was occupying the majority of the prostate gland, consistent with prostate abscess, bladder outlet obstruction due to enlarged prostate gland.  Urology consulted.  Plan for TURP, unroofing of the prostate abscess.  On broad-spectrum antibiotics  Assessment & Plan:  Principal Problem:   Prostate abscess Active Problems:   Insulin  dependent type 2 diabetes mellitus [facility])   Testicular abscess   Epididymoorchitis  Prostatic abscess/bilateral epididymoorchitis: Urology following.  Presented with leukocytosis but no fever.  Currently on Zosyn.  Continue pain management, supportive care, follow-up cultures.  Urology planning for TURP, unroofing of the prostate abscess  Urinary retention: Secondary to prostatic enlargement.  Foley placed.  Diabetes type 2: A1c of 10.9  On high-dose insulin  at home.  Continue current insulin  regimen.  Monitor blood sugars.  Diabetic coordinator consulted.  He follows with endocrinology, Dr. [name]  Hyponatremia: Continue gentle iv fluids  Hypertension: On lisinopril  at home.         DVT prophylaxis:SCDs Start: 12/01/23 2217     Code Status: Full Code  Family Communication: Wife at bedside  Patient status: Inpatient  Patient is from : Home  Anticipated discharge to: Home  Estimated DC date:1-2 days   Consultants: Urology  Procedures:None yet  Antimicrobials:  Anti-infectives (From admission, onward)    Start     Dose/Rate Route  Frequency Ordered Stop   12/02/23 0000  piperacillin-tazobactam (ZOSYN) IVPB 3.375 g        3.375 g 12.5 mL/hr over 240 Minutes Intravenous Every 8 hours 12/01/23 1910     12/01/23 1800  vancomycin  (VANCOCIN ) IVPB 1000 mg/200 mL premix       Placed in Followed by Linked Group   1,000 mg 200 mL/hr over 60 Minutes Intravenous  Once 12/01/23 1645 12/01/23 1902   12/01/23 1730  piperacillin-tazobactam (ZOSYN) IVPB 3.375 g        3.375 g 100 mL/hr over 30 Minutes Intravenous  Once 12/01/23 1721 12/01/23 1854   12/01/23 1700  gentamicin  (GARAMYCIN ) 600 mg in dextrose  5 % 100 mL IVPB  Status:  Discontinued        7 mg/kg  85.1 kg (Adjusted) 115 mL/hr over 60 Minutes Intravenous  Once 12/01/23 1645 12/01/23 1721   12/01/23 1645  vancomycin  (VANCOCIN ) IVPB 1000 mg/200 mL premix       Placed in Followed by Linked Group   1,000 mg 200 mL/hr over 60 Minutes Intravenous  Once 12/01/23 1645 12/01/23 1759   12/01/23 1615  vancomycin  (VANCOREADY) IVPB 2000 mg/400 mL  Status:  Discontinued        2,000 mg 200 mL/hr over 120 Minutes Intravenous  Once 12/01/23 1614 12/01/23 1645   12/01/23 1615  gentamicin  (GARAMYCIN ) 200 mg in dextrose  5 % 50 mL IVPB  Status:  Discontinued        2 mg/kg  99.8 kg 110 mL/hr over 30 Minutes Intravenous  Once 12/01/23 1614 12/01/23 1645       Subjective:  Patient seen and examined at bedside today.  Hemodynamically stable.  Lying in bed.  Complains  of some pain on the scrotal area.  Does not look in severe distress.  Waiting for surgery today.  Objective: Vitals:   12/01/23 2006 12/01/23 2340 12/02/23 0324 12/02/23 0740  BP: (!) 153/81 (!) 165/75 (!) 171/81 (!) 151/77  Pulse: 83 83 78 72  Resp: 19 17 17    Temp: 98.8 F (37.1 C) 98.8 F (37.1 C) 99.4 F (37.4 C)   TempSrc: Oral Oral Oral   SpO2:  98% 98% 97%  Weight: 98.6 kg     Height: 5' 11 (1.803 m)       Intake/Output Summary (Last 24 hours) at 12/02/2023 0812 Last data filed at 12/02/2023  0421 Gross per 24 hour  Intake 95.3 ml  Output 3250 ml  Net -3154.7 ml   Filed Weights   12/01/23 1142 12/01/23 2006  Weight: 99.8 kg 98.6 kg    Examination:  General exam: Overall comfortable, not in distress HEENT: PERRL Respiratory system:  no wheezes or crackles  Cardiovascular system: S1 & S2 heard, RRR.  Gastrointestinal system: Abdomen is nondistended, soft and nontender. Central nervous system: Alert and oriented Extremities: No edema, no clubbing ,no cyanosis Skin: No rashes, no ulcers,no icterus   GU: Foley, scrotal edema   Data Reviewed: I have personally reviewed following labs and imaging studies  CBC: Recent Labs  Lab 12/01/23 1304 12/02/23 0531  WBC 17.4* 18.6*  NEUTROABS 13.8*  --   HGB 12.0* 10.9*  HCT 36.3* 35.7*  MCV 82.7 89.0  PLT 473* 398   Basic Metabolic Panel: Recent Labs  Lab 12/01/23 1304 12/02/23 0531  NA 134* 132*  K 4.0 3.6  CL 99 100  CO2 22 23  GLUCOSE 163* 174*  BUN 13 11  CREATININE 0.74 0.70  CALCIUM  9.6 8.6*     Recent Results (from the past 240 hours)  Culture, blood (Routine X 2) w Reflex to ID Panel     Status: None (Preliminary result)   Collection Time: 12/01/23 10:30 PM   Specimen: BLOOD LEFT ARM  Result Value Ref Range Status   Specimen Description   Final    BLOOD LEFT ARM Performed at [facility], [address] [place], [place] [address]    Special Requests   Final    BOTTLES DRAWN AEROBIC ONLY Blood Culture results may not be optimal due to an inadequate volume of blood received in culture bottles Performed at [facility], [address] [place], [place] [address]    Culture PENDING  Incomplete   Report Status PENDING  Incomplete     Radiology Studies: CT PELVIS W CONTRAST Result Date: 12/01/2023 CLINICAL DATA:  Hemorrhoids and rectal pain for the past 2 weeks. Clinical concern for possible prostate abscess. Left testicle swelling and pain. Started on antibiotics 1 week  ago. EXAM: CT PELVIS WITH CONTRAST TECHNIQUE: Multidetector CT imaging of the pelvis was performed using the standard protocol following the bolus administration of intravenous contrast. RADIATION DOSE REDUCTION: This exam was performed according to the departmental dose-optimization program which includes automated exposure control, adjustment of the mA and/or kV according to patient size and/or use of iterative reconstruction technique. CONTRAST:  OMNIPAQUE  IOHEXOL  300 MG/ML  SOLN COMPARISON:  08/07/2023. FINDINGS: Urinary Tract: Interval dilated bladder extending into the mid abdomen with mild diffuse wall thickening. Moderate dilatation of both distal ureters with mild progression. Bowel:  Unremarkable visualized pelvic bowel loops. Vascular/Lymphatic: The previously demonstrated 10 mm mildly enlarged left common iliac lymph node is unchanged  on image number 15/2. No other enlarged lymph nodes. No significant vascular abnormalities. Reproductive: Interval significant increase in size of the prostate gland, containing multiple loculated fluid collections with thin and moderately thickened surrounding walls occupying the majority of the prostate gland. The largest of these is located on the right, measuring 7.7 x 3.9 cm on image number 64/2. Other: Stable postsurgical scarring in the anterior abdominal wall and subcutaneous tissues at the level of the umbilicus. Small bilateral inguinal hernias containing fat. Musculoskeletal: Stable probable bone island in the anterior right sacral ala and lumbosacral spine degenerative changes. IMPRESSION: 1. Interval significant increase in size of the prostate gland, containing multiple loculated fluid collections with thin and moderately thickened surrounding walls occupying the majority of the prostate gland. The largest of these is located on the right, measuring 7.7 x 3.9 cm. These are consistent with prostate abscesses. 2. Interval dilated bladder extending into the  mid abdomen with mild diffuse wall thickening. This is consistent with bladder outlet obstruction by the markedly enlarged prostate gland. 3. Moderate dilatation of both distal ureters with mild progression. 4. Stable 10 mm mildly enlarged left common iliac lymph node, most likely reactive. 5. Small bilateral inguinal hernias containing fat. Electronically Signed   By: Elspeth Bathe M.D.   On: 12/01/2023 15:01   US  SCROTUM W/DOPPLER Result Date: 12/01/2023 CLINICAL DATA:  Left scrotal edema and pain EXAM: SCROTAL ULTRASOUND DOPPLER ULTRASOUND OF THE TESTICLES TECHNIQUE: Complete ultrasound examination of the testicles, epididymis, and other scrotal structures was performed. Color and spectral Doppler ultrasound were also utilized to evaluate blood flow to the testicles. COMPARISON:  None Available. FINDINGS: Right testicle Measurements: 5.4 x 2.2 x 3.5 cm. Normal parenchymal echogenicity and echotexture. No intratesticular mass or calcification. Normal color flow vascularity. Left testicle Measurements: 4.9 x 2.9 x 2.8 cm. Normal parenchymal echogenicity and echotexture. No intratesticular mass or calcification identified. Diffusely increased parenchymal vascularity best appreciated on color flow imaging (image # 79). Right epididymis: Thickened and heterogeneous within the tail with diffusely increased vascularity in keeping with changes of acute epididymitis. Left epididymis: Diffusely thickened and heterogeneous with diffusely increased vascularity in keeping with changes of acute epididymitis. Hydrocele: A small complex right hydrocele is present containing extensive septa and internal debris possibly representing a hematocele or pyocele. Varicocele:  None visualized. Pulsed Doppler interrogation of both testes demonstrates normal low resistance arterial and venous waveforms bilaterally. IMPRESSION: 1. Acute right epididymitis. 2. Acute left epididymo-orchitis. 3. Small complex right hydrocele containing  extensive septa and internal debris possibly representing a hematocele or pyocele. Electronically Signed   By: Dorethia Molt M.D.   On: 12/01/2023 14:23    Scheduled Meds:  atorvastatin   80 mg Oral QHS   finasteride   5 mg Oral Daily   insulin  aspart  0-15 Units Subcutaneous TID WC   insulin  glargine-yfgn  40 Units Subcutaneous Daily   lisinopril   10 mg Oral Daily   tamsulosin   0.4 mg Oral Daily   Continuous Infusions:  sodium chloride  75 mL/hr at 12/02/23 0421   piperacillin-tazobactam (ZOSYN)  IV Stopped (12/02/23 0347)     LOS: 1 day   Ivonne Mustache, MD [facility] P6/28/2025, 8:12 AM

## 2023-12-02 NOTE — Transfer of Care (Signed)
 Immediate Anesthesia Transfer of Care Note  Patient: Martin [name]  Procedure(s) Performed: CYSTOSCOPY WITH TURP (TRANSURETHRAL RESECTION OF PROSTATE) /ABSCESS UNROOFING (Prostate)  Patient Location: PACU  Anesthesia Type:General  Level of Consciousness: drowsy and patient cooperative  Airway & Oxygen Therapy: Patient Spontanous Breathing  Post-op Assessment: Report given to RN and Post -op Vital signs reviewed and stable  Post vital signs: Reviewed and stable  Last Vitals:  Vitals Value Taken Time  BP 136/74 12/02/23 13:45  Temp    Pulse 71 12/02/23 13:47  Resp 18 12/02/23 13:47  SpO2 95 % 12/02/23 13:47  Vitals shown include unfiled device data.  Last Pain:  Vitals:   12/02/23 1125  TempSrc:   PainSc: 7       Patients Stated Pain Goal: 3 (12/02/23 0128)  Complications: No notable events documented.

## 2023-12-03 ENCOUNTER — Encounter (HOSPITAL_COMMUNITY): Payer: Self-pay | Admitting: Urology

## 2023-12-03 DIAGNOSIS — N412 Abscess of prostate: Secondary | ICD-10-CM | POA: Diagnosis not present

## 2023-12-03 LAB — CBC
HCT: 33.3 % — ABNORMAL LOW (ref 39.0–52.0)
Hemoglobin: 10.3 g/dL — ABNORMAL LOW (ref 13.0–17.0)
MCH: 27.2 pg (ref 26.0–34.0)
MCHC: 30.9 g/dL (ref 30.0–36.0)
MCV: 88.1 fL (ref 80.0–100.0)
Platelets: 362 10*3/uL (ref 150–400)
RBC: 3.78 MIL/uL — ABNORMAL LOW (ref 4.22–5.81)
RDW: 13.3 % (ref 11.5–15.5)
WBC: 17.8 10*3/uL — ABNORMAL HIGH (ref 4.0–10.5)
nRBC: 0 % (ref 0.0–0.2)

## 2023-12-03 LAB — URINE CULTURE

## 2023-12-03 LAB — BASIC METABOLIC PANEL WITH GFR
Anion gap: 8 (ref 5–15)
BUN: 14 mg/dL (ref 6–20)
CO2: 21 mmol/L — ABNORMAL LOW (ref 22–32)
Calcium: 8.2 mg/dL — ABNORMAL LOW (ref 8.9–10.3)
Chloride: 104 mmol/L (ref 98–111)
Creatinine, Ser: 0.64 mg/dL (ref 0.61–1.24)
GFR, Estimated: >60 mL/min (ref >60)
Glucose, Bld: 213 mg/dL — ABNORMAL HIGH (ref 70–99)
Potassium: 3.5 mmol/L (ref 3.5–5.1)
Sodium: 133 mmol/L — ABNORMAL LOW (ref 135–145)

## 2023-12-03 LAB — GLUCOSE, CAPILLARY
Glucose-Capillary: 180 mg/dL — ABNORMAL HIGH (ref 70–99)
Glucose-Capillary: 202 mg/dL — ABNORMAL HIGH (ref 70–99)
Glucose-Capillary: 168 mg/dL — ABNORMAL HIGH (ref 70–99)
Glucose-Capillary: 219 mg/dL — ABNORMAL HIGH (ref 70–99)

## 2023-12-03 MED ORDER — HYDROMORPHONE HCL 1 MG/ML IJ SOLN
0.5000 mg | INTRAMUSCULAR | Status: DC | PRN
  Administered 2023-12-04 (×2): 0.5 mg via INTRAVENOUS
  Filled 2023-12-03 (×2): qty 0.5

## 2023-12-03 MED ORDER — BISACODYL 10 MG RE SUPP
10.0000 mg | Freq: Once | RECTAL | Status: AC
  Administered 2023-12-03: 10 mg via RECTAL
  Filled 2023-12-03: qty 1

## 2023-12-03 MED ORDER — POLYETHYLENE GLYCOL 3350 17 G PO PACK
17.0000 g | PACK | Freq: Every day | ORAL | Status: DC
  Administered 2023-12-03: 17 g via ORAL
  Filled 2023-12-03 (×3): qty 1

## 2023-12-03 MED ORDER — SENNOSIDES-DOCUSATE SODIUM 8.6-50 MG PO TABS
1.0000 | ORAL_TABLET | Freq: Two times a day (BID) | ORAL | Status: DC
  Administered 2023-12-03 – 2023-12-06 (×2): 1 via ORAL
  Filled 2023-12-03 (×4): qty 1

## 2023-12-03 MED ORDER — LISINOPRIL 20 MG PO TABS
20.0000 mg | ORAL_TABLET | Freq: Every day | ORAL | Status: DC
  Administered 2023-12-04 – 2023-12-06 (×3): 20 mg via ORAL
  Filled 2023-12-03 (×3): qty 1

## 2023-12-03 MED ORDER — OXYCODONE HCL 5 MG PO TABS
5.0000 mg | ORAL_TABLET | Freq: Four times a day (QID) | ORAL | Status: DC | PRN
  Administered 2023-12-03 – 2023-12-06 (×13): 5 mg via ORAL
  Filled 2023-12-03 (×13): qty 1

## 2023-12-03 MED ORDER — DICLOFENAC SODIUM 1 % EX GEL
2.0000 g | Freq: Four times a day (QID) | CUTANEOUS | Status: DC
  Administered 2023-12-03 – 2023-12-06 (×10): 2 g via TOPICAL
  Filled 2023-12-03: qty 100

## 2023-12-03 NOTE — Progress Notes (Signed)
 PROGRESS NOTE  Martin [name]  [name]:[id number] DOB: 1965/09/29 DOA: 12/01/2023 PCP: [name], PA-C   Brief Narrative: Patient is a 58 year old male with history of insulin -dependent diabetes type 2, hypertension who presented with rectal pain for 2 weeks.  On presentation he was hypertensive, afebrile.  Ultrasound of the scrotum showed acute right epididymitis, acute left epididymoorchitis, small complex right hydrocele.  CT pelvis showed multiple loculated fluid collection with thin and moderately concerning was occupying the majority of the prostate gland, consistent with prostate abscess, bladder outlet obstruction due to enlarged prostate gland.  Urology consulted.  S/P  TURP, unroofing of the prostate abscess.  On broad-spectrum antibiotics.  Wound culture showing gram-positive cocci.  Assessment & Plan:  Principal Problem:   Prostate abscess Active Problems:   Insulin  dependent type 2 diabetes mellitus [facility])   Testicular abscess   Epididymoorchitis  Prostatic abscess/bilateral epididymoorchitis: Urology following.  Presented with leukocytosis but no fever.  Currently on Zosyn.  Continue pain management, supportive care, follow-up cultures.  Wound culture showing gram-positive cocci.  Status post TURP, unroofing of the prostate abscess.  Will follow the culture report and determine the choice of antibiotic for discharge.  Currently has a Foley catheter in, bladder irrigation  Urinary retention: Secondary to prostatic enlargement.  Foley placed.  Diabetes type 2: A1c of 10.9  On high-dose insulin  at home.  Continue current insulin  regimen.  Monitor blood sugars.  Diabetic coordinator consulted.  He follows with endocrinology, Dr. [name]  Hypertension: On lisinopril  at home.  Restarted         DVT prophylaxis:SCDs Start: 12/01/23 2217     Code Status: Full Code  Family Communication: Wife at bedside on 6/29  Patient status: Inpatient  Patient is from :  Home  Anticipated discharge to: Home  Estimated DC date:1-2 days.  Needs urology clearance.  Waiting for culture report   Consultants: Urology  Procedures: TURP  Antimicrobials:  Anti-infectives (From admission, onward)    Start     Dose/Rate Route Frequency Ordered Stop   12/02/23 0000  piperacillin-tazobactam (ZOSYN) IVPB 3.375 g        3.375 g 12.5 mL/hr over 240 Minutes Intravenous Every 8 hours 12/01/23 1910     12/01/23 1800  vancomycin  (VANCOCIN ) IVPB 1000 mg/200 mL premix       Placed in Followed by Linked Group   1,000 mg 200 mL/hr over 60 Minutes Intravenous  Once 12/01/23 1645 12/01/23 1902   12/01/23 1730  piperacillin-tazobactam (ZOSYN) IVPB 3.375 g        3.375 g 100 mL/hr over 30 Minutes Intravenous  Once 12/01/23 1721 12/01/23 1854   12/01/23 1700  gentamicin  (GARAMYCIN ) 600 mg in dextrose  5 % 100 mL IVPB  Status:  Discontinued        7 mg/kg  85.1 kg (Adjusted) 115 mL/hr over 60 Minutes Intravenous  Once 12/01/23 1645 12/01/23 1721   12/01/23 1645  vancomycin  (VANCOCIN ) IVPB 1000 mg/200 mL premix       Placed in Followed by Linked Group   1,000 mg 200 mL/hr over 60 Minutes Intravenous  Once 12/01/23 1645 12/01/23 1759   12/01/23 1615  vancomycin  (VANCOREADY) IVPB 2000 mg/400 mL  Status:  Discontinued        2,000 mg 200 mL/hr over 120 Minutes Intravenous  Once 12/01/23 1614 12/01/23 1645   12/01/23 1615  gentamicin  (GARAMYCIN ) 200 mg in dextrose  5 % 50 mL IVPB  Status:  Discontinued  2 mg/kg  99.8 kg 110 mL/hr over 30 Minutes Intravenous  Once 12/01/23 1614 12/01/23 1645       Subjective:  Patient seen and examined at bedside today.  Overall comfortable.  Hemodynamically stable.  Lying in bed.  Denies any significant pain on the scrotal, perineal area.  Foley bag draining pinkish urine.  Objective: Vitals:   12/02/23 1953 12/02/23 2056 12/03/23 0110 12/03/23 0459  BP: (!) 147/72 131/65 (!) 155/78 (!) 152/81  Pulse: 72 81 67 69  Resp: 20  18 17 18   Temp: 98.3 F (36.8 C) 98.4 F (36.9 C) 98 F (36.7 C) 98.2 F (36.8 C)  TempSrc:  Oral Oral Oral  SpO2: 95% 95% 95% 98%  Weight:      Height:  5' 11 (1.803 m)      Intake/Output Summary (Last 24 hours) at 12/03/2023 1128 Last data filed at 12/03/2023 1123 Gross per 24 hour  Intake 7381.04 ml  Output 78844 ml  Net -13773.96 ml   Filed Weights   12/01/23 1142 12/01/23 2006  Weight: 99.8 kg 98.6 kg    Examination:  General exam: Overall comfortable, not in distress HEENT: PERRL Respiratory system:  no wheezes or crackles  Cardiovascular system: S1 & S2 heard, RRR.  Gastrointestinal system: Abdomen is nondistended, soft and nontender. Central nervous system: Alert and oriented Extremities: No edema, no clubbing ,no cyanosis Skin: No rashes, no ulcers,no icterus   GU: Foley, scrotal edema   Data Reviewed: I have personally reviewed following labs and imaging studies  CBC: Recent Labs  Lab 12/01/23 1304 12/02/23 0531 12/03/23 0544  WBC 17.4* 18.6* 17.8*  NEUTROABS 13.8*  --   --   HGB 12.0* 10.9* 10.3*  HCT 36.3* 35.7* 33.3*  MCV 82.7 89.0 88.1  PLT 473* 398 362   Basic Metabolic Panel: Recent Labs  Lab 12/01/23 1304 12/02/23 0531 12/03/23 0544  NA 134* 132* 133*  K 4.0 3.6 3.5  CL 99 100 104  CO2 22 23 21*  GLUCOSE 163* 174* 213*  BUN 13 11 14   CREATININE 0.74 0.70 0.64  CALCIUM  9.6 8.6* 8.2*     Recent Results (from the past 240 hours)  Urine Culture     Status: Abnormal   Collection Time: 12/01/23  1:04 PM   Specimen: Urine, Clean Catch  Result Value Ref Range Status   Specimen Description   Final    URINE, CLEAN CATCH Performed at [facility], [address] [place], [place] [address]    Special Requests   Final    NONE Performed at [facility], [address] [place], [place] [address]    Culture MULTIPLE SPECIES PRESENT, SUGGEST RECOLLECTION (A)  Final   Report Status 12/03/2023 FINAL  Final   Culture, blood (Routine X 2) w Reflex to ID Panel     Status: None (Preliminary result)   Collection Time: 12/01/23 10:30 PM   Specimen: BLOOD LEFT ARM  Result Value Ref Range Status   Specimen Description   Final    BLOOD LEFT ARM Performed at [facility], [address] [place], [place] [address]    Special Requests   Final    BOTTLES DRAWN AEROBIC ONLY Blood Culture results may not be optimal due to an inadequate volume of blood received in culture bottles Performed at [facility], [address], [place], [place] [address]    Culture   Final    NO GROWTH  2 DAYS Performed at [facility], [address] [place], [place] [address]    Report Status PENDING  Incomplete  Culture, blood (Routine X 2) w Reflex to ID Panel     Status: None (Preliminary result)   Collection Time: 12/01/23 10:30 PM   Specimen: BLOOD  Result Value Ref Range Status   Specimen Description   Final    BLOOD SITE NOT SPECIFIED Performed at [facility], [address], [place], [place] [address]    Special Requests   Final    BOTTLES DRAWN AEROBIC AND ANAEROBIC Blood Culture adequate volume Performed at [facility], [address] [place], [place] [address]    Culture   Final    NO GROWTH 2 DAYS Performed at [facility], [address] [place], [place] [address]    Report Status PENDING  Incomplete  Aerobic/Anaerobic Culture w Gram Stain (surgical/deep wound)     Status: None (Preliminary result)   Collection Time: 12/02/23 12:59 PM   Specimen: Prostate; GU  Result Value Ref Range Status   Specimen Description   Final    PROSTATE Performed at [facility], [address], [place], [place] [address]    Special Requests   Final    NONE Performed at [facility], [address] [place], [place] [address]    Gram Stain   Final    ABUNDANT WBC PRESENT,BOTH PMN AND  MONONUCLEAR MODERATE GRAM POSITIVE COCCI    Culture   Final    TOO YOUNG TO READ Performed at [facility], [address] [place], [place] [address]    Report Status PENDING  Incomplete     Radiology Studies: CT PELVIS W CONTRAST Result Date: 12/01/2023 CLINICAL DATA:  Hemorrhoids and rectal pain for the past 2 weeks. Clinical concern for possible prostate abscess. Left testicle swelling and pain. Started on antibiotics 1 week ago. EXAM: CT PELVIS WITH CONTRAST TECHNIQUE: Multidetector CT imaging of the pelvis was performed using the standard protocol following the bolus administration of intravenous contrast. RADIATION DOSE REDUCTION: This exam was performed according to the departmental dose-optimization program which includes automated exposure control, adjustment of the mA and/or kV according to patient size and/or use of iterative reconstruction technique. CONTRAST:  OMNIPAQUE  IOHEXOL  300 MG/ML  SOLN COMPARISON:  08/07/2023. FINDINGS: Urinary Tract: Interval dilated bladder extending into the mid abdomen with mild diffuse wall thickening. Moderate dilatation of both distal ureters with mild progression. Bowel:  Unremarkable visualized pelvic bowel loops. Vascular/Lymphatic: The previously demonstrated 10 mm mildly enlarged left common iliac lymph node is unchanged on image number 15/2. No other enlarged lymph nodes. No significant vascular abnormalities. Reproductive: Interval significant increase in size of the prostate gland, containing multiple loculated fluid collections with thin and moderately thickened surrounding walls occupying the majority of the prostate gland. The largest of these is located on the right, measuring 7.7 x 3.9 cm on image number 64/2. Other: Stable postsurgical scarring in the anterior abdominal wall and subcutaneous tissues at the level of the umbilicus. Small bilateral inguinal hernias containing fat. Musculoskeletal: Stable probable bone island in the  anterior right sacral ala and lumbosacral spine degenerative changes. IMPRESSION: 1. Interval significant increase in size of the prostate gland, containing multiple loculated fluid collections with thin and moderately thickened surrounding walls occupying the majority of the prostate gland. The largest of these is located on the right, measuring 7.7 x 3.9 cm. These  are consistent with prostate abscesses. 2. Interval dilated bladder extending into the mid abdomen with mild diffuse wall thickening. This is consistent with bladder outlet obstruction by the markedly enlarged prostate gland. 3. Moderate dilatation of both distal ureters with mild progression. 4. Stable 10 mm mildly enlarged left common iliac lymph node, most likely reactive. 5. Small bilateral inguinal hernias containing fat. Electronically Signed   By: Elspeth Bathe M.D.   On: 12/01/2023 15:01   US  SCROTUM W/DOPPLER Result Date: 12/01/2023 CLINICAL DATA:  Left scrotal edema and pain EXAM: SCROTAL ULTRASOUND DOPPLER ULTRASOUND OF THE TESTICLES TECHNIQUE: Complete ultrasound examination of the testicles, epididymis, and other scrotal structures was performed. Color and spectral Doppler ultrasound were also utilized to evaluate blood flow to the testicles. COMPARISON:  None Available. FINDINGS: Right testicle Measurements: 5.4 x 2.2 x 3.5 cm. Normal parenchymal echogenicity and echotexture. No intratesticular mass or calcification. Normal color flow vascularity. Left testicle Measurements: 4.9 x 2.9 x 2.8 cm. Normal parenchymal echogenicity and echotexture. No intratesticular mass or calcification identified. Diffusely increased parenchymal vascularity best appreciated on color flow imaging (image # 79). Right epididymis: Thickened and heterogeneous within the tail with diffusely increased vascularity in keeping with changes of acute epididymitis. Left epididymis: Diffusely thickened and heterogeneous with diffusely increased vascularity in keeping with  changes of acute epididymitis. Hydrocele: A small complex right hydrocele is present containing extensive septa and internal debris possibly representing a hematocele or pyocele. Varicocele:  None visualized. Pulsed Doppler interrogation of both testes demonstrates normal low resistance arterial and venous waveforms bilaterally. IMPRESSION: 1. Acute right epididymitis. 2. Acute left epididymo-orchitis. 3. Small complex right hydrocele containing extensive septa and internal debris possibly representing a hematocele or pyocele. Electronically Signed   By: Dorethia Molt M.D.   On: 12/01/2023 14:23    Scheduled Meds:  atorvastatin   80 mg Oral QHS   Chlorhexidine  Gluconate Cloth  6 each Topical Daily   diclofenac  Sodium  2 g Topical QID   finasteride   5 mg Oral Daily   insulin  aspart  0-15 Units Subcutaneous TID WC   insulin  glargine-yfgn  40 Units Subcutaneous Daily   lisinopril   10 mg Oral Daily   tamsulosin   0.4 mg Oral Daily   Continuous Infusions:  piperacillin-tazobactam (ZOSYN)  IV 3.375 g (12/03/23 0749)   sodium chloride  irrigation       LOS: 2 days   Ivonne Mustache, MD [facility] P6/29/2025, 11:28 AM

## 2023-12-03 NOTE — Progress Notes (Signed)
 1 Day Post-Op   Subjective/Chief Complaint:  1 - Large Prostate Abscess / Bilateral Epidiymorchitis / Non-Compliant Diabetic - s/p OR TURP / prostate abscess unroofing / 3 way foley placement 12/02/23. CX 6/28 GPC  / pending. Scrotal US  also with bilateral epidiymorchitis. UCX 08/2023 MRSA. Plaec on VAnc + Zosyn.    2 - Urinary Retention - massively distended bladder on CT 2025 form prostate abscess. Cr 0.74. Foley placed in ER.     Today Martin [name] is stable. WBC trend down some. Gram stain from prostate abscess fluid GPC (likely MRSA as had prior). No high fevers overnight. He feels like needs to have BM.    Objective: Vital signs in last 24 hours: Temp:  [97.4 F (36.3 C)-99.4 F (37.4 C)] 98.2 F (36.8 C) (06/29 0459) Pulse Rate:  [67-81] 69 (06/29 0459) Resp:  [12-20] 18 (06/29 0459) BP: (114-182)/(57-87) 152/81 (06/29 0459) SpO2:  [95 %-100 %] 98 % (06/29 0459) Last BM Date : 11/30/23  Intake/Output from previous day: 06/28 0701 - 06/29 0700 In: 4381 [I.V.:1343.5; IV Piggyback:37.6] Out: 80319 [Lmpwz:80324; Blood:5] Intake/Output this shift: No intake/output data recorded.  NAD, AOx3, pleasant, non-toxic. Wife at bedside.  Non-labored breathing on RA RRR STable moderate truncal obesity Foely in place with medium yellow urine on irrigation, titrated to near off.    Lab Results:  Recent Labs    12/02/23 0531 12/03/23 0544  WBC 18.6* 17.8*  HGB 10.9* 10.3*  HCT 35.7* 33.3*  PLT 398 362   BMET Recent Labs    12/02/23 0531 12/03/23 0544  NA 132* 133*  K 3.6 3.5  CL 100 104  CO2 23 21*  GLUCOSE 174* 213*  BUN 11 14  CREATININE 0.70 0.64  CALCIUM  8.6* 8.2*   PT/INR No results for input(s): LABPROT, INR in the last 72 hours. ABG No results for input(s): PHART, HCO3 in the last 72 hours.  Invalid input(s): PCO2, PO2  Studies/Results: CT PELVIS W CONTRAST Result Date: 12/01/2023 CLINICAL DATA:  Hemorrhoids and rectal pain for the past 2 weeks.  Clinical concern for possible prostate abscess. Left testicle swelling and pain. Started on antibiotics 1 week ago. EXAM: CT PELVIS WITH CONTRAST TECHNIQUE: Multidetector CT imaging of the pelvis was performed using the standard protocol following the bolus administration of intravenous contrast. RADIATION DOSE REDUCTION: This exam was performed according to the departmental dose-optimization program which includes automated exposure control, adjustment of the mA and/or kV according to patient size and/or use of iterative reconstruction technique. CONTRAST:  OMNIPAQUE  IOHEXOL  300 MG/ML  SOLN COMPARISON:  08/07/2023. FINDINGS: Urinary Tract: Interval dilated bladder extending into the mid abdomen with mild diffuse wall thickening. Moderate dilatation of both distal ureters with mild progression. Bowel:  Unremarkable visualized pelvic bowel loops. Vascular/Lymphatic: The previously demonstrated 10 mm mildly enlarged left common iliac lymph node is unchanged on image number 15/2. No other enlarged lymph nodes. No significant vascular abnormalities. Reproductive: Interval significant increase in size of the prostate gland, containing multiple loculated fluid collections with thin and moderately thickened surrounding walls occupying the majority of the prostate gland. The largest of these is located on the right, measuring 7.7 x 3.9 cm on image number 64/2. Other: Stable postsurgical scarring in the anterior abdominal wall and subcutaneous tissues at the level of the umbilicus. Small bilateral inguinal hernias containing fat. Musculoskeletal: Stable probable bone island in the anterior right sacral ala and lumbosacral spine degenerative changes. IMPRESSION: 1. Interval significant increase in size of the prostate gland, containing  multiple loculated fluid collections with thin and moderately thickened surrounding walls occupying the majority of the prostate gland. The largest of these is located on the right,  measuring 7.7 x 3.9 cm. These are consistent with prostate abscesses. 2. Interval dilated bladder extending into the mid abdomen with mild diffuse wall thickening. This is consistent with bladder outlet obstruction by the markedly enlarged prostate gland. 3. Moderate dilatation of both distal ureters with mild progression. 4. Stable 10 mm mildly enlarged left common iliac lymph node, most likely reactive. 5. Small bilateral inguinal hernias containing fat. Electronically Signed   By: Elspeth Bathe M.D.   On: 12/01/2023 15:01   US  SCROTUM W/DOPPLER Result Date: 12/01/2023 CLINICAL DATA:  Left scrotal edema and pain EXAM: SCROTAL ULTRASOUND DOPPLER ULTRASOUND OF THE TESTICLES TECHNIQUE: Complete ultrasound examination of the testicles, epididymis, and other scrotal structures was performed. Color and spectral Doppler ultrasound were also utilized to evaluate blood flow to the testicles. COMPARISON:  None Available. FINDINGS: Right testicle Measurements: 5.4 x 2.2 x 3.5 cm. Normal parenchymal echogenicity and echotexture. No intratesticular mass or calcification. Normal color flow vascularity. Left testicle Measurements: 4.9 x 2.9 x 2.8 cm. Normal parenchymal echogenicity and echotexture. No intratesticular mass or calcification identified. Diffusely increased parenchymal vascularity best appreciated on color flow imaging (image # 79). Right epididymis: Thickened and heterogeneous within the tail with diffusely increased vascularity in keeping with changes of acute epididymitis. Left epididymis: Diffusely thickened and heterogeneous with diffusely increased vascularity in keeping with changes of acute epididymitis. Hydrocele: A small complex right hydrocele is present containing extensive septa and internal debris possibly representing a hematocele or pyocele. Varicocele:  None visualized. Pulsed Doppler interrogation of both testes demonstrates normal low resistance arterial and venous waveforms bilaterally.  IMPRESSION: 1. Acute right epididymitis. 2. Acute left epididymo-orchitis. 3. Small complex right hydrocele containing extensive septa and internal debris possibly representing a hematocele or pyocele. Electronically Signed   By: Dorethia Molt M.D.   On: 12/01/2023 14:23    Anti-infectives: Anti-infectives (From admission, onward)    Start     Dose/Rate Route Frequency Ordered Stop   12/02/23 0000  piperacillin-tazobactam (ZOSYN) IVPB 3.375 g        3.375 g 12.5 mL/hr over 240 Minutes Intravenous Every 8 hours 12/01/23 1910     12/01/23 1800  vancomycin  (VANCOCIN ) IVPB 1000 mg/200 mL premix       Placed in Followed by Linked Group   1,000 mg 200 mL/hr over 60 Minutes Intravenous  Once 12/01/23 1645 12/01/23 1902   12/01/23 1730  piperacillin-tazobactam (ZOSYN) IVPB 3.375 g        3.375 g 100 mL/hr over 30 Minutes Intravenous  Once 12/01/23 1721 12/01/23 1854   12/01/23 1700  gentamicin  (GARAMYCIN ) 600 mg in dextrose  5 % 100 mL IVPB  Status:  Discontinued        7 mg/kg  85.1 kg (Adjusted) 115 mL/hr over 60 Minutes Intravenous  Once 12/01/23 1645 12/01/23 1721   12/01/23 1645  vancomycin  (VANCOCIN ) IVPB 1000 mg/200 mL premix       Placed in Followed by Linked Group   1,000 mg 200 mL/hr over 60 Minutes Intravenous  Once 12/01/23 1645 12/01/23 1759   12/01/23 1615  vancomycin  (VANCOREADY) IVPB 2000 mg/400 mL  Status:  Discontinued        2,000 mg 200 mL/hr over 120 Minutes Intravenous  Once 12/01/23 1614 12/01/23 1645   12/01/23 1615  gentamicin  (GARAMYCIN ) 200 mg in dextrose  5 % 50  mL IVPB  Status:  Discontinued        2 mg/kg  99.8 kg 110 mL/hr over 30 Minutes Intravenous  Once 12/01/23 1614 12/01/23 1645       Assessment/Plan:  Stable / Improging clincially from severe multi-focal GU infection. High suspicion MRSA given history. Agree with continued ABX until more specific CX results. Continue current catheter. Rec remain in house until afebrile x 24 hours and CX results to  help guide therapy for home.   Appreciate hospitalist team comanagemnt.   Ricardo KATHEE Alvaro Mickey. 12/03/2023

## 2023-12-03 NOTE — Plan of Care (Signed)
  Problem: Education: Goal: Knowledge of General Education information will improve Description: Including pain rating scale, medication(s)/side effects and non-pharmacologic comfort measures Outcome: Progressing   Problem: Health Behavior/Discharge Planning: Goal: Ability to manage health-related needs will improve Outcome: Progressing   Problem: Clinical Measurements: Goal: Ability to maintain clinical measurements within normal limits will improve Outcome: Progressing Goal: Will remain free from infection Outcome: Progressing Goal: Diagnostic test results will improve Outcome: Progressing Goal: Respiratory complications will improve Outcome: Progressing Goal: Cardiovascular complication will be avoided Outcome: Progressing   Problem: Activity: Goal: Risk for activity intolerance will decrease Outcome: Adequate for Discharge   Problem: Nutrition: Goal: Adequate nutrition will be maintained Outcome: Completed/Met   Problem: Coping: Goal: Level of anxiety will decrease Outcome: Progressing   Problem: Elimination: Goal: Will not experience complications related to bowel motility Outcome: Completed/Met Goal: Will not experience complications related to urinary retention Outcome: Progressing   Problem: Pain Managment: Goal: General experience of comfort will improve and/or be controlled Outcome: Progressing   Problem: Safety: Goal: Ability to remain free from injury will improve Outcome: Progressing   Problem: Skin Integrity: Goal: Risk for impaired skin integrity will decrease Outcome: Adequate for Discharge   Problem: Education: Goal: Individualized Educational Video(s) Outcome: Completed/Met   Problem: Nutritional: Goal: Maintenance of adequate nutrition will improve Outcome: Adequate for Discharge   Problem: Skin Integrity: Goal: Risk for impaired skin integrity will decrease Outcome: Adequate for Discharge   Problem: Tissue Perfusion: Goal: Adequacy  of tissue perfusion will improve Outcome: Completed/Met

## 2023-12-04 ENCOUNTER — Inpatient Hospital Stay (HOSPITAL_COMMUNITY)

## 2023-12-04 DIAGNOSIS — N412 Abscess of prostate: Secondary | ICD-10-CM | POA: Diagnosis not present

## 2023-12-04 DIAGNOSIS — D72829 Elevated white blood cell count, unspecified: Secondary | ICD-10-CM

## 2023-12-04 DIAGNOSIS — N451 Epididymitis: Secondary | ICD-10-CM

## 2023-12-04 DIAGNOSIS — B9562 Methicillin resistant Staphylococcus aureus infection as the cause of diseases classified elsewhere: Secondary | ICD-10-CM

## 2023-12-04 LAB — CBC
HCT: 32.8 % — ABNORMAL LOW (ref 39.0–52.0)
Hemoglobin: 10.1 g/dL — ABNORMAL LOW (ref 13.0–17.0)
MCH: 26.8 pg (ref 26.0–34.0)
MCHC: 30.8 g/dL (ref 30.0–36.0)
MCV: 87 fL (ref 80.0–100.0)
Platelets: 410 10*3/uL — ABNORMAL HIGH (ref 150–400)
RBC: 3.77 MIL/uL — ABNORMAL LOW (ref 4.22–5.81)
RDW: 13.2 % (ref 11.5–15.5)
WBC: 14.2 10*3/uL — ABNORMAL HIGH (ref 4.0–10.5)
nRBC: 0 % (ref 0.0–0.2)

## 2023-12-04 LAB — SEDIMENTATION RATE: Sed Rate: 75 mm/h — ABNORMAL HIGH (ref 0–16)

## 2023-12-04 LAB — GLUCOSE, CAPILLARY
Glucose-Capillary: 114 mg/dL — ABNORMAL HIGH (ref 70–99)
Glucose-Capillary: 205 mg/dL — ABNORMAL HIGH (ref 70–99)
Glucose-Capillary: 137 mg/dL — ABNORMAL HIGH (ref 70–99)
Glucose-Capillary: 145 mg/dL — ABNORMAL HIGH (ref 70–99)
Glucose-Capillary: 95 mg/dL (ref 70–99)

## 2023-12-04 LAB — MRSA NEXT GEN BY PCR, NASAL: MRSA by PCR Next Gen: NOT DETECTED

## 2023-12-04 LAB — C-REACTIVE PROTEIN: CRP: 9.3 mg/dL — ABNORMAL HIGH (ref <1.0)

## 2023-12-04 MED ORDER — EMPAGLIFLOZIN 25 MG PO TABS
25.0000 mg | ORAL_TABLET | Freq: Every day | ORAL | Status: DC
  Administered 2023-12-04 – 2023-12-06 (×2): 25 mg via ORAL
  Filled 2023-12-04 (×3): qty 1

## 2023-12-04 MED ORDER — METFORMIN HCL 500 MG PO TABS
1000.0000 mg | ORAL_TABLET | Freq: Two times a day (BID) | ORAL | Status: DC
  Administered 2023-12-04 – 2023-12-06 (×3): 1000 mg via ORAL
  Filled 2023-12-04 (×4): qty 2

## 2023-12-04 MED ORDER — AMLODIPINE BESYLATE 5 MG PO TABS
5.0000 mg | ORAL_TABLET | Freq: Every day | ORAL | Status: DC
  Administered 2023-12-04 – 2023-12-06 (×3): 5 mg via ORAL
  Filled 2023-12-04 (×3): qty 1

## 2023-12-04 MED ORDER — VANCOMYCIN HCL 2000 MG/400ML IV SOLN
2000.0000 mg | Freq: Once | INTRAVENOUS | Status: AC
  Administered 2023-12-04: 2000 mg via INTRAVENOUS
  Filled 2023-12-04: qty 400

## 2023-12-04 MED ORDER — VANCOMYCIN HCL 1500 MG/300ML IV SOLN
1500.0000 mg | Freq: Two times a day (BID) | INTRAVENOUS | Status: DC
  Administered 2023-12-05: 1500 mg via INTRAVENOUS
  Filled 2023-12-04 (×2): qty 300

## 2023-12-04 NOTE — Progress Notes (Signed)
 2 Days Post-Op  Subjective: Martin [name] is doing well 2 days out from a TURP of a prostatic abscess.  He has no significant complaints and the urine is clear in the foley tubing.   His WBC is declining and his Cr is normal.   The wound culture grew Mx species but he had MRSA on the culture on 11/15/23.    ROS:  Review of Systems  Constitutional:  Negative for chills and fever.  All other systems reviewed and are negative.   Anti-infectives: Anti-infectives (From admission, onward)    Start     Dose/Rate Route Frequency Ordered Stop   12/02/23 0000  piperacillin-tazobactam (ZOSYN) IVPB 3.375 g        3.375 g 12.5 mL/hr over 240 Minutes Intravenous Every 8 hours 12/01/23 1910     12/01/23 1800  vancomycin  (VANCOCIN ) IVPB 1000 mg/200 mL premix       Placed in Followed by Linked Group   1,000 mg 200 mL/hr over 60 Minutes Intravenous  Once 12/01/23 1645 12/01/23 1902   12/01/23 1730  piperacillin-tazobactam (ZOSYN) IVPB 3.375 g        3.375 g 100 mL/hr over 30 Minutes Intravenous  Once 12/01/23 1721 12/01/23 1854   12/01/23 1700  gentamicin  (GARAMYCIN ) 600 mg in dextrose  5 % 100 mL IVPB  Status:  Discontinued        7 mg/kg  85.1 kg (Adjusted) 115 mL/hr over 60 Minutes Intravenous  Once 12/01/23 1645 12/01/23 1721   12/01/23 1645  vancomycin  (VANCOCIN ) IVPB 1000 mg/200 mL premix       Placed in Followed by Linked Group   1,000 mg 200 mL/hr over 60 Minutes Intravenous  Once 12/01/23 1645 12/01/23 1759   12/01/23 1615  vancomycin  (VANCOREADY) IVPB 2000 mg/400 mL  Status:  Discontinued        2,000 mg 200 mL/hr over 120 Minutes Intravenous  Once 12/01/23 1614 12/01/23 1645   12/01/23 1615  gentamicin  (GARAMYCIN ) 200 mg in dextrose  5 % 50 mL IVPB  Status:  Discontinued        2 mg/kg  99.8 kg 110 mL/hr over 30 Minutes Intravenous  Once 12/01/23 1614 12/01/23 1645       Current Facility-Administered Medications  Medication Dose Route Frequency Provider Last Rate Last Admin    atorvastatin  (LIPITOR) tablet 80 mg  80 mg Oral QHS Manny, Theodore B Jr., MD   80 mg at 12/03/23 2158   Chlorhexidine  Gluconate Cloth 2 % PADS 6 each  6 each Topical Daily Manny, Theodore B Jr., MD   6 each at 12/03/23 1015   diclofenac  Sodium (VOLTAREN ) 1 % topical gel 2 g  2 g Topical QID Jillian Buttery, MD   2 g at 12/03/23 2159   finasteride  (PROSCAR ) tablet 5 mg  5 mg Oral Daily Manny, Theodore B Jr., MD   5 mg at 12/03/23 9075   hydrALAZINE (APRESOLINE) injection 10 mg  10 mg Intravenous Q6H PRN Manny, Theodore B Jr., MD   10 mg at 12/02/23 1125   HYDROmorphone  (DILAUDID ) injection 0.5 mg  0.5 mg Intravenous Q3H PRN Jillian Buttery, MD       insulin  aspart (novoLOG ) injection 0-15 Units  0-15 Units Subcutaneous TID WC Alvaro Theodore B Jr., MD   3 Units at 12/03/23 1621   insulin  glargine-yfgn (SEMGLEE ) injection 40 Units  40 Units Subcutaneous Daily Manny, Theodore B Jr., MD   40 Units at 12/03/23 9074   lisinopril  (ZESTRIL ) tablet 20 mg  20  mg Oral Daily Jillian Buttery, MD   20 mg at 12/04/23 0746   naloxone [facility]) injection 0.4 mg  0.4 mg Intravenous PRN Manny, Theodore B Jr., MD       oxyCODONE  (Oxy IR/ROXICODONE ) immediate release tablet 5 mg  5 mg Oral Q6H PRN Adhikari, Amrit, MD   5 mg at 12/04/23 0626   piperacillin-tazobactam (ZOSYN) IVPB 3.375 g  3.375 g Intravenous Q8H Alvaro Ricardo KATHEE Mickey., MD 12.5 mL/hr at 12/04/23 0751 3.375 g at 12/04/23 0751   polyethylene glycol (MIRALAX / GLYCOLAX) packet 17 g  17 g Oral Daily Jillian Buttery, MD   17 g at 12/03/23 1315   senna-docusate (Senokot-S) tablet 1 tablet  1 tablet Oral BID Jillian Buttery, MD   1 tablet at 12/03/23 1315   sodium chloride  irrigation 0.9 % 3,000 mL  3,000 mL Irrigation Continuous Manny, Theodore B Jr., MD   3,000 mL at 12/04/23 9372   tamsulosin  (FLOMAX ) capsule 0.4 mg  0.4 mg Oral Daily Alvaro Ricardo KATHEE Mickey., MD   0.4 mg at 12/03/23 9074     Objective: Vital signs in last 24 hours: Temp:  [98.1 F (36.7  C)-98.2 F (36.8 C)] 98.2 F (36.8 C) (06/30 0457) Pulse Rate:  [62-72] 72 (06/30 0457) Resp:  [17-18] 17 (06/30 0457) BP: (157-170)/(74-85) 158/85 (06/30 0457) SpO2:  [95 %-100 %] 95 % (06/30 0457)  Intake/Output from previous day: 06/29 0701 - 06/30 0700 In: 3340 [P.O.:240; IV Piggyback:100] Out: 9250 [Urine:9250] Intake/Output this shift: Total I/O In: -  Out: 525 [Urine:525]   Physical Exam Vitals reviewed.  Constitutional:      Appearance: Normal appearance.  Genitourinary:    Comments: Urine clear in tubing.   Neurological:     Mental Status: He is alert.     Lab Results:  Recent Labs    12/03/23 0544 12/04/23 0530  WBC 17.8* 14.2*  HGB 10.3* 10.1*  HCT 33.3* 32.8*  PLT 362 410*   BMET Recent Labs    12/02/23 0531 12/03/23 0544  NA 132* 133*  K 3.6 3.5  CL 100 104  CO2 23 21*  GLUCOSE 174* 213*  BUN 11 14  CREATININE 0.70 0.64  CALCIUM  8.6* 8.2*   PT/INR No results for input(s): LABPROT, INR in the last 72 hours. ABG No results for input(s): PHART, HCO3 in the last 72 hours.  Invalid input(s): PCO2, PO2 Culture from 11/15/23 at [facility] reviewed.   Studies/Results: No results found. CT's Reviewed.   Assessment and Plan: Prostatic abscess with epididymo-orchitis.   He is doing well s/p TUR of the abscess and is afebrile.  Culture on 11/15/23 grew MRSA sens to SMX/TMP.  He could be transitioned to an oral agent.    I think it would be worthwhile to repeat a pelvic CT to assess the extent of the abscess since there was extension beyond the prostate capsule into the perirectal tissues.   I would leave the catheter for about a week and we could have him come to the office for a voiding trial.     I requested a work excuse note at my office for his wife to pick up.     LOS: 3 days    [name] Seltzer 6/30/2025Patient ID: Martin [name], male   DOB: Jun 13, 1965, 58 y.o.   MRN: [id number]

## 2023-12-04 NOTE — Progress Notes (Signed)
 Pharmacy Antibiotic Note  Martin [name] is a 58 y.o. male admitted on 12/01/2023 with staph aureus prostatic abscess.  Pharmacy has been consulted for Vancomycin  dosing.  Plan: - Vancomycin  2g IV x1 followed by 1500 mg IV every 12h (eAUC 452, SCr 0.8, Vd 0.72) - Will continue to follow renal function, culture results, LOT, and antibiotic de-escalation plans   Height: 5' 11 (180.3 cm) Weight: 98.6 kg (217 lb 6 oz) IBW/kg (Calculated) : 75.3  Temp (24hrs), Avg:98.3 F (36.8 C), Min:98.1 F (36.7 C), Max:98.7 F (37.1 C)  Recent Labs  Lab 12/01/23 1304 12/02/23 0531 12/03/23 0544 12/04/23 0530  WBC 17.4* 18.6* 17.8* 14.2*  CREATININE 0.74 0.70 0.64  --     Estimated Creatinine Clearance: 120.4 mL/min (by C-G formula based on SCr of 0.64 mg/dL).    No Known Allergies  Antimicrobials this admission: Vancomycin  6/27 x 1; restart 6/30 Zosyn 6/27 >> 6/30  Dose adjustments this admission:   Microbiology results: 6/27 UCx >> mult species 6/27 BCx >> [id number] 6/28 prostate cx >> abundant staph aureus 6/30 MRSA PCR >> neg  Thank you for allowing pharmacy to be a part of this patient's care.  Almarie Lunger, PharmD, BCPS, BCIDP Infectious Diseases Clinical Pharmacist 12/04/2023 2:09 PM   **Pharmacist phone directory can now be found on [email or web address] ([email or web address]).  Listed under [facility].

## 2023-12-04 NOTE — Progress Notes (Signed)
 PROGRESS NOTE  Martin [name]  [name]:[id number] DOB: 21-Aug-1965 DOA: 12/01/2023 PCP: [name], PA-C   Brief Narrative: Patient is a 58 year old male with history of insulin -dependent diabetes type 2, hypertension who presented with rectal pain for 2 weeks.  On presentation he was hypertensive, afebrile.  Ultrasound of the scrotum showed acute right epididymitis, acute left epididymoorchitis, small complex right hydrocele.  CT pelvis showed multiple loculated fluid collection with thin and moderately concerning was occupying the majority of the prostate gland, consistent with prostate abscess, bladder outlet obstruction due to enlarged prostate gland.  Urology consulted.  S/P  TURP, unroofing of the prostate abscess.  On broad-spectrum antibiotics.  Wound culture showing staph, sensitivity pending.  ID also following.  Plan for repeat CT pelvis today  Assessment & Plan:  Principal Problem:   Prostate abscess Active Problems:   Insulin  dependent type 2 diabetes mellitus [facility])   Testicular abscess   Epididymoorchitis  Prostatic abscess/bilateral epididymoorchitis: Urology following.  Presented with leukocytosis but no fever.  Currently on Zosyn.  Continue pain management, supportive care, follow-up cultures.  Wound culture showing staph. Status post TURP, unroofing of the prostate abscess.  ID to follow the culture report and determine the choice of antibiotic for discharge.  Currently has a Foley catheter in, bladder irrigation. Patient will be discharged with Foley catheter.  Plan for follow-up CT pelvis today.  Urinary retention: Secondary to prostatic enlargement.  Foley placed.  Continue Foley until you follow-up with urology as an outpatient.  Diabetes type 2: A1c of 10.9  On high-dose insulin  at home.  Continue current insulin  regimen.  Monitor blood sugars.  Diabetic coordinator consulted.  He follows with endocrinology, Dr. [name]  Hypertension: On lisinopril  at home.   Restarted  Right knee discomfort: PT consulted.         DVT prophylaxis:SCDs Start: 12/01/23 2217     Code Status: Full Code  Family Communication: Wife at bedside on 6/30  Patient status: Inpatient  Patient is from : Home  Anticipated discharge to: Home  Estimated DC date: Tomorrow  Consultants: Urology  Procedures: TURP  Antimicrobials:  Anti-infectives (From admission, onward)    Start     Dose/Rate Route Frequency Ordered Stop   12/02/23 0000  piperacillin-tazobactam (ZOSYN) IVPB 3.375 g        3.375 g 12.5 mL/hr over 240 Minutes Intravenous Every 8 hours 12/01/23 1910     12/01/23 1800  vancomycin  (VANCOCIN ) IVPB 1000 mg/200 mL premix       Placed in Followed by Linked Group   1,000 mg 200 mL/hr over 60 Minutes Intravenous  Once 12/01/23 1645 12/01/23 1902   12/01/23 1730  piperacillin-tazobactam (ZOSYN) IVPB 3.375 g        3.375 g 100 mL/hr over 30 Minutes Intravenous  Once 12/01/23 1721 12/01/23 1854   12/01/23 1700  gentamicin  (GARAMYCIN ) 600 mg in dextrose  5 % 100 mL IVPB  Status:  Discontinued        7 mg/kg  85.1 kg (Adjusted) 115 mL/hr over 60 Minutes Intravenous  Once 12/01/23 1645 12/01/23 1721   12/01/23 1645  vancomycin  (VANCOCIN ) IVPB 1000 mg/200 mL premix       Placed in Followed by Linked Group   1,000 mg 200 mL/hr over 60 Minutes Intravenous  Once 12/01/23 1645 12/01/23 1759   12/01/23 1615  vancomycin  (VANCOREADY) IVPB 2000 mg/400 mL  Status:  Discontinued        2,000 mg 200 mL/hr over 120 Minutes Intravenous  Once 12/01/23 1614 12/01/23 1645   12/01/23 1615  gentamicin  (GARAMYCIN ) 200 mg in dextrose  5 % 50 mL IVPB  Status:  Discontinued        2 mg/kg  99.8 kg 110 mL/hr over 30 Minutes Intravenous  Once 12/01/23 1614 12/01/23 1645       Subjective:  Patient seen and examined at bedside today.  Comfortable.  Denies any significant pain on the scrotum/pelvic area.  Complains of some tightness on the right knee.  Currently on  Foley catheter   Objective: Vitals:   12/03/23 1213 12/03/23 1617 12/03/23 2023 12/04/23 0457  BP: (!) 170/78 (!) 157/77 (!) 160/74 (!) 158/85  Pulse: 69 62 67 72  Resp: 18  18 17   Temp: 98.2 F (36.8 C)  98.1 F (36.7 C) 98.2 F (36.8 C)  TempSrc: Oral  Oral Oral  SpO2: 100%  99% 95%  Weight:      Height:        Intake/Output Summary (Last 24 hours) at 12/04/2023 1250 Last data filed at 12/04/2023 1231 Gross per 24 hour  Intake 580 ml  Output 89549 ml  Net -9870 ml   Filed Weights   12/01/23 1142 12/01/23 2006  Weight: 99.8 kg 98.6 kg    Examination:  General exam: Overall comfortable, not in distress HEENT: PERRL Respiratory system:  no wheezes or crackles  Cardiovascular system: S1 & S2 heard, RRR.  Gastrointestinal system: Abdomen is nondistended, soft and nontender. Central nervous system: Alert and oriented Extremities: No edema, no clubbing ,no cyanosis Skin: No rashes, no ulcers,no icterus GU: Foley, scrotal edema   Data Reviewed: I have personally reviewed following labs and imaging studies  CBC: Recent Labs  Lab 12/01/23 1304 12/02/23 0531 12/03/23 0544 12/04/23 0530  WBC 17.4* 18.6* 17.8* 14.2*  NEUTROABS 13.8*  --   --   --   HGB 12.0* 10.9* 10.3* 10.1*  HCT 36.3* 35.7* 33.3* 32.8*  MCV 82.7 89.0 88.1 87.0  PLT 473* 398 362 410*   Basic Metabolic Panel: Recent Labs  Lab 12/01/23 1304 12/02/23 0531 12/03/23 0544  NA 134* 132* 133*  K 4.0 3.6 3.5  CL 99 100 104  CO2 22 23 21*  GLUCOSE 163* 174* 213*  BUN 13 11 14   CREATININE 0.74 0.70 0.64  CALCIUM  9.6 8.6* 8.2*     Recent Results (from the past 240 hours)  Urine Culture     Status: Abnormal   Collection Time: 12/01/23  1:04 PM   Specimen: Urine, Clean Catch  Result Value Ref Range Status   Specimen Description   Final    URINE, CLEAN CATCH Performed at [facility], [address] [place], [place] [address]    Special Requests   Final    NONE Performed at [facility], [address] [place], [place] [address]    Culture MULTIPLE SPECIES PRESENT, SUGGEST RECOLLECTION (A)  Final   Report Status 12/03/2023 FINAL  Final  Culture, blood (Routine X 2) w Reflex to ID Panel     Status: None (Preliminary result)   Collection Time: 12/01/23 10:30 PM   Specimen: BLOOD LEFT ARM  Result Value Ref Range Status   Specimen Description   Final    BLOOD LEFT ARM Performed at [facility], [address], [place], [place] [address]    Special Requests   Final    BOTTLES DRAWN AEROBIC ONLY Blood Culture results may not be optimal due  to an inadequate volume of blood received in culture bottles Performed at [facility], [address], [place], [place] [address]    Culture   Final    NO GROWTH 3 DAYS Performed at [facility], [address] [place], [place] [address]    Report Status PENDING  Incomplete  Culture, blood (Routine X 2) w Reflex to ID Panel     Status: None (Preliminary result)   Collection Time: 12/01/23 10:30 PM   Specimen: BLOOD  Result Value Ref Range Status   Specimen Description   Final    BLOOD SITE NOT SPECIFIED Performed at [facility], [address] [place], [place] [address]    Special Requests   Final    BOTTLES DRAWN AEROBIC AND ANAEROBIC Blood Culture adequate volume Performed at [facility], [address] [place], [place] [address]    Culture   Final    NO GROWTH 3 DAYS Performed at [facility], [address] [place], [place] [address]    Report Status PENDING  Incomplete  Aerobic/Anaerobic Culture w Gram Stain (surgical/deep wound)     Status: None (Preliminary result)   Collection Time: 12/02/23 12:59 PM   Specimen: Prostate; GU  Result Value Ref Range Status   Specimen Description   Final    PROSTATE Performed at [facility], [address], [place], [place] [address]    Special Requests   Final     NONE Performed at [facility], [address], [place], [place] [address]    Gram Stain   Final    ABUNDANT WBC PRESENT,BOTH PMN AND MONONUCLEAR MODERATE GRAM POSITIVE COCCI    Culture   Final    ABUNDANT STAPHYLOCOCCUS AUREUS SUSCEPTIBILITIES TO FOLLOW Performed at [facility], [address], [place], [place] [address]    Report Status PENDING  Incomplete  MRSA Next Gen by PCR, Nasal     Status: None   Collection Time: 12/04/23  9:49 AM   Specimen: Nasal Mucosa; Nasal Swab  Result Value Ref Range Status   MRSA by PCR Next Gen NOT DETECTED NOT DETECTED Final    Comment: (NOTE) The GeneXpert MRSA Assay (FDA approved for NASAL specimens only), is one component of a comprehensive MRSA colonization surveillance program. It is not intended to diagnose MRSA infection nor to guide or monitor treatment for MRSA infections. Test performance is not FDA approved in patients less than 36 years old. Performed at [facility], [address] [place], [place] [address]      Radiology Studies: No results found.   Scheduled Meds:  atorvastatin   80 mg Oral QHS   Chlorhexidine  Gluconate Cloth  6 each Topical Daily   diclofenac  Sodium  2 g Topical QID   empagliflozin   25 mg Oral Daily   finasteride   5 mg Oral Daily   insulin  aspart  0-15 Units Subcutaneous TID WC   insulin  glargine-yfgn  40 Units Subcutaneous Daily   lisinopril   20 mg Oral Daily   metFORMIN   1,000 mg Oral BID WC   polyethylene glycol  17 g Oral Daily   senna-docusate  1 tablet Oral BID   tamsulosin   0.4 mg Oral Daily   Continuous Infusions:  piperacillin-tazobactam (ZOSYN)  IV 3.375 g (12/04/23 0751)   sodium chloride  irrigation       LOS: 3 days   Ivonne Mustache, MD [facility] P6/30/2025, 12:50 PM

## 2023-12-04 NOTE — Inpatient Diabetes Management (Signed)
 Inpatient Diabetes Program Recommendations  AACE/ADA: New Consensus Statement on Inpatient Glycemic Control (2015)  Target Ranges:  Prepandial:   less than 140 mg/dL      Peak postprandial:   less than 180 mg/dL (1-2 hours)      Critically ill patients:  140 - 180 mg/dL   Lab Results  Component Value Date   GLUCAP 205 (H) 12/04/2023   HGBA1C 10.9 (H) 12/01/2023    Review of Glycemic Control  Diabetes history: DM2 Outpatient Diabetes medications: Tresiba  110 units daily, Jardiance  25 daily, metformin  1000 mg BID Current orders for Inpatient glycemic control: Semglee  40 daily, Novolog  0-15 TID with meals, Jardiance  25 mg daily  HgbA1C - 10.9% CBGs 114, 205, 137, 145  Inpatient Diabetes Program Recommendations:    Consider adding Novolog  3 units TID with meals if eating > 50%.   Would likely benefit from adding Novolog  s/s and/or meal coverage at discharge, to improve glycemic control.  Will need PCP or Endo to manage his diabetes. States he is not going back to current Endo and does not want to see his current PCP again. Explained to pt importance of MD managing his diabetes.   Continue to follow.  Thank you. Shona Brandy, RD, LDN, CDCES Inpatient Diabetes Coordinator [phone]

## 2023-12-04 NOTE — Progress Notes (Signed)
   12/04/23 0906  TOC Brief Assessment  Insurance and Status Reviewed  Patient has primary care physician Yes  Home environment has been reviewed single family home  Prior level of function: independent  Prior/Current Home Services No current home services  Social Drivers of Health Review SDOH reviewed no interventions necessary  Readmission risk has been reviewed Yes  Transition of care needs no transition of care needs at this time    Heather Saltness, MSW, LCSW 12/04/2023 9:07 AM

## 2023-12-04 NOTE — Consult Note (Signed)
 [facility]  Total days of antibiotics 4       Reason for Consult:prostatic abscess   Referring Physician: jillian  Principal Problem:   Prostate abscess Active Problems:   Insulin  dependent type 2 diabetes mellitus (HCC)   Testicular abscess   Epididymoorchitis    HPI: Martin [name] is a 58 y.o. male T2DM, poorly controlled, Hgb A1c of 11+, difficulty with voiding, scrotal swelling and rectal pain. He was found to have large prostatic abscess with bilateral epididymorchitis, He underwent TURP and prostate abscess unroofing. Prior to admission, he had urine cx showing MRSA on 6/11, and had been placed on bactrim  DS BID. OR cx showing staph aureus but sensitivities are pending   Past Medical History:  Diagnosis Date   Arthritis    Chest pain    Diabetes mellitus    Gout    History of kidney stones    Hyperlipidemia    Hypertension    Obesity     Allergies: No Known Allergies  MEDICATIONS:  atorvastatin   80 mg Oral QHS   Chlorhexidine  Gluconate Cloth  6 each Topical Daily   diclofenac  Sodium  2 g Topical QID   empagliflozin   25 mg Oral Daily   finasteride   5 mg Oral Daily   insulin  aspart  0-15 Units Subcutaneous TID WC   insulin  glargine-yfgn  40 Units Subcutaneous Daily   lisinopril   20 mg Oral Daily   metFORMIN   1,000 mg Oral BID WC   polyethylene glycol  17 g Oral Daily   senna-docusate  1 tablet Oral BID   tamsulosin   0.4 mg Oral Daily    Social History   Tobacco Use   Smoking status: Never   Smokeless tobacco: Current    Types: Chew  Vaping Use   Vaping status: Never Used  Substance Use Topics   Alcohol use: Yes    Alcohol/week: 0.0 standard drinks of alcohol    Comment: occ   Drug use: No    Family History  Problem Relation Age of Onset   Diabetes Maternal Grandfather    Kidney disease Other     Review of Systems -  +rectal pain, testicular pain-- now improving. Chills, subjective fevers prior to admission. 12  point ros is otherwise negative  OBJECTIVE: Temp:  [98.1 F (36.7 C)-98.2 F (36.8 C)] 98.2 F (36.8 C) (06/30 0457) Pulse Rate:  [62-72] 72 (06/30 0457) Resp:  [17-18] 17 (06/30 0457) BP: (157-160)/(74-85) 158/85 (06/30 0457) SpO2:  [95 %-99 %] 95 % (06/30 0457) Physical Exam  Constitutional: He is oriented to person, place, and time. He appears well-developed and well-nourished. No distress.  HENT:  Mouth/Throat: Oropharynx is clear and moist. No oropharyngeal exudate.  Cardiovascular: Normal rate, regular rhythm and normal heart sounds. Exam reveals no gallop and no friction rub.  No murmur heard.  Pulmonary/Chest: Effort normal and breath sounds normal. No respiratory distress. He has no wheezes.  Abdominal: Soft. Bowel sounds are normal. He exhibits no distension. There is no tenderness.  Gu = foley catheter- clear yellow urine Neurological: He is alert and oriented to person, place, and time.  Skin: Skin is warm and dry. No rash noted. No erythema.  Psychiatric: He has a normal mood and affect. His behavior is normal.    LABS: Results for orders placed or performed during the hospital encounter of 12/01/23 (from the past 48 hours)  Aerobic/Anaerobic Culture w Gram Stain (surgical/deep wound)     Status: None (  Preliminary result)   Collection Time: 12/02/23 12:59 PM   Specimen: Prostate; GU  Result Value Ref Range   Specimen Description      PROSTATE Performed at [facility], [address] [place], [place] [address]    Special Requests      NONE Performed at [facility], [address] [place], [place] [address]    Gram Stain      ABUNDANT WBC PRESENT,BOTH PMN AND MONONUCLEAR MODERATE GRAM POSITIVE COCCI    Culture      ABUNDANT STAPHYLOCOCCUS AUREUS SUSCEPTIBILITIES TO FOLLOW Performed at [facility], [address] [place], [place] [address]    Report Status PENDING   Glucose, capillary     Status: Abnormal    Collection Time: 12/02/23  1:43 PM  Result Value Ref Range   Glucose-Capillary 121 (H) 70 - 99 mg/dL    Comment: Glucose reference range applies only to samples taken after fasting for at least 8 hours.  Glucose, capillary     Status: Abnormal   Collection Time: 12/02/23  6:41 PM  Result Value Ref Range   Glucose-Capillary 244 (H) 70 - 99 mg/dL    Comment: Glucose reference range applies only to samples taken after fasting for at least 8 hours.  CBC     Status: Abnormal   Collection Time: 12/03/23  5:44 AM  Result Value Ref Range   WBC 17.8 (H) 4.0 - 10.5 K/uL   RBC 3.78 (L) 4.22 - 5.81 MIL/uL   Hemoglobin 10.3 (L) 13.0 - 17.0 g/dL   HCT 66.6 (L) 60.9 - 47.9 %   MCV 88.1 80.0 - 100.0 fL   MCH 27.2 26.0 - 34.0 pg   MCHC 30.9 30.0 - 36.0 g/dL   RDW 86.6 88.4 - 84.4 %   Platelets 362 150 - 400 K/uL   nRBC 0.0 0.0 - 0.2 %    Comment: Performed at [facility], [address], [place], [place] [address]  Basic metabolic panel with GFR     Status: Abnormal   Collection Time: 12/03/23  5:44 AM  Result Value Ref Range   Sodium 133 (L) 135 - 145 mmol/L   Potassium 3.5 3.5 - 5.1 mmol/L   Chloride 104 98 - 111 mmol/L   CO2 21 (L) 22 - 32 mmol/L   Glucose, Bld 213 (H) 70 - 99 mg/dL    Comment: Glucose reference range applies only to samples taken after fasting for at least 8 hours.   BUN 14 6 - 20 mg/dL   Creatinine, Ser 9.35 0.61 - 1.24 mg/dL   Calcium  8.2 (L) 8.9 - 10.3 mg/dL   GFR, Estimated >39 >39 mL/min    Comment: (NOTE) Calculated using the CKD-EPI Creatinine Equation (2021)    Anion gap 8 5 - 15    Comment: Performed at [facility], [address], [place], [place] [address]  Glucose, capillary     Status: Abnormal   Collection Time: 12/03/23  7:31 AM  Result Value Ref Range   Glucose-Capillary 180 (H) 70 - 99 mg/dL    Comment: Glucose reference range applies only to samples taken after fasting for at least 8 hours.  Glucose, capillary      Status: Abnormal   Collection Time: 12/03/23 11:08 AM  Result Value Ref Range   Glucose-Capillary 202 (H) 70 - 99 mg/dL    Comment: Glucose reference range applies only to samples taken after fasting for at  least 8 hours.  Glucose, capillary     Status: Abnormal   Collection Time: 12/03/23  4:06 PM  Result Value Ref Range   Glucose-Capillary 168 (H) 70 - 99 mg/dL    Comment: Glucose reference range applies only to samples taken after fasting for at least 8 hours.  Glucose, capillary     Status: Abnormal   Collection Time: 12/03/23 10:30 PM  Result Value Ref Range   Glucose-Capillary 219 (H) 70 - 99 mg/dL    Comment: Glucose reference range applies only to samples taken after fasting for at least 8 hours.  CBC     Status: Abnormal   Collection Time: 12/04/23  5:30 AM  Result Value Ref Range   WBC 14.2 (H) 4.0 - 10.5 K/uL   RBC 3.77 (L) 4.22 - 5.81 MIL/uL   Hemoglobin 10.1 (L) 13.0 - 17.0 g/dL   HCT 67.1 (L) 60.9 - 47.9 %   MCV 87.0 80.0 - 100.0 fL   MCH 26.8 26.0 - 34.0 pg   MCHC 30.8 30.0 - 36.0 g/dL   RDW 86.7 88.4 - 84.4 %   Platelets 410 (H) 150 - 400 K/uL   nRBC 0.0 0.0 - 0.2 %    Comment: Performed at [facility], [address] [place], [place] [address]  Glucose, capillary     Status: Abnormal   Collection Time: 12/04/23  7:25 AM  Result Value Ref Range   Glucose-Capillary 114 (H) 70 - 99 mg/dL    Comment: Glucose reference range applies only to samples taken after fasting for at least 8 hours.  MRSA Next Gen by PCR, Nasal     Status: None   Collection Time: 12/04/23  9:49 AM   Specimen: Nasal Mucosa; Nasal Swab  Result Value Ref Range   MRSA by PCR Next Gen NOT DETECTED NOT DETECTED    Comment: (NOTE) The GeneXpert MRSA Assay (FDA approved for NASAL specimens only), is one component of a comprehensive MRSA colonization surveillance program. It is not intended to diagnose MRSA infection nor to guide or monitor treatment for MRSA  infections. Test performance is not FDA approved in patients less than 24 years old. Performed at [facility], [address], [place], [place] [address]   Glucose, capillary     Status: Abnormal   Collection Time: 12/04/23 11:09 AM  Result Value Ref Range   Glucose-Capillary 205 (H) 70 - 99 mg/dL    Comment: Glucose reference range applies only to samples taken after fasting for at least 8 hours.    MICRO: pending IMAGING: No results found.  HISTORICAL MICRO/IMAGING: march 2025 Methicillin resistant staphylococcus aureus      MIC    CIPROFLOXACIN  >=8 RESISTANT Resistant    GENTAMICIN  <=0.5 SENSI... Sensitive    Inducible Clindamycin  NEGATIVE Sensitive    LINEZOLID 2 SENSITIVE Sensitive    NITROFURANTOIN  <=16 SENSIT... Sensitive    OXACILLIN >=4 RESISTANT Resistant    RIFAMPIN <=0.5 SENSI... Sensitive    TETRACYCLINE >=16 RESIST... Resistant    TRIMETH /SULFA  <=10 SENSIT... Sensitive    VANCOMYCIN  1 SENSITIVE Sensitive    Assessment/Plan:  58yo M with poorly controlled T2DM with large prostatic abscess with bilateral epidydimo-orchitis s/p TURP/unroofing. Cx will likely be MRSA. He only received 1 dose of vancomycin  on 6/27 at time of surgery - recommend to restart vancomycin  - will await sensitivities from micro results that should be out tomorrow to decide on final regimen - will check sed rate and crp -  anticipate minimum of 2 wks of treatment   Leukocytosis = improving since surgical debridement  T2DM = will need to be better controlled to help with management of infection  Continue on contact isolation  evaluation of this patient requires complex antimicrobial therapy evaluation and counseling and isolation needs for disease transmission risk assessment and mitigation.   Montie FURY Luiz MD MPH [facility] [phone]

## 2023-12-05 ENCOUNTER — Telehealth (HOSPITAL_COMMUNITY): Payer: Self-pay | Admitting: Pharmacy Technician

## 2023-12-05 ENCOUNTER — Inpatient Hospital Stay (HOSPITAL_COMMUNITY)

## 2023-12-05 ENCOUNTER — Other Ambulatory Visit (HOSPITAL_COMMUNITY): Payer: Self-pay

## 2023-12-05 DIAGNOSIS — M1A0610 Idiopathic chronic gout, right knee, without tophus (tophi): Secondary | ICD-10-CM

## 2023-12-05 DIAGNOSIS — M1A0620 Idiopathic chronic gout, left knee, without tophus (tophi): Secondary | ICD-10-CM

## 2023-12-05 DIAGNOSIS — N412 Abscess of prostate: Secondary | ICD-10-CM | POA: Diagnosis not present

## 2023-12-05 LAB — BASIC METABOLIC PANEL WITH GFR
Anion gap: 9 (ref 5–15)
BUN: 10 mg/dL (ref 6–20)
CO2: 24 mmol/L (ref 22–32)
Calcium: 8.6 mg/dL — ABNORMAL LOW (ref 8.9–10.3)
Chloride: 98 mmol/L (ref 98–111)
Creatinine, Ser: 0.64 mg/dL (ref 0.61–1.24)
GFR, Estimated: >60 mL/min (ref >60)
Glucose, Bld: 98 mg/dL (ref 70–99)
Potassium: 3.3 mmol/L — ABNORMAL LOW (ref 3.5–5.1)
Sodium: 131 mmol/L — ABNORMAL LOW (ref 135–145)

## 2023-12-05 LAB — CBC
HCT: 33.8 % — ABNORMAL LOW (ref 39.0–52.0)
Hemoglobin: 10.6 g/dL — ABNORMAL LOW (ref 13.0–17.0)
MCH: 26.6 pg (ref 26.0–34.0)
MCHC: 31.4 g/dL (ref 30.0–36.0)
MCV: 84.7 fL (ref 80.0–100.0)
Platelets: 423 10*3/uL — ABNORMAL HIGH (ref 150–400)
RBC: 3.99 MIL/uL — ABNORMAL LOW (ref 4.22–5.81)
RDW: 13.1 % (ref 11.5–15.5)
WBC: 17.2 10*3/uL — ABNORMAL HIGH (ref 4.0–10.5)
nRBC: 0 % (ref 0.0–0.2)

## 2023-12-05 LAB — GLUCOSE, CAPILLARY
Glucose-Capillary: 107 mg/dL — ABNORMAL HIGH (ref 70–99)
Glucose-Capillary: 66 mg/dL — ABNORMAL LOW (ref 70–99)
Glucose-Capillary: 139 mg/dL — ABNORMAL HIGH (ref 70–99)
Glucose-Capillary: 168 mg/dL — ABNORMAL HIGH (ref 70–99)
Glucose-Capillary: 276 mg/dL — ABNORMAL HIGH (ref 70–99)

## 2023-12-05 LAB — SURGICAL PATHOLOGY

## 2023-12-05 LAB — URIC ACID: Uric Acid, Serum: 4.9 mg/dL (ref 3.7–8.6)

## 2023-12-05 MED ORDER — POTASSIUM CHLORIDE CRYS ER 20 MEQ PO TBCR
40.0000 meq | EXTENDED_RELEASE_TABLET | Freq: Once | ORAL | Status: AC
  Administered 2023-12-05: 40 meq via ORAL
  Filled 2023-12-05: qty 2

## 2023-12-05 MED ORDER — LINEZOLID 600 MG PO TABS
600.0000 mg | ORAL_TABLET | Freq: Two times a day (BID) | ORAL | Status: DC
  Administered 2023-12-05 – 2023-12-06 (×3): 600 mg via ORAL
  Filled 2023-12-05 (×3): qty 1

## 2023-12-05 MED ORDER — INSULIN ASPART 100 UNIT/ML IJ SOLN
4.0000 [IU] | Freq: Once | INTRAMUSCULAR | Status: AC
  Administered 2023-12-06: 4 [IU] via SUBCUTANEOUS

## 2023-12-05 MED ORDER — COLCHICINE 0.6 MG PO TABS
0.6000 mg | ORAL_TABLET | Freq: Every day | ORAL | Status: DC
  Administered 2023-12-05 – 2023-12-06 (×2): 0.6 mg via ORAL
  Filled 2023-12-05 (×2): qty 1

## 2023-12-05 MED ORDER — METHYLPREDNISOLONE SODIUM SUCC 125 MG IJ SOLR: 125.0000 mg | Freq: Once | INTRAMUSCULAR | Status: DC

## 2023-12-05 MED ORDER — HYDROMORPHONE HCL 1 MG/ML IJ SOLN
1.0000 mg | INTRAMUSCULAR | Status: DC | PRN
  Administered 2023-12-05 – 2023-12-06 (×3): 1 mg via INTRAVENOUS
  Filled 2023-12-05 (×5): qty 1

## 2023-12-05 MED ORDER — METHYLPREDNISOLONE SODIUM SUCC 125 MG IJ SOLR
125.0000 mg | Freq: Once | INTRAMUSCULAR | Status: AC
  Administered 2023-12-05: 125 mg via INTRAVENOUS
  Filled 2023-12-05: qty 2

## 2023-12-05 NOTE — Progress Notes (Signed)
 [facility]    Date of Admission:  12/01/2023   Total days of antibiotics 5/day 2 mrsa coverage   ID: Martin [name] is a 58 y.o. male with  MRSA Principal Problem:   Prostate abscess Active Problems:   Insulin  dependent type 2 diabetes mellitus (HCC)   Testicular abscess   Epididymoorchitis    Subjective: Afebrile, but having increasing bilateral knee pain R>L  Medications:   amLODipine  5 mg Oral Daily   atorvastatin   80 mg Oral QHS   Chlorhexidine  Gluconate Cloth  6 each Topical Daily   colchicine   0.6 mg Oral Daily   diclofenac  Sodium  2 g Topical QID   empagliflozin   25 mg Oral Daily   finasteride   5 mg Oral Daily   insulin  aspart  0-15 Units Subcutaneous TID WC   insulin  glargine-yfgn  40 Units Subcutaneous Daily   linezolid  600 mg Oral Q12H   lisinopril   20 mg Oral Daily   metFORMIN   1,000 mg Oral BID WC   polyethylene glycol  17 g Oral Daily   senna-docusate  1 tablet Oral BID   tamsulosin   0.4 mg Oral Daily    Objective: Vital signs in last 24 hours: Temp:  [97.8 F (36.6 C)-99.2 F (37.3 C)] 97.8 F (36.6 C) (07/01 1239) Pulse Rate:  [82-98] 82 (07/01 1239) Resp:  [18-19] 18 (07/01 1239) BP: (146-150)/(69-82) 146/82 (07/01 1239) SpO2:  [94 %-98 %] 98 % (07/01 1239)  Physical Exam  Constitutional: He is oriented to person, place, and time. He appears well-developed and well-nourished. No distress.  HENT:  Mouth/Throat: Oropharynx is clear and moist. No oropharyngeal exudate.  Cardiovascular: Normal rate, regular rhythm and normal heart sounds. Exam reveals no gallop and no friction rub.  No murmur heard.  Pulmonary/Chest: Effort normal and breath sounds normal. No respiratory distress. He has no wheezes.  Zku:mphyu knee is warm with slight effusion Neurological: He is alert and oriented to person, place, and time.  Skin: Skin is warm and dry. No rash noted. No erythema.  Psychiatric: He has a normal mood and affect.  His behavior is normal.    Lab Results Recent Labs    12/03/23 0544 12/04/23 0530 12/05/23 0828  WBC 17.8* 14.2* 17.2*  HGB 10.3* 10.1* 10.6*  HCT 33.3* 32.8* 33.8*  NA 133*  --  131*  K 3.5  --  3.3*  CL 104  --  98  CO2 21*  --  24  BUN 14  --  10  CREATININE 0.64  --  0.64    Sedimentation Rate Recent Labs    12/04/23 1727  ESRSEDRATE 75*   C-Reactive Protein Recent Labs    12/04/23 1727  CRP 9.3*    Microbiology: Methicillin resistant staphylococcus aureus      MIC    CIPROFLOXACIN  >=8 RESISTANT Resistant    CLINDAMYCIN  >=8 RESISTANT Resistant    ERYTHROMYCIN >=8 RESISTANT Resistant    GENTAMICIN  <=0.5 SENSI... Sensitive    Inducible Clindamycin  NEGATIVE Sensitive    LINEZOLID 2 SENSITIVE Sensitive    OXACILLIN >=4 RESISTANT Resistant    RIFAMPIN <=0.5 SENSI... Sensitive    TETRACYCLINE >=16 RESIST... Resistant    TRIMETH /SULFA  <=10 SENSIT... Sensitive    VANCOMYCIN  <=0.5 SENSI... Sensitive    Studies/Results: DG Knee 1-2 Views Right Result Date: 12/05/2023 CLINICAL DATA:  58 year old male with soft tissue swelling. EXAM: RIGHT KNEE - 1-2 VIEW COMPARISON:  None Available. FINDINGS: AP and 2 cross-table  lateral views. Suprapatellar joint effusion appears small. Intact patella. No acute osseous abnormality identified. Maintained joint spaces but medial and lateral compartment Chondrocalcinosis which can be seen in the setting of calcium  pyrophosphate deposition disease. There is some calcified peripheral vascular disease visible. IMPRESSION: 1. Small joint effusion. No acute osseous abnormality identified. 2. Chondrocalcinosis which can be seen in the setting of calcium  pyrophosphate deposition disease. Electronically Signed   By: VEAR Hurst M.D.   On: 12/05/2023 10:40   CT PELVIS WO CONTRAST Result Date: 12/04/2023 CLINICAL DATA:  Follow-up prostatitis and complex prostatic abscess EXAM: CT PELVIS WITHOUT CONTRAST TECHNIQUE: Multidetector CT imaging of the pelvis  was performed following the standard protocol without intravenous contrast. RADIATION DOSE REDUCTION: This exam was performed according to the departmental dose-optimization program which includes automated exposure control, adjustment of the mA and/or kV according to patient size and/or use of iterative reconstruction technique. COMPARISON:  12/01/2023 FINDINGS: Urinary Tract: Bladder is decompressed by Foley catheter. A small amount of air is noted within the bladder related to the catheter placement. Visualized kidneys are unremarkable. Bowel: Visualized bowel shows no obstructive or inflammatory changes. Vascular/Lymphatic: Atherosclerotic calcifications of the aorta are noted. Stable left common iliac lymph node is again seen. Reproductive: Prostate is well visualized. The previously seen large complex abscess has nearly completely resolved. Some mild fullness in the right side of the prostate remains although no discrete fluid collection is seen. Other:  Small fat containing inguinal hernias. Musculoskeletal: Stable right sacral bone island is noted. IMPRESSION: Changes consistent with interval unroofing of the prostatic abscess with near complete resolution of the abnormality. Some fullness in the right half of the prostate is again seen although no discrete fluid collection is noted at this time. Electronically Signed   By: Oneil Devonshire M.D.   On: 12/04/2023 20:04     Assessment/Plan: Mrsa prostatic abscess s/p TURP and unroofed abscess= plan to  treat with 14 day of linezolid 600mg  po bid, and reassess if  need to extend and change to bactrim  ds 2 bid. Avoiding using bactrim  at this time since also getting started on colchicine  for gouty flare.  Repeat ct showing nrear complete resolution  Long term medication= plt at 400s as we are starting linezolid.  Gouty flare of knees = getting started on colchicine   We will see back in clinic in 2-3wk for follow up  [facility] for  Infectious Diseases Pager: [phone]  12/05/2023, 2:58 PM

## 2023-12-05 NOTE — Telephone Encounter (Signed)
 Patient Product/process development scientist completed.    The patient is insured through [facility]. Patient has [employer], may use a copay card, and/or apply for patient assistance if available.    Ran test claim for linezolid 600 mg and the current 14 day co-pay is $5.00.   This test claim was processed through [facility]- copay amounts may vary at other pharmacies due to pharmacy/plan contracts, or as the patient moves through the different stages of their insurance plan.     Reyes Sharps, CPHT Pharmacy Technician III Certified Patient Advocate [facility] Patient Advocate Team Direct Number: [phone]  Fax: [phone]

## 2023-12-05 NOTE — Progress Notes (Addendum)
 PROGRESS NOTE  Martin [name]  [name]:[id number] DOB: 05/13/1966 DOA: 12/01/2023 PCP: [name], PA-C   Brief Narrative: Patient is a 58 year old male with history of insulin -dependent diabetes type 2, hypertension who presented with rectal pain for 2 weeks.  On presentation he was hypertensive, afebrile.  Ultrasound of the scrotum showed acute right epididymitis, acute left epididymoorchitis, small complex right hydrocele.  CT pelvis showed multiple loculated fluid collection with thin and moderately concerning was occupying the majority of the prostate gland, consistent with prostate abscess, bladder outlet obstruction due to enlarged prostate gland.  Urology consulted.  S/P  TURP, unroofing of the prostate abscess.  On broad-spectrum antibiotics.  Wound culture showing staph, sensitivity pending.  ID also following.  Developed severe right knee pain today likely secondary to gout, PT consulted.    Assessment & Plan:  Principal Problem:   Prostate abscess Active Problems:   Insulin  dependent type 2 diabetes mellitus [facility])   Testicular abscess   Epididymoorchitis  Prostatic abscess/bilateral epididymoorchitis: Urology following.  Presented with leukocytosis but no fever.  . Status post TURP, unroofing of the prostate abscess.  Wound culture showing staph.  Currently on vancomycin .  ID was following.  Now the plan is to continue 2 weeks course of of linezolid and follow-up with ID as an outpatient.  Currently has a Foley catheter in, bladder irrigation. Patient will be discharged with Foley catheter. Follow-up CT pelvis showed decompression of the abscess.  Has moderate leukocytosis  Urinary retention: Secondary to prostatic enlargement.  Foley placed.  Continue Foley until you follow-up with urology as an outpatient.  Diabetes type 2: A1c of 10.9  On high-dose insulin  at home.  Continue current insulin  regimen.  Monitor blood sugars.  Diabetic coordinator consulted.  He follows with  endocrinology, Dr. [name]  Hypertension: On lisinopril  at home.  Restarted.Added amlodipine  Right knee discomfort/history of gout: Found to have significant swelling, warmth of right knee.  Has history of gout.  Started on colchicine .Given a dose of solumedrol because of severe pain.  X-rays of the right knee does not show any fracture or dislocation.  Uric acid normal but it may not correlate with acute gout.  Elevated CRP/ESR.  PT also consulted         DVT prophylaxis:SCDs Start: 12/01/23 2217     Code Status: Full Code  Family Communication: Wife at bedside on 7/1  Patient status: Inpatient  Patient is from : Home  Anticipated discharge to: Home  Estimated DC date: Tomorrow  Consultants: Urology,ID  Procedures: TURP  Antimicrobials:  Anti-infectives (From admission, onward)    Start     Dose/Rate Route Frequency Ordered Stop   12/05/23 0400  vancomycin  (VANCOREADY) IVPB 1500 mg/300 mL        1,500 mg 150 mL/hr over 120 Minutes Intravenous Every 12 hours 12/04/23 1409     12/04/23 1430  vancomycin  (VANCOREADY) IVPB 2000 mg/400 mL        2,000 mg 200 mL/hr over 120 Minutes Intravenous  Once 12/04/23 1330 12/04/23 1612   12/02/23 0000  piperacillin-tazobactam (ZOSYN) IVPB 3.375 g  Status:  Discontinued        3.375 g 12.5 mL/hr over 240 Minutes Intravenous Every 8 hours 12/01/23 1910 12/04/23 1339   12/01/23 1800  vancomycin  (VANCOCIN ) IVPB 1000 mg/200 mL premix       Placed in Followed by Linked Group   1,000 mg 200 mL/hr over 60 Minutes Intravenous  Once 12/01/23 1645 12/01/23 1902   12/01/23  1730  piperacillin-tazobactam (ZOSYN) IVPB 3.375 g        3.375 g 100 mL/hr over 30 Minutes Intravenous  Once 12/01/23 1721 12/01/23 1854   12/01/23 1700  gentamicin  (GARAMYCIN ) 600 mg in dextrose  5 % 100 mL IVPB  Status:  Discontinued        7 mg/kg  85.1 kg (Adjusted) 115 mL/hr over 60 Minutes Intravenous  Once 12/01/23 1645 12/01/23 1721   12/01/23 1645  vancomycin   (VANCOCIN ) IVPB 1000 mg/200 mL premix       Placed in Followed by Linked Group   1,000 mg 200 mL/hr over 60 Minutes Intravenous  Once 12/01/23 1645 12/01/23 1759   12/01/23 1615  vancomycin  (VANCOREADY) IVPB 2000 mg/400 mL  Status:  Discontinued        2,000 mg 200 mL/hr over 120 Minutes Intravenous  Once 12/01/23 1614 12/01/23 1645   12/01/23 1615  gentamicin  (GARAMYCIN ) 200 mg in dextrose  5 % 50 mL IVPB  Status:  Discontinued        2 mg/kg  99.8 kg 110 mL/hr over 30 Minutes Intravenous  Once 12/01/23 1614 12/01/23 1645       Subjective:  Patient seen and examined at the bedside today.  Hemodynamically stable.  Lying in bed.  Appears comfortable.  Has some pain and swelling of the right knee.  Right knee appears significantly warm.  Has history of gout.  We discussed about starting colchicine  and getting x-ray.  Patient eager to go home.  Discussed that we need to follow-up with the wound culture report and determine the choice of antibiotic before going home  Objective: Vitals:   12/04/23 0457 12/04/23 1253 12/04/23 1932 12/05/23 0347  BP: (!) 158/85 (!) 172/63 (!) 150/75 (!) 146/69  Pulse: 72 72 98 84  Resp: 17 16 18 19   Temp: 98.2 F (36.8 C) 98.7 F (37.1 C) 99.2 F (37.3 C) 98.1 F (36.7 C)  TempSrc: Oral Oral Oral Oral  SpO2: 95% 100% 95% 94%  Weight:      Height:        Intake/Output Summary (Last 24 hours) at 12/05/2023 1143 Last data filed at 12/05/2023 1024 Gross per 24 hour  Intake 1610 ml  Output 88499 ml  Net -9890 ml   Filed Weights   12/01/23 1142 12/01/23 2006  Weight: 99.8 kg 98.6 kg    Examination:   General exam: Overall comfortable, not in distress HEENT: PERRL Respiratory system:  no wheezes or crackles  Cardiovascular system: S1 & S2 heard, RRR.  Gastrointestinal system: Abdomen is nondistended, soft and nontender. Central nervous system: Alert and oriented Extremities: Edema, warmth of right knee.  Tender right knee Skin: No rashes, no  ulcers,no icterus GU: Foley, scrotal edema   Data Reviewed: I have personally reviewed following labs and imaging studies  CBC: Recent Labs  Lab 12/01/23 1304 12/02/23 0531 12/03/23 0544 12/04/23 0530 12/05/23 0828  WBC 17.4* 18.6* 17.8* 14.2* 17.2*  NEUTROABS 13.8*  --   --   --   --   HGB 12.0* 10.9* 10.3* 10.1* 10.6*  HCT 36.3* 35.7* 33.3* 32.8* 33.8*  MCV 82.7 89.0 88.1 87.0 84.7  PLT 473* 398 362 410* 423*   Basic Metabolic Panel: Recent Labs  Lab 12/01/23 1304 12/02/23 0531 12/03/23 0544 12/05/23 0828  NA 134* 132* 133* 131*  K 4.0 3.6 3.5 3.3*  CL 99 100 104 98  CO2 22 23 21* 24  GLUCOSE 163* 174* 213* 98  BUN 13 11 14  10  CREATININE 0.74 0.70 0.64 0.64  CALCIUM  9.6 8.6* 8.2* 8.6*     Recent Results (from the past 240 hours)  Urine Culture     Status: Abnormal   Collection Time: 12/01/23  1:04 PM   Specimen: Urine, Clean Catch  Result Value Ref Range Status   Specimen Description   Final    URINE, CLEAN CATCH Performed at [facility], [address] [place], [place] [address]    Special Requests   Final    NONE Performed at [facility], [address] [place], [place] [address]    Culture MULTIPLE SPECIES PRESENT, SUGGEST RECOLLECTION (A)  Final   Report Status 12/03/2023 FINAL  Final  Culture, blood (Routine X 2) w Reflex to ID Panel     Status: None (Preliminary result)   Collection Time: 12/01/23 10:30 PM   Specimen: BLOOD LEFT ARM  Result Value Ref Range Status   Specimen Description   Final    BLOOD LEFT ARM Performed at [facility], [address] [place], [place] [address]    Special Requests   Final    BOTTLES DRAWN AEROBIC ONLY Blood Culture results may not be optimal due to an inadequate volume of blood received in culture bottles Performed at [facility], [address], [place], [place] [address]    Culture   Final    NO GROWTH 4 DAYS Performed at [facility], [address] [place], [place] [address]    Report Status PENDING  Incomplete  Culture, blood (Routine X 2) w Reflex to ID Panel     Status: None (Preliminary result)   Collection Time: 12/01/23 10:30 PM   Specimen: BLOOD  Result Value Ref Range Status   Specimen Description   Final    BLOOD SITE NOT SPECIFIED Performed at [facility], [address], [place], [place] [address]    Special Requests   Final    BOTTLES DRAWN AEROBIC AND ANAEROBIC Blood Culture adequate volume Performed at [facility], [address], [place], [place] [address]    Culture   Final    NO GROWTH 4 DAYS Performed at [facility], [address], [place], [place] [address]    Report Status PENDING  Incomplete  Aerobic/Anaerobic Culture w Gram Stain (surgical/deep wound)     Status: None (Preliminary result)   Collection Time: 12/02/23 12:59 PM   Specimen: Prostate; GU  Result Value Ref Range Status   Specimen Description   Final    PROSTATE Performed at [facility], [address], [place], [place] [address]    Special Requests   Final    NONE Performed at [facility] [address] [place], [place] [address]    Gram Stain   Final    ABUNDANT WBC PRESENT,BOTH PMN AND MONONUCLEAR MODERATE GRAM POSITIVE COCCI    Culture   Final    ABUNDANT STAPHYLOCOCCUS AUREUS SUSCEPTIBILITIES TO FOLLOW Performed at [facility], [address] [place], [place] [address]    Report Status PENDING  Incomplete  MRSA Next Gen by PCR, Nasal     Status: None   Collection Time: 12/04/23  9:49 AM   Specimen: Nasal Mucosa; Nasal Swab  Result Value Ref Range Status   MRSA by PCR Next Gen NOT DETECTED NOT DETECTED Final    Comment: (NOTE) The GeneXpert MRSA Assay (FDA  approved for NASAL specimens only), is one component of a comprehensive MRSA colonization surveillance program. It is not intended to diagnose MRSA infection nor to  guide or monitor treatment for MRSA infections. Test performance is not FDA approved in patients less than 35 years old. Performed at [facility], [address], [place], [place] [address]      Radiology Studies: DG Knee 1-2 Views Right Result Date: 12/05/2023 CLINICAL DATA:  58 year old male with soft tissue swelling. EXAM: RIGHT KNEE - 1-2 VIEW COMPARISON:  None Available. FINDINGS: AP and 2 cross-table lateral views. Suprapatellar joint effusion appears small. Intact patella. No acute osseous abnormality identified. Maintained joint spaces but medial and lateral compartment Chondrocalcinosis which can be seen in the setting of calcium  pyrophosphate deposition disease. There is some calcified peripheral vascular disease visible. IMPRESSION: 1. Small joint effusion. No acute osseous abnormality identified. 2. Chondrocalcinosis which can be seen in the setting of calcium  pyrophosphate deposition disease. Electronically Signed   By: VEAR Hurst M.D.   On: 12/05/2023 10:40   CT PELVIS WO CONTRAST Result Date: 12/04/2023 CLINICAL DATA:  Follow-up prostatitis and complex prostatic abscess EXAM: CT PELVIS WITHOUT CONTRAST TECHNIQUE: Multidetector CT imaging of the pelvis was performed following the standard protocol without intravenous contrast. RADIATION DOSE REDUCTION: This exam was performed according to the departmental dose-optimization program which includes automated exposure control, adjustment of the mA and/or kV according to patient size and/or use of iterative reconstruction technique. COMPARISON:  12/01/2023 FINDINGS: Urinary Tract: Bladder is decompressed by Foley catheter. A small amount of air is noted within the bladder related to the catheter placement. Visualized kidneys are unremarkable. Bowel: Visualized bowel shows no obstructive or inflammatory changes. Vascular/Lymphatic: Atherosclerotic calcifications of the aorta are noted. Stable left common iliac lymph node is again  seen. Reproductive: Prostate is well visualized. The previously seen large complex abscess has nearly completely resolved. Some mild fullness in the right side of the prostate remains although no discrete fluid collection is seen. Other:  Small fat containing inguinal hernias. Musculoskeletal: Stable right sacral bone island is noted. IMPRESSION: Changes consistent with interval unroofing of the prostatic abscess with near complete resolution of the abnormality. Some fullness in the right half of the prostate is again seen although no discrete fluid collection is noted at this time. Electronically Signed   By: Oneil Devonshire M.D.   On: 12/04/2023 20:04     Scheduled Meds:  amLODipine  5 mg Oral Daily   atorvastatin   80 mg Oral QHS   Chlorhexidine  Gluconate Cloth  6 each Topical Daily   colchicine   0.6 mg Oral Daily   diclofenac  Sodium  2 g Topical QID   empagliflozin   25 mg Oral Daily   finasteride   5 mg Oral Daily   insulin  aspart  0-15 Units Subcutaneous TID WC   insulin  glargine-yfgn  40 Units Subcutaneous Daily   lisinopril   20 mg Oral Daily   metFORMIN   1,000 mg Oral BID WC   polyethylene glycol  17 g Oral Daily   senna-docusate  1 tablet Oral BID   tamsulosin   0.4 mg Oral Daily   Continuous Infusions:  vancomycin  1,500 mg (12/05/23 0351)     LOS: 4 days   Ivonne Mustache, MD [facility] P7/06/2023, 11:43 AM

## 2023-12-05 NOTE — Plan of Care (Signed)
  Problem: Health Behavior/Discharge Planning: Goal: Ability to manage health-related needs will improve Outcome: Progressing   Problem: Activity: Goal: Risk for activity intolerance will decrease Outcome: Progressing   Problem: Pain Managment: Goal: General experience of comfort will improve and/or be controlled Outcome: Progressing   Problem: Safety: Goal: Ability to remain free from injury will improve Outcome: Progressing

## 2023-12-05 NOTE — Discharge Instructions (Signed)
 I will have the office contact you to come in early next week for catheter removal.

## 2023-12-05 NOTE — Evaluation (Signed)
 Physical Therapy Evaluation Patient Details Name: Martin [name] MRN: [id number] DOB: [birth year] Today's Date: 12/05/2023  History of Present Illness  58 yo male presents to therapy following hospitalization on 12/01/2023 due to pain, hypertension. Pt US  of scrotum concerning for epididymitis, epididymoorchitis and R hydrocele with CT revealing fluid collection in prostate glad, prostate abscess and bladder outlet obstruction. Pt is now s/p TURP on 6/28 and unroofing of the prostate abscess with wound culture indicative of staph. Pt hs new onset of R knee pain 7/1 and attributed to gout. Pt PMH Includes but is not limited to HTN, gout, DM II, angina, kidney stones, HLD, and toe amputation R hallux and L first toe.  Clinical Impression   Pt admitted with above diagnosis.  Pt currently with functional limitations due to the deficits listed below (see PT Problem List). Pt in bed when PT arrived. Nurse provided pt with pain medication at start of eval. Pt indicated that nothing has touched his pain, B knee R > L and groin. Pt reported he could not even try to sit up in the recliner. Pt required min A for supine to sit, mod A for sit to stand  from EOB, mod A for side stepping to R with RW x 3 feet with cues and noted B LE instability and mod A for sit to supine, pt stated pain worse than 10/10. Pt left in bed, pt declining CP and all needs in place. Pt will benefit from acute skilled PT to increase their independence and safety with mobility to allow discharge.         If plan is discharge home, recommend the following: Two people to help with walking and/or transfers;A lot of help with bathing/dressing/bathroom;Assistance with cooking/housework;Assist for transportation;Help with stairs or ramp for entrance   Can travel by private vehicle        Equipment Recommendations Rolling walker (2 wheels)  Recommendations for Other Services       Functional Status Assessment Patient has had a recent  decline in their functional status and demonstrates the ability to make significant improvements in function in a reasonable and predictable amount of time.     Precautions / Restrictions Precautions Precautions: Fall Restrictions Weight Bearing Restrictions Per Provider Order: No      Mobility  Bed Mobility Overal bed mobility: Needs Assistance Bed Mobility: Supine to Sit, Sit to Supine     Supine to sit: HOB elevated, Min assist Sit to supine: Mod assist   General bed mobility comments: pt required cues, increased time and A for B LEs    Transfers Overall transfer level: Needs assistance Equipment used: Rolling walker (2 wheels) Transfers: Sit to/from Stand Sit to Stand: Mod assist, From elevated surface           General transfer comment: pull to stand, mod A for power up and initital standing balance B LE instability noted    Ambulation/Gait Ambulation/Gait assistance: Mod assist Gait Distance (Feet): 3 Feet Assistive device: Rolling walker (2 wheels) Gait Pattern/deviations: Step-to pattern, Antalgic, Trunk flexed Gait velocity: decreased     General Gait Details: pt able to side step with use of Rw, mod A and cues to the R toward [facility], pt reports increased pain > 10/10 and unable to lift and place LEs sliding on floor with progressive trunk, knee and hip flexion with instabiltiy noted  Stairs            Wheelchair Mobility     Tilt Bed  Modified Rankin (Stroke Patients Only)       Balance Overall balance assessment: Needs assistance Sitting-balance support: Feet supported Sitting balance-Leahy Scale: Fair     Standing balance support: Bilateral upper extremity supported, Reliant on assistive device for balance, During functional activity Standing balance-Leahy Scale: Zero                               Pertinent Vitals/Pain Pain Assessment Pain Assessment: 0-10 Pain Score: 10-Worst pain ever Pain Location: B knees and  groin region Pain Descriptors / Indicators: Aching, Constant, Discomfort, Grimacing, Guarding, Moaning, Tender Pain Intervention(s): Limited activity within patient's tolerance, Monitored during session, Premedicated before session, Repositioned (pt declined CP)    Home Living Family/patient expects to be discharged to:: Private residence Living Arrangements: Spouse/significant other Available Help at Discharge: Family Type of Home: House Home Access: Stairs to enter   Secretary/administrator of Steps: 4   Home Layout: One level Home Equipment: None      Prior Function Prior Level of Function : Independent/Modified Independent;Driving;Working/employed             Mobility Comments: IND no AD for all ADLs, self care tasks and IADLS       Extremity/Trunk Assessment        Lower Extremity Assessment Lower Extremity Assessment: Generalized weakness (MMT assessment limited due to pain)    Cervical / Trunk Assessment Cervical / Trunk Assessment: Normal  Communication   Communication Communication: No apparent difficulties    Cognition Arousal: Alert Behavior During Therapy: WFL for tasks assessed/performed   PT - Cognitive impairments: No apparent impairments                         Following commands: Intact       Cueing       General Comments General comments (skin integrity, edema, etc.): R knee edema noted    Exercises     Assessment/Plan    PT Assessment Patient needs continued PT services  PT Problem List Decreased strength;Decreased range of motion;Decreased activity tolerance;Decreased balance;Decreased mobility;Pain       PT Treatment Interventions DME instruction;Gait training;Stair training;Functional mobility training;Therapeutic activities;Therapeutic exercise;Balance training;Neuromuscular re-education;Patient/family education;Modalities    PT Goals (Current goals can be found in the Care Plan section)  Acute Rehab PT  Goals Patient Stated Goal: to be able to walk PT Goal Formulation: With patient Time For Goal Achievement: 12/19/23 Potential to Achieve Goals: Good    Frequency Min 3X/week     Co-evaluation               AM-PAC PT 6 Clicks Mobility  Outcome Measure Help needed turning from your back to your side while in a flat bed without using bedrails?: A Little Help needed moving from lying on your back to sitting on the side of a flat bed without using bedrails?: A Little Help needed moving to and from a bed to a chair (including a wheelchair)?: A Lot Help needed standing up from a chair using your arms (e.g., wheelchair or bedside chair)?: A Lot Help needed to walk in hospital room?: Total Help needed climbing 3-5 steps with a railing? : Total 6 Click Score: 12    End of Session Equipment Utilized During Treatment: Gait belt Activity Tolerance: Patient limited by pain Patient left: in bed;with call bell/phone within reach Nurse Communication: Mobility status;Other (comment) (pain report) PT Visit Diagnosis: Unsteadiness on  feet (R26.81);Other abnormalities of gait and mobility (R26.89);Muscle weakness (generalized) (M62.81);Pain;Difficulty in walking, not elsewhere classified (R26.2) Pain - Right/Left:  (B) Pain - part of body: Knee;Leg    Time: 8655-8595 PT Time Calculation (min) (ACUTE ONLY): 20 min   Charges:   PT Evaluation $PT Eval Low Complexity: 1 Low   PT General Charges $$ ACUTE PT VISIT: 1 Visit         [place], PT Acute Rehab   [place] VEAR Drone 12/05/2023, 4:11 PM

## 2023-12-05 NOTE — Progress Notes (Signed)
 3 Days Post-Op  Subjective: Martin [name] is doing well 3 days out from a TURP of a prostatic abscess. Pelvic CT showed good decompression of the abscess.  He has no significant complaints and the urine is clear in the foley tubing. The wound culture is growing staph but the sensitivities are pending.    He is complaining of pain and stiffness in the right knee and thinks he might be having gout.     ROS:  Review of Systems  Constitutional:  Negative for chills and fever.  Musculoskeletal:  Positive for joint pain.  All other systems reviewed and are negative.   Anti-infectives: Anti-infectives (From admission, onward)    Start     Dose/Rate Route Frequency Ordered Stop   12/05/23 0400  vancomycin  (VANCOREADY) IVPB 1500 mg/300 mL        1,500 mg 150 mL/hr over 120 Minutes Intravenous Every 12 hours 12/04/23 1409     12/04/23 1430  vancomycin  (VANCOREADY) IVPB 2000 mg/400 mL        2,000 mg 200 mL/hr over 120 Minutes Intravenous  Once 12/04/23 1330 12/04/23 1612   12/02/23 0000  piperacillin-tazobactam (ZOSYN) IVPB 3.375 g  Status:  Discontinued        3.375 g 12.5 mL/hr over 240 Minutes Intravenous Every 8 hours 12/01/23 1910 12/04/23 1339   12/01/23 1800  vancomycin  (VANCOCIN ) IVPB 1000 mg/200 mL premix       Placed in Followed by Linked Group   1,000 mg 200 mL/hr over 60 Minutes Intravenous  Once 12/01/23 1645 12/01/23 1902   12/01/23 1730  piperacillin-tazobactam (ZOSYN) IVPB 3.375 g        3.375 g 100 mL/hr over 30 Minutes Intravenous  Once 12/01/23 1721 12/01/23 1854   12/01/23 1700  gentamicin  (GARAMYCIN ) 600 mg in dextrose  5 % 100 mL IVPB  Status:  Discontinued        7 mg/kg  85.1 kg (Adjusted) 115 mL/hr over 60 Minutes Intravenous  Once 12/01/23 1645 12/01/23 1721   12/01/23 1645  vancomycin  (VANCOCIN ) IVPB 1000 mg/200 mL premix       Placed in Followed by Linked Group   1,000 mg 200 mL/hr over 60 Minutes Intravenous  Once 12/01/23 1645 12/01/23 1759   12/01/23 1615   vancomycin  (VANCOREADY) IVPB 2000 mg/400 mL  Status:  Discontinued        2,000 mg 200 mL/hr over 120 Minutes Intravenous  Once 12/01/23 1614 12/01/23 1645   12/01/23 1615  gentamicin  (GARAMYCIN ) 200 mg in dextrose  5 % 50 mL IVPB  Status:  Discontinued        2 mg/kg  99.8 kg 110 mL/hr over 30 Minutes Intravenous  Once 12/01/23 1614 12/01/23 1645       Current Facility-Administered Medications  Medication Dose Route Frequency Provider Last Rate Last Admin   amLODipine (NORVASC) tablet 5 mg  5 mg Oral Daily Adhikari, Amrit, MD   5 mg at 12/04/23 1330   atorvastatin  (LIPITOR) tablet 80 mg  80 mg Oral QHS Manny, Theodore B Jr., MD   80 mg at 12/04/23 2147   Chlorhexidine  Gluconate Cloth 2 % PADS 6 each  6 each Topical Daily Manny, Theodore B Jr., MD   6 each at 12/04/23 0900   diclofenac  Sodium (VOLTAREN ) 1 % topical gel 2 g  2 g Topical QID Jillian Buttery, MD   2 g at 12/04/23 1702   empagliflozin  (JARDIANCE ) tablet 25 mg  25 mg Oral Daily Jillian Buttery, MD   25  mg at 12/04/23 1117   finasteride  (PROSCAR ) tablet 5 mg  5 mg Oral Daily Manny, Theodore B Jr., MD   5 mg at 12/04/23 9079   hydrALAZINE (APRESOLINE) injection 10 mg  10 mg Intravenous Q6H PRN Manny, Theodore B Jr., MD   10 mg at 12/02/23 1125   HYDROmorphone  (DILAUDID ) injection 0.5 mg  0.5 mg Intravenous Q3H PRN Jillian Buttery, MD   0.5 mg at 12/04/23 1614   insulin  aspart (novoLOG ) injection 0-15 Units  0-15 Units Subcutaneous TID WC Manny, Theodore B Jr., MD   2 Units at 12/04/23 1633   insulin  glargine-yfgn (SEMGLEE ) injection 40 Units  40 Units Subcutaneous Daily Manny, Theodore B Jr., MD   40 Units at 12/04/23 0920   lisinopril  (ZESTRIL ) tablet 20 mg  20 mg Oral Daily Jillian Buttery, MD   20 mg at 12/04/23 0746   metFORMIN  (GLUCOPHAGE ) tablet 1,000 mg  1,000 mg Oral BID WC Jillian Buttery, MD   1,000 mg at 12/04/23 1624   naloxone (NARCAN) injection 0.4 mg  0.4 mg Intravenous PRN Manny, Theodore B Jr., MD       oxyCODONE   (Oxy IR/ROXICODONE ) immediate release tablet 5 mg  5 mg Oral Q6H PRN Adhikari, Amrit, MD   5 mg at 12/05/23 0004   polyethylene glycol (MIRALAX / GLYCOLAX) packet 17 g  17 g Oral Daily Jillian Buttery, MD   17 g at 12/03/23 1315   senna-docusate (Senokot-S) tablet 1 tablet  1 tablet Oral BID Jillian Buttery, MD   1 tablet at 12/03/23 1315   tamsulosin  (FLOMAX ) capsule 0.4 mg  0.4 mg Oral Daily Alvaro Ricardo KATHEE Mickey., MD   0.4 mg at 12/04/23 0920   vancomycin  (VANCOREADY) IVPB 1500 mg/300 mL  1,500 mg Intravenous Q12H Gladis Almarie PARAS, RPH 150 mL/hr at 12/05/23 0351 1,500 mg at 12/05/23 0351     Objective: Vital signs in last 24 hours: Temp:  [98.1 F (36.7 C)-99.2 F (37.3 C)] 98.1 F (36.7 C) (07/01 0347) Pulse Rate:  [72-98] 84 (07/01 0347) Resp:  [16-19] 19 (07/01 0347) BP: (146-172)/(63-75) 146/69 (07/01 0347) SpO2:  [94 %-100 %] 94 % (07/01 0347)  Intake/Output from previous day: 06/30 0701 - 07/01 0700 In: 1850 [P.O.:1400; IV Piggyback:450] Out: 87274 [Urine:12725] Intake/Output this shift: No intake/output data recorded.   Physical Exam Vitals reviewed.  Constitutional:      Appearance: Normal appearance.  Genitourinary:    Comments: Urine clear in tubing.  He has persistent swelling and induration of the left testicle and epididymis but no tenderness.   Neurological:     Mental Status: He is alert.     Lab Results:  Recent Labs    12/03/23 0544 12/04/23 0530  WBC 17.8* 14.2*  HGB 10.3* 10.1*  HCT 33.3* 32.8*  PLT 362 410*   BMET Recent Labs    12/03/23 0544  NA 133*  K 3.5  CL 104  CO2 21*  GLUCOSE 213*  BUN 14  CREATININE 0.64  CALCIUM  8.2*   PT/INR No results for input(s): LABPROT, INR in the last 72 hours. ABG No results for input(s): PHART, HCO3 in the last 72 hours.  Invalid input(s): PCO2, PO2 Culture from 11/15/23 at [facility] reviewed.   Studies/Results: CT PELVIS WO CONTRAST Result Date: 12/04/2023 CLINICAL DATA:   Follow-up prostatitis and complex prostatic abscess EXAM: CT PELVIS WITHOUT CONTRAST TECHNIQUE: Multidetector CT imaging of the pelvis was performed following the standard protocol without intravenous contrast. RADIATION DOSE REDUCTION: This exam was  performed according to the departmental dose-optimization program which includes automated exposure control, adjustment of the mA and/or kV according to patient size and/or use of iterative reconstruction technique. COMPARISON:  12/01/2023 FINDINGS: Urinary Tract: Bladder is decompressed by Foley catheter. A small amount of air is noted within the bladder related to the catheter placement. Visualized kidneys are unremarkable. Bowel: Visualized bowel shows no obstructive or inflammatory changes. Vascular/Lymphatic: Atherosclerotic calcifications of the aorta are noted. Stable left common iliac lymph node is again seen. Reproductive: Prostate is well visualized. The previously seen large complex abscess has nearly completely resolved. Some mild fullness in the right side of the prostate remains although no discrete fluid collection is seen. Other:  Small fat containing inguinal hernias. Musculoskeletal: Stable right sacral bone island is noted. IMPRESSION: Changes consistent with interval unroofing of the prostatic abscess with near complete resolution of the abnormality. Some fullness in the right half of the prostate is again seen although no discrete fluid collection is noted at this time. Electronically Signed   By: Oneil Devonshire M.D.   On: 12/04/2023 20:04   CT's Reviewed.   Assessment and Plan: Prostatic abscess with epididymo-orchitis.   He is doing well s/p TUR of the abscess and is afebrile.  CT looked good.  Cx sensitivities pending.    Right knee pain and swelling.  He thinks he has gout.  Further management per med service.   He is ok for discharge from a urology standpoint pending the culture and sensitivity results.      LOS: 4 days    [name] Seltzer 7/1/2025Patient ID: Martin [name], male   DOB: 1966-02-08, 58 y.o.   MRN: [id number] Patient ID: Martin [name], male   DOB: 1965-08-20, 58 y.o.   MRN: [id number]

## 2023-12-05 NOTE — Plan of Care (Signed)
  Problem: Pain Managment: Goal: General experience of comfort will improve and/or be controlled Outcome: Not Progressing   Problem: Activity: Goal: Risk for activity intolerance will decrease Outcome: Not Progressing   Problem: Clinical Measurements: Goal: Ability to maintain clinical measurements within normal limits will improve Outcome: Progressing Goal: Will remain free from infection Outcome: Progressing Goal: Diagnostic test results will improve Outcome: Progressing Goal: Respiratory complications will improve Outcome: Progressing Goal: Cardiovascular complication will be avoided Outcome: Progressing

## 2023-12-06 ENCOUNTER — Other Ambulatory Visit (HOSPITAL_COMMUNITY): Payer: Self-pay

## 2023-12-06 DIAGNOSIS — Z794 Long term (current) use of insulin: Secondary | ICD-10-CM | POA: Diagnosis not present

## 2023-12-06 DIAGNOSIS — N453 Epididymo-orchitis: Secondary | ICD-10-CM | POA: Diagnosis not present

## 2023-12-06 DIAGNOSIS — N412 Abscess of prostate: Secondary | ICD-10-CM | POA: Diagnosis not present

## 2023-12-06 DIAGNOSIS — E119 Type 2 diabetes mellitus without complications: Secondary | ICD-10-CM | POA: Diagnosis not present

## 2023-12-06 LAB — BASIC METABOLIC PANEL WITH GFR
Anion gap: 10 (ref 5–15)
BUN: 20 mg/dL (ref 6–20)
CO2: 22 mmol/L (ref 22–32)
Calcium: 8.9 mg/dL (ref 8.9–10.3)
Chloride: 101 mmol/L (ref 98–111)
Creatinine, Ser: 0.6 mg/dL — ABNORMAL LOW (ref 0.61–1.24)
GFR, Estimated: >60 mL/min (ref >60)
Glucose, Bld: 232 mg/dL — ABNORMAL HIGH (ref 70–99)
Potassium: 4.1 mmol/L (ref 3.5–5.1)
Sodium: 133 mmol/L — ABNORMAL LOW (ref 135–145)

## 2023-12-06 LAB — CULTURE, BLOOD (ROUTINE X 2)
Culture: NO GROWTH
Culture: NO GROWTH
Special Requests: ADEQUATE

## 2023-12-06 LAB — CBC
HCT: 35 % — ABNORMAL LOW (ref 39.0–52.0)
Hemoglobin: 11.3 g/dL — ABNORMAL LOW (ref 13.0–17.0)
MCH: 27 pg (ref 26.0–34.0)
MCHC: 32.3 g/dL (ref 30.0–36.0)
MCV: 83.5 fL (ref 80.0–100.0)
Platelets: 427 10*3/uL — ABNORMAL HIGH (ref 150–400)
RBC: 4.19 MIL/uL — ABNORMAL LOW (ref 4.22–5.81)
RDW: 13.2 % (ref 11.5–15.5)
WBC: 18.9 10*3/uL — ABNORMAL HIGH (ref 4.0–10.5)
nRBC: 0 % (ref 0.0–0.2)

## 2023-12-06 LAB — GLUCOSE, CAPILLARY
Glucose-Capillary: 212 mg/dL — ABNORMAL HIGH (ref 70–99)
Glucose-Capillary: 223 mg/dL — ABNORMAL HIGH (ref 70–99)

## 2023-12-06 MED ORDER — PREDNISONE 20 MG PO TABS
40.0000 mg | ORAL_TABLET | Freq: Every day | ORAL | Status: DC
  Administered 2023-12-06: 40 mg via ORAL
  Filled 2023-12-06: qty 2

## 2023-12-06 MED ORDER — SENNOSIDES-DOCUSATE SODIUM 8.6-50 MG PO TABS
1.0000 | ORAL_TABLET | Freq: Every evening | ORAL | 0 refills | Status: AC | PRN
  Filled 2023-12-06: qty 30, 30d supply, fill #0

## 2023-12-06 MED ORDER — AMLODIPINE BESYLATE 5 MG PO TABS
5.0000 mg | ORAL_TABLET | Freq: Every day | ORAL | 2 refills | Status: AC
  Filled 2023-12-06: qty 30, 30d supply, fill #0

## 2023-12-06 MED ORDER — OXYCODONE HCL 5 MG PO TABS
5.0000 mg | ORAL_TABLET | Freq: Four times a day (QID) | ORAL | 0 refills | Status: AC | PRN
  Filled 2023-12-06: qty 15, 4d supply, fill #0

## 2023-12-06 MED ORDER — COLCHICINE 0.6 MG PO TABS
0.6000 mg | ORAL_TABLET | Freq: Every day | ORAL | 0 refills | Status: DC
  Filled 2023-12-06: qty 30, 30d supply, fill #0

## 2023-12-06 MED ORDER — LISINOPRIL 20 MG PO TABS
20.0000 mg | ORAL_TABLET | Freq: Every day | ORAL | 3 refills | Status: AC
  Filled 2023-12-06: qty 30, 30d supply, fill #0
  Filled 2024-02-09: qty 30, 30d supply, fill #1

## 2023-12-06 MED ORDER — LINEZOLID 600 MG PO TABS
600.0000 mg | ORAL_TABLET | Freq: Two times a day (BID) | ORAL | 0 refills | Status: AC
  Filled 2023-12-06: qty 26, 13d supply, fill #0

## 2023-12-06 MED ORDER — TAMSULOSIN HCL 0.4 MG PO CAPS: 0.4000 mg | ORAL_CAPSULE | Freq: Every day | ORAL | Status: AC

## 2023-12-06 MED ORDER — DICLOFENAC SODIUM 1 % EX GEL
2.0000 g | Freq: Four times a day (QID) | CUTANEOUS | 0 refills | Status: AC | PRN
  Filled 2023-12-06: qty 100, 30d supply, fill #0

## 2023-12-06 NOTE — Progress Notes (Signed)
 4 Days Post-Op  Subjective: Martin [name] is doing well 4 days out from a TURP of a prostatic abscess. Pelvic CT showed good decompression of the abscess.  He has no significant complaints and the urine is clear in the foley tubing.  Wound culture positive for MRSA with limited oral options.  Recovering from gout flare, colchicine  and prednisone  per primary team.  Long conversation with he and his wife.  All questions were answered to their satisfaction.     ROS:  Review of Systems  Constitutional:  Negative for chills and fever.  Musculoskeletal:  Positive for joint pain.  All other systems reviewed and are negative.   Anti-infectives: Anti-infectives (From admission, onward)    Start     Dose/Rate Route Frequency Ordered Stop   12/05/23 1415  linezolid (ZYVOX) tablet 600 mg        600 mg Oral Every 12 hours 12/05/23 1316 12/19/23 0959   12/05/23 0400  vancomycin  (VANCOREADY) IVPB 1500 mg/300 mL  Status:  Discontinued        1,500 mg 150 mL/hr over 120 Minutes Intravenous Every 12 hours 12/04/23 1409 12/05/23 1316   12/04/23 1430  vancomycin  (VANCOREADY) IVPB 2000 mg/400 mL        2,000 mg 200 mL/hr over 120 Minutes Intravenous  Once 12/04/23 1330 12/04/23 1612   12/02/23 0000  piperacillin-tazobactam (ZOSYN) IVPB 3.375 g  Status:  Discontinued        3.375 g 12.5 mL/hr over 240 Minutes Intravenous Every 8 hours 12/01/23 1910 12/04/23 1339   12/01/23 1800  vancomycin  (VANCOCIN ) IVPB 1000 mg/200 mL premix       Placed in Followed by Linked Group   1,000 mg 200 mL/hr over 60 Minutes Intravenous  Once 12/01/23 1645 12/01/23 1902   12/01/23 1730  piperacillin-tazobactam (ZOSYN) IVPB 3.375 g        3.375 g 100 mL/hr over 30 Minutes Intravenous  Once 12/01/23 1721 12/01/23 1854   12/01/23 1700  gentamicin  (GARAMYCIN ) 600 mg in dextrose  5 % 100 mL IVPB  Status:  Discontinued        7 mg/kg  85.1 kg (Adjusted) 115 mL/hr over 60 Minutes Intravenous  Once 12/01/23 1645 12/01/23 1721    12/01/23 1645  vancomycin  (VANCOCIN ) IVPB 1000 mg/200 mL premix       Placed in Followed by Linked Group   1,000 mg 200 mL/hr over 60 Minutes Intravenous  Once 12/01/23 1645 12/01/23 1759   12/01/23 1615  vancomycin  (VANCOREADY) IVPB 2000 mg/400 mL  Status:  Discontinued        2,000 mg 200 mL/hr over 120 Minutes Intravenous  Once 12/01/23 1614 12/01/23 1645   12/01/23 1615  gentamicin  (GARAMYCIN ) 200 mg in dextrose  5 % 50 mL IVPB  Status:  Discontinued        2 mg/kg  99.8 kg 110 mL/hr over 30 Minutes Intravenous  Once 12/01/23 1614 12/01/23 1645       Current Facility-Administered Medications  Medication Dose Route Frequency Provider Last Rate Last Admin   amLODipine (NORVASC) tablet 5 mg  5 mg Oral Daily Adhikari, Amrit, MD   5 mg at 12/06/23 0800   atorvastatin  (LIPITOR) tablet 80 mg  80 mg Oral QHS Manny, Theodore B Jr., MD   80 mg at 12/05/23 2110   Chlorhexidine  Gluconate Cloth 2 % PADS 6 each  6 each Topical Daily Manny, Theodore B Jr., MD   6 each at 12/06/23 1001   colchicine  tablet 0.6 mg  0.6  mg Oral Daily Adhikari, Amrit, MD   0.6 mg at 12/06/23 0800   diclofenac  Sodium (VOLTAREN ) 1 % topical gel 2 g  2 g Topical QID Adhikari, Amrit, MD   2 g at 12/06/23 1018   empagliflozin  (JARDIANCE ) tablet 25 mg  25 mg Oral Daily Jillian Buttery, MD   25 mg at 12/06/23 1018   finasteride  (PROSCAR ) tablet 5 mg  5 mg Oral Daily Manny, Theodore B Jr., MD   5 mg at 12/06/23 0800   hydrALAZINE (APRESOLINE) injection 10 mg  10 mg Intravenous Q6H PRN Manny, Theodore B Jr., MD   10 mg at 12/02/23 1125   HYDROmorphone  (DILAUDID ) injection 1 mg  1 mg Intravenous Q3H PRN Adhikari, Amrit, MD   1 mg at 12/06/23 0314   insulin  aspart (novoLOG ) injection 0-15 Units  0-15 Units Subcutaneous TID WC Manny, Theodore B Jr., MD   5 Units at 12/06/23 9242   insulin  glargine-yfgn (SEMGLEE ) injection 40 Units  40 Units Subcutaneous Daily Manny, Theodore B Jr., MD   40 Units at 12/06/23 1018   linezolid (ZYVOX)  tablet 600 mg  600 mg Oral Q12H Luiz Channel, MD   600 mg at 12/06/23 1018   lisinopril  (ZESTRIL ) tablet 20 mg  20 mg Oral Daily Adhikari, Amrit, MD   20 mg at 12/06/23 0759   metFORMIN  (GLUCOPHAGE ) tablet 1,000 mg  1,000 mg Oral BID WC Adhikari, Amrit, MD   1,000 mg at 12/06/23 0758   naloxone (NARCAN) injection 0.4 mg  0.4 mg Intravenous PRN Manny, Theodore B Jr., MD       oxyCODONE  (Oxy IR/ROXICODONE ) immediate release tablet 5 mg  5 mg Oral Q6H PRN Adhikari, Amrit, MD   5 mg at 12/06/23 0758   polyethylene glycol (MIRALAX / GLYCOLAX) packet 17 g  17 g Oral Daily Jillian Buttery, MD   17 g at 12/03/23 1315   predniSONE  (DELTASONE ) tablet 40 mg  40 mg Oral Q breakfast Drusilla Sabas RAMAN, MD       senna-docusate (Senokot-S) tablet 1 tablet  1 tablet Oral BID Jillian Buttery, MD   1 tablet at 12/06/23 0802   tamsulosin  (FLOMAX ) capsule 0.4 mg  0.4 mg Oral Daily Alvaro Ricardo KATHEE Mickey., MD   0.4 mg at 12/06/23 0800     Objective: Vital signs in last 24 hours: Temp:  [97.7 F (36.5 C)-98.7 F (37.1 C)] 97.7 F (36.5 C) (07/02 0601) Pulse Rate:  [76-86] 76 (07/02 0601) Resp:  [18] 18 (07/02 0601) BP: (128-153)/(72-88) 153/88 (07/02 0601) SpO2:  [92 %-98 %] 96 % (07/02 0601)  Intake/Output from previous day: 07/01 0701 - 07/02 0700 In: -  Out: 3300 [Urine:3300] Intake/Output this shift: Total I/O In: -  Out: 500 [Urine:500]   Physical Exam Vitals reviewed.  Constitutional:      Appearance: Normal appearance.  Genitourinary:    Comments: Urine clear in tubing.  He has persistent swelling and induration of the left testicle and epididymis but no tenderness.  Neurological:     Mental Status: He is alert.     Lab Results:  Recent Labs    12/05/23 0828 12/06/23 0520  WBC 17.2* 18.9*  HGB 10.6* 11.3*  HCT 33.8* 35.0*  PLT 423* 427*   BMET Recent Labs    12/05/23 0828 12/06/23 0520  NA 131* 133*  K 3.3* 4.1  CL 98 101  CO2 24 22  GLUCOSE 98 232*  BUN 10 20   CREATININE 0.64 0.60*  CALCIUM  8.6*  8.9   PT/INR No results for input(s): LABPROT, INR in the last 72 hours. ABG No results for input(s): PHART, HCO3 in the last 72 hours.  Invalid input(s): PCO2, PO2 Culture from 11/15/23 at [facility] reviewed.   Studies/Results: DG Knee 1-2 Views Right Result Date: 12/05/2023 CLINICAL DATA:  58 year old male with soft tissue swelling. EXAM: RIGHT KNEE - 1-2 VIEW COMPARISON:  None Available. FINDINGS: AP and 2 cross-table lateral views. Suprapatellar joint effusion appears small. Intact patella. No acute osseous abnormality identified. Maintained joint spaces but medial and lateral compartment Chondrocalcinosis which can be seen in the setting of calcium  pyrophosphate deposition disease. There is some calcified peripheral vascular disease visible. IMPRESSION: 1. Small joint effusion. No acute osseous abnormality identified. 2. Chondrocalcinosis which can be seen in the setting of calcium  pyrophosphate deposition disease. Electronically Signed   By: VEAR Hurst M.D.   On: 12/05/2023 10:40   CT PELVIS WO CONTRAST Result Date: 12/04/2023 CLINICAL DATA:  Follow-up prostatitis and complex prostatic abscess EXAM: CT PELVIS WITHOUT CONTRAST TECHNIQUE: Multidetector CT imaging of the pelvis was performed following the standard protocol without intravenous contrast. RADIATION DOSE REDUCTION: This exam was performed according to the departmental dose-optimization program which includes automated exposure control, adjustment of the mA and/or kV according to patient size and/or use of iterative reconstruction technique. COMPARISON:  12/01/2023 FINDINGS: Urinary Tract: Bladder is decompressed by Foley catheter. A small amount of air is noted within the bladder related to the catheter placement. Visualized kidneys are unremarkable. Bowel: Visualized bowel shows no obstructive or inflammatory changes. Vascular/Lymphatic: Atherosclerotic calcifications of the aorta are noted.  Stable left common iliac lymph node is again seen. Reproductive: Prostate is well visualized. The previously seen large complex abscess has nearly completely resolved. Some mild fullness in the right side of the prostate remains although no discrete fluid collection is seen. Other:  Small fat containing inguinal hernias. Musculoskeletal: Stable right sacral bone island is noted. IMPRESSION: Changes consistent with interval unroofing of the prostatic abscess with near complete resolution of the abnormality. Some fullness in the right half of the prostate is again seen although no discrete fluid collection is noted at this time. Electronically Signed   By: Oneil Devonshire M.D.   On: 12/04/2023 20:04   CT's Reviewed.   Assessment and Plan: Prostatic abscess with epididymo-orchitis.   He is doing well s/p TUR of the abscess and is afebrile.  CT looked good.  First time meeting him but patient reports that testicular swelling has improved and pain has resolved.  Swelling and induration appears to be limited to the left hemiscrotum.    Sensitivities have resulted.  Appears patient could transition to oral treatment.  ABX per ID.  Gout flare resolving.  Patient has been able to ambulate.  Colchicine  and prednisone  per primary team.  Patient has been encouraged to refrain from chewing tobacco.  The vasoconstrictive effects of the nicotine  in addition to the steroids are not going to benefit postoperative healing. He expressed understanding and will consider a patch.  Cleared to discharge from urologic perspective.  Voiding trial outpatient has already been scheduled.  Please call with questions      LOS: 5 days    OLE ORN Doree Kuehne 12/06/2023  Patient ID: Martin [name], male   DOB: 06-06-1966, 58 y.o.   MRN: [id number] Patient ID: Martin [name], male   DOB: [birth year], 58 y.o.   MRN: [id number]

## 2023-12-06 NOTE — Progress Notes (Incomplete)
 [facility]  PROGRESS NOTE  Martin [name] [name]:[id number] DOB: 05-07-1966 DOA: 12/01/2023 PCP: [name], PA-C   Brief HPI:    58 year old male with history of insulin -dependent diabetes type 2, hypertension who presented with rectal pain for 2 weeks.  On presentation he was hypertensive, afebrile.  Ultrasound of the scrotum showed acute right epididymitis, acute left epididymoorchitis, small complex right hydrocele.  CT pelvis showed multiple loculated fluid collection with thin and moderately concerning was occupying the majority of the prostate gland, consistent with prostate abscess, bladder outlet obstruction due to enlarged prostate gland.  Urology consulted.  S/P  TURP, unroofing of the prostate abscess.  On broad-spectrum antibiotics.  Wound culture showing staph, sensitivity pending.  ID also following.  Developed severe right knee pain today likely secondary to gout, PT consulted.      Assessment/Plan:   Prostatic abscess/bilateral epididymoorchitis: Urology following.  Presented with leukocytosis but no fever.  . Status post TURP, unroofing of the prostate abscess.  Wound culture showing staph.  Currently on vancomycin .  ID was following.  Now the plan is to continue 2 weeks course of of linezolid and follow-up with ID as an outpatient.  Currently has a Foley catheter in, bladder irrigation. Patient will be discharged with Foley catheter. Follow-up CT pelvis showed decompression of the abscess.  Has moderate leukocytosis   Urinary retention: Secondary to prostatic enlargement.  Foley placed.  Continue Foley until you follow-up with urology as an outpatient.   Diabetes type 2: A1c of 10.9  On high-dose insulin  at home.  Continue current insulin  regimen.  Monitor blood sugars.  Diabetic coordinator consulted.  He follows with endocrinology, Dr. [name]   Hypertension: On lisinopril  at home.  Restarted.Added amlodipine   Right knee discomfort/history of gout: Found to have  significant swelling, warmth of right knee.  Has history of gout.  Started on colchicine .Given a dose of solumedrol because of severe pain.  X-rays of the right knee does not show any fracture or dislocation.  Uric acid normal but it may not correlate with acute gout.  Elevated CRP/ESR.  PT also consulted    Medications     amLODipine  5 mg Oral Daily   atorvastatin   80 mg Oral QHS   Chlorhexidine  Gluconate Cloth  6 each Topical Daily   colchicine   0.6 mg Oral Daily   diclofenac  Sodium  2 g Topical QID   empagliflozin   25 mg Oral Daily   finasteride   5 mg Oral Daily   insulin  aspart  0-15 Units Subcutaneous TID WC   insulin  glargine-yfgn  40 Units Subcutaneous Daily   linezolid  600 mg Oral Q12H   lisinopril   20 mg Oral Daily   metFORMIN   1,000 mg Oral BID WC   polyethylene glycol  17 g Oral Daily   senna-docusate  1 tablet Oral BID   tamsulosin   0.4 mg Oral Daily     Data Reviewed:   CBG:  Recent Labs  Lab 12/05/23 0826 12/05/23 1142 12/05/23 1618 12/05/23 2036 12/06/23 0727  GLUCAP 107* 139* 168* 276* 212*    SpO2: 96 % O2 Flow Rate (L/min): 3 L/min    Vitals:   12/05/23 0347 12/05/23 1239 12/05/23 2035 12/06/23 0601  BP: (!) 146/69 (!) 146/82 128/72 (!) 153/88  Pulse: 84 82 86 76  Resp: 19 18 18 18   Temp: 98.1 F (36.7 C) 97.8 F (36.6 C) 98.7 F (37.1 C) 97.7 F (36.5 C)  TempSrc: Oral Oral Oral Oral  SpO2: 94% 98% 92% 96%  Weight:      Height:          Data Reviewed:  Basic Metabolic Panel: Recent Labs  Lab 12/01/23 1304 12/02/23 0531 12/03/23 0544 12/05/23 0828 12/06/23 0520  NA 134* 132* 133* 131* 133*  K 4.0 3.6 3.5 3.3* 4.1  CL 99 100 104 98 101  CO2 22 23 21* 24 22  GLUCOSE 163* 174* 213* 98 232*  BUN 13 11 14 10 20   CREATININE 0.74 0.70 0.64 0.64 0.60*  CALCIUM  9.6 8.6* 8.2* 8.6* 8.9    CBC: Recent Labs  Lab 12/01/23 1304 12/02/23 0531 12/03/23 0544 12/04/23 0530 12/05/23 0828 12/06/23 0520  WBC 17.4* 18.6* 17.8*  14.2* 17.2* 18.9*  NEUTROABS 13.8*  --   --   --   --   --   HGB 12.0* 10.9* 10.3* 10.1* 10.6* 11.3*  HCT 36.3* 35.7* 33.3* 32.8* 33.8* 35.0*  MCV 82.7 89.0 88.1 87.0 84.7 83.5  PLT 473* 398 362 410* 423* 427*    LFT Recent Labs  Lab 12/01/23 1304 12/02/23 0531  AST 16 13*  ALT 11 11  ALKPHOS 241* 128*  BILITOT 0.3 0.7  PROT 9.2* 7.8  ALBUMIN 3.3* 2.3*     Antibiotics: Anti-infectives (From admission, onward)    Start     Dose/Rate Route Frequency Ordered Stop   12/05/23 1415  linezolid (ZYVOX) tablet 600 mg        600 mg Oral Every 12 hours 12/05/23 1316 12/19/23 0959   12/05/23 0400  vancomycin  (VANCOREADY) IVPB 1500 mg/300 mL  Status:  Discontinued        1,500 mg 150 mL/hr over 120 Minutes Intravenous Every 12 hours 12/04/23 1409 12/05/23 1316   12/04/23 1430  vancomycin  (VANCOREADY) IVPB 2000 mg/400 mL        2,000 mg 200 mL/hr over 120 Minutes Intravenous  Once 12/04/23 1330 12/04/23 1612   12/02/23 0000  piperacillin-tazobactam (ZOSYN) IVPB 3.375 g  Status:  Discontinued        3.375 g 12.5 mL/hr over 240 Minutes Intravenous Every 8 hours 12/01/23 1910 12/04/23 1339   12/01/23 1800  vancomycin  (VANCOCIN ) IVPB 1000 mg/200 mL premix       Placed in Followed by Linked Group   1,000 mg 200 mL/hr over 60 Minutes Intravenous  Once 12/01/23 1645 12/01/23 1902   12/01/23 1730  piperacillin-tazobactam (ZOSYN) IVPB 3.375 g        3.375 g 100 mL/hr over 30 Minutes Intravenous  Once 12/01/23 1721 12/01/23 1854   12/01/23 1700  gentamicin  (GARAMYCIN ) 600 mg in dextrose  5 % 100 mL IVPB  Status:  Discontinued        7 mg/kg  85.1 kg (Adjusted) 115 mL/hr over 60 Minutes Intravenous  Once 12/01/23 1645 12/01/23 1721   12/01/23 1645  vancomycin  (VANCOCIN ) IVPB 1000 mg/200 mL premix       Placed in Followed by Linked Group   1,000 mg 200 mL/hr over 60 Minutes Intravenous  Once 12/01/23 1645 12/01/23 1759   12/01/23 1615  vancomycin  (VANCOREADY) IVPB 2000 mg/400 mL  Status:   Discontinued        2,000 mg 200 mL/hr over 120 Minutes Intravenous  Once 12/01/23 1614 12/01/23 1645   12/01/23 1615  gentamicin  (GARAMYCIN ) 200 mg in dextrose  5 % 50 mL IVPB  Status:  Discontinued        2 mg/kg  99.8 kg 110 mL/hr over 30 Minutes Intravenous  Once  12/01/23 1614 12/01/23 1645        DVT prophylaxis: ***  Code Status: ***  Family Communication: ***   CONSULTS ***   Subjective   ***   Objective    Physical Examination:   General:  *** Cardiovascular: *** Respiratory: *** Abdomen: *** Extremities: ***  Neurologic:  ***   Status is: Inpatient:  ***           Jeren Dufrane S Lavera Vandermeer   [facility] If 7PM-7AM, please contact night-coverage at [email or web address], Office  [phone]   12/06/2023, 8:21 AM  LOS: 5 days

## 2023-12-06 NOTE — Plan of Care (Signed)
   Problem: Health Behavior/Discharge Planning: Goal: Ability to manage health-related needs will improve Outcome: Progressing   Problem: Activity: Goal: Risk for activity intolerance will decrease Outcome: Progressing   Problem: Pain Managment: Goal: General experience of comfort will improve and/or be controlled Outcome: Progressing

## 2023-12-06 NOTE — TOC Transition Note (Signed)
 Transition of Care [facility]) - Discharge Note   Patient Details  Name: Martin [name] MRN: [id number] Date of Birth: [birth year]  [facility] of [facility]) CM/SW Contact:  Bascom Service, RN Phone Number: 12/06/2023, 11:25 AM   Clinical Narrative: Patient d/c plan home w/HHC/dme-no preference-[employer] rep Cindie accepted for HHPT;[employer] rep Jermaine will deliver 3n1 to rm prior d/c. Has own transport home. No further CM needs.      Final next level of care: Home w Home Health Services Barriers to Discharge: No Barriers Identified   Patient Goals and CMS Choice   CMS Medicare.gov Compare Post Acute Care list provided to:: Patient   [facility] ownership interest in [facility].provided to:: Patient    Discharge Placement                       Discharge Plan and Services Additional resources added to the After Visit Summary for     Discharge Planning Services: CM Consult Post Acute Care Choice: Home Health, Durable Medical Equipment          DME Arranged: 3-N-1 DME Agency: [employer] Date DME Agency Contacted: 12/06/23 Time DME Agency Contacted: 1125 Representative spoke with at DME Agency: [place] HH Arranged: PT HH Agency: [facility] Agency Contacted: 12/06/23 Time HH Agency Contacted: 1125 Representative spoke with at [facility] Agency: Cindie  Social Drivers of Health (SDOH) Interventions SDOH Screenings   Food Insecurity: No Food Insecurity (12/01/2023)  Housing: Low Risk  (12/01/2023)  Transportation Needs: No Transportation Needs (12/01/2023)  Utilities: Not At Risk (12/01/2023)  Depression (PHQ2-9): Low Risk  (05/03/2021)  Social Connections: Unknown (10/11/2022)   Received from [facility]  Tobacco Use: High Risk (12/01/2023)     Readmission Risk Interventions    12/04/2023    9:06 AM  Readmission Risk Prevention Plan  Post Dischage Appt Complete  Medication Screening Complete  Transportation Screening Complete

## 2023-12-06 NOTE — Inpatient Diabetes Management (Signed)
 Inpatient Diabetes Program Recommendations  AACE/ADA: New Consensus Statement on Inpatient Glycemic Control (2015)  Target Ranges:  Prepandial:   less than 140 mg/dL      Peak postprandial:   less than 180 mg/dL (1-2 hours)      Critically ill patients:  140 - 180 mg/dL   Lab Results  Component Value Date   GLUCAP 212 (H) 12/06/2023   HGBA1C 10.9 (H) 12/01/2023    Review of Glycemic Control  Diabetes history: DM2 Outpatient Diabetes medications: Tresiba  110 daily, Jardiance  25 daily, metformin  1000 BID Current orders for Inpatient glycemic control: Semglee  40 daily, Novolog  0-15 TID, Jardiance  25 daily  HgbA1C - 10.9% 232, 212 this am  Inpatient Diabetes Program Recommendations:    Add Novolog  0-15 TID for home meds  Needs to secure PCP/Endo for diabetes management. Pt stated he was not going back to Endo or present PCP.   Follow.  Thank you. Shona Brandy, RD, LDN, CDCES Inpatient Diabetes Coordinator [phone]

## 2023-12-06 NOTE — Progress Notes (Signed)
 Discharge medications delivered to patient at bedside D [facility]

## 2023-12-06 NOTE — Discharge Summary (Signed)
 Physician Discharge Summary   Patient: Martin [name] MRN: [id number] DOB: 11-21-1965  Admit date:     12/01/2023  Discharge date: 12/06/23  Discharge Physician: Sabas GORMAN Brod   PCP: [name], PA-C   Recommendations at discharge:   Follow-up with urology as outpatient Follow with PCP in 1 week  Discharge Diagnoses: Principal Problem:   Prostate abscess Active Problems:   Insulin  dependent type 2 diabetes mellitus (HCC)   Testicular abscess   Epididymoorchitis  Resolved Problems:   * No resolved hospital problems. *  Hospital Course: 58 year old male with history of insulin -dependent diabetes type 2, hypertension who presented with rectal pain for 2 weeks.  On presentation he was hypertensive, afebrile.  Ultrasound of the scrotum showed acute right epididymitis, acute left epididymoorchitis, small complex right hydrocele.  CT pelvis showed multiple loculated fluid collection with thin and moderately concerning was occupying the majority of the prostate gland, consistent with prostate abscess, bladder outlet obstruction due to enlarged prostate gland.  Urology consulted.  S/P  TURP, unroofing of the prostate abscess.  On broad-spectrum antibiotics.  Wound culture showing staph, sensitivity pending.  ID also following.  Developed severe right knee pain today likely secondary to gout, PT consulted.      Assessment and Plan:  Prostatic abscess/bilateral epididymoorchitis: Urology following.  Presented with leukocytosis but no fever.  . Status post TURP, unroofing of the prostate abscess.  Wound culture showing staph.  Currently on vancomycin .  ID was following.  Now the plan is to continue 2 weeks course of of linezolid and follow-up with ID as an outpatient.  Currently has a Foley catheter in, bladder irrigation. Patient will be discharged with Foley catheter. Follow-up CT pelvis showed decompression of the abscess.  Has moderate leukocytosis - Patient will be discharged on Zyvox  600 mg p.o. twice daily for 13 more days.   Urinary retention: Secondary to prostatic enlargement.  Foley placed.  Continue Foley until you follow-up with urology as an outpatient.   Diabetes type 2: A1c of 10.9  On high-dose insulin  at home.  Continue current insulin  regimen.  Monitor blood sugars.  Diabetic coordinator consulted.  He follows with endocrinology, Dr. [name]   Hypertension: On lisinopril  at home.  Restarted.Added amlodipine   Right knee discomfort/history of gout: Found to have significant swelling, warmth of right knee.  Has history of gout.  Started on colchicine .Given a dose of solumedrol because of severe pain.  X-rays of the right knee does not show any fracture or dislocation.  Uric acid normal but it may not correlate with acute gout.  Elevated CRP/ESR.  PT also consulted.  Significantly improved.  Will give additional dose of prednisone  40 mg p.o. x 1 today.  Will discharge on colchicine  0.6 mg p.o. daily.       Consultants:  Procedures performed:  Disposition: Home Diet recommendation:  Discharge Diet Orders (From admission, onward)     Start     Ordered   12/06/23 0000  Diet - low sodium heart healthy        12/06/23 1201           Regular diet DISCHARGE MEDICATION: Allergies as of 12/06/2023   No Known Allergies      Medication List     STOP taking these medications    meloxicam  15 MG tablet Commonly known as: MOBIC    sulfamethoxazole -trimethoprim  800-160 MG tablet Commonly known as: BACTRIM  DS   traMADol  50 MG tablet Commonly known as: ULTRAM   TAKE these medications    amLODipine 5 MG tablet Commonly known as: NORVASC Take 1 tablet (5 mg total) by mouth daily. Start taking on: December 07, 2023   atorvastatin  80 MG tablet Commonly known as: LIPITOR Take 1 tablet (80 mg total) by mouth at bedtime.   colchicine  0.6 MG tablet Take 1 tablet (0.6 mg total) by mouth daily. Start taking on: December 07, 2023   Dexcom G7 Sensor  Misc Change 1 sensor every 10 days on insulin  3 times daily   FreeStyle Libre 3 Sensor Misc Change sensor every 14 days.   diclofenac  Sodium 1 % Gel Commonly known as: VOLTAREN  Apply 2 g topically 4 (four) times daily as needed. Apply to knees   finasteride  5 MG tablet Commonly known as: PROSCAR  Take 1 (one) tablet by mouth daily.   freestyle lancets Use three times daily as directed   FreeStyle Libre 3 Reader [place] Use 4 (four) times daily.   FREESTYLE LITE test strip Generic drug: glucose blood Use three times daily as directed   gabapentin  300 MG capsule Commonly known as: Neurontin  Take 1 capsule (300 mg total) by mouth 3 (three) times daily.   Jardiance  25 MG Tabs tablet Generic drug: empagliflozin  Take 1 tablet (25 mg total) by mouth daily.   linezolid 600 MG tablet Commonly known as: ZYVOX Take 1 tablet (600 mg total) by mouth every 12 (twelve) hours for 13 days.   lisinopril  20 MG tablet Commonly known as: ZESTRIL  Take 1 tablet (20 mg total) by mouth daily. Start taking on: December 07, 2023 What changed:  medication strength how much to take   metFORMIN  500 MG 24 hr tablet Commonly known as: GLUCOPHAGE -XR Take 2 tablets (1,000 mg total) by mouth 2 (two) times daily with a meal.   oxyCODONE  5 MG immediate release tablet Commonly known as: Oxy IR/ROXICODONE  Take 1 tablet (5 mg total) by mouth every 6 (six) hours as needed for moderate pain (pain score 4-6).   senna-docusate 8.6-50 MG tablet Commonly known as: Senokot-S Take 1 tablet by mouth at bedtime as needed for mild constipation.   tadalafil  20 MG tablet Commonly known as: CIALIS  Take 1 tablet (20 mg total) by mouth one hour before sex as needed for E.D.   tamsulosin  0.4 MG Caps capsule Commonly known as: FLOMAX  Take 1 capsule (0.4 mg total) by mouth daily. Start taking on: December 07, 2023 What changed:  how much to take when to take this Another medication with the same name was removed. Continue  taking this medication, and follow the directions you see here.   TechLite Pen Needles 32G X 6 MM Misc Generic drug: Insulin  Pen Needle Use to inject insulin  2 times a day for 90 days.   TechLite Pen Needles 32G X 6 MM Misc Generic drug: Insulin  Pen Needle Use to inject insulin  2 (two) times daily.   Tresiba  FlexTouch 200 UNIT/ML FlexTouch Pen Generic drug: insulin  degludec Inject 110 Units into the skin daily.               Durable Medical Equipment  (From admission, onward)           Start     Ordered   12/06/23 1048  For home use only DME Walker rolling  Once       Question Answer Comment  Walker: With 5 Inch Wheels   Patient needs a walker to treat with the following condition Unsteady gait      12/06/23 1047  12/06/23 1048  For home use only DME 3 n 1  Once        12/06/23 1047            Follow-up Information     [facility] Follow up on 12/05/2023.   Why: The office will call to arrange f/u and catheter removal. Contact information: [address] [place] [address] [place]  [address] [phone]        [name], PA-C Follow up in 1 week(s).   Specialties: Family Medicine, Physician Assistant Contact information: [address] [place] [address] [phone]         Luiz Channel, MD. Schedule an appointment as soon as possible for a visit.   Specialty: Infectious Diseases Contact [address] [place] [address] [phone]                Discharge Exam: Fredricka Weights   12/01/23 1142 12/01/23 2006  Weight: 99.8 kg 98.6 kg   General-appears in no acute distress Heart-S1-S2, regular, no murmur auscultated Lungs-clear to auscultation bilaterally, no wheezing or crackles auscultated Abdomen-soft, nontender, no organomegaly Extremities-no edema in the lower extremities Neuro-alert, oriented x3, no focal deficit noted  Condition at discharge: good  The  results of significant diagnostics from this hospitalization (including imaging, microbiology, ancillary and laboratory) are listed below for reference.   Imaging Studies: DG Knee 1-2 Views Right Result Date: 12/05/2023 CLINICAL DATA:  58 year old male with soft tissue swelling. EXAM: RIGHT KNEE - 1-2 VIEW COMPARISON:  None Available. FINDINGS: AP and 2 cross-table lateral views. Suprapatellar joint effusion appears small. Intact patella. No acute osseous abnormality identified. Maintained joint spaces but medial and lateral compartment Chondrocalcinosis which can be seen in the setting of calcium  pyrophosphate deposition disease. There is some calcified peripheral vascular disease visible. IMPRESSION: 1. Small joint effusion. No acute osseous abnormality identified. 2. Chondrocalcinosis which can be seen in the setting of calcium  pyrophosphate deposition disease. Electronically Signed   By: VEAR Hurst M.D.   On: 12/05/2023 10:40   CT PELVIS WO CONTRAST Result Date: 12/04/2023 CLINICAL DATA:  Follow-up prostatitis and complex prostatic abscess EXAM: CT PELVIS WITHOUT CONTRAST TECHNIQUE: Multidetector CT imaging of the pelvis was performed following the standard protocol without intravenous contrast. RADIATION DOSE REDUCTION: This exam was performed according to the departmental dose-optimization program which includes automated exposure control, adjustment of the mA and/or kV according to patient size and/or use of iterative reconstruction technique. COMPARISON:  12/01/2023 FINDINGS: Urinary Tract: Bladder is decompressed by Foley catheter. A small amount of air is noted within the bladder related to the catheter placement. Visualized kidneys are unremarkable. Bowel: Visualized bowel shows no obstructive or inflammatory changes. Vascular/Lymphatic: Atherosclerotic calcifications of the aorta are noted. Stable left common iliac lymph node is again seen. Reproductive: Prostate is well visualized. The previously  seen large complex abscess has nearly completely resolved. Some mild fullness in the right side of the prostate remains although no discrete fluid collection is seen. Other:  Small fat containing inguinal hernias. Musculoskeletal: Stable right sacral bone island is noted. IMPRESSION: Changes consistent with interval unroofing of the prostatic abscess with near complete resolution of the abnormality. Some fullness in the right half of the prostate is again seen although no discrete fluid collection is noted at this time. Electronically Signed   By: Oneil Devonshire M.D.   On: 12/04/2023 20:04   CT PELVIS W CONTRAST Result Date: 12/01/2023 CLINICAL DATA:  Hemorrhoids  and rectal pain for the past 2 weeks. Clinical concern for possible prostate abscess. Left testicle swelling and pain. Started on antibiotics 1 week ago. EXAM: CT PELVIS WITH CONTRAST TECHNIQUE: Multidetector CT imaging of the pelvis was performed using the standard protocol following the bolus administration of intravenous contrast. RADIATION DOSE REDUCTION: This exam was performed according to the departmental dose-optimization program which includes automated exposure control, adjustment of the mA and/or kV according to patient size and/or use of iterative reconstruction technique. CONTRAST:  OMNIPAQUE  IOHEXOL  300 MG/ML  SOLN COMPARISON:  08/07/2023. FINDINGS: Urinary Tract: Interval dilated bladder extending into the mid abdomen with mild diffuse wall thickening. Moderate dilatation of both distal ureters with mild progression. Bowel:  Unremarkable visualized pelvic bowel loops. Vascular/Lymphatic: The previously demonstrated 10 mm mildly enlarged left common iliac lymph node is unchanged on image number 15/2. No other enlarged lymph nodes. No significant vascular abnormalities. Reproductive: Interval significant increase in size of the prostate gland, containing multiple loculated fluid collections with thin and moderately thickened surrounding  walls occupying the majority of the prostate gland. The largest of these is located on the right, measuring 7.7 x 3.9 cm on image number 64/2. Other: Stable postsurgical scarring in the anterior abdominal wall and subcutaneous tissues at the level of the umbilicus. Small bilateral inguinal hernias containing fat. Musculoskeletal: Stable probable bone island in the anterior right sacral ala and lumbosacral spine degenerative changes. IMPRESSION: 1. Interval significant increase in size of the prostate gland, containing multiple loculated fluid collections with thin and moderately thickened surrounding walls occupying the majority of the prostate gland. The largest of these is located on the right, measuring 7.7 x 3.9 cm. These are consistent with prostate abscesses. 2. Interval dilated bladder extending into the mid abdomen with mild diffuse wall thickening. This is consistent with bladder outlet obstruction by the markedly enlarged prostate gland. 3. Moderate dilatation of both distal ureters with mild progression. 4. Stable 10 mm mildly enlarged left common iliac lymph node, most likely reactive. 5. Small bilateral inguinal hernias containing fat. Electronically Signed   By: Elspeth Bathe M.D.   On: 12/01/2023 15:01   US  SCROTUM W/DOPPLER Result Date: 12/01/2023 CLINICAL DATA:  Left scrotal edema and pain EXAM: SCROTAL ULTRASOUND DOPPLER ULTRASOUND OF THE TESTICLES TECHNIQUE: Complete ultrasound examination of the testicles, epididymis, and other scrotal structures was performed. Color and spectral Doppler ultrasound were also utilized to evaluate blood flow to the testicles. COMPARISON:  None Available. FINDINGS: Right testicle Measurements: 5.4 x 2.2 x 3.5 cm. Normal parenchymal echogenicity and echotexture. No intratesticular mass or calcification. Normal color flow vascularity. Left testicle Measurements: 4.9 x 2.9 x 2.8 cm. Normal parenchymal echogenicity and echotexture. No intratesticular mass or  calcification identified. Diffusely increased parenchymal vascularity best appreciated on color flow imaging (image # 79). Right epididymis: Thickened and heterogeneous within the tail with diffusely increased vascularity in keeping with changes of acute epididymitis. Left epididymis: Diffusely thickened and heterogeneous with diffusely increased vascularity in keeping with changes of acute epididymitis. Hydrocele: A small complex right hydrocele is present containing extensive septa and internal debris possibly representing a hematocele or pyocele. Varicocele:  None visualized. Pulsed Doppler interrogation of both testes demonstrates normal low resistance arterial and venous waveforms bilaterally. IMPRESSION: 1. Acute right epididymitis. 2. Acute left epididymo-orchitis. 3. Small complex right hydrocele containing extensive septa and internal debris possibly representing a hematocele or pyocele. Electronically Signed   By: Dorethia Molt M.D.   On: 12/01/2023 14:23    Microbiology:  Results for orders placed or performed during the hospital encounter of 12/01/23  Urine Culture     Status: Abnormal   Collection Time: 12/01/23  1:04 PM   Specimen: Urine, Clean Catch  Result Value Ref Range Status   Specimen Description   Final    URINE, CLEAN CATCH Performed at [facility], [address] [place], [place] [address]    Special Requests   Final    NONE Performed at [facility], [address] [place], [place] [address]    Culture MULTIPLE SPECIES PRESENT, SUGGEST RECOLLECTION (A)  Final   Report Status 12/03/2023 FINAL  Final  Culture, blood (Routine X 2) w Reflex to ID Panel     Status: None   Collection Time: 12/01/23 10:30 PM   Specimen: BLOOD LEFT ARM  Result Value Ref Range Status   Specimen Description   Final    BLOOD LEFT ARM Performed at [facility], [address], [place], [place] [address]    Special Requests   Final    BOTTLES DRAWN AEROBIC ONLY  Blood Culture results may not be optimal due to an inadequate volume of blood received in culture bottles Performed at [facility], [address], [place], [place] [address]    Culture   Final    NO GROWTH 5 DAYS Performed at [facility], [address], [place], [place] [address]    Report Status 12/06/2023 FINAL  Final  Culture, blood (Routine X 2) w Reflex to ID Panel     Status: None   Collection Time: 12/01/23 10:30 PM   Specimen: BLOOD  Result Value Ref Range Status   Specimen Description   Final    BLOOD SITE NOT SPECIFIED Performed at [facility], [address], [place], [place] [address]    Special Requests   Final    BOTTLES DRAWN AEROBIC AND ANAEROBIC Blood Culture adequate volume Performed at [facility], [address], [place], [place] [address]    Culture   Final    NO GROWTH 5 DAYS Performed at [facility], [address] [place], [place] [address]    Report Status 12/06/2023 FINAL  Final  Aerobic/Anaerobic Culture w Gram Stain (surgical/deep wound)     Status: None (Preliminary result)   Collection Time: 12/02/23 12:59 PM   Specimen: Prostate; GU  Result Value Ref Range Status   Specimen Description   Final    PROSTATE Performed at [facility], [address], [place], [place] [address]    Special Requests   Final    NONE Performed at [facility], [address] [place], [place] [address]    Gram Stain   Final    ABUNDANT WBC PRESENT,BOTH PMN AND MONONUCLEAR MODERATE GRAM POSITIVE COCCI Performed at [facility], [address], [place], [place] [address]    Culture   Final    ABUNDANT METHICILLIN RESISTANT STAPHYLOCOCCUS AUREUS NO ANAEROBES ISOLATED; CULTURE IN PROGRESS FOR 5 DAYS    Report Status PENDING  Incomplete   Organism ID, Bacteria METHICILLIN RESISTANT STAPHYLOCOCCUS AUREUS  Final      Susceptibility   Methicillin  resistant staphylococcus aureus - MIC*    CIPROFLOXACIN  >=8 RESISTANT Resistant     ERYTHROMYCIN >=8 RESISTANT Resistant     GENTAMICIN  <=0.5 SENSITIVE Sensitive     OXACILLIN >=4 RESISTANT Resistant     TETRACYCLINE >=  16 RESISTANT Resistant     VANCOMYCIN  <=0.5 SENSITIVE Sensitive     TRIMETH /SULFA  <=10 SENSITIVE Sensitive     CLINDAMYCIN  >=8 RESISTANT Resistant     RIFAMPIN <=0.5 SENSITIVE Sensitive     Inducible Clindamycin  NEGATIVE Sensitive     LINEZOLID 2 SENSITIVE Sensitive     * ABUNDANT METHICILLIN RESISTANT STAPHYLOCOCCUS AUREUS  MRSA Next Gen by PCR, Nasal     Status: None   Collection Time: 12/04/23  9:49 AM   Specimen: Nasal Mucosa; Nasal Swab  Result Value Ref Range Status   MRSA by PCR Next Gen NOT DETECTED NOT DETECTED Final    Comment: (NOTE) The GeneXpert MRSA Assay (FDA approved for NASAL specimens only), is one component of a comprehensive MRSA colonization surveillance program. It is not intended to diagnose MRSA infection nor to guide or monitor treatment for MRSA infections. Test performance is not FDA approved in patients less than 67 years old. Performed at [facility], [address] [place], [place] [address]     Labs: CBC: Recent Labs  Lab 12/01/23 1304 12/02/23 0531 12/03/23 0544 12/04/23 0530 12/05/23 0828 12/06/23 0520  WBC 17.4* 18.6* 17.8* 14.2* 17.2* 18.9*  NEUTROABS 13.8*  --   --   --   --   --   HGB 12.0* 10.9* 10.3* 10.1* 10.6* 11.3*  HCT 36.3* 35.7* 33.3* 32.8* 33.8* 35.0*  MCV 82.7 89.0 88.1 87.0 84.7 83.5  PLT 473* 398 362 410* 423* 427*   Basic Metabolic Panel: Recent Labs  Lab 12/01/23 1304 12/02/23 0531 12/03/23 0544 12/05/23 0828 12/06/23 0520  NA 134* 132* 133* 131* 133*  K 4.0 3.6 3.5 3.3* 4.1  CL 99 100 104 98 101  CO2 22 23 21* 24 22  GLUCOSE 163* 174* 213* 98 232*  BUN 13 11 14 10 20   CREATININE 0.74 0.70 0.64 0.64 0.60*  CALCIUM  9.6 8.6* 8.2* 8.6* 8.9   Liver Function Tests: Recent  Labs  Lab 12/01/23 1304 12/02/23 0531  AST 16 13*  ALT 11 11  ALKPHOS 241* 128*  BILITOT 0.3 0.7  PROT 9.2* 7.8  ALBUMIN 3.3* 2.3*   CBG: Recent Labs  Lab 12/05/23 0826 12/05/23 1142 12/05/23 1618 12/05/23 2036 12/06/23 0727  GLUCAP 107* 139* 168* 276* 212*    Discharge time spent: greater than 30 minutes.  Signed: Sabas GORMAN Brod, MD [facility] 12/06/2023

## 2023-12-07 LAB — AEROBIC/ANAEROBIC CULTURE W GRAM STAIN (SURGICAL/DEEP WOUND)

## 2023-12-12 DIAGNOSIS — R3914 Feeling of incomplete bladder emptying: Secondary | ICD-10-CM | POA: Diagnosis not present

## 2023-12-13 DIAGNOSIS — M25561 Pain in right knee: Secondary | ICD-10-CM | POA: Diagnosis not present

## 2023-12-13 DIAGNOSIS — M25562 Pain in left knee: Secondary | ICD-10-CM | POA: Diagnosis not present

## 2023-12-14 ENCOUNTER — Inpatient Hospital Stay: Admitting: Internal Medicine

## 2023-12-18 ENCOUNTER — Other Ambulatory Visit (HOSPITAL_COMMUNITY): Payer: Self-pay

## 2023-12-18 MED ORDER — ATORVASTATIN CALCIUM 80 MG PO TABS
80.0000 mg | ORAL_TABLET | Freq: Every day | ORAL | 0 refills | Status: DC
  Filled 2023-12-18 (×2): qty 30, 30d supply, fill #0

## 2023-12-27 ENCOUNTER — Other Ambulatory Visit (HOSPITAL_COMMUNITY): Payer: Self-pay

## 2023-12-27 ENCOUNTER — Other Ambulatory Visit: Payer: Self-pay

## 2023-12-27 DIAGNOSIS — N3000 Acute cystitis without hematuria: Secondary | ICD-10-CM | POA: Diagnosis not present

## 2023-12-27 DIAGNOSIS — N453 Epididymo-orchitis: Secondary | ICD-10-CM | POA: Diagnosis not present

## 2023-12-27 MED ORDER — COLCHICINE 0.6 MG PO CAPS
0.6000 mg | ORAL_CAPSULE | Freq: Every day | ORAL | 0 refills | Status: AC
  Filled 2023-12-27: qty 30, 30d supply, fill #0

## 2023-12-27 MED ORDER — OXYBUTYNIN CHLORIDE ER 5 MG PO TB24
5.0000 mg | ORAL_TABLET | Freq: Every day | ORAL | 3 refills | Status: AC
  Filled 2023-12-27 (×2): qty 30, 30d supply, fill #0
  Filled 2024-01-15 – 2024-02-09 (×2): qty 30, 30d supply, fill #1

## 2023-12-27 MED ORDER — COLCHICINE 0.6 MG PO TABS
0.6000 mg | ORAL_TABLET | Freq: Every day | ORAL | 0 refills | Status: AC
  Filled 2023-12-27 – 2024-01-03 (×5): qty 30, 30d supply, fill #0

## 2023-12-28 ENCOUNTER — Other Ambulatory Visit (HOSPITAL_COMMUNITY): Payer: Self-pay

## 2023-12-29 ENCOUNTER — Other Ambulatory Visit (HOSPITAL_COMMUNITY): Payer: Self-pay

## 2023-12-29 MED ORDER — DOXYCYCLINE HYCLATE 100 MG PO CAPS
100.0000 mg | ORAL_CAPSULE | Freq: Two times a day (BID) | ORAL | 0 refills | Status: AC
  Filled 2023-12-29 (×2): qty 28, 14d supply, fill #0

## 2023-12-30 ENCOUNTER — Other Ambulatory Visit (HOSPITAL_COMMUNITY): Payer: Self-pay

## 2024-01-01 ENCOUNTER — Other Ambulatory Visit: Payer: Self-pay

## 2024-01-03 ENCOUNTER — Other Ambulatory Visit (HOSPITAL_COMMUNITY): Payer: Self-pay

## 2024-01-04 ENCOUNTER — Other Ambulatory Visit (HOSPITAL_COMMUNITY): Payer: Self-pay

## 2024-01-04 ENCOUNTER — Other Ambulatory Visit: Payer: Self-pay

## 2024-01-04 DIAGNOSIS — Z794 Long term (current) use of insulin: Secondary | ICD-10-CM | POA: Diagnosis not present

## 2024-01-04 DIAGNOSIS — E1165 Type 2 diabetes mellitus with hyperglycemia: Secondary | ICD-10-CM | POA: Diagnosis not present

## 2024-01-04 DIAGNOSIS — E781 Pure hyperglyceridemia: Secondary | ICD-10-CM | POA: Diagnosis not present

## 2024-01-04 DIAGNOSIS — Z89429 Acquired absence of other toe(s), unspecified side: Secondary | ICD-10-CM | POA: Diagnosis not present

## 2024-01-04 MED ORDER — INSULIN LISPRO (1 UNIT DIAL) 100 UNIT/ML (KWIKPEN)
20.0000 [IU] | PEN_INJECTOR | Freq: Three times a day (TID) | SUBCUTANEOUS | 4 refills | Status: AC
  Filled 2024-01-04 – 2024-02-15 (×2): qty 54, 90d supply, fill #0
  Filled 2024-05-08: qty 54, 90d supply, fill #1

## 2024-01-04 MED ORDER — TRESIBA FLEXTOUCH 200 UNIT/ML ~~LOC~~ SOPN
120.0000 [IU] | PEN_INJECTOR | Freq: Every day | SUBCUTANEOUS | 4 refills | Status: AC
  Filled 2024-01-04: qty 18, 30d supply, fill #0
  Filled 2024-02-15: qty 18, 30d supply, fill #1
  Filled 2024-03-20: qty 18, 30d supply, fill #2
  Filled 2024-05-08: qty 18, 30d supply, fill #3

## 2024-01-04 MED ORDER — DEXCOM G7 SENSOR MISC
4 refills | Status: AC
  Filled 2024-01-04: qty 9, 90d supply, fill #0
  Filled 2024-01-05: qty 3, 30d supply, fill #0
  Filled 2024-03-13: qty 3, 30d supply, fill #1
  Filled 2024-05-17: qty 3, 30d supply, fill #2
  Filled 2024-06-21: qty 3, 30d supply, fill #3
  Filled 2024-07-04: qty 1, 10d supply, fill #3

## 2024-01-05 ENCOUNTER — Other Ambulatory Visit (HOSPITAL_COMMUNITY): Payer: Self-pay

## 2024-01-08 ENCOUNTER — Other Ambulatory Visit (HOSPITAL_COMMUNITY): Payer: Self-pay

## 2024-01-15 ENCOUNTER — Other Ambulatory Visit (HOSPITAL_COMMUNITY): Payer: Self-pay

## 2024-01-15 MED ORDER — OXYBUTYNIN CHLORIDE ER 15 MG PO TB24
15.0000 mg | ORAL_TABLET | Freq: Every day | ORAL | 11 refills | Status: AC
  Filled 2024-01-15: qty 30, 30d supply, fill #0
  Filled 2024-02-09: qty 30, 30d supply, fill #1

## 2024-01-16 ENCOUNTER — Other Ambulatory Visit (HOSPITAL_COMMUNITY): Payer: Self-pay

## 2024-02-09 ENCOUNTER — Other Ambulatory Visit (HOSPITAL_COMMUNITY): Payer: Self-pay

## 2024-02-12 ENCOUNTER — Other Ambulatory Visit (HOSPITAL_COMMUNITY): Payer: Self-pay

## 2024-02-12 DIAGNOSIS — U071 COVID-19: Secondary | ICD-10-CM | POA: Diagnosis not present

## 2024-02-12 DIAGNOSIS — R051 Acute cough: Secondary | ICD-10-CM | POA: Diagnosis not present

## 2024-02-12 MED ORDER — PAXLOVID (300/100) 20 X 150 MG & 10 X 100MG PO TBPK
ORAL_TABLET | ORAL | 0 refills | Status: AC
  Filled 2024-02-12: qty 30, 30d supply, fill #0

## 2024-02-15 ENCOUNTER — Other Ambulatory Visit (HOSPITAL_COMMUNITY): Payer: Self-pay

## 2024-02-20 ENCOUNTER — Other Ambulatory Visit (HOSPITAL_COMMUNITY): Payer: Self-pay

## 2024-02-20 DIAGNOSIS — N401 Enlarged prostate with lower urinary tract symptoms: Secondary | ICD-10-CM | POA: Diagnosis not present

## 2024-02-20 DIAGNOSIS — N3946 Mixed incontinence: Secondary | ICD-10-CM | POA: Diagnosis not present

## 2024-02-20 DIAGNOSIS — N3020 Other chronic cystitis without hematuria: Secondary | ICD-10-CM | POA: Diagnosis not present

## 2024-02-20 MED ORDER — CEPHALEXIN 500 MG PO CAPS
500.0000 mg | ORAL_CAPSULE | Freq: Three times a day (TID) | ORAL | 0 refills | Status: DC
  Filled 2024-02-20: qty 21, 7d supply, fill #0

## 2024-02-26 ENCOUNTER — Other Ambulatory Visit (HOSPITAL_COMMUNITY): Payer: Self-pay

## 2024-02-26 MED ORDER — CEPHALEXIN 250 MG PO CAPS
250.0000 mg | ORAL_CAPSULE | Freq: Every day | ORAL | 5 refills | Status: AC
  Filled 2024-02-26: qty 30, 30d supply, fill #0

## 2024-03-12 ENCOUNTER — Other Ambulatory Visit (HOSPITAL_COMMUNITY): Payer: Self-pay

## 2024-03-12 MED ORDER — TECHLITE PEN NEEDLES 32G X 6 MM MISC
0 refills | Status: AC
  Filled 2024-03-12: qty 200, 90d supply, fill #0

## 2024-03-13 ENCOUNTER — Other Ambulatory Visit (HOSPITAL_COMMUNITY): Payer: Self-pay

## 2024-03-14 ENCOUNTER — Other Ambulatory Visit (HOSPITAL_COMMUNITY): Payer: Self-pay

## 2024-03-20 ENCOUNTER — Other Ambulatory Visit (HOSPITAL_COMMUNITY): Payer: Self-pay

## 2024-03-29 ENCOUNTER — Encounter (HOSPITAL_COMMUNITY): Payer: Self-pay

## 2024-03-29 ENCOUNTER — Other Ambulatory Visit (HOSPITAL_COMMUNITY): Payer: Self-pay

## 2024-04-03 ENCOUNTER — Other Ambulatory Visit (HOSPITAL_COMMUNITY): Payer: Self-pay

## 2024-04-03 ENCOUNTER — Other Ambulatory Visit: Payer: Self-pay

## 2024-04-03 MED ORDER — ATORVASTATIN CALCIUM 80 MG PO TABS
80.0000 mg | ORAL_TABLET | Freq: Every day | ORAL | 0 refills | Status: AC
  Filled 2024-04-03: qty 30, 30d supply, fill #0

## 2024-04-15 ENCOUNTER — Other Ambulatory Visit (HOSPITAL_COMMUNITY): Payer: Self-pay

## 2024-04-17 ENCOUNTER — Other Ambulatory Visit (HOSPITAL_COMMUNITY): Payer: Self-pay

## 2024-04-17 DIAGNOSIS — E781 Pure hyperglyceridemia: Secondary | ICD-10-CM | POA: Diagnosis not present

## 2024-04-17 DIAGNOSIS — N529 Male erectile dysfunction, unspecified: Secondary | ICD-10-CM | POA: Diagnosis not present

## 2024-04-17 DIAGNOSIS — Z89429 Acquired absence of other toe(s), unspecified side: Secondary | ICD-10-CM | POA: Diagnosis not present

## 2024-04-17 DIAGNOSIS — Z794 Long term (current) use of insulin: Secondary | ICD-10-CM | POA: Diagnosis not present

## 2024-04-17 DIAGNOSIS — E1165 Type 2 diabetes mellitus with hyperglycemia: Secondary | ICD-10-CM | POA: Diagnosis not present

## 2024-04-17 MED ORDER — ATORVASTATIN CALCIUM 80 MG PO TABS
80.0000 mg | ORAL_TABLET | Freq: Every day | ORAL | 4 refills | Status: AC
  Filled 2024-04-17: qty 90, 90d supply, fill #0

## 2024-04-17 MED ORDER — LISINOPRIL 20 MG PO TABS
20.0000 mg | ORAL_TABLET | Freq: Every day | ORAL | 4 refills | Status: AC
  Filled 2024-04-17: qty 90, 90d supply, fill #0

## 2024-04-17 MED ORDER — TADALAFIL 20 MG PO TABS
20.0000 mg | ORAL_TABLET | Freq: Every day | ORAL | 0 refills | Status: AC
  Filled 2024-04-17: qty 6, 6d supply, fill #0

## 2024-04-18 ENCOUNTER — Other Ambulatory Visit (HOSPITAL_COMMUNITY): Payer: Self-pay

## 2024-04-18 DIAGNOSIS — R399 Unspecified symptoms and signs involving the genitourinary system: Secondary | ICD-10-CM | POA: Diagnosis not present

## 2024-04-18 DIAGNOSIS — N401 Enlarged prostate with lower urinary tract symptoms: Secondary | ICD-10-CM | POA: Diagnosis not present

## 2024-04-18 DIAGNOSIS — N3946 Mixed incontinence: Secondary | ICD-10-CM | POA: Diagnosis not present

## 2024-04-18 DIAGNOSIS — R350 Frequency of micturition: Secondary | ICD-10-CM | POA: Diagnosis not present

## 2024-04-19 ENCOUNTER — Other Ambulatory Visit (HOSPITAL_COMMUNITY): Payer: Self-pay

## 2024-04-19 ENCOUNTER — Other Ambulatory Visit: Payer: Self-pay

## 2024-04-19 MED ORDER — GEMTESA 75 MG PO TABS
ORAL_TABLET | ORAL | 3 refills | Status: AC
  Filled 2024-04-19: qty 90, 90d supply, fill #0

## 2024-04-23 ENCOUNTER — Other Ambulatory Visit (HOSPITAL_COMMUNITY): Payer: Self-pay

## 2024-04-23 MED ORDER — CEPHALEXIN 500 MG PO CAPS
500.0000 mg | ORAL_CAPSULE | Freq: Two times a day (BID) | ORAL | 0 refills | Status: AC
  Filled 2024-04-23: qty 10, 5d supply, fill #0

## 2024-04-30 ENCOUNTER — Other Ambulatory Visit (HOSPITAL_COMMUNITY): Payer: Self-pay

## 2024-05-08 ENCOUNTER — Other Ambulatory Visit (HOSPITAL_COMMUNITY): Payer: Self-pay

## 2024-05-09 ENCOUNTER — Other Ambulatory Visit (HOSPITAL_COMMUNITY): Payer: Self-pay

## 2024-05-17 ENCOUNTER — Other Ambulatory Visit (HOSPITAL_COMMUNITY): Payer: Self-pay

## 2024-05-17 MED ORDER — METHYLPREDNISOLONE 4 MG PO TBPK
ORAL_TABLET | ORAL | 0 refills | Status: AC
  Filled 2024-05-17: qty 21, 6d supply, fill #0

## 2024-05-17 MED ORDER — COLCHICINE 0.6 MG PO TABS
0.6000 mg | ORAL_TABLET | Freq: Two times a day (BID) | ORAL | 0 refills | Status: AC
  Filled 2024-05-17: qty 20, 10d supply, fill #0

## 2024-05-21 ENCOUNTER — Other Ambulatory Visit (HOSPITAL_COMMUNITY): Payer: Self-pay

## 2024-05-21 DIAGNOSIS — E1165 Type 2 diabetes mellitus with hyperglycemia: Secondary | ICD-10-CM | POA: Diagnosis not present

## 2024-05-21 DIAGNOSIS — E781 Pure hyperglyceridemia: Secondary | ICD-10-CM | POA: Diagnosis not present

## 2024-05-21 DIAGNOSIS — I10 Essential (primary) hypertension: Secondary | ICD-10-CM | POA: Diagnosis not present

## 2024-05-21 DIAGNOSIS — Z794 Long term (current) use of insulin: Secondary | ICD-10-CM | POA: Diagnosis not present

## 2024-05-21 DIAGNOSIS — Z89429 Acquired absence of other toe(s), unspecified side: Secondary | ICD-10-CM | POA: Diagnosis not present

## 2024-05-21 MED ORDER — FENOFIBRATE 145 MG PO TABS
145.0000 mg | ORAL_TABLET | Freq: Every day | ORAL | 4 refills | Status: AC
  Filled 2024-05-21: qty 90, 90d supply, fill #0

## 2024-05-26 DIAGNOSIS — M25561 Pain in right knee: Secondary | ICD-10-CM | POA: Diagnosis not present

## 2024-05-26 DIAGNOSIS — M25562 Pain in left knee: Secondary | ICD-10-CM | POA: Diagnosis not present

## 2024-05-27 ENCOUNTER — Other Ambulatory Visit (HOSPITAL_COMMUNITY): Payer: Self-pay

## 2024-05-27 IMAGING — CT CT RENAL STONE PROTOCOL
2 of 4 series · 16 of 46 positions shown, 18 images · non-contrast
Comparison: CT June 04, 2010

CLINICAL DATA: Flank pain renal stone suspected.



[Series 2: stone full · axial · 0.91mm/px · z∈[-435,+30]mm · 13 of 103 slices shown, 15 images]
[im 5/103  soft-tissue]
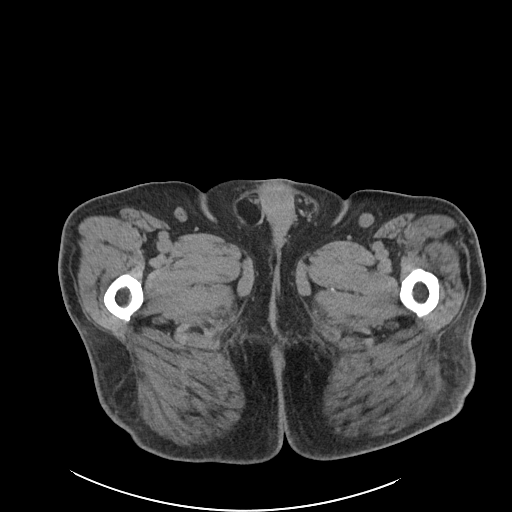
[im 5/103  bone]
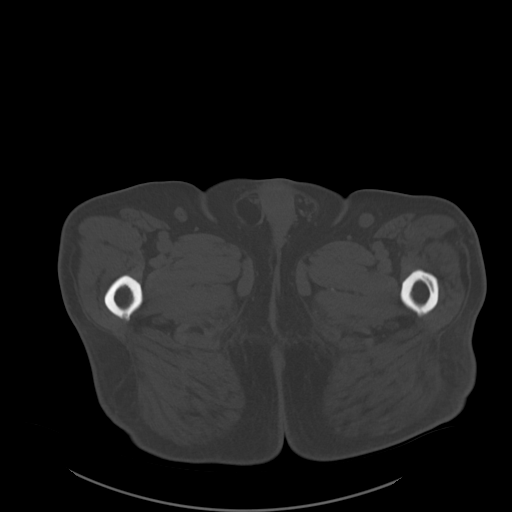
[im 13/103  soft-tissue]
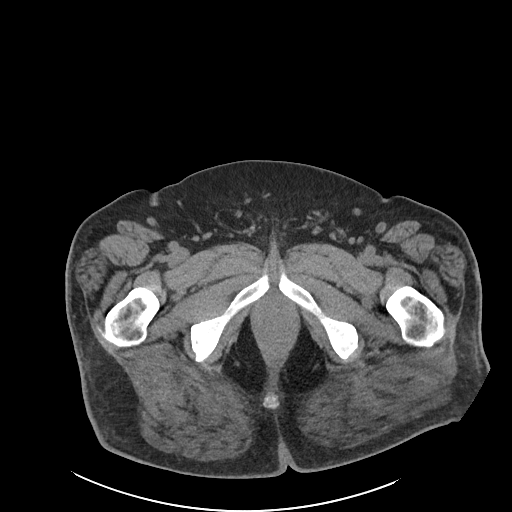
[im 22/103  soft-tissue]
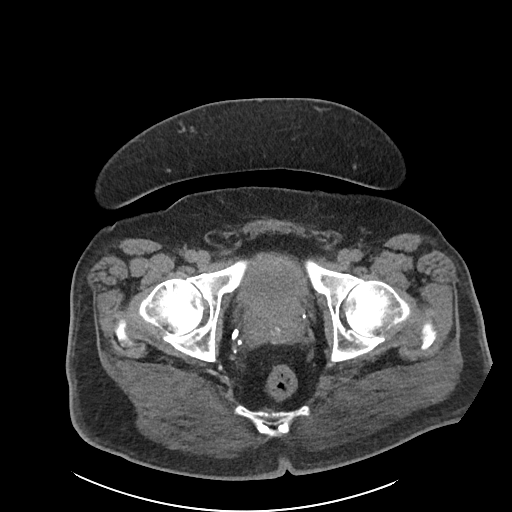
[im 30/103  soft-tissue]
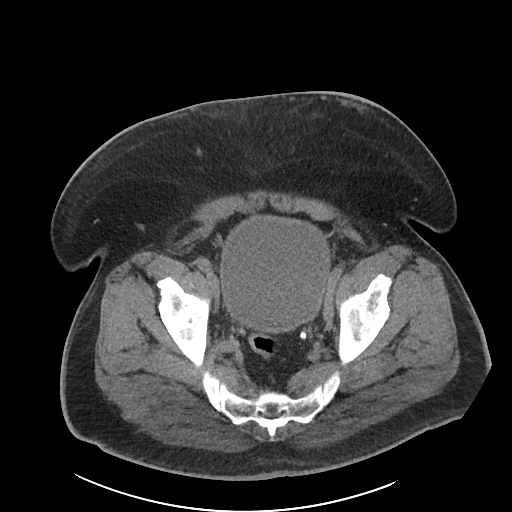
[im 35/103  soft-tissue]
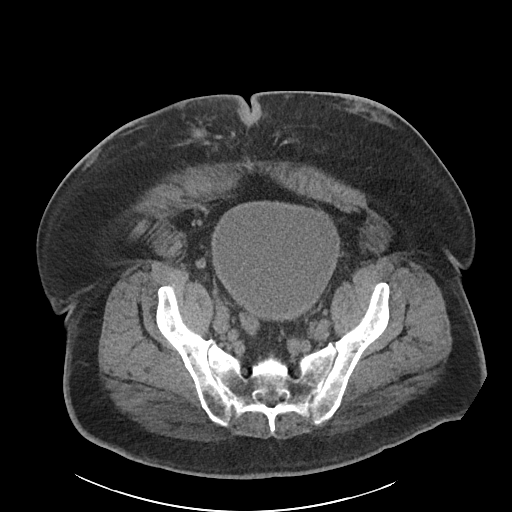
[im 43/103  soft-tissue]
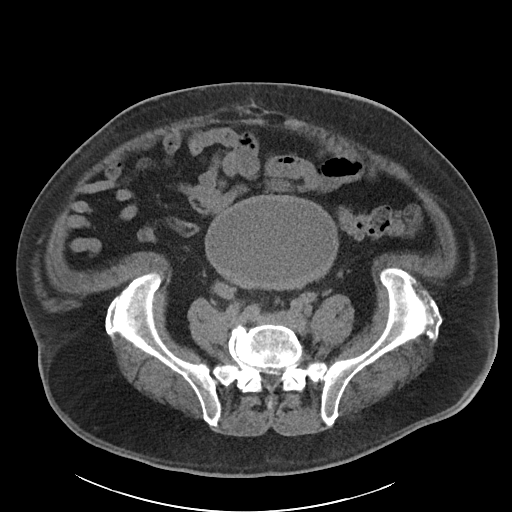
[im 52/103  soft-tissue]
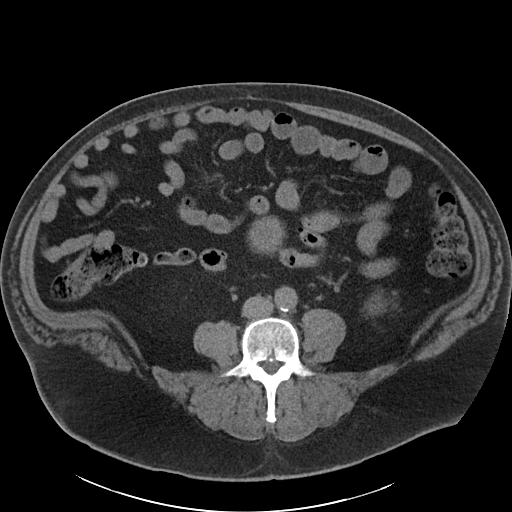
[im 60/103  soft-tissue]
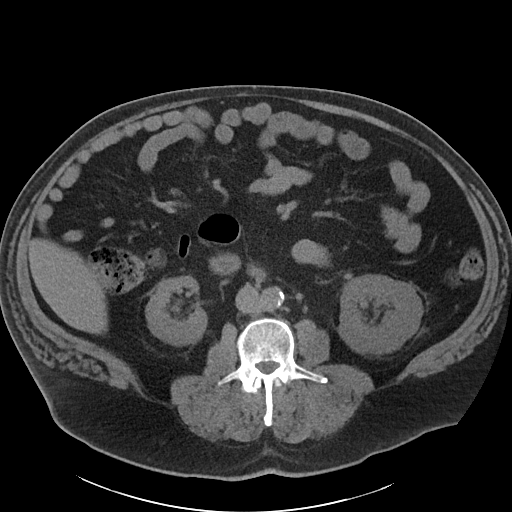
[im 69/103  soft-tissue]
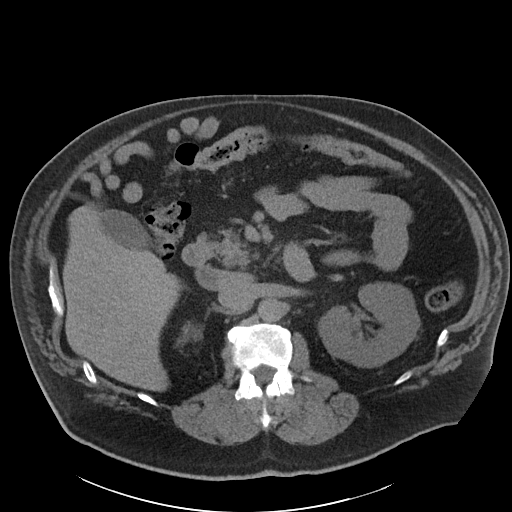
[im 69/103  bone]
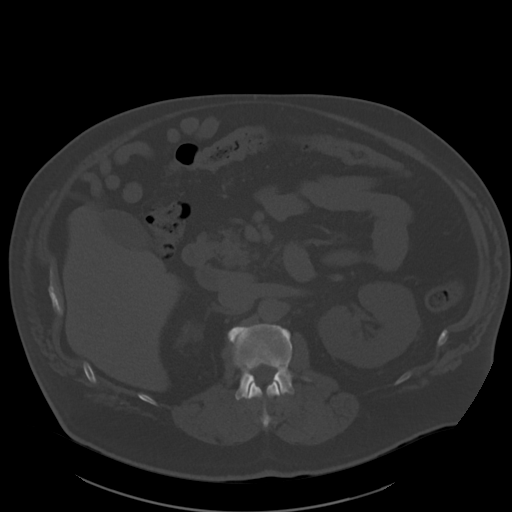
[im 73/103  soft-tissue]
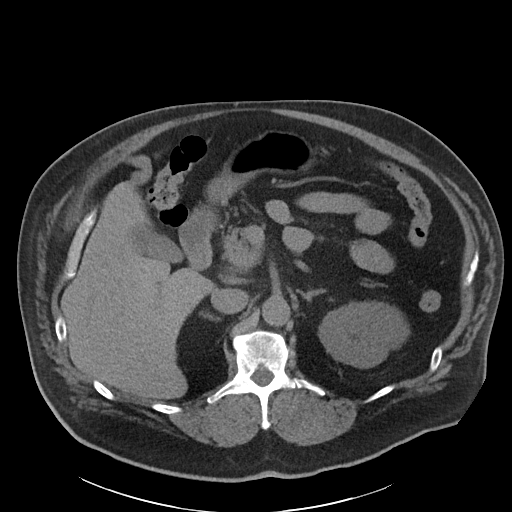
[im 81/103  soft-tissue]
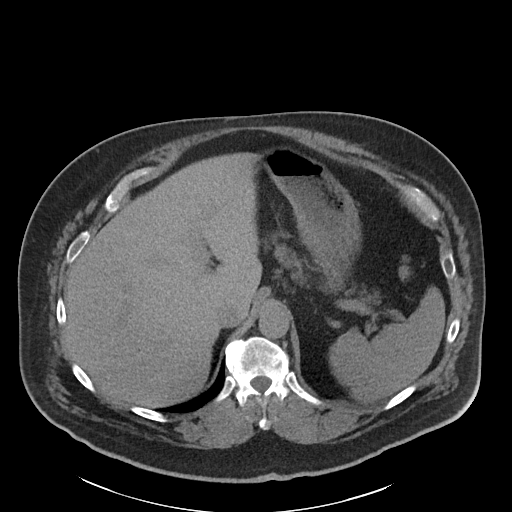
[im 90/103  soft-tissue]
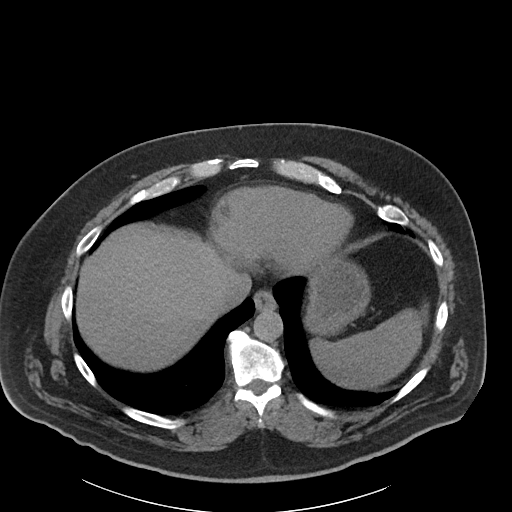
[im 98/103  soft-tissue]
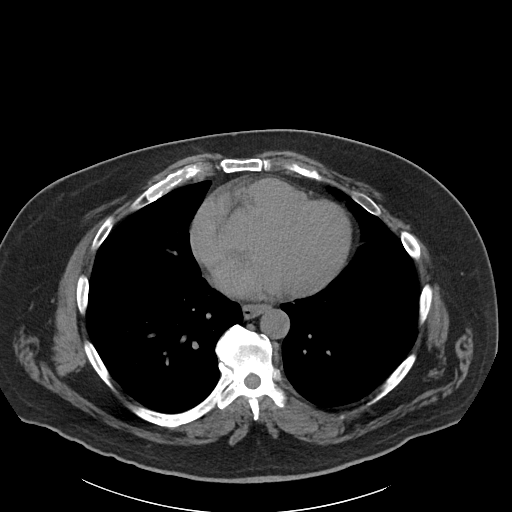

[Series 5: coronal · coronal · 1.01mm/px · 3 of 127 slices shown]
[im 43/127  soft-tissue]
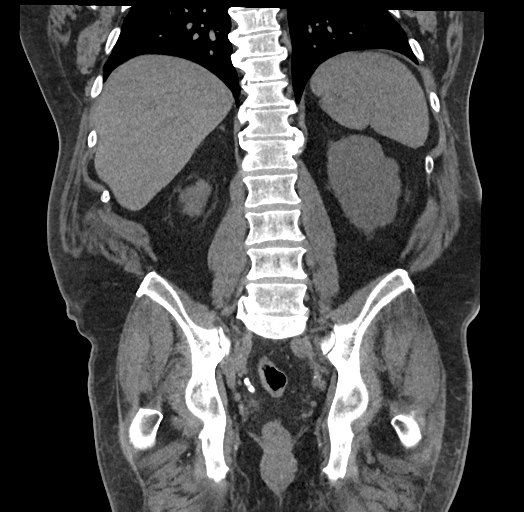
[im 57/127  soft-tissue]
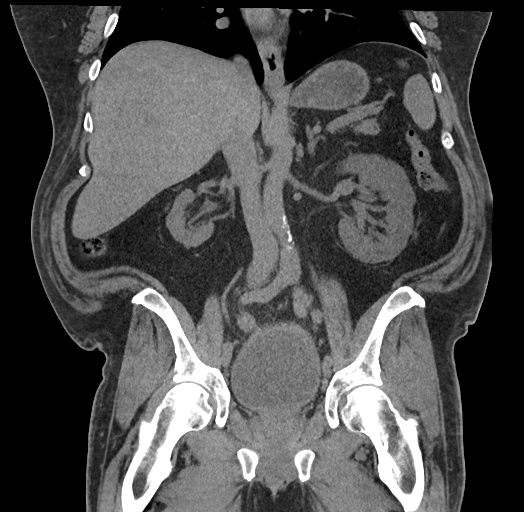
[im 71/127  soft-tissue]
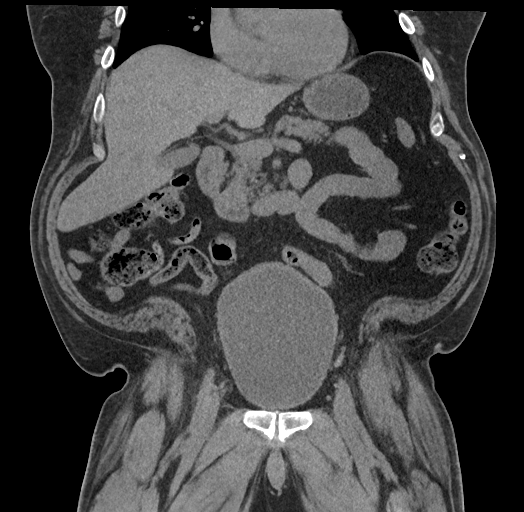

[16 of 46 positions shown; findings below may reference images not displayed]

FINDINGS: Lower chest: No acute abnormality.

Hepatobiliary: Unremarkable noncontrast appearance of the hepatic
parenchyma. Gallbladder is unremarkable. No biliary ductal dilation.

Pancreas: No pancreatic ductal dilation or evidence of acute
inflammation.

Spleen: No splenomegaly or focal splenic lesion.

Adrenals/Urinary Tract: Bilateral adrenal glands appear normal.
Right renal atrophy. No hydronephrosis. No renal, ureteral or
bladder calculi identified. Hypodense 17 mm right interpolar renal
cyst is benign and requires no independent follow-up.

There is some wall thickening of the superior posterior aspect of
the urinary bladder wall which is distended with urine extending
above the sacral promontory.

Stomach/Bowel: No radiopaque enteric contrast material was
administered. Stomach is unremarkable for degree of distension. No
pathologic dilation of small or large bowel. No evidence of acute
bowel inflammation.

Vascular/Lymphatic: Aortic and branch vessel atherosclerosis without
abdominal aortic aneurysm. No pathologically enlarged abdominal or
pelvic lymph nodes.

Reproductive: Enlarged prostate gland. Calcification of the seminal
vesicles.

Other: Soft tissue thickening along the umbilicus with adjacent
inflammatory stranding. Soft tissue nodularity in the anterior
abdominal wall commonly reflect sequela of subcutaneous injections.

Musculoskeletal: No acute osseous abnormality. Multilevel
degenerative changes of the spine.
IMPRESSION: 1. No hydronephrosis.  No renal, ureteral or bladder calculi.
2. Prostatomegaly with mild wall thickening of a distended urinary
bladder which extends above the sacral promontory, possibly
reflecting chronic outflow impedance.
3. Soft tissue thickening along the umbilicus with adjacent
inflammatory stranding possibly reflecting cellulitis recommend
correlation with direct visualization.
4.  Aortic Atherosclerosis (CAU13-CX3.3).

## 2024-05-28 ENCOUNTER — Other Ambulatory Visit (HOSPITAL_COMMUNITY): Payer: Self-pay

## 2024-06-09 ENCOUNTER — Emergency Department (HOSPITAL_BASED_OUTPATIENT_CLINIC_OR_DEPARTMENT_OTHER)
Admission: EM | Admit: 2024-06-09 | Discharge: 2024-06-09 | Disposition: A | Discharge status: Home / Self Care | Attending: Emergency Medicine | Admitting: Emergency Medicine

## 2024-06-09 ENCOUNTER — Other Ambulatory Visit: Payer: Self-pay

## 2024-06-09 ENCOUNTER — Emergency Department (HOSPITAL_BASED_OUTPATIENT_CLINIC_OR_DEPARTMENT_OTHER)

## 2024-06-09 ENCOUNTER — Encounter (HOSPITAL_BASED_OUTPATIENT_CLINIC_OR_DEPARTMENT_OTHER): Payer: Self-pay

## 2024-06-09 DIAGNOSIS — R1084 Generalized abdominal pain: Secondary | ICD-10-CM | POA: Insufficient documentation

## 2024-06-09 DIAGNOSIS — E1142 Type 2 diabetes mellitus with diabetic polyneuropathy: Secondary | ICD-10-CM | POA: Insufficient documentation

## 2024-06-09 DIAGNOSIS — R509 Fever, unspecified: Secondary | ICD-10-CM | POA: Diagnosis not present

## 2024-06-09 DIAGNOSIS — R059 Cough, unspecified: Secondary | ICD-10-CM | POA: Diagnosis not present

## 2024-06-09 DIAGNOSIS — Z794 Long term (current) use of insulin: Secondary | ICD-10-CM | POA: Diagnosis not present

## 2024-06-09 DIAGNOSIS — N12 Tubulo-interstitial nephritis, not specified as acute or chronic: Secondary | ICD-10-CM

## 2024-06-09 DIAGNOSIS — Z79899 Other long term (current) drug therapy: Secondary | ICD-10-CM | POA: Diagnosis not present

## 2024-06-09 DIAGNOSIS — Z7984 Long term (current) use of oral hypoglycemic drugs: Secondary | ICD-10-CM | POA: Diagnosis not present

## 2024-06-09 DIAGNOSIS — I10 Essential (primary) hypertension: Secondary | ICD-10-CM | POA: Insufficient documentation

## 2024-06-09 LAB — CBC WITH DIFFERENTIAL/PLATELET
Abs Immature Granulocytes: 0.09 K/uL — ABNORMAL HIGH (ref 0.00–0.07)
Basophils Absolute: 0 K/uL (ref 0.0–0.1)
Basophils Relative: 0 %
Eosinophils Absolute: 0.1 K/uL (ref 0.0–0.5)
Eosinophils Relative: 1 %
HCT: 37.3 % — ABNORMAL LOW (ref 39.0–52.0)
Hemoglobin: 12.7 g/dL — ABNORMAL LOW (ref 13.0–17.0)
Immature Granulocytes: 1 %
Lymphocytes Relative: 11 %
Lymphs Abs: 1.7 K/uL (ref 0.7–4.0)
MCH: 27.8 pg (ref 26.0–34.0)
MCHC: 34 g/dL (ref 30.0–36.0)
MCV: 81.6 fL (ref 80.0–100.0)
Monocytes Absolute: 1.4 K/uL — ABNORMAL HIGH (ref 0.1–1.0)
Monocytes Relative: 9 %
Neutro Abs: 12.2 K/uL — ABNORMAL HIGH (ref 1.7–7.7)
Neutrophils Relative %: 78 %
Platelets: 252 K/uL (ref 150–400)
RBC: 4.57 MIL/uL (ref 4.22–5.81)
RDW: 12.8 % (ref 11.5–15.5)
WBC: 15.5 K/uL — ABNORMAL HIGH (ref 4.0–10.5)
nRBC: 0 % (ref 0.0–0.2)

## 2024-06-09 LAB — BASIC METABOLIC PANEL WITH GFR
Anion gap: 15 (ref 5–15)
BUN: 17 mg/dL (ref 6–20)
CO2: 22 mmol/L (ref 22–32)
Calcium: 9.4 mg/dL (ref 8.9–10.3)
Chloride: 90 mmol/L — ABNORMAL LOW (ref 98–111)
Creatinine, Ser: 0.9 mg/dL (ref 0.61–1.24)
GFR, Estimated: >60 mL/min
Glucose, Bld: 285 mg/dL — ABNORMAL HIGH (ref 70–99)
Potassium: 4.2 mmol/L (ref 3.5–5.1)
Sodium: 127 mmol/L — ABNORMAL LOW (ref 135–145)

## 2024-06-09 LAB — URINALYSIS, W/ REFLEX TO CULTURE (INFECTION SUSPECTED)
Glucose, UA: NEGATIVE mg/dL
Ketones, ur: NEGATIVE mg/dL
Nitrite: NEGATIVE
Protein, ur: 100 mg/dL — AB
Specific Gravity, Urine: 1.025 (ref 1.005–1.030)
WBC, UA: >50 WBC/hpf (ref 0–5)
pH: 5.5 (ref 5.0–8.0)

## 2024-06-09 LAB — HEPATIC FUNCTION PANEL
ALT: 16 U/L (ref 0–44)
AST: 37 U/L (ref 15–41)
Albumin: 3.4 g/dL — ABNORMAL LOW (ref 3.5–5.0)
Alkaline Phosphatase: 211 U/L — ABNORMAL HIGH (ref 38–126)
Bilirubin, Direct: 0.3 mg/dL — ABNORMAL HIGH (ref 0.0–0.2)
Indirect Bilirubin: 0.5 mg/dL (ref 0.3–0.9)
Total Bilirubin: 0.8 mg/dL (ref 0.0–1.2)
Total Protein: 7.5 g/dL (ref 6.5–8.1)

## 2024-06-09 LAB — LIPASE, BLOOD: Lipase: 27 U/L (ref 11–51)

## 2024-06-09 MED ORDER — ONDANSETRON HCL 4 MG/2ML IJ SOLN
4.0000 mg | Freq: Once | INTRAMUSCULAR | Status: AC
  Administered 2024-06-09: 4 mg via INTRAVENOUS
  Filled 2024-06-09: qty 2

## 2024-06-09 MED ORDER — IOHEXOL 300 MG/ML  SOLN
100.0000 mL | Freq: Once | INTRAMUSCULAR | Status: AC | PRN
  Administered 2024-06-09: 100 mL via INTRAVENOUS

## 2024-06-09 MED ORDER — SODIUM CHLORIDE 0.9 % IV SOLN
1.0000 g | Freq: Once | INTRAVENOUS | Status: AC
  Administered 2024-06-09: 1 g via INTRAVENOUS
  Filled 2024-06-09: qty 10

## 2024-06-09 MED ORDER — ONDANSETRON HCL 4 MG PO TABS: 4.0000 mg | ORAL_TABLET | Freq: Four times a day (QID) | ORAL | 0 refills | Status: AC

## 2024-06-09 MED ORDER — SULFAMETHOXAZOLE-TRIMETHOPRIM 800-160 MG PO TABS: 1.0000 | ORAL_TABLET | Freq: Two times a day (BID) | ORAL | 0 refills | Status: AC

## 2024-06-09 NOTE — Discharge Instructions (Addendum)
 While you are in the emergency room, you had blood work done, urine sample, chest x-ray and a CT scan of your abdomen pelvis.  You have an infection in your bladder that is try to make its way up to your kidney.  We are treating you with antibiotics.  You received a dose of antibiotics here in the emergency room.  I have sent a prescription for another antibiotic to the pharmacy.  You can take Bactrim  once in the morning once in the evening for the next 1 week.  Return to the emergency room if you develop worsening fever, if you are unable to keep the medicine down, confusion, or feel as though you are getting worse.  Typically her symptoms will begin to improve in 24 to 48 hours.  Follow-up with your primary care doctor.  After your symptoms have improved, you need to have repeat imaging of your kidney to make sure it looks improved after the infection is cleared.

## 2024-06-09 NOTE — ED Provider Notes (Signed)
 " [place] [facility] Provider Note  CSN: [id number] Arrival date & time: 06/09/24 1409  Chief Complaint(s) Cough and Fever  HPI Martin [name] is a 59 y.o. male who is here today for generalized abdominal discomfort, fevers at home, cough for the last 4 days.  States he has not had much of an appetite.  He denies sore throat or nasal congestion.   Past Medical History Past Medical History:  Diagnosis Date   Arthritis    Chest pain    Diabetes mellitus    Gout    History of kidney stones    Hyperlipidemia    Hypertension    Obesity    Patient Active Problem List   Diagnosis Date Noted   Testicular abscess 12/01/2023   Prostate abscess 12/01/2023   Epididymoorchitis 12/01/2023   Nasal abscess 08/11/2023   Sepsis without acute organ dysfunction (HCC) 06/02/2023   Abscess of neck 06/02/2023   Hyponatremia 06/01/2023   Cellulitis of neck 05/30/2023   Insulin  dependent type 2 diabetes mellitus (HCC) 05/30/2023   Parotid abscess 10/18/2022   Diabetic peripheral neuropathy (HCC) 10/18/2022   Neck abscess 10/17/2022   Enterococcus faecalis infection 05/03/2021   Open wound of toe 05/03/2021   Health maintenance examination 03/16/2021   Need for vaccination 03/16/2021   Osteomyelitis of great toe of right foot (HCC) 01/20/2021   Acute idiopathic gout of right ankle 12/14/2018   Bilateral hand swelling 01/31/2018   Left leg swelling 01/31/2018   Low grade fever 01/31/2018   Tooth abscess 01/31/2018   Uncontrolled type 2 diabetes mellitus with hyperglycemia, with long-term current use of insulin  (HCC) 10/05/2015   Hypertension associated with diabetes (HCC) 10/05/2015   Obesity 10/05/2015   Chest pain 12/19/2014   Hypertriglyceridemia 08/20/2012   Gout 07/25/2012   Essential hypertension, benign 07/25/2012   Hyperlipidemia associated with type 2 diabetes mellitus (HCC) 07/25/2012   Glaucoma 07/25/2012   Home Medication(s) Prior to  Admission medications  Medication Sig Start Date End Date Taking? Authorizing Provider  amLODipine  (NORVASC ) 5 MG tablet Take 1 tablet (5 mg total) by mouth daily. 12/07/23   Drusilla Sabas RAMAN, MD  atorvastatin  (LIPITOR) 80 MG tablet Take 1 tablet (80 mg total) by mouth at bedtime. Patient needs appiontment for further refills. 04/03/24     atorvastatin  (LIPITOR) 80 MG tablet Take 1 tablet (80 mg total) by mouth daily. 04/17/24     cephALEXin  (KEFLEX ) 250 MG capsule Take 1 capsule (250 mg total) by mouth at bedtime. 02/26/24     cephALEXin  (KEFLEX ) 500 MG capsule Take 1 capsule (500 mg total) by mouth 2 (two) times daily. 04/22/24     Colchicine  0.6 MG CAPS Take 1 capsule (0.6 mg total) by mouth daily. 12/27/23     colchicine  0.6 MG tablet Take 1 tablet (0.6 mg total) by mouth 2 (two) times daily. 05/17/24     colchicine  0.6 MG tablet Take 1 tablet (0.6 mg total) by mouth daily. 12/27/23     Continuous Blood Gluc Sensor (DEXCOM G7 SENSOR) MISC Change 1 sensor every 10 days on insulin  3 times daily Patient not taking: Reported on 12/01/2023 01/13/22     Continuous Glucose Receiver (FREESTYLE LIBRE 3 READER) DEVI Use 4 (four) times daily. Patient not taking: Reported on 12/01/2023 11/23/22     Continuous Glucose Sensor (DEXCOM G7 SENSOR) MISC Use to check blood sugar change every 10 days. 01/04/24     Continuous Glucose Sensor (FREESTYLE LIBRE 3 SENSOR)  MISC Change sensor every 14 days. Patient not taking: Reported on 12/01/2023 11/23/22     diclofenac  Sodium (VOLTAREN ) 1 % GEL Apply 2 g topically 4 (four) times daily as needed. Apply to knees 12/06/23   Drusilla Sabas RAMAN, MD  doxycycline  (VIBRAMYCIN ) 100 MG capsule Take 1 capsule (100 mg total) by mouth 2 (two) times daily. 12/29/23     empagliflozin  (JARDIANCE ) 25 MG TABS tablet Take 1 tablet (25 mg total) by mouth daily. 10/23/23     fenofibrate  (TRICOR ) 145 MG tablet Take 1 tablet (145 mg total) by mouth daily. 05/21/24     finasteride  (PROSCAR ) 5 MG tablet Take 1  (one) tablet by mouth daily. 08/14/23     glucose blood (FREESTYLE LITE) test strip Use three times daily as directed 03/09/21     insulin  degludec (TRESIBA  FLEXTOUCH) 200 UNIT/ML FlexTouch Pen Inject 110 Units into the skin daily. 10/20/22   Tobie Yetta HERO, MD  insulin  degludec (TRESIBA  FLEXTOUCH) 200 UNIT/ML FlexTouch Pen Inject 120 Units into the skin daily. 01/04/24     insulin  lispro (HUMALOG  KWIKPEN) 100 UNIT/ML KwikPen Inject 20 units at beginning of each meal Subcutaneous up to three times a day 01/04/24     Insulin  Pen Needle (TECHLITE PEN NEEDLES) 32G X 6 MM MISC Use to inject insulin  2 (two) times daily. 03/12/24     Insulin  Pen Needle 32G X 6 MM MISC Use to inject insulin  2 times a day for 90 days. 12/24/21   [name] Purchase, MD  Lancets (FREESTYLE) lancets Use three times daily as directed 03/09/21   [name] Purchase, MD  lisinopril  (ZESTRIL ) 20 MG tablet Take 1 tablet (20 mg total) by mouth daily. 12/07/23   Drusilla Sabas RAMAN, MD  lisinopril  (ZESTRIL ) 20 MG tablet Take 1 tablet (20 mg total) by mouth daily. 04/17/24     metFORMIN  (GLUCOPHAGE -XR) 500 MG 24 hr tablet Take 2 tablets (1,000 mg total) by mouth 2 (two) times daily with a meal. 11/23/22     methylPREDNISolone  (MEDROL  DOSEPAK) 4 MG TBPK tablet Take As Directed On Package 05/17/24     nirmatrelvir /ritonavir  (PAXLOVID , 300/100,) 20 x 150 MG & 10 x 100MG  TBPK Take 3 tablets by mouth 2 (two) times a day for 5 days. 02/12/24     oxybutynin  (DITROPAN  XL) 15 MG 24 hr tablet Take 1 tablet (15 mg total) by mouth daily. 01/15/24     oxybutynin  (DITROPAN -XL) 5 MG 24 hr tablet Take 1 tablet (5 mg total) by mouth daily. 12/27/23     oxyCODONE  (OXY IR/ROXICODONE ) 5 MG immediate release tablet Take 1 tablet (5 mg total) by mouth every 6 (six) hours as needed for moderate pain (pain score 4-6). 12/06/23   Drusilla Sabas RAMAN, MD  senna-docusate (SENOKOT-S) 8.6-50 MG tablet Take 1 tablet by mouth at bedtime as needed for mild constipation. 12/06/23   Drusilla Sabas RAMAN, MD   tadalafil  (CIALIS ) 20 MG tablet Take 1 tablet (20 mg total) by mouth one hour before sex as needed for E.D. 03/07/23     tamsulosin  (FLOMAX ) 0.4 MG CAPS capsule Take 1 capsule (0.4 mg total) by mouth daily. 12/07/23   Drusilla Sabas RAMAN, MD  Vibegron  (GEMTESA ) 75 MG TABS Take 1 tablet orally daily. 04/18/24  Past Surgical History Past Surgical History:  Procedure Laterality Date   AMPUTATION TOE Right 01/23/2021   Procedure: AMPUTATION OF HALLUX;  Surgeon: Gershon Donnice SAUNDERS, DPM;  Location: [facility];  Service: Podiatry;  Laterality: Right;   AMPUTATION TOE Left 07/29/2022   Procedure: AMPUTATION TOE FIRST;  Surgeon: Gershon Donnice SAUNDERS, DPM;  Location: [facility];  Service: Podiatry;  Laterality: Left;   TRANSURETHRAL RESECTION OF PROSTATE N/A 12/02/2023   Procedure: CYSTOSCOPY WITH TURP (TRANSURETHRAL RESECTION OF PROSTATE) /ABSCESS UNROOFING;  Surgeon: Alvaro Ricardo KATHEE Mickey., MD;  Location: [facility];  Service: Urology;  Laterality: N/A;   UMBILICAL HERNIA REPAIR     Family History Family History  Problem Relation Age of Onset   Diabetes Maternal Grandfather    Kidney disease Other     Social History Social History[1] Allergies Patient has no known allergies.  Review of Systems Review of Systems  Physical Exam Vital Signs  I have reviewed the triage vital signs BP 103/81 (BP Location: Right Arm)   Pulse 84   Temp 98.1 F (36.7 C) (Oral)   Resp (!) 30   Ht 5' 11 (1.803 m)   Wt 102.1 kg   SpO2 98%   BMI 31.38 kg/m   Physical Exam Vitals and nursing note reviewed.  Constitutional:      Appearance: Normal appearance.  HENT:     Head: Normocephalic and atraumatic.  Eyes:     Pupils: Pupils are equal, round, and reactive to light.  Cardiovascular:     Rate and Rhythm: Normal rate.  Pulmonary:     Effort: Pulmonary effort is normal.  Abdominal:      General: Abdomen is flat. There is no distension.     Palpations: Abdomen is soft.     Tenderness: There is no abdominal tenderness. There is no guarding.  Musculoskeletal:        General: Normal range of motion.  Skin:    General: Skin is warm and dry.  Neurological:     General: No focal deficit present.     Mental Status: He is alert.     ED Results and Treatments Labs (all labs ordered are listed, but only abnormal results are displayed) Labs Reviewed  CBC WITH DIFFERENTIAL/PLATELET - Abnormal; Notable for the following components:      Result Value   WBC 15.5 (*)    Hemoglobin 12.7 (*)    HCT 37.3 (*)    Neutro Abs 12.2 (*)    Monocytes Absolute 1.4 (*)    Abs Immature Granulocytes 0.09 (*)    All other components within normal limits  BASIC METABOLIC PANEL WITH GFR - Abnormal; Notable for the following components:   Sodium 127 (*)    Chloride 90 (*)    Glucose, Bld 285 (*)    All other components within normal limits  HEPATIC FUNCTION PANEL - Abnormal; Notable for the following components:   Albumin 3.4 (*)    Alkaline Phosphatase 211 (*)    Bilirubin, Direct 0.3 (*)    All other components within normal limits  LIPASE, BLOOD  URINALYSIS, W/ REFLEX TO CULTURE (INFECTION SUSPECTED)  Radiology DG Chest 2 View Result Date: 06/09/2024 CLINICAL DATA:  Cough, fever, nausea, and vomiting. EXAM: CHEST - 2 VIEW COMPARISON:  None Available. FINDINGS: The heart size and mediastinal contours are within normal limits. No consolidation, effusion, or pneumothorax is seen. Degenerative changes are present in the thoracic spine. No acute osseous abnormality. IMPRESSION: No active cardiopulmonary disease. Electronically Signed   By: Leita Birmingham M.D.   On: 06/09/2024 14:32    Pertinent labs & imaging results that were available during my care of the patient were  reviewed by me and considered in my medical decision making (see MDM for details).  Medications Ordered in ED Medications - No data to display                                                                                                                                   Procedures Procedures  (including critical care time)  Medical Decision Making / ED Course   This patient presents to the ED for concern of fever, decreased appetite, vomiting, this involves an extensive number of treatment options, and is a complaint that carries with it a high risk of complications and morbidity.  The differential diagnosis includes enteritis, gastritis, intra-abdominal infection, diverticular disease, less likely sepsis, viral syndrome, appendicitis, biliary disease.  MDM: Patient has normal vital signs.  He has no point tenderness in his abdomen, however he has been experience vomiting and fever.  Given age and symptoms, will obtain CT imaging of patient's abdomen pelvis.  Fever seems inconsistent with ACS.  Reassessment 5:55 PM-urinalysis consistent with cystitis, question of possible pyelonephritis on CT imaging versus mass.  Will treat patient's pyelonephritis.  He received his first dose of Rocephin  here in the ED.  Will discharge with prescription for Bactrim .  Discussed importance of follow-up imaging of the patient's kidney following his pyelonephritis.  Will discharge patient.  This patient does not have sepsis.  Additional history obtained: -Additional history obtained from wife at bedside -External records from outside source obtained and reviewed including: Chart review including previous notes, labs, imaging, consultation notes   Lab Tests: -I ordered, reviewed, and interpreted labs.   The pertinent results include:   Labs Reviewed  CBC WITH DIFFERENTIAL/PLATELET - Abnormal; Notable for the following components:      Result Value   WBC 15.5 (*)    Hemoglobin 12.7 (*)    HCT 37.3  (*)    Neutro Abs 12.2 (*)    Monocytes Absolute 1.4 (*)    Abs Immature Granulocytes 0.09 (*)    All other components within normal limits  BASIC METABOLIC PANEL WITH GFR - Abnormal; Notable for the following components:   Sodium 127 (*)    Chloride 90 (*)    Glucose, Bld 285 (*)    All other components within normal limits  HEPATIC FUNCTION PANEL - Abnormal; Notable for the following components:   Albumin 3.4 (*)  Alkaline Phosphatase 211 (*)    Bilirubin, Direct 0.3 (*)    All other components within normal limits  LIPASE, BLOOD  URINALYSIS, W/ REFLEX TO CULTURE (INFECTION SUSPECTED)      Imaging Studies ordered: I ordered imaging studies including chest x-ray, CT abdomen pelvis I independently visualized and interpreted imaging. I agree with the radiologist interpretation   Medicines ordered and prescription drug management: No orders of the defined types were placed in this encounter.   -I have reviewed the patients home medicines and have made adjustments as needed    Cardiac Monitoring: The patient was maintained on a cardiac monitor.  I personally viewed and interpreted the cardiac monitored which showed an underlying rhythm of: Normal sinus rhythm  Social Determinants of Health:  Factors impacting patients care include: Lack of access to primary care   Reevaluation: After the interventions noted above, I reevaluated the patient and found that they have :improved  Co morbidities that complicate the patient evaluation  Past Medical History:  Diagnosis Date   Arthritis    Chest pain    Diabetes mellitus    Gout    History of kidney stones    Hyperlipidemia    Hypertension    Obesity       Dispostion: I considered admission for this patient, however given his reassuring workup and exam he is appropriate for discharge.     Final Clinical Impression(s) / ED Diagnoses Final diagnoses:  None     @PCDICTATION @     [1]  Social  History Tobacco Use   Smoking status: Never   Smokeless tobacco: Current    Types: Chew  Vaping Use   Vaping status: Never Used  Substance Use Topics   Alcohol use: Yes    Alcohol/week: 0.0 standard drinks of alcohol    Comment: occ   Drug use: No     Mannie Pac T, DO 06/09/24 1757  "

## 2024-06-09 NOTE — ED Triage Notes (Signed)
 Pt reports cough, fever, N/V x4 days.

## 2024-06-09 NOTE — ED Notes (Signed)
 Patient transported to CT

## 2024-06-11 ENCOUNTER — Other Ambulatory Visit (HOSPITAL_COMMUNITY): Payer: Self-pay

## 2024-06-11 MED ORDER — CEFDINIR 300 MG PO CAPS
300.0000 mg | ORAL_CAPSULE | Freq: Two times a day (BID) | ORAL | 0 refills | Status: DC
  Filled 2024-06-11: qty 28, 14d supply, fill #0

## 2024-06-13 ENCOUNTER — Other Ambulatory Visit (HOSPITAL_COMMUNITY): Payer: Self-pay

## 2024-06-14 ENCOUNTER — Other Ambulatory Visit (HOSPITAL_COMMUNITY): Payer: Self-pay

## 2024-06-17 ENCOUNTER — Other Ambulatory Visit (HOSPITAL_COMMUNITY): Payer: Self-pay

## 2024-06-17 MED ORDER — CEFDINIR 300 MG PO CAPS
300.0000 mg | ORAL_CAPSULE | Freq: Two times a day (BID) | ORAL | 0 refills | Status: AC
  Filled 2024-06-17: qty 28, 14d supply, fill #0

## 2024-06-18 ENCOUNTER — Other Ambulatory Visit: Payer: Self-pay | Admitting: Urology

## 2024-06-18 ENCOUNTER — Other Ambulatory Visit (HOSPITAL_COMMUNITY): Payer: Self-pay

## 2024-06-18 DIAGNOSIS — N10 Acute tubulo-interstitial nephritis: Secondary | ICD-10-CM

## 2024-06-21 ENCOUNTER — Other Ambulatory Visit (HOSPITAL_COMMUNITY): Payer: Self-pay

## 2024-06-21 ENCOUNTER — Encounter (HOSPITAL_COMMUNITY): Payer: Self-pay

## 2024-06-21 MED ORDER — DEXCOM G7 15 DAY SENSOR MISC
12 refills | Status: AC
  Filled 2024-06-21: qty 2, 30d supply, fill #0
  Filled 2024-07-09: qty 1, 15d supply, fill #1
  Filled 2024-07-10: qty 1, 15d supply, fill #2

## 2024-06-21 MED ORDER — LANTUS SOLOSTAR 100 UNIT/ML ~~LOC~~ SOPN
120.0000 [IU] | PEN_INJECTOR | Freq: Every day | SUBCUTANEOUS | 12 refills | Status: AC
  Filled 2024-06-21: qty 36, 30d supply, fill #0

## 2024-06-21 MED ORDER — DEXCOM G7 RECEIVER DEVI
0 refills | Status: AC
  Filled 2024-06-21: qty 1, 30d supply, fill #0

## 2024-06-24 ENCOUNTER — Other Ambulatory Visit (HOSPITAL_COMMUNITY): Payer: Self-pay

## 2024-06-24 ENCOUNTER — Other Ambulatory Visit: Payer: Self-pay

## 2024-06-25 ENCOUNTER — Other Ambulatory Visit (HOSPITAL_COMMUNITY): Payer: Self-pay

## 2024-06-27 ENCOUNTER — Other Ambulatory Visit (HOSPITAL_COMMUNITY): Payer: Self-pay

## 2024-06-27 MED ORDER — METFORMIN HCL ER 500 MG PO TB24
1000.0000 mg | ORAL_TABLET | Freq: Two times a day (BID) | ORAL | 4 refills | Status: AC
  Filled 2024-06-27: qty 120, 30d supply, fill #0

## 2024-06-28 ENCOUNTER — Other Ambulatory Visit (HOSPITAL_COMMUNITY): Payer: Self-pay

## 2024-07-04 ENCOUNTER — Other Ambulatory Visit (HOSPITAL_COMMUNITY): Payer: Self-pay

## 2024-07-04 ENCOUNTER — Other Ambulatory Visit: Payer: Self-pay | Admitting: Urology

## 2024-07-05 ENCOUNTER — Other Ambulatory Visit (HOSPITAL_COMMUNITY): Payer: Self-pay

## 2024-07-05 MED ORDER — CEFDINIR 300 MG PO CAPS
300.0000 mg | ORAL_CAPSULE | Freq: Two times a day (BID) | ORAL | 0 refills | Status: AC
  Filled 2024-07-05: qty 28, 14d supply, fill #0

## 2024-07-08 ENCOUNTER — Other Ambulatory Visit (HOSPITAL_COMMUNITY): Payer: Self-pay

## 2024-07-09 ENCOUNTER — Other Ambulatory Visit (HOSPITAL_COMMUNITY): Payer: Self-pay

## 2024-07-10 ENCOUNTER — Other Ambulatory Visit (HOSPITAL_COMMUNITY): Payer: Self-pay

## 2024-07-12 ENCOUNTER — Other Ambulatory Visit

## 2024-07-26 ENCOUNTER — Other Ambulatory Visit
